# Patient Record
Sex: Male | Born: 1950
Health system: Southern US, Community
[De-identification: ages and names within clinical notes are randomized; demographics above are authoritative.]

## PROBLEM LIST (undated history)

## (undated) DIAGNOSIS — I10 Essential (primary) hypertension: Secondary | ICD-10-CM

## (undated) DIAGNOSIS — K5790 Diverticulosis of intestine, part unspecified, without perforation or abscess without bleeding: Secondary | ICD-10-CM

## (undated) DIAGNOSIS — K219 Gastro-esophageal reflux disease without esophagitis: Secondary | ICD-10-CM

## (undated) DIAGNOSIS — IMO0002 Reserved for concepts with insufficient information to code with codable children: Secondary | ICD-10-CM

## (undated) DIAGNOSIS — K559 Vascular disorder of intestine, unspecified: Secondary | ICD-10-CM

## (undated) DIAGNOSIS — B351 Tinea unguium: Secondary | ICD-10-CM

## (undated) DIAGNOSIS — R002 Palpitations: Secondary | ICD-10-CM

## (undated) DIAGNOSIS — I2510 Atherosclerotic heart disease of native coronary artery without angina pectoris: Secondary | ICD-10-CM

## (undated) DIAGNOSIS — E785 Hyperlipidemia, unspecified: Secondary | ICD-10-CM

## (undated) DIAGNOSIS — E663 Overweight: Secondary | ICD-10-CM

## (undated) DIAGNOSIS — R011 Cardiac murmur, unspecified: Secondary | ICD-10-CM

## (undated) DIAGNOSIS — K635 Polyp of colon: Secondary | ICD-10-CM

## (undated) DIAGNOSIS — R9430 Abnormal result of cardiovascular function study, unspecified: Secondary | ICD-10-CM

## (undated) DIAGNOSIS — M1990 Unspecified osteoarthritis, unspecified site: Secondary | ICD-10-CM

## (undated) DIAGNOSIS — Z5189 Encounter for other specified aftercare: Secondary | ICD-10-CM

## (undated) DIAGNOSIS — I340 Nonrheumatic mitral (valve) insufficiency: Secondary | ICD-10-CM

## (undated) HISTORY — DX: Hyperlipidemia, unspecified: E78.5

## (undated) HISTORY — DX: Reserved for concepts with insufficient information to code with codable children: IMO0002

## (undated) HISTORY — DX: Tinea unguium: B35.1

## (undated) HISTORY — PX: CARDIAC CATHETERIZATION: SHX172

## (undated) HISTORY — DX: Gastro-esophageal reflux disease without esophagitis: K21.9

## (undated) HISTORY — DX: Diverticulosis of intestine, part unspecified, without perforation or abscess without bleeding: K57.90

## (undated) HISTORY — DX: Encounter for other specified aftercare: Z51.89

## (undated) HISTORY — DX: Unspecified osteoarthritis, unspecified site: M19.90

## (undated) HISTORY — DX: Polyp of colon: K63.5

## (undated) HISTORY — DX: Cardiac murmur, unspecified: R01.1

## (undated) HISTORY — DX: Vascular disorder of intestine, unspecified: K55.9

## (undated) HISTORY — DX: Essential (primary) hypertension: I10

## (undated) HISTORY — DX: Abnormal result of cardiovascular function study, unspecified: R94.30

## (undated) HISTORY — DX: Atherosclerotic heart disease of native coronary artery without angina pectoris: I25.10

## (undated) HISTORY — DX: Overweight: E66.3

## (undated) HISTORY — DX: Nonrheumatic mitral (valve) insufficiency: I34.0

## (undated) HISTORY — DX: Palpitations: R00.2

---

## 2002-09-09 ENCOUNTER — Ambulatory Visit (HOSPITAL_COMMUNITY): Admission: AD | Admit: 2002-09-09 | Discharge: 2002-09-10 | Payer: Self-pay | Admitting: Cardiovascular Disease

## 2002-09-09 HISTORY — PX: ANGIOPLASTY / STENTING FEMORAL: SUR30

## 2002-11-02 ENCOUNTER — Encounter (INDEPENDENT_AMBULATORY_CARE_PROVIDER_SITE_OTHER): Payer: Self-pay | Admitting: Specialist

## 2002-11-02 ENCOUNTER — Ambulatory Visit (HOSPITAL_COMMUNITY): Admission: RE | Admit: 2002-11-02 | Discharge: 2002-11-02 | Payer: Self-pay | Admitting: Gastroenterology

## 2004-04-25 ENCOUNTER — Ambulatory Visit: Payer: Self-pay | Admitting: Pulmonary Disease

## 2004-05-02 ENCOUNTER — Encounter: Admission: RE | Admit: 2004-05-02 | Discharge: 2004-05-02 | Payer: Self-pay | Admitting: Orthopaedic Surgery

## 2004-11-05 ENCOUNTER — Inpatient Hospital Stay (HOSPITAL_COMMUNITY): Admission: EM | Admit: 2004-11-05 | Discharge: 2004-11-07 | Payer: Self-pay | Admitting: *Deleted

## 2004-11-06 ENCOUNTER — Ambulatory Visit: Payer: Self-pay | Admitting: Gastroenterology

## 2004-11-06 ENCOUNTER — Encounter (INDEPENDENT_AMBULATORY_CARE_PROVIDER_SITE_OTHER): Payer: Self-pay | Admitting: *Deleted

## 2004-11-07 ENCOUNTER — Ambulatory Visit: Payer: Self-pay | Admitting: Pulmonary Disease

## 2004-11-14 ENCOUNTER — Ambulatory Visit: Payer: Self-pay | Admitting: Pulmonary Disease

## 2004-12-05 ENCOUNTER — Ambulatory Visit: Payer: Self-pay | Admitting: Gastroenterology

## 2004-12-08 ENCOUNTER — Ambulatory Visit (HOSPITAL_COMMUNITY): Admission: RE | Admit: 2004-12-08 | Discharge: 2004-12-08 | Payer: Self-pay | Admitting: Gastroenterology

## 2004-12-26 ENCOUNTER — Ambulatory Visit: Payer: Self-pay | Admitting: Pulmonary Disease

## 2004-12-29 ENCOUNTER — Ambulatory Visit: Payer: Self-pay | Admitting: Cardiology

## 2005-06-26 ENCOUNTER — Ambulatory Visit: Payer: Self-pay | Admitting: Pulmonary Disease

## 2006-10-14 ENCOUNTER — Ambulatory Visit: Payer: Self-pay | Admitting: Cardiology

## 2006-10-14 LAB — CONVERTED CEMR LAB
BUN: 10 mg/dL (ref 6–23)
Basophils Absolute: 0 10*3/uL (ref 0.0–0.1)
Basophils Relative: 0.1 % (ref 0.0–1.0)
CO2: 30 meq/L (ref 19–32)
Calcium: 9.4 mg/dL (ref 8.4–10.5)
Chloride: 103 meq/L (ref 96–112)
Creatinine, Ser: 0.7 mg/dL (ref 0.4–1.5)
Eosinophils Absolute: 0.2 10*3/uL (ref 0.0–0.6)
Eosinophils Relative: 2.1 % (ref 0.0–5.0)
GFR calc Af Amer: 149 mL/min
GFR calc non Af Amer: 124 mL/min
Glucose, Bld: 95 mg/dL (ref 70–99)
HCT: 42.1 % (ref 39.0–52.0)
Hemoglobin: 14.5 g/dL (ref 13.0–17.0)
INR: 1 (ref 0.9–2.0)
Lymphocytes Relative: 24.9 % (ref 12.0–46.0)
MCHC: 34.5 g/dL (ref 30.0–36.0)
MCV: 86.6 fL (ref 78.0–100.0)
Monocytes Absolute: 0.6 10*3/uL (ref 0.2–0.7)
Monocytes Relative: 7.1 % (ref 3.0–11.0)
Neutro Abs: 5.4 10*3/uL (ref 1.4–7.7)
Neutrophils Relative %: 65.8 % (ref 43.0–77.0)
Platelets: 319 10*3/uL (ref 150–400)
Potassium: 4.6 meq/L (ref 3.5–5.1)
Prothrombin Time: 12 s (ref 10.0–14.0)
RBC: 4.87 M/uL (ref 4.22–5.81)
RDW: 12.8 % (ref 11.5–14.6)
Sodium: 141 meq/L (ref 135–145)
WBC: 8.3 10*3/uL (ref 4.5–10.5)
aPTT: 30.9 s (ref 26.5–36.5)

## 2006-10-16 ENCOUNTER — Inpatient Hospital Stay (HOSPITAL_BASED_OUTPATIENT_CLINIC_OR_DEPARTMENT_OTHER): Admission: RE | Admit: 2006-10-16 | Discharge: 2006-10-16 | Payer: Self-pay | Admitting: Cardiology

## 2006-10-16 ENCOUNTER — Ambulatory Visit: Payer: Self-pay | Admitting: Cardiology

## 2006-11-07 ENCOUNTER — Ambulatory Visit: Payer: Self-pay | Admitting: Cardiology

## 2007-04-14 ENCOUNTER — Ambulatory Visit: Payer: Self-pay | Admitting: Pulmonary Disease

## 2007-04-14 DIAGNOSIS — E785 Hyperlipidemia, unspecified: Secondary | ICD-10-CM

## 2007-04-14 DIAGNOSIS — J209 Acute bronchitis, unspecified: Secondary | ICD-10-CM

## 2007-04-14 DIAGNOSIS — M1990 Unspecified osteoarthritis, unspecified site: Secondary | ICD-10-CM

## 2007-04-17 ENCOUNTER — Telehealth: Payer: Self-pay | Admitting: Pulmonary Disease

## 2007-04-17 ENCOUNTER — Encounter: Payer: Self-pay | Admitting: Pulmonary Disease

## 2007-05-19 ENCOUNTER — Ambulatory Visit: Payer: Self-pay | Admitting: Pulmonary Disease

## 2007-05-23 ENCOUNTER — Ambulatory Visit: Payer: Self-pay | Admitting: Pulmonary Disease

## 2007-06-01 DIAGNOSIS — E663 Overweight: Secondary | ICD-10-CM

## 2007-06-01 LAB — CONVERTED CEMR LAB
ALT: 89 units/L — ABNORMAL HIGH (ref 0–53)
AST: 52 units/L — ABNORMAL HIGH (ref 0–37)
Albumin: 4 g/dL (ref 3.5–5.2)
Alkaline Phosphatase: 73 units/L (ref 39–117)
BUN: 10 mg/dL (ref 6–23)
Basophils Absolute: 0.1 10*3/uL (ref 0.0–0.1)
Basophils Relative: 1 % (ref 0.0–1.0)
Bilirubin, Direct: 0.2 mg/dL (ref 0.0–0.3)
CO2: 32 meq/L (ref 19–32)
Calcium: 9.4 mg/dL (ref 8.4–10.5)
Chloride: 101 meq/L (ref 96–112)
Cholesterol: 181 mg/dL (ref 0–200)
Creatinine, Ser: 0.7 mg/dL (ref 0.4–1.5)
Eosinophils Absolute: 0.3 10*3/uL (ref 0.0–0.6)
Eosinophils Relative: 4 % (ref 0.0–5.0)
GFR calc Af Amer: 149 mL/min
GFR calc non Af Amer: 123 mL/min
Glucose, Bld: 140 mg/dL — ABNORMAL HIGH (ref 70–99)
HCT: 41.3 % (ref 39.0–52.0)
HDL: 36.3 mg/dL — ABNORMAL LOW (ref >39.0)
Hemoglobin: 14.1 g/dL (ref 13.0–17.0)
Hgb A1c MFr Bld: 7 % — ABNORMAL HIGH (ref 4.6–6.0)
LDL Cholesterol: 112 mg/dL — ABNORMAL HIGH (ref 0–99)
Lymphocytes Relative: 26.5 % (ref 12.0–46.0)
MCHC: 34 g/dL (ref 30.0–36.0)
MCV: 86.7 fL (ref 78.0–100.0)
Monocytes Absolute: 0.9 10*3/uL — ABNORMAL HIGH (ref 0.2–0.7)
Monocytes Relative: 13.2 % — ABNORMAL HIGH (ref 3.0–11.0)
Neutro Abs: 3.8 10*3/uL (ref 1.4–7.7)
Neutrophils Relative %: 55.3 % (ref 43.0–77.0)
PSA: 1.41 ng/mL (ref 0.10–4.00)
Platelets: 296 10*3/uL (ref 150–400)
Potassium: 4.4 meq/L (ref 3.5–5.1)
RBC: 4.77 M/uL (ref 4.22–5.81)
RDW: 12.9 % (ref 11.5–14.6)
Sodium: 137 meq/L (ref 135–145)
TSH: 3.2 microintl units/mL (ref 0.35–5.50)
Total Bilirubin: 0.8 mg/dL (ref 0.3–1.2)
Total CHOL/HDL Ratio: 5
Total Protein: 6.4 g/dL (ref 6.0–8.3)
Triglycerides: 166 mg/dL — ABNORMAL HIGH (ref 0–149)
VLDL: 33 mg/dL (ref 0–40)
WBC: 7 10*3/uL (ref 4.5–10.5)

## 2007-06-17 ENCOUNTER — Ambulatory Visit: Payer: Self-pay | Admitting: Pulmonary Disease

## 2007-06-17 LAB — CONVERTED CEMR LAB
ALT: 66 units/L — ABNORMAL HIGH (ref 0–53)
AST: 38 units/L — ABNORMAL HIGH (ref 0–37)
Albumin: 4 g/dL (ref 3.5–5.2)
Alkaline Phosphatase: 67 units/L (ref 39–117)
BUN: 15 mg/dL (ref 6–23)
Bilirubin, Direct: 0.1 mg/dL (ref 0.0–0.3)
CO2: 31 meq/L (ref 19–32)
Calcium: 9.2 mg/dL (ref 8.4–10.5)
Chloride: 104 meq/L (ref 96–112)
Creatinine, Ser: 0.8 mg/dL (ref 0.4–1.5)
GFR calc Af Amer: 128 mL/min
GFR calc non Af Amer: 106 mL/min
Glucose, Bld: 110 mg/dL — ABNORMAL HIGH (ref 70–99)
Potassium: 4.4 meq/L (ref 3.5–5.1)
Sodium: 140 meq/L (ref 135–145)
Total Bilirubin: 0.7 mg/dL (ref 0.3–1.2)
Total Protein: 6.8 g/dL (ref 6.0–8.3)

## 2007-07-30 ENCOUNTER — Telehealth: Payer: Self-pay | Admitting: Pulmonary Disease

## 2007-10-08 ENCOUNTER — Ambulatory Visit: Payer: Self-pay | Admitting: Pulmonary Disease

## 2007-10-13 ENCOUNTER — Ambulatory Visit: Payer: Self-pay | Admitting: Pulmonary Disease

## 2007-10-22 LAB — CONVERTED CEMR LAB
ALT: 30 units/L (ref 0–53)
AST: 22 units/L (ref 0–37)
Albumin: 4 g/dL (ref 3.5–5.2)
Alkaline Phosphatase: 60 units/L (ref 39–117)
BUN: 15 mg/dL (ref 6–23)
Bilirubin, Direct: 0.1 mg/dL (ref 0.0–0.3)
CO2: 32 meq/L (ref 19–32)
Calcium: 9 mg/dL (ref 8.4–10.5)
Chloride: 105 meq/L (ref 96–112)
Cholesterol: 186 mg/dL (ref 0–200)
Creatinine, Ser: 0.8 mg/dL (ref 0.4–1.5)
Direct LDL: 126.8 mg/dL
GFR calc Af Amer: 128 mL/min
GFR calc non Af Amer: 106 mL/min
Glucose, Bld: 147 mg/dL — ABNORMAL HIGH (ref 70–99)
HDL: 37.4 mg/dL — ABNORMAL LOW (ref >39.0)
Hgb A1c MFr Bld: 6.5 % — ABNORMAL HIGH (ref 4.6–6.0)
Potassium: 4 meq/L (ref 3.5–5.1)
Sodium: 141 meq/L (ref 135–145)
Total Bilirubin: 0.8 mg/dL (ref 0.3–1.2)
Total CHOL/HDL Ratio: 5
Total Protein: 6.5 g/dL (ref 6.0–8.3)
Triglycerides: 254 mg/dL (ref 0–149)
VLDL: 51 mg/dL — ABNORMAL HIGH (ref 0–40)

## 2007-11-14 ENCOUNTER — Ambulatory Visit: Payer: Self-pay | Admitting: Gastroenterology

## 2007-11-26 ENCOUNTER — Ambulatory Visit: Payer: Self-pay | Admitting: Gastroenterology

## 2007-11-26 ENCOUNTER — Encounter: Payer: Self-pay | Admitting: Gastroenterology

## 2007-11-26 ENCOUNTER — Encounter (INDEPENDENT_AMBULATORY_CARE_PROVIDER_SITE_OTHER): Payer: Self-pay | Admitting: *Deleted

## 2007-11-26 HISTORY — PX: UPPER GI ENDOSCOPY: SHX6162

## 2007-11-26 HISTORY — PX: COLONOSCOPY: SHX174

## 2007-12-01 ENCOUNTER — Encounter: Payer: Self-pay | Admitting: Gastroenterology

## 2008-01-23 ENCOUNTER — Telehealth (INDEPENDENT_AMBULATORY_CARE_PROVIDER_SITE_OTHER): Payer: Self-pay | Admitting: *Deleted

## 2008-03-09 ENCOUNTER — Ambulatory Visit: Payer: Self-pay | Admitting: Pulmonary Disease

## 2008-03-16 ENCOUNTER — Ambulatory Visit: Payer: Self-pay | Admitting: Pulmonary Disease

## 2008-03-21 LAB — CONVERTED CEMR LAB
ALT: 38 units/L (ref 0–53)
AST: 29 units/L (ref 0–37)
Albumin: 4.1 g/dL (ref 3.5–5.2)
Alkaline Phosphatase: 58 units/L (ref 39–117)
BUN: 16 mg/dL (ref 6–23)
Basophils Absolute: 0 10*3/uL (ref 0.0–0.1)
Basophils Relative: 0.4 % (ref 0.0–3.0)
Bilirubin, Direct: 0.1 mg/dL (ref 0.0–0.3)
CO2: 28 meq/L (ref 19–32)
Calcium: 9.3 mg/dL (ref 8.4–10.5)
Chloride: 106 meq/L (ref 96–112)
Cholesterol: 190 mg/dL (ref 0–200)
Creatinine, Ser: 0.8 mg/dL (ref 0.4–1.5)
Eosinophils Absolute: 0.3 10*3/uL (ref 0.0–0.7)
Eosinophils Relative: 3.8 % (ref 0.0–5.0)
GFR calc Af Amer: 127 mL/min
GFR calc non Af Amer: 105 mL/min
Glucose, Bld: 144 mg/dL — ABNORMAL HIGH (ref 70–99)
HCT: 41.8 % (ref 39.0–52.0)
HDL: 39.4 mg/dL (ref >39.0)
Hemoglobin: 14.9 g/dL (ref 13.0–17.0)
Hgb A1c MFr Bld: 6.4 % — ABNORMAL HIGH (ref 4.6–6.0)
LDL Cholesterol: 127 mg/dL — ABNORMAL HIGH (ref 0–99)
Lymphocytes Relative: 21.6 % (ref 12.0–46.0)
MCHC: 35.6 g/dL (ref 30.0–36.0)
MCV: 86.1 fL (ref 78.0–100.0)
Monocytes Absolute: 0.9 10*3/uL (ref 0.1–1.0)
Monocytes Relative: 12.1 % — ABNORMAL HIGH (ref 3.0–12.0)
Neutro Abs: 4.8 10*3/uL (ref 1.4–7.7)
Neutrophils Relative %: 62.1 % (ref 43.0–77.0)
PSA: 1.29 ng/mL (ref 0.10–4.00)
Platelets: 284 10*3/uL (ref 150–400)
Potassium: 4.4 meq/L (ref 3.5–5.1)
RBC: 4.86 M/uL (ref 4.22–5.81)
RDW: 12.6 % (ref 11.5–14.6)
Sodium: 141 meq/L (ref 135–145)
TSH: 1.89 microintl units/mL (ref 0.35–5.50)
Total Bilirubin: 0.8 mg/dL (ref 0.3–1.2)
Total CHOL/HDL Ratio: 4.8
Total Protein: 7 g/dL (ref 6.0–8.3)
Triglycerides: 118 mg/dL (ref 0–149)
VLDL: 24 mg/dL (ref 0–40)
WBC: 7.7 10*3/uL (ref 4.5–10.5)

## 2008-06-22 ENCOUNTER — Telehealth (INDEPENDENT_AMBULATORY_CARE_PROVIDER_SITE_OTHER): Payer: Self-pay | Admitting: *Deleted

## 2008-09-07 ENCOUNTER — Ambulatory Visit: Payer: Self-pay | Admitting: Pulmonary Disease

## 2008-09-07 LAB — CONVERTED CEMR LAB
ALT: 32 units/L (ref 0–53)
AST: 27 units/L (ref 0–37)
Albumin: 4.2 g/dL (ref 3.5–5.2)
Alkaline Phosphatase: 65 units/L (ref 39–117)
BUN: 16 mg/dL (ref 6–23)
Basophils Absolute: 0.1 10*3/uL (ref 0.0–0.1)
Basophils Relative: 0.8 % (ref 0.0–3.0)
Bilirubin, Direct: 0.2 mg/dL (ref 0.0–0.3)
CO2: 32 meq/L (ref 19–32)
Calcium: 9.3 mg/dL (ref 8.4–10.5)
Chloride: 102 meq/L (ref 96–112)
Cholesterol: 179 mg/dL (ref 0–200)
Creatinine, Ser: 0.9 mg/dL (ref 0.4–1.5)
Eosinophils Absolute: 0.2 10*3/uL (ref 0.0–0.7)
Eosinophils Relative: 2.8 % (ref 0.0–5.0)
GFR calc non Af Amer: 91.55 mL/min (ref >60)
Glucose, Bld: 126 mg/dL — ABNORMAL HIGH (ref 70–99)
HCT: 41.8 % (ref 39.0–52.0)
HDL: 48.1 mg/dL (ref >39.00)
Hemoglobin: 14.7 g/dL (ref 13.0–17.0)
Hgb A1c MFr Bld: 6.2 % (ref 4.6–6.5)
LDL Cholesterol: 107 mg/dL — ABNORMAL HIGH (ref 0–99)
Lymphocytes Relative: 20 % (ref 12.0–46.0)
Lymphs Abs: 1.6 10*3/uL (ref 0.7–4.0)
MCHC: 35.1 g/dL (ref 30.0–36.0)
MCV: 86.6 fL (ref 78.0–100.0)
Monocytes Absolute: 0.8 10*3/uL (ref 0.1–1.0)
Monocytes Relative: 9.3 % (ref 3.0–12.0)
Neutro Abs: 5.4 10*3/uL (ref 1.4–7.7)
Neutrophils Relative %: 67.1 % (ref 43.0–77.0)
Platelets: 267 10*3/uL (ref 150.0–400.0)
Potassium: 4.6 meq/L (ref 3.5–5.1)
RBC: 4.83 M/uL (ref 4.22–5.81)
RDW: 12.9 % (ref 11.5–14.6)
Sed Rate: 15 mm/hr (ref 0–22)
Sodium: 142 meq/L (ref 135–145)
Total Bilirubin: 1 mg/dL (ref 0.3–1.2)
Total CHOL/HDL Ratio: 4
Total Protein: 7.1 g/dL (ref 6.0–8.3)
Triglycerides: 121 mg/dL (ref 0.0–149.0)
VLDL: 24.2 mg/dL (ref 0.0–40.0)
WBC: 8.1 10*3/uL (ref 4.5–10.5)

## 2008-09-08 ENCOUNTER — Telehealth: Payer: Self-pay | Admitting: Pulmonary Disease

## 2008-09-14 ENCOUNTER — Ambulatory Visit (HOSPITAL_COMMUNITY): Admission: RE | Admit: 2008-09-14 | Discharge: 2008-09-14 | Payer: Self-pay | Admitting: Pulmonary Disease

## 2008-09-21 ENCOUNTER — Telehealth (INDEPENDENT_AMBULATORY_CARE_PROVIDER_SITE_OTHER): Payer: Self-pay | Admitting: *Deleted

## 2008-11-02 ENCOUNTER — Telehealth: Payer: Self-pay | Admitting: Pulmonary Disease

## 2008-11-19 ENCOUNTER — Ambulatory Visit: Payer: Self-pay | Admitting: Pulmonary Disease

## 2008-11-19 LAB — CONVERTED CEMR LAB: Streptococcus, Group A Screen (Direct): NEGATIVE

## 2009-06-01 ENCOUNTER — Telehealth (INDEPENDENT_AMBULATORY_CARE_PROVIDER_SITE_OTHER): Payer: Self-pay | Admitting: *Deleted

## 2009-10-18 ENCOUNTER — Telehealth (INDEPENDENT_AMBULATORY_CARE_PROVIDER_SITE_OTHER): Payer: Self-pay | Admitting: *Deleted

## 2009-10-19 ENCOUNTER — Ambulatory Visit: Payer: Self-pay | Admitting: Pulmonary Disease

## 2009-11-10 ENCOUNTER — Telehealth (INDEPENDENT_AMBULATORY_CARE_PROVIDER_SITE_OTHER): Payer: Self-pay | Admitting: *Deleted

## 2010-01-24 ENCOUNTER — Ambulatory Visit: Payer: Self-pay | Admitting: Pulmonary Disease

## 2010-01-30 LAB — CONVERTED CEMR LAB
ALT: 49 units/L (ref 0–53)
AST: 33 units/L (ref 0–37)
Albumin: 4.6 g/dL (ref 3.5–5.2)
Alkaline Phosphatase: 58 units/L (ref 39–117)
BUN: 14 mg/dL (ref 6–23)
Bilirubin, Direct: 0.1 mg/dL (ref 0.0–0.3)
CO2: 31 meq/L (ref 19–32)
Calcium: 9.5 mg/dL (ref 8.4–10.5)
Chloride: 100 meq/L (ref 96–112)
Creatinine, Ser: 0.8 mg/dL (ref 0.4–1.5)
GFR calc non Af Amer: 101.46 mL/min (ref >60)
Glucose, Bld: 108 mg/dL — ABNORMAL HIGH (ref 70–99)
Hgb A1c MFr Bld: 6.3 % (ref 4.6–6.5)
Iron: 90 ug/dL (ref 42–165)
PSA: 1.82 ng/mL (ref 0.10–4.00)
Potassium: 4.6 meq/L (ref 3.5–5.1)
Saturation Ratios: 19.3 % — ABNORMAL LOW (ref 20.0–50.0)
Sodium: 140 meq/L (ref 135–145)
TSH: 1.89 microintl units/mL (ref 0.35–5.50)
Total Bilirubin: 0.7 mg/dL (ref 0.3–1.2)
Total Protein: 7.2 g/dL (ref 6.0–8.3)
Transferrin: 333.1 mg/dL (ref 212.0–360.0)

## 2010-03-16 ENCOUNTER — Telehealth (INDEPENDENT_AMBULATORY_CARE_PROVIDER_SITE_OTHER): Payer: Self-pay | Admitting: *Deleted

## 2010-04-04 NOTE — Assessment & Plan Note (Signed)
Summary: Acute NP office visit - n/v/d   CC:  n/v/d, chills, sweats, and foul taste in mouth x3days - states has been able to keep fluids down.  History of Present Illness: 60 year old white male patient of Dr. Kriste Basque with known history of HTN, GERD, hyperlipidemia   October 19, 2009--Presents for an acute office visit. Complains of nause and loose stools,  chills, sweats, foul taste in mouth x3days - states has been able to keep fluids down. He says few hours after eating roast beef sandwich his stomach felt upset. Has had multiple watery stools, nausea, decreased appetite. Has been drinking alot of fluids. Eating very small amounts of bland food, mashed potato, soups. Denies abdominal pain, bloody stools, urinary symptoms, recent travel or abx use. Denies chest pain, dyspnea, orthopnea, hemoptysis, fever,  edema, headache.   .    Medications Prior to Update: 1)  Bayer Aspirin 325 Mg  Tabs (Aspirin) .... Take 1 Tablet By Mouth Once A Day 2)  Nitroquick 0.4 Mg  Subl (Nitroglycerin) .... As Needed 3)  Metoprolol Tartrate 50 Mg Tabs (Metoprolol Tartrate) .... Take 1 Tab By Mouth Two Times A Day... 4)  Zocor 80 Mg  Tabs (Simvastatin) .... Take One Tablet By Mouth At Bedtime 5)  Metformin Hcl 500 Mg Tabs (Metformin Hcl) .... Take 1  Tab By Mouth Two Times A Day... 6)  Pantoprazole Sodium 40 Mg Tbec (Pantoprazole Sodium) .... Take 1 Tablet By Mouth Two Times A Day 7)  Multivitamins   Tabs (Multiple Vitamin) .... Take 1 Tablet By Mouth Once A Day 8)  Nova Max Glucose Test   Strp (Glucose Blood) .... Check Bs 2 Times  Daily Dx-250.00 9)  Darvocet-N 100 100-650 Mg Tabs (Propoxyphene N-Apap) .... Take 1 Tab By Mouth Every 6 H As Needed For Pain... 10)  Augmentin 875-125 Mg Tabs (Amoxicillin-Pot Clavulanate) .Marland Kitchen.. 1 By Mouth Two Times A Day 11)  Prednisone 10 Mg Tabs (Prednisone) .... 4 Tabs For 2 Days, Then 3 Tabs For 2 Days, 2 Tabs For 2 Days, Then 1 Tab For 2 Days, Then Stop 12)  Zithromax Z-Pak 250 Mg  Tabs (Azithromycin) .... Take As Directed 13)  Magic Mouthwash .... 1 Tsp Gargle and Swallow Four Times A Day  Current Medications (verified): 1)  Bayer Aspirin 325 Mg  Tabs (Aspirin) .... Take 1 Tablet By Mouth Once A Day 2)  Nitrostat 0.4 Mg Subl (Nitroglycerin) .Marland Kitchen.. 1 Under Tongue Every 5 Minutes X3 As Needed Chest Pain 3)  Metoprolol Tartrate 50 Mg Tabs (Metoprolol Tartrate) .... Take 1 Tab By Mouth Two Times A Day... 4)  Zocor 80 Mg  Tabs (Simvastatin) .... Take One Tablet By Mouth At Bedtime 5)  Metformin Hcl 500 Mg Tabs (Metformin Hcl) .... Take 1  Tab By Mouth Two Times A Day... 6)  Pantoprazole Sodium 40 Mg Tbec (Pantoprazole Sodium) .... Take 1 Tablet By Mouth Two Times A Day 7)  Multivitamins   Tabs (Multiple Vitamin) .... Take 1 Tablet By Mouth Once A Day 8)  Nova Max Glucose Test   Strp (Glucose Blood) .... Check Bs 2 Times  Daily Dx-250.00  Allergies (verified): No Known Drug Allergies  Past History:  Past Medical History: Last updated: 11/19/2008 BRONCHITIS, ACUTE (ICD-466.0) - he is an ex-smoker, quit  ~56yrs... no recent problems and the exercise program has really helped...  HYPERTENSION (ICD-401.9) - on METOPROLOL 50mg Bid,    CORONARY ARTERY DISEASE (ICD-414.00) - on ASA 325mg /d... followed by Delton See & his  last note 9/08 is reviewed... he has done better recently w/ secondary risk factor reduction...  ~  NuclearStressTest 7/04 showed ?anteroseptal scar vs thinning, no ischemia, EF=61%...  ~  initial cath 7/04 w/ 80% LAD lesion... stent placed by DrStuckey...  ~  last cath 8/08 showed non-obstructive CAD & prev stent site w/ <10% narrowing, EF=65%...     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 Yes Comments Medications reviewed with patient Daytime contact number verified with patient. Boone Master CNA/MA  October 19, 2009 11:40 AM    Physical Exam  Additional Exam:  WD, Overweight, 60  y/o WM in NAD...  GENERAL:  Alert & oriented; pleasant & cooperative... HEENT:  [place]/AT, EOM-wnl, PERRLA, EACs-clear, TMs-wnl, NOSE-clear, THROAT-clear & wnl. NECK:  Supple w/ fairROM; no JVD; normal carotid impulses w/o bruits; no thyromegaly or nodules palpated; no lymphadenopathy. CHEST:  Coarse BS w/ few wheezes  HEART:  Regular Rhythm;  gr 1/6 SEM without rubs or gallops detected... ABDOMEN:  Soft & min tender in RUQ; normal bowel sounds; no organomegaly or masses palpated., no guarding or rebound, neg murphys sign,  EXT: without deformities, mild arthritic changes; no varicose veins/ venous insuffic/ or edema.     Impression & Recommendations:  Problem # 1:  DIARRHEA (ICD-787.91)  Acute diarrhea ? food related vs viral  He does not appear toxic, he is keeping food/fluids down.  will tx w/ empiric abx for now Plan: Advance bland diet as tolerated.  Avoid lactose for 5 days  Immodium AD x 1 dose. Cipro 500mg  two times a day x 3 days Increase fluids. and rest.  Please contact office for sooner follow up if symptoms do not improve or worsen    Orders: Est. Patient  Level IV (16109)  Medications Added to Medication List This Visit: 1)  Nitrostat 0.4 Mg Subl (Nitroglycerin) .Marland Kitchen.. 1 under tongue every 5 minutes x3 as needed chest pain 2)  Ciprofloxacin Hcl 500 Mg Tabs (Ciprofloxacin hcl) .Marland Kitchen.. 1 by mouth two times a day x 3 days 3)  Zofran 4 Mg Tabs (Ondansetron hcl) .Marland Kitchen.. 1 tab by mouth every 4-6 hours as needed nause  Complete Medication List: 1)  Bayer Aspirin 325 Mg Tabs (Aspirin) .... Take 1 tablet by mouth once a day 2)  Nitrostat 0.4 Mg Subl (Nitroglycerin) .Marland Kitchen.. 1 under tongue every 5 minutes  x3 as needed chest pain 3)  Metoprolol Tartrate 50 Mg Tabs (Metoprolol tartrate) .... Take 1 tab by mouth two times a day... 4)  Zocor 80 Mg Tabs (Simvastatin) .... Take one tablet by mouth at bedtime 5)  Metformin Hcl 500 Mg Tabs (Metformin hcl) .... Take 1  tab by mouth two times a day... 6)  Pantoprazole Sodium 40 Mg Tbec (Pantoprazole sodium) .... Take 1 tablet by mouth two times a day 7)  Multivitamins Tabs (Multiple vitamin) .... Take 1 tablet by mouth once a day 8)  Nova Max Glucose Test Strp (Glucose blood) .... Check bs 2 times  daily dx-250.00 9)  Ciprofloxacin Hcl 500 Mg Tabs (Ciprofloxacin hcl) .Marland Kitchen.. 1 by mouth two times a day x 3 days 10)  Zofran 4 Mg Tabs (Ondansetron hcl) .Marland Kitchen.. 1 tab by mouth every 4-6 hours as needed nause  Patient Instructions: 1)  Advance bland diet as tolerated.  2)  Avoid lactose for 5 days  3)  Immodium AD x 1 dose. 4)  Cipro 500mg  two times a day x 3 days 5)  Increase fluids. and rest.  6)  Please contact office for sooner follow up if symptoms do not improve or worsen  Prescriptions: ZOFRAN 4 MG TABS (ONDANSETRON HCL) 1 tab by mouth every 4-6 hours as needed nause  #20 x 0   Entered by:   Boone Master CNA/MA   Authorized by:   Rubye Oaks NP   Signed by:   Boone Master CNA/MA on 10/20/2009   Method used:   Electronically to        [facility] (retail)       [address]       [place], [place]  [address]       Ph: [phone]       Fax: [phone]   RxID:   [id number] CIPROFLOXACIN HCL 500 MG TABS (CIPROFLOXACIN HCL) 1 by mouth two times a day x 3 days  #6 x 0   Entered and Authorized by:   Rubye Oaks NP   Signed by:   Rubye Oaks NP on 10/19/2009   Method used:   Electronically to        [facility] (retail)       [address]       [place], [place]  [address]       Ph: [phone]       Fax: [phone]   RxID:   [id number]    Immunization History:  Influenza Immunization History:    Influenza:  historical (12/03/2008)

## 2010-04-04 NOTE — Assessment & Plan Note (Signed)
Summary: rov/apc   CC:  16 moth ROV & review of mult medical problems....  History of Present Illness: 60 y/o WM here for a follow up visit... he has multiple medical problems as noted below...     ~  he has a hx of HBP and CAD w/ stent and is followed for cardiology by [facility]... he has hypercholesterolemia on Simva80... he was diagnosed w/ DM 3/09 and started on Metformin- he has since lost some weight on diet & exercise program!!!   ~  September 07, 2008:  he's had a good 6 mo- lost 10# back to 198# today... but he's notes some right lower CWP vs RUQ discomfort- not activ related, not food or intake related, & primarily occurs at bedtime.Marland Kitchen denies N/ V, change in bowels, blood etc... we discussed f/u labs (NAD- BS126, A1c6.2), Abd Sonar (fatty liver, norm GB), Rx w/ heat/ DCN100...   ~  January 24, 2010:  it's been 16months since his last check & he feels that he is stable but notes sl weight gain & sugars are up in the 200 range he says... BP controlled on meds;  no CP, palpit, SOB, etc;  he is still on Simva80 & not fasting today- we discussed ch to [id number] & ret for FLP later;  GI stable on Protonix Bid & he will be due for f/u colonoscopy 9/12;  we discussed his CAD, Chol, DM & need for more regular f/u visits.    Current Problems:   BRONCHITIS, ACUTE (ICD-466.0) - he is an ex-smoker, quit  ~53yrs... no recent problems and the exercise program has really helped...  HYPERTENSION (ICD-401.9) - on METOPROLOL 50mg Bid, and BP=132/78 today & similar at home... 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 denies HA, fatigue, visual changes, CP, palipit, dizziness, syncope, dyspnea, edema, etc...  CORONARY ARTERY DISEASE (ICD-414.00) - on ASA 325mg /d... followed by Delton See & his last note 9/08 is reviewed... he has done better recently w/ secondary risk factor reduction...  ~  NuclearStressTest 7/04 showed ?anteroseptal scar vs thinning, no ischemia, EF=61%...  ~  initial cath 7/04  w/ 80% LAD lesion... stent placed by DrStuckey...  ~  last cath 8/08 showed non-obstructive CAD & prev stent site w/ <10% narrowing, EF=65%...  ~  he continues asymptomatic w/o CP, palpit, SOB, edema, etc...  HYPERLIPIDEMIA (ICD-272.4) - on SIMVASTATIN 80mg /d... he stopped his prev Fish Oil supplements ("they gave me gas")...   ~  FLP 4/07 on diet showed TChol 271, TG 443, HDL 31, LDL 155... Rec ~ restart ZOCOR 80...  ~  FLP 3/09 on Simva80 showed TChol 181, TG 166, HDL 36, LDL 112... same med & get wt down (then lost 14#)  ~  FLP 8/09 on Simva80 showed TChol 186, TG 254, HDL 37, LDL 127... rec- same med, better diet, may need Fibrate etc.  ~  FLP 1/10 on Simva80 showed TChol 190, TG 118, HDL 39, LDL 127... rec> get wt down!  ~  FLP 7/10 on Simva80 showed TChol 179, TG 121, HDL 48, LDL 107  ~  11/11: not fasting for labs today & we decided to switch to LIPITOR 40mg /d.  DIABETES MELLITUS, BORDERLINE (ICD-790.29) - started meds 3/09 w/ METFORMIN 500- now on 1Bid.  ~  labs 4/07 showed FBS=134, HgA1c=6.0.Marland KitchenMarland Kitchen  ~  labs 3/09 showed BS= 140, HgA1c= 7.0.Marland KitchenMarland Kitchen Metformin started  ~  labs 8/09 on Metform500Bid showed BS= 147, HgA1c= 6.5  ~  labs 1/10 on Metform500Bid showed BS=  144, A1c= 6.4.Marland Kitchen. rec> keep same.  ~  labs 7/10 on Metform500Bid showed BS= 126, A1c= 6.2  ~  labs 11/11 on Metform500Bid showed BS= 108, A1c= 6.3  OVERWEIGHT (ICD-278.02) - started at 212# Mar09...  ~  8/09:  great job w/ diet and exercise program and weight down 14# to 198#  ~  1/10:  unfortunately weight back up to 208# after the holidays.  ~  7/10: weight down to 198#  ~  11/11: weight = 202#  Hx of GERD (ICD-530.81) - hx gastritis on EGD by [facility] 8/04... he takes NEXIUM 40mg /d, was changed to PROTONIX 40mg  Bid to save $$$ w/ his insurance...  DIVERTICULOSIS OF COLON (ICD-562.10) - colonoscopy 9/06 by Dorris Singh showed ischemic colitis- he needs a full diagnostic colonoscopy 60 y/o Bro had colon cancer...  ~  f/u  colonoscopy 9/09 by DrPatterson showed divertics, polyp= polypoid mucosa w/o adenomatous change... f/u planned 61yrs.  Hx of ISCHEMIC COLITIS (ICD-557.9) - hospitalized 9/06 w/ lower GIB secondary to ischemic colitis (required 2u transfusion)...   DEGENERATIVE JOINT DISEASE (ICD-715.90) & ?CTS w/ wrist pain> wrist splint Rx Qhs helps...  ONYCHOMYCOSIS (ICD-110.1) - Mar09 notes nail fungus on left hand and both feet... LAMISIL 250mg /d started... hands resolved now and toes are improved.   Preventive Screening-Counseling & Management  Alcohol-Tobacco     Smoking Status: quit     Year Quit: 18 yrs ago     Pack years: 36yrs, 1ppd  Allergies (verified): No Known Drug Allergies  Comments:  Nurse/Medical Assistant: The patient's medications and allergies were reviewed with the patient and were updated in the Medication and Allergy Lists.  Past History:  Past Medical History: BRONCHITIS, ACUTE (ICD-466.0) HYPERTENSION (ICD-401.9) CORONARY ARTERY DISEASE (ICD-414.00) HYPERLIPIDEMIA (ICD-272.4) DIABETES MELLITUS, BORDERLINE (ICD-790.29) OVERWEIGHT (ICD-278.02) Hx of GERD (ICD-530.81) DIVERTICULOSIS OF COLON (ICD-562.10) Hx of ISCHEMIC COLITIS (ICD-557.9) DEGENERATIVE JOINT DISEASE (ICD-715.90) ONYCHOMYCOSIS (ICD-110.1)  Family History: Reviewed history from 10/08/2007 and no changes required. mother deceased age 30 from CHF father deceased age 13 w/ DM, ASHD w/ CABG 1 Bro alive age 58 w/ colon cancer 1 Sis alive age 70 in good health 1 Bro alive age 70 w/ prev AAA repair  Social History: Reviewed history from 03/09/2008 and no changes required. 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  Review of Systems  The patient denies fever, chills, sweats, anorexia, fatigue, weakness, malaise, weight loss, sleep disorder, blurring, diplopia, eye irritation, eye discharge, vision loss, eye pain, photophobia, earache, ear  discharge, tinnitus, decreased hearing, nasal congestion, nosebleeds, sore throat, hoarseness, chest pain, palpitations, syncope, dyspnea on exertion, orthopnea, PND, peripheral edema, cough, dyspnea at rest, excessive sputum, hemoptysis, wheezing, pleurisy, nausea, vomiting, diarrhea, constipation, change in bowel habits, abdominal pain, melena, hematochezia, jaundice, gas/bloating, indigestion/heartburn, dysphagia, odynophagia, dysuria, hematuria, urinary frequency, urinary hesitancy, nocturia, incontinence, back pain, joint pain, joint swelling, muscle cramps, muscle weakness, stiffness, arthritis, sciatica, restless legs, leg pain at night, leg pain with exertion, rash, itching, dryness, suspicious lesions, paralysis, paresthesias, seizures, tremors, vertigo, transient blindness, frequent falls, frequent headaches, difficulty walking, depression, anxiety, memory loss, confusion, cold intolerance, heat intolerance, polydipsia, polyphagia, polyuria, unusual weight change, abnormal bruising, bleeding, enlarged lymph nodes, urticaria, allergic rash, hay fever, and recurrent infections.    Vital Signs:  Patient profile:   60 year old male Height:      68 inches Weight:      202.13 pounds BMI:     30.84 O2 Sat:  95 % on Room air Temp:     97.6 degrees F oral Pulse rate:   73 / minute BP sitting:   132 / 78  (left arm) Cuff size:   regular  Vitals Entered By: Randell Loop CMA (January 24, 2010 11:46 AM)  O2 Sat at Rest %:  95 O2 Flow:  Room air CC: 16 moth ROV & review of mult medical problems... Is Patient Diabetic? Yes Pain Assessment Patient in pain? no      Comments meds updated today with pt   Physical Exam  Additional Exam:  WD, Overweight, 60 y/o WM in NAD...  GENERAL:  Alert & oriented; pleasant & cooperative... HEENT:  [place]/AT, EOM-wnl, PERRLA, EACs-clear, TMs-wnl, NOSE-clear, THROAT-clear & wnl. NECK:  Supple w/ fairROM; no JVD; normal carotid impulses w/o bruits; no  thyromegaly or nodules palpated; no lymphadenopathy. CHEST:  Clear to P & A; without wheezes/ rales/ or rhonchi. HEART:  Regular Rhythm;  gr 1/6 SEM without rubs or gallops detected... ABDOMEN:  Soft & min tender in RUQ; normal bowel sounds; no organomegaly or masses palpated... EXT: without deformities, mild arthritic changes; no varicose veins/ venous insuffic/ or edema. NEURO:  CN's intact;  no focal neuro deficits... DERM:  onychomycosis improved as noted...    MISC. Report  Procedure date:  01/24/2010  Findings:      BMP (METABOL)   Sodium                    140 mEq/L                   135-145   Potassium                 4.6 mEq/L                   3.5-5.1   Chloride                  100 mEq/L                   96-112   Carbon Dioxide            31 mEq/L                    19-32   Glucose              [H]  108 mg/dL                   09-81   BUN                       14 mg/dL                    1-91   Creatinine                0.8 mg/dL                   4.7-8.2   Calcium                   9.5 mg/dL                   9.5-62.1   GFR                       101.46 mL/min               >  60  Hepatic/Liver Function Panel (HEPATIC)   Total Bilirubin           0.7 mg/dL                   1.6-1.0   Direct Bilirubin          0.1 mg/dL                   9.6-0.4   Alkaline Phosphatase      58 U/L                      39-117   AST                       33 U/L                      0-37   ALT                       49 U/L                      0-53   Total Protein             7.2 g/dL                    5.4-0.9   Albumin                   4.6 g/dL                    8.1-1.9   IBC Panel (IBC)   Iron                      90 ug/dL                    14-782   Transferrin               333.1 mg/dL                 956.2-130.8   Iron Saturation      [L]  19.3 %                      20.0-50.0   TSH (TSH)   FastTSH                   1.89 uIU/mL                 0.35-5.50  Hemoglobin A1C (A1C)    Hemoglobin A1C            6.3 %                       4.6-6.5  Prostate Specific Antigen (PSA)   PSA-Hyb                   1.82 ng/mL                  0.10-4.00   Impression & Recommendations:  Problem # 1:  HYPERTENSION (ICD-401.9) Controlled on med + diet & exercise... His updated medication list for this problem includes:    Metoprolol Tartrate 50 Mg Tabs (Metoprolol tartrate) .Marland Kitchen... Take 1 tab by mouth two times a day...  Orders: TLB-BMP (Basic Metabolic Panel-BMET) (80048-METABOL) TLB-Hepatic/Liver Function  Pnl (80076-HEPATIC) TLB-IBC Pnl (Iron/FE;Transferrin) (83550-IBC) TLB-TSH (Thyroid Stimulating Hormone) (84443-TSH) TLB-A1C / Hgb A1C (Glycohemoglobin) (83036-A1C) TLB-PSA (Prostate Specific Antigen) (84153-PSA)  Problem # 2:  CORONARY ARTERY DISEASE (ICD-414.00) Stable w/o angina & he is active in his business... His updated medication list for this problem includes:    Bayer Aspirin 325 Mg Tabs (Aspirin) .Marland Kitchen... Take 1 tablet by mouth once a day    Nitrostat 0.4 Mg Subl (Nitroglycerin) .Marland Kitchen... 1 under tongue every 5 minutes x3 as needed chest pain    Metoprolol Tartrate 50 Mg Tabs (Metoprolol tartrate) .Marland Kitchen... Take 1 tab by mouth two times a day...  Problem # 3:  HYPERLIPIDEMIA (ICD-272.4) We decided to switch to Lip40 from the Simva80... he will ret for FLP on this new med. His updated medication list for this problem includes:    Lipitor 40 Mg Tabs (Atorvastatin calcium) .Marland Kitchen... Take 1 tab by mouth once daily...  Problem # 4:  DIABETES MELLITUS, BORDERLINE (ICD-790.29) Historically his control has been good on diet + Metformin, but he hasn't been seen in 60mo & he reports BS>200 at home... labs today showed BS= 108, A1c= 6.3.Marland KitchenMarland Kitchen OK, continue same Rx. His updated medication list for this problem includes:    Metformin Hcl 500 Mg Tabs (Metformin hcl) .Marland Kitchen... Take 1  tab by mouth two times a day...  Problem # 5:  OVERWEIGHT (ICD-278.02) He needs to do a better job w/ diet +  exercise... we discussed wieght goal <170# for this 68" man...  Problem # 6:  Hx of GERD (ICD-530.81) Stable on PPI Bid although he likes the Nexium better... His updated medication list for this problem includes:    Pantoprazole Sodium 40 Mg Tbec (Pantoprazole sodium) .Marland Kitchen... Take 1 tablet by mouth two times a day  Problem # 7:  DIVERTICULOSIS OF COLON (ICD-562.10) He will need f/u colon 9/12 by drPatterson w/ +fam hx colon cancer in bro...  Problem # 8:  OTHER MEDICAL PROBLEMS AS NOTED...  Complete Medication List: 1)  Bayer Aspirin 325 Mg Tabs (Aspirin) .... Take 1 tablet by mouth once a day 2)  Nitrostat 0.4 Mg Subl (Nitroglycerin) .Marland Kitchen.. 1 under tongue every 5 minutes x3 as needed chest pain 3)  Metoprolol Tartrate 50 Mg Tabs (Metoprolol tartrate) .... Take 1 tab by mouth two times a day... 4)  Lipitor 40 Mg Tabs (Atorvastatin calcium) .... Take 1 tab by mouth once daily.Marland KitchenMarland Kitchen 5)  Metformin Hcl 500 Mg Tabs (Metformin hcl) .... Take 1  tab by mouth two times a day... 6)  Pantoprazole Sodium 40 Mg Tbec (Pantoprazole sodium) .... Take 1 tablet by mouth two times a day 7)  Multivitamins Tabs (Multiple vitamin) .... Take 1 tablet by mouth once a day  Patient Instructions: 1)  Today we updated your med list- see below.... 2)  We refilled your meds today... 3)  We decided to change the Simvastatin80 to Lipitor40 due to the new FDA guidelines... 4)  Let's get on track w/ our diet + exercise program... the goal is to lose 20-25 lbs! 5)  If the DM labs return out of control we will adjust the diabetic meds... 6)  Let's plan a follow up visit in  6months w/ FASTING blood work at that time... Prescriptions: PANTOPRAZOLE SODIUM 40 MG TBEC (PANTOPRAZOLE SODIUM) Take 1 tablet by mouth two times a day  #60 x 12   Entered and Authorized by:   Michele Mcalpine MD   Signed by:   Michele Mcalpine MD on  01/24/2010   Method used:   Print then Give to Patient   RxID:   [id number] METFORMIN HCL 500 MG TABS  (METFORMIN HCL) take 1  tab by mouth two times a day...  #60 x 12   Entered and Authorized by:   Michele Mcalpine MD   Signed by:   Michele Mcalpine MD on 01/24/2010   Method used:   Print then Give to Patient   RxID:   [id number] LIPITOR 40 MG TABS (ATORVASTATIN CALCIUM) take 1 tab by mouth once daily...  #30 x 12   Entered and Authorized by:   Michele Mcalpine MD   Signed by:   Michele Mcalpine MD on 01/24/2010   Method used:   Print then Give to Patient   RxID:   [id number] METOPROLOL TARTRATE 50 MG TABS (METOPROLOL TARTRATE) take 1 tab by mouth two times a day...  #60 x 12   Entered and Authorized by:   Michele Mcalpine MD   Signed by:   Michele Mcalpine MD on 01/24/2010   Method used:   Print then Give to Patient   RxID:   [id number] NITROSTAT 0.4 MG SUBL (NITROGLYCERIN) 1 under tongue every 5 minutes x3 as needed chest pain  #25 x prn   Entered and Authorized by:   Michele Mcalpine MD   Signed by:   Michele Mcalpine MD on 01/24/2010   Method used:   Print then Give to Patient   RxID:   [id number]     Appended Document: rov/apc "[place]" message recorded... SN Continue same meds, we ch his simva to Lip today... SN

## 2010-04-04 NOTE — Progress Notes (Signed)
Summary: chest cold  Phone Note Call from Patient   Caller: Patient Call For: nadel Summary of Call: need something for chest cold [facility] Initial call taken by: Rickard Patience,  June 01, 2009 1:15 PM  Follow-up for Phone Call        called and spoke with pt.  pt states symptoms started today.  pt c/o sore throat, coughing up yellow sputum, chest congestion, tightness in chest, "burning in chest when coughing."  Pt denied fever or head congestion.  Please advise.  Thanks  NKDA  Arman Filter LPN  June 01, 2009 1:29 PM   Additional Follow-up for Phone Call Additional follow up Details #1::        per SN----ok to have zpak  #1  tad---mmw  #4oz  1 tsp gargle and swallow four times daily  and use the mucinex max  1 by mouth two times a day with plenty of fluids.  and tylenol as needed .  thanks Randell Loop CMA  June 01, 2009 3:13 PM     Additional Follow-up for Phone Call Additional follow up Details #2::    called and spoke with pt.  pt aware of SN's recs and rx sent to pharmacy.  Aundra Millet Reynolds LPN  June 01, 2009 3:31 PM   New/Updated Medications: ZITHROMAX Z-PAK 250 MG TABS (AZITHROMYCIN) take as directed * MAGIC MOUTHWASH 1 tsp gargle and swallow four times a day Prescriptions: MAGIC MOUTHWASH 1 tsp gargle and swallow four times a day  #4 oz x 0   Entered by:   Arman Filter LPN   Authorized by:   Michele Mcalpine MD   Signed by:   Arman Filter LPN on 16/12/9602   Method used:   Telephoned to ...       [facility]       [address]       [place], [place]  [address]       Ph: [phone]       Fax: [phone]   RxID:   [id number] ZITHROMAX Z-PAK 250 MG TABS (AZITHROMYCIN) take as directed  #1 x 0   Entered by:   Arman Filter LPN   Authorized by:   Michele Mcalpine MD   Signed by:   Arman Filter LPN on 12/30/2534   Method used:   Telephoned to ...       [facility]       [address]       [place], [place]  [address]       Ph: [phone]       Fax: [phone]   RxID:   [id number]

## 2010-04-04 NOTE — Progress Notes (Signed)
Summary: metoprolol not at pharmacy - telephoned x1  Phone Note Call from Patient Call back at Home Phone [phone]   Caller: patient spouse Call For: Dr. Marijean Heath Summary of Call: Chantel transferred patient's wife to me.  per pt's wife, she just left [facility] but was told that pt's metoprolol was not at the pharmacy.  informed pt's wife that per EMR a refill was sent yesterday morning but assured her i will call the pharmacy and give this to them verbally.  pt's spouse okay with this and will keep upcoming appt w/ TP on Thursday.  called [facility], spoke with Victorino Dike.  gave verbal okay to refill this time, and we will send further refills when patient keeps his appt.  will not docuement this verbal refill since Katie sent one yesterday. Initial call taken by: Boone Master CNA/MA,  October 18, 2009 5:24 PM

## 2010-04-04 NOTE — Progress Notes (Signed)
Summary: rx refills  Phone Note Call from Patient Call back at [phone]   Caller: Patient Call For: nadel Reason for Call: Talk to Nurse Summary of Call: Simvistatin, nexium and metroprolol need to be called in.  Pt has an appt on 01/24/2010. [facility] Initial call taken by: Eugene Gavia,  November 10, 2009 4:27 PM  Follow-up for Phone Call        Spoke with pt.  Verified the above rxs.  Refills were sent to pharm.  Spoke with pt and notified refills were sent and needs to keep followup for refills. Follow-up by: Vernie Murders,  November 10, 2009 4:48 PM    Prescriptions: PANTOPRAZOLE SODIUM 40 MG TBEC (PANTOPRAZOLE SODIUM) Take 1 tablet by mouth two times a day  #60 x 2   Entered by:   Vernie Murders   Authorized by:   Michele Mcalpine MD   Signed by:   Vernie Murders on 11/10/2009   Method used:   Electronically to        [facility] (ret[facility])       [address]       [place], [place]  [address]       Ph: [phone]       Fax: [phone]   RxID:   [id number] ZOCOR 80 MG  TABS (SIMVASTATIN) take one tablet by mouth at bedtime  #30 x 2   Entered by:   Vernie Murders   Authorized by:   Michele Mcalpine MD   Signed by:   Vernie Murders on 11/10/2009   Method used:   Electronically to        [facility] (retail)       [address]       [place], [place]  [address]       Ph: [phone]       Fax: [phone]   RxID:   [id number] METOPROLOL TARTRATE 50 MG TABS (METOPROLOL TARTRATE) take 1 tab by mouth two times a day...  #60 x 2   Entered by:   Vernie Murders   Authorized by:   Michele Mcalpine MD   Signed by:   Vernie Murders on 11/10/2009   Method used:   Electronically to        [facility] (retail)       [address]       [place], [place]  [address]       Ph: [phone]       Fax: [phone]   RxID:   [id number]

## 2010-04-06 NOTE — Progress Notes (Signed)
Summary: congestion  Phone Note Call from Patient Call back at [phone]   Caller: Patient Call For: nadel Summary of Call: Pt c/o congestion x1 wk, wants something called in pls advise.// [facility] Initial call taken by: Darletta Moll,  March 16, 2010 10:19 AM  Follow-up for Phone Call        [facility] x 1. Abigail Miyamoto RN  March 16, 2010 11:46 AM   Pt c/o chest congestion, prod cough (green ).  Denies fever.  Started with itchy eyes and sorethroat but this is some better now.  Pt taking Nyquil.  Please advise. Abigail Miyamoto RN  March 16, 2010 12:16 PM   Additional Follow-up for Phone Call Additional follow up Details #1::        per SN---ok for zpak #1  take as directed and use mucinex 2 by mouth two times a day with plenty of fluids Randell Loop CMA  March 16, 2010 2:12 PM     Additional Follow-up for Phone Call Additional follow up Details #2::    Spoke with pt and notified of recs per SN.  Pt verbalized understanding and zpack was sent to pharm. Follow-up by: Vernie Murders,  March 16, 2010 2:19 PM  New/Updated Medications: ZITHROMAX Z-PAK 250 MG TABS (AZITHROMYCIN) take as directed Prescriptions: ZITHROMAX Z-PAK 250 MG TABS (AZITHROMYCIN) take as directed  #1 x 0   Entered by:   Vernie Murders   Authorized by:   Michele Mcalpine MD   Signed by:   Vernie Murders on 03/16/2010   Method used:   Electronically to        [facility] (retail)       [address]       [place], [place]  [address]       Ph: [phone]       Fax: [phone]   RxID:   [id number]

## 2010-04-24 ENCOUNTER — Telehealth (INDEPENDENT_AMBULATORY_CARE_PROVIDER_SITE_OTHER): Payer: Self-pay | Admitting: *Deleted

## 2010-05-02 NOTE — Progress Notes (Signed)
Summary: cough- z pak and hydromet   Phone Note Call from Patient Call back at [phone]   Caller: Patient Call For: nadel Summary of Call: Pt c/o cough x3 days wants something called in pls advise.//[facility] Initial call taken by: Darletta Moll,  April 24, 2010 10:29 AM  Follow-up for Phone Call        Called, spoke with pt's wife.  States pt having hacking cough, some wheezing, chest tightness, chest congestion x 4 days.  Denies f/c/s.  Has tried mucinex, nyquil, and delsym with no relief.  Requesting cough syrup and abx (states zpak usually works well).   nkda - verified [facility]  Last OV 01/2010 Pending OV 07/2010  Dr. Kriste Basque, pls advise.  Thanks! Follow-up by: Gweneth Dimitri RN,  April 24, 2010 12:26 PM  Additional Follow-up for Phone Call Additional follow up Details #1::        per SN----zpak #1  take as directed and hydromet #6oz   1 tsp by mouth every 4 hours as needed for cough.  thanks Randell Loop CMA  April 24, 2010 2:46 PM     Additional Follow-up for Phone Call Additional follow up Details #2::    pt's spouse has called back again re: same. call her at the home # above. Tivis Ringer, CNA  April 24, 2010 3:13 PM  Called, spoke with pt.  he was informed to take z pak as directed and hydromet 1 tsp every 4 hours as needed cough per SN.  He verbalized understanding and aware rxs sent to [facility] and will call back if sxs worsen or do not improve. Follow-up by: Gweneth Dimitri RN,  April 24, 2010 3:29 PM  New/Updated Medications: ZITHROMAX Z-PAK 250 MG TABS (AZITHROMYCIN) take as directed HYDROMET 5-1.5 MG/5ML SYRP (HYDROCODONE-HOMATROPINE) 1 tsp by mouth every 4 hours as needed for cough Prescriptions: HYDROMET 5-1.5 MG/5ML SYRP (HYDROCODONE-HOMATROPINE) 1 tsp by mouth every 4 hours as needed for cough  #6oz x 0   Entered by:   Gweneth Dimitri RN   Authorized by:   Michele Mcalpine MD   Signed by:   Gweneth Dimitri RN on 04/24/2010   Method  used:   Telephoned to ...       [facility]       [address]       [place], [place]  [address]       Ph: [phone]       Fax: [phone]   RxID:   [id number] ZITHROMAX Z-PAK 250 MG TABS (AZITHROMYCIN) take as directed  #1 x 0   Entered by:   Gweneth Dimitri RN   Authorized by:   Michele Mcalpine MD   Signed by:   Gweneth Dimitri RN on 04/24/2010   Method used:   Electronically to        [facility] (retail)       [address]       [place], [place]  [address]       Ph: [phone]       Fax: [phone]   RxID:   [id number]

## 2010-05-04 ENCOUNTER — Telehealth (INDEPENDENT_AMBULATORY_CARE_PROVIDER_SITE_OTHER): Payer: Self-pay | Admitting: *Deleted

## 2010-05-09 ENCOUNTER — Telehealth: Payer: Self-pay | Admitting: Cardiology

## 2010-05-11 NOTE — Progress Notes (Signed)
Summary: would like prescription for one touch ultra test strips  Phone Note Call from Patient Call back at Home Phone [phone]   Caller: spouse/debra Call For: nadel Summary of Call: Patients spouse Kurt Kidd phoned and wanted to know if would write him a prescription for his one touch ultra two test strips they are out expensive over the counter and they would like a prescription called to [facility]. They are going out of town in the morning and he is out. Patient can be reached at [phone] you can leave a message Initial call taken by: Vedia Coffer,  May 04, 2010 3:53 PM  Follow-up for Phone Call        Spoke with pt's spouse and advised rx was sent to pharm for test strips. Follow-up by: Kurt Kidd,  May 04, 2010 4:16 PM    New/Updated Medications: ONETOUCH ULTRA BLUE  STRP (GLUCOSE BLOOD) test blood sugar 1 time daily Prescriptions: ONETOUCH ULTRA BLUE  STRP (GLUCOSE BLOOD) test blood sugar 1 time daily  #30 x 11   Entered by:   Kurt Kidd   Authorized by:   Michele Mcalpine MD   Signed by:   Kurt Kidd on 05/04/2010   Method used:   Electronically to        [facility] (retail)       [address]       [place], [place]  [address]       Ph: [phone]       Fax: [phone]   RxID:   [id number]

## 2010-05-17 ENCOUNTER — Encounter: Payer: Self-pay | Admitting: Cardiology

## 2010-05-18 ENCOUNTER — Encounter: Payer: Self-pay | Admitting: Cardiology

## 2010-05-18 ENCOUNTER — Ambulatory Visit (INDEPENDENT_AMBULATORY_CARE_PROVIDER_SITE_OTHER): Payer: BC Managed Care – PPO | Admitting: Cardiology

## 2010-05-18 DIAGNOSIS — E782 Mixed hyperlipidemia: Secondary | ICD-10-CM

## 2010-05-18 DIAGNOSIS — I2510 Atherosclerotic heart disease of native coronary artery without angina pectoris: Secondary | ICD-10-CM

## 2010-05-18 DIAGNOSIS — R002 Palpitations: Secondary | ICD-10-CM

## 2010-05-23 NOTE — Progress Notes (Signed)
Summary: pt calling to set up cardiac cath   Phone Note Call from Patient   Caller: Patient's wife [phone] debbie   Reason for Call: Talk to Nurse Summary of Call: pt request to set up a cardiac cath Initial call taken by: Glynda Jaeger,  May 09, 2010 10:38 AM  Follow-up for Phone Call        Left message to call back Meredith Staggers, RN  May 09, 2010 11:03 AM   Additional Follow-up for Phone Call Additional follow up Details #1::        spouse called again to schedule cath Omer Jack  May 09, 2010 4:20 PM   Left message to call back Meredith Staggers, RN  May 09, 2010 5:20 PM   Left message to call back Meredith Staggers, RN  May 10, 2010 9:49 AM   Left message to call back on # above and home # Meredith Staggers, RN  May 10, 2010 5:37 PM     Additional Follow-up for Phone Call Additional follow up Details #2::    spoke w/pt he states he wants to get his stent checked out to make sure it is still open, he denies CP or discomfort but states he has been getting SOB off/on, 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

## 2010-05-23 NOTE — Miscellaneous (Signed)
  Clinical Lists Changes  Observations: Added new observation of PAST MED HX: HYPERTENSION CAD     DES...2004  /  catheterization 2008... 10% narrowing stent site... pain not cardiac HYPERLIPIDEMIA ... SYSTOLIC MURMUR  DIARRHEA ABDOMINAL PAIN RIGHT UPPER QUADRANT BRONCHITIS, ACUTE  DIABETES MELLITUS, BORDERLINE OVERWEIGHT GERD DIVERTICULOSIS OF COLON IISCHEMIC COLITIS DEGENERATIVE JOINT DISEASE  ONYCHOMYCOSIS  Rapid heartbeat and shortness of breath after eating  2008   (05/17/2010 10:22)       Past History:  Past Medical History: HYPERTENSION CAD     DES...2004  /  catheterization 2008... 10% narrowing stent site... pain not cardiac HYPERLIPIDEMIA ... SYSTOLIC MURMUR  DIARRHEA ABDOMINAL PAIN RIGHT UPPER QUADRANT BRONCHITIS, ACUTE  DIABETES MELLITUS, BORDERLINE OVERWEIGHT GERD DIVERTICULOSIS OF COLON IISCHEMIC COLITIS DEGENERATIVE JOINT DISEASE  ONYCHOMYCOSIS  Rapid heartbeat and shortness of breath after eating  2008

## 2010-05-23 NOTE — Assessment & Plan Note (Signed)
Summary: sob/hms  Medications Added METOPROLOL SUCCINATE 50 MG XR24H-TAB (METOPROLOL SUCCINATE) Take one tablet by mouth two times a day SIMVASTATIN 80 MG TABS (SIMVASTATIN) Take one tablet by mouth daily at bedtime NEXIUM 40 MG CPDR (ESOMEPRAZOLE MAGNESIUM) Take 1 tablet by mouth once a day      Allergies Added: NKDA  Visit Type:  Re-establish cardiac care. Primary Provider:  Alroy Dust, MD  CC:  palpitations.  History of Present Illness: Patient is seen to reestablish cardiac care.  I saw him last in 2008.  He has coronary disease with a stent placed in 2004.  In 2008 he had a followup catheter with 10% restenosis in his stent.  Historically the patient has had episodes of rapid heartbeat.  We have not found an arrhythmia in the past.  He had this type of symptom when he had his problem that led to catheterization in 2008.  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  His work has not been affected.  His spells come on usually 45 minutes after eating.  He developed rapid heart rate.  There is no syncope or presyncope.  As part of today's evaluation I have reviewed the old records including my note from 2008.  I have updated the past medical history.  Current Medications (verified): 1)  Bayer Aspirin 325 Mg  Tabs (Aspirin) .... Take 1 Tablet By Mouth Once A Day 2)  Nitrostat 0.4 Mg Subl (Nitroglycerin) .Marland Kitchen.. 1 Under Tongue Every 5 Minutes X3 As Needed Chest Pain 3)  Metoprolol Succinate 50 Mg Xr24h-Tab (Metoprolol Succinate) .... Take One Tablet By Mouth Two Times A Day 4)  Simvastatin 80 Mg Tabs (Simvastatin) .... Take One Tablet By Mouth Daily At Bedtime 5)  Metformin Hcl 500 Mg Tabs (Metformin Hcl) .... Take 1  Tab By Mouth Two Times A Day... 6)  Nexium 40 Mg Cpdr (Esomeprazole Magnesium) .... Take 1 Tablet By Mouth Once A Day 7)  Multivitamins   Tabs (Multiple Vitamin) .... Take 1 Tablet By Mouth Once A Day 8)  Hydromet 5-1.5 Mg/58ml Syrp (Hydrocodone-Homatropine) .Marland Kitchen.. 1 Tsp  By Mouth Every 4 Hours As Needed For Cough 9)  Onetouch Ultra Blue  Strp (Glucose Blood) .... Test Blood Sugar 1 Time Daily  Allergies (verified): No Known Drug Allergies  Past History:  Past Medical History: HYPERTENSION CAD     DES...2004  /  catheterization 2008... 10% narrowing stent site... pain not cardiac HYPERLIPIDEMIA ... SYSTOLIC MURMUR  DIARRHEA ABDOMINAL PAIN RIGHT UPPER QUADRANT BRONCHITIS, ACUTE  DIABETES MELLITUS, BORDERLINE OVERWEIGHT GERD DIVERTICULOSIS OF COLON IISCHEMIC COLITIS DEGENERATIVE JOINT DISEASE  ONYCHOMYCOSIS  Rapid heartbeat and shortness of breath after eating  2008  /  recurrent episodes 2012  Review of Systems       Patient denies fever, chills, headache, rash, change in vision, change in hearing, chest pain, cough, nausea vomiting, urinary symptoms.All other systems are reviewed and are negative.  Vital Signs:  Patient profile:   60 year old male Height:      68 inches Weight:      200.8 pounds BMI:     30.64 Pulse rate:   65 / minute BP sitting:   120 / 66  (left arm) Cuff size:   regular  Vitals Entered By: Caralee Ates CMA (May 18, 2010 8:18 AM)  Physical Exam  General:  patient is stable. Head:  head is atraumatic. Eyes:  no xanthelasma. Neck:  no jugular venous distention. Chest Wall:  no chest wall  tenderness. Lungs:  lungs are clear respiratory effort is nonlabored. Heart:  cardiac exam reveals an S1-S2.  No clicks.  There is a 2/6 systolic murmur. Abdomen:  abdomen soft. Msk:  no musculoskeletal deformities. Extremities:  no peripheral edema. Skin:  no skin rashes. Psych:  patient is oriented to person time and place.  Affect is normal.   Impression & Recommendations:  Problem # 1:  HYPERTENSION (ICD-401.9) Blood pressure is controlled today.  No change in therapy.  Problem # 2:  CORONARY ARTERY DISEASE (ICD-414.00)  Patient has known coronary disease.  It has been difficult over the years to assess his  symptoms.  He has definite disease.  When he had symptoms in 2008 precatheterization did not show any significant problems.  EKG is done today and reviewed by me.  He has lateral T-wave inversions compatible with left ventricular hypertrophy.  This tracing will be compared to a prior tracing before final decisions will be made.  Old EKGs have been obtained.  He has old lateral T-wave changes.  They're slightly more marked now but I doubt that this is a significant finding.  Orders: EKG w/ Interpretation (93000) Event (Event) Echocardiogram (Echo)  Problem # 3:  HYPERLIPIDEMIA (ICD-272.4) Patient lipids are treated.  This is followed by his primary physician.  He has been on this dose of medication for a long period of time.  He is not on other medicines that would cause [place] to adjust the dose.  Problem # 4:  SYSTOLIC MURMUR (ONG-295.2)  The patient does have a systolic murmur.  2-D echo is needed to assess this further.  Orders: Echocardiogram (Echo)  Problem # 5:  DIABETES MELLITUS, BORDERLINE (ICD-790.29) The patient does have diabetes. This  always raises the concerns about his coronary disease.  Problem # 6:  * RAPID HEARTBEAT EPISODES  The patient has had some of these episodes in the past.  They seem more concerning now.  I not convinced that cardiac catheterization will be the right test to start with.  Patient wear an event recorder for 21 days.  2-D echo will be done.  I will then see him for followup.  Orders: Event (Event)  Patient Instructions: 1)  Your physician has requested that you have an echocardiogram.  Echocardiography is a painless test that uses sound waves to create images of your heart. It provides your doctor with information about the size and shape of your heart and how well your heart's chambers and valves are working.  This procedure takes approximately one hour. There are no restrictions for this procedure. 2)  Your physician has recommended that you wear  an event monitor.  Event monitors are medical devices that record the heart's electrical activity. Doctors most often use these monitors to diagnose arrhythmias. Arrhythmias are problems with the speed or rhythm of the heartbeat. The monitor is a small, portable device. You can wear one while you do your normal daily activities. This is usually used to diagnose what is causing palpitations/syncope (passing out). 3)  Follow up in 4 weeks

## 2010-05-26 ENCOUNTER — Other Ambulatory Visit (HOSPITAL_COMMUNITY): Payer: Self-pay | Admitting: Cardiology

## 2010-05-26 DIAGNOSIS — I2510 Atherosclerotic heart disease of native coronary artery without angina pectoris: Secondary | ICD-10-CM

## 2010-05-29 ENCOUNTER — Encounter (INDEPENDENT_AMBULATORY_CARE_PROVIDER_SITE_OTHER): Payer: BC Managed Care – PPO

## 2010-05-29 ENCOUNTER — Ambulatory Visit (HOSPITAL_COMMUNITY): Payer: BC Managed Care – PPO | Attending: Cardiology

## 2010-05-29 DIAGNOSIS — E785 Hyperlipidemia, unspecified: Secondary | ICD-10-CM | POA: Insufficient documentation

## 2010-05-29 DIAGNOSIS — E669 Obesity, unspecified: Secondary | ICD-10-CM | POA: Insufficient documentation

## 2010-05-29 DIAGNOSIS — M1990 Unspecified osteoarthritis, unspecified site: Secondary | ICD-10-CM | POA: Insufficient documentation

## 2010-05-29 DIAGNOSIS — K219 Gastro-esophageal reflux disease without esophagitis: Secondary | ICD-10-CM | POA: Insufficient documentation

## 2010-05-29 DIAGNOSIS — I472 Ventricular tachycardia: Secondary | ICD-10-CM

## 2010-05-29 DIAGNOSIS — E119 Type 2 diabetes mellitus without complications: Secondary | ICD-10-CM | POA: Insufficient documentation

## 2010-05-29 DIAGNOSIS — R011 Cardiac murmur, unspecified: Secondary | ICD-10-CM | POA: Insufficient documentation

## 2010-05-29 DIAGNOSIS — I10 Essential (primary) hypertension: Secondary | ICD-10-CM | POA: Insufficient documentation

## 2010-05-29 DIAGNOSIS — I2510 Atherosclerotic heart disease of native coronary artery without angina pectoris: Secondary | ICD-10-CM | POA: Insufficient documentation

## 2010-06-05 ENCOUNTER — Telehealth: Payer: Self-pay | Admitting: Cardiology

## 2010-06-05 NOTE — Telephone Encounter (Signed)
pts wife given results

## 2010-06-21 ENCOUNTER — Encounter: Payer: Self-pay | Admitting: Cardiology

## 2010-06-21 DIAGNOSIS — B351 Tinea unguium: Secondary | ICD-10-CM | POA: Insufficient documentation

## 2010-06-21 DIAGNOSIS — K219 Gastro-esophageal reflux disease without esophagitis: Secondary | ICD-10-CM | POA: Insufficient documentation

## 2010-06-21 DIAGNOSIS — K5790 Diverticulosis of intestine, part unspecified, without perforation or abscess without bleeding: Secondary | ICD-10-CM | POA: Insufficient documentation

## 2010-06-21 DIAGNOSIS — I340 Nonrheumatic mitral (valve) insufficiency: Secondary | ICD-10-CM | POA: Insufficient documentation

## 2010-06-21 DIAGNOSIS — I10 Essential (primary) hypertension: Secondary | ICD-10-CM | POA: Insufficient documentation

## 2010-06-21 DIAGNOSIS — I2510 Atherosclerotic heart disease of native coronary artery without angina pectoris: Secondary | ICD-10-CM | POA: Insufficient documentation

## 2010-06-21 DIAGNOSIS — E785 Hyperlipidemia, unspecified: Secondary | ICD-10-CM | POA: Insufficient documentation

## 2010-06-21 DIAGNOSIS — R002 Palpitations: Secondary | ICD-10-CM | POA: Insufficient documentation

## 2010-06-21 DIAGNOSIS — K559 Vascular disorder of intestine, unspecified: Secondary | ICD-10-CM | POA: Insufficient documentation

## 2010-06-22 ENCOUNTER — Encounter: Payer: Self-pay | Admitting: Cardiology

## 2010-06-22 ENCOUNTER — Ambulatory Visit (INDEPENDENT_AMBULATORY_CARE_PROVIDER_SITE_OTHER): Payer: BC Managed Care – PPO | Admitting: Cardiology

## 2010-06-22 DIAGNOSIS — I059 Rheumatic mitral valve disease, unspecified: Secondary | ICD-10-CM

## 2010-06-22 DIAGNOSIS — R0989 Other specified symptoms and signs involving the circulatory and respiratory systems: Secondary | ICD-10-CM

## 2010-06-22 DIAGNOSIS — I340 Nonrheumatic mitral (valve) insufficiency: Secondary | ICD-10-CM

## 2010-06-22 DIAGNOSIS — R9430 Abnormal result of cardiovascular function study, unspecified: Secondary | ICD-10-CM

## 2010-06-22 DIAGNOSIS — R002 Palpitations: Secondary | ICD-10-CM

## 2010-06-22 NOTE — Progress Notes (Signed)
HPI Patient is seen for cardiology follow up.  When I saw him last week to arrange for 2-D echo to assess his murmur.  This study showed excellent LV function.  There was no significant valvular heart disease.  There may have been some aortic valve sclerosis.  He does not need any further workup of the murmur.  An event recorder was also arranged.  He notes that his symptoms occur after eating.  He has adjusted his eating pattern and is feeling better he No Known Allergies  Current Outpatient Prescriptions  Medication Sig Dispense Refill  . aspirin 325 MG EC tablet Take 325 mg by mouth daily.        Marland Kitchen atorvastatin (LIPITOR) 40 MG tablet daily.      . metFORMIN (GLUCOPHAGE) 500 MG tablet Twice daily.      . metoprolol (TOPROL-XL) 50 MG 24 hr tablet Take 50 mg by mouth 2 (two) times daily.        . Multiple Vitamin (MULTIVITAMIN) tablet Take 1 tablet by mouth daily.        . nitroGLYCERIN (NITROSTAT) 0.4 MG SL tablet Place 0.4 mg under the tongue every 5 (five) minutes as needed.        . ONE TOUCH ULTRA TEST test strip       . pantoprazole (PROTONIX) 40 MG tablet 40 mg 2 (two) times daily.       Marland Kitchen esomeprazole (NEXIUM) 40 MG capsule Take 40 mg by mouth daily before breakfast.        . HYDROcodone-homatropine (HYCODAN) 5-1.5 MG/5ML syrup as directed.      Marland Kitchen HYDROcodone-homatropine (HYCODAN) 5-1.5 MG/5ML syrup Take by mouth every 6 (six) hours as needed.        . metFORMIN (GLUMETZA) 500 MG (MOD) 24 hr tablet Take 500 mg by mouth 2 (two) times daily with a meal.       . metoprolol (LOPRESSOR) 50 MG tablet         History   Social History  . Marital Status: Married    Spouse Name: N/A    Number of Children: N/A  . Years of Education: N/A   Occupational History  . Not on file.   Social History Main Topics  . Smoking status: Former Games developer  . Smokeless tobacco: Not on file  . Alcohol Use: Yes  . Drug Use:   . Sexually Active:    Other Topics Concern  . Not on file   Social History  Narrative  . No narrative on file    Family History  Problem Relation Age of Onset  . Heart failure Mother   . Diabetes Father     father also had ASHD/ CABG  . Colon cancer Brother     Brother also had AAA repair    Past Medical History  Diagnosis Date  . Hypertension   . CAD (coronary artery disease)     DES.. 2004 /  catheterization 2008, 10% narrowing stent site, pain not cardiac  . Dyslipidemia   . Murmur   . Diabetes mellitus   . Overweight   . GERD (gastroesophageal reflux disease)   . Diverticulosis   . Colitis, ischemic   . Degenerative joint disease   . Onychomycosis   . Palpitations     Rapid heartbeat and shortness of breath after eating 2008 recurrent episode 2012  . Ejection fraction     65-70%, echo, March, 2012  . Mitral regurgitation     Echo, mild, 2012  Past Surgical History  Procedure Date  . Angioplasty / stenting femoral 09/09/2002    ROS  Patient denies fever, chills, headache, sweats, rash, change in vision, change in hearing, chest pain, cough, nausea vomiting, urinary symptoms.  All other systems are reviewed and are negative.  PHYSICAL EXAM Patient is quite stable.  He is oriented to person time and place.  Affect is normal.  There is no xanthelasma.  Lungs are clear.  Respiratory effort is unlabored.  Cardiac exam reveals a 2/6 systolic murmur.  The abdomen is soft there is no peripheral edema. Filed Vitals:   06/22/10 0852  BP: 137/82  Pulse: 78    EKG No EKG is done today.  ASSESSMENT & PLAN

## 2010-06-22 NOTE — Patient Instructions (Signed)
Your physician recommends that you schedule a follow-up appointment in: 6 months with Dr. Katz  

## 2010-06-22 NOTE — Assessment & Plan Note (Addendum)
Patient is feeling much better by adjusting his eating habits.  He always feels his rapid heartbeat after he is eating. The patient wore an event recorder.  I have reviewed all of the strips personally.He has sinus rhythm.  At times he has sinus tachycardia.  There is no evidence of any type of superventricular tachycardia or ventricular tachycardia.  I've explained this to him very carefully.  I reassured him at length.  I believe they're certain foods that he needs that may lead to increased heart rate.  Caffeine may be one of them.  This may also occur when he drinks alcohol.  He will watch and observe but no further workup is needed.

## 2010-06-22 NOTE — Assessment & Plan Note (Signed)
Echo reveals excellent LV function.  No change in therapy.

## 2010-06-22 NOTE — Assessment & Plan Note (Signed)
Patient does have mild mitral regurgitation.  This is stable.  His murmur maybe aortic valve sclerosis.  No further workup is needed.

## 2010-07-12 ENCOUNTER — Ambulatory Visit: Payer: BC Managed Care – PPO | Admitting: Cardiology

## 2010-07-18 NOTE — Assessment & Plan Note (Signed)
[facility] HEALTHCARE                            CARDIOLOGY OFFICE NOTE   NAME:DAVISArvid, [name]                      MRN:          [id number]  DATE:10/14/2006                            DOB:          09-16-1950    [name] has known coronary disease.  In 2004, he received a CYPHER  stent.  At that time he had symptoms in which he felt some intermittent  exertional shortness of breath.  He would have episodes where he would  feel short of breath with some chest heaviness.  Ultimately he received  a CYPHER stent to his LAD.  Since that time he has done well but he has  now had return of his symptoms.  He now has episodes after eating where  he begins to feel short of breath with some chest tightness.  It is  quite concerning.  He has not had any syncope or presyncope.  He has had  some mild palpitations.   PAST MEDICAL HISTORY:  Allergies:  No known drug allergies.   Medications:  1. Zocor 80.  2. Metoprolol 50 b.i.d.  3. Aspirin 325.  4. Fish Oil.  5. Ranitidine.  6. Multivitamin.  7. Unisol.   Other Medical Problems:  See the list below.   REVIEW OF SYSTEMS:  See the HPI.  Otherwise, his review of systems is  negative.   PHYSICAL EXAM:  Weight is 208 pounds.  Blood pressure is 128/84 with a  pulse of 72.  The patient is oriented to person, time and place.  Affect  is normal.  He is significantly overweight.  There is no xanthelasma.  He has normal extraocular motion.  There are no carotid bruits.  There  is no jugular venous distention.  CARDIAC EXAM:  Reveals an S1 with an S2.  There is a 2/6  crescendo/decrescendo systolic murmur.  ABDOMEN:  Protuberant.  He has no significant peripheral edema.  He has  1+ distal pulses.   EKG reveals that he has old lateral T-wave inversions which is not  significantly changed.   PROBLEMS:  1. Dyslipidemia, on medications.  This has been followed most recently      by Dr. Kriste Kidd.  Unfortunately, he has  significant elevation of his      triglyceride.  He will need followup of his lipids.  2. Soft systolic murmur that has been heard in the past.  He will need      a followup 2D echocardiogram at some point.  3. Coronary disease post CYPHER stent in July of 2004.  He has return      of his same symptoms.  He is quite concerned about it.  I have      carefully considered whether exercise testing will help [place].  I do      not believe that it will in this situation.  He needs followup      catheterization and this will be arranged.  4. History of gastroesophageal reflux disease.  5. History of obesity.  He clearly needs to lose weight.   I believe that he needs  cardiac catheterization for his symptoms.  This  will be arranged.     Kurt Abed, MD, [facility]  Electronically Signed    JDK/MedQ  DD: 10/14/2006  DT: 10/15/2006  Job #: [id number]

## 2010-07-18 NOTE — Assessment & Plan Note (Signed)
[facility] HEALTHCARE                            CARDIOLOGY OFFICE NOTE   NAME:DAVISMaurion, Kidd                      MRN:          [id number]  DATE:11/07/2006                            DOB:          [birth year]    [name] is seen for cardiology follow up.  I had seen him on October 14, 2006; and I was worried about the spells that he was having.  We  proceeded with cardiac catheterization done by Dr. Juanda Chance on October 16, 2006.  Fortunately, the study shows that the patient has only 10%  narrowing at his stent sight; and other minor areas of narrowing.  Left  ventricular filling pressures were normal, and pulmonary artery  pressures were normal.  We do not think that ischemia is the basis of  his symptoms.  There is question as to whether or not it would be  related to a variation of blood pressure, or whether he has some type of  GI reflux mediated bronchospasms.   Today, in the office, he again gives a history of having 3 episodes  since he was seen last at the catheterization.  Usually these occur  after eating.  He does have a history of GERD, but he says that he is  having no significant symptoms.  He also can have symptoms, at times,  when he is walking, for example, across the parking lot.  He describes a  sensation of feeling a rapid heartbeat and feeling significantly short  of breath.  It is possible that this is a supraventricular arrhythmia.  We will keep in mind the possibility of reflux related bronchospasms,  but a tachycardia appears to be a part of his symptom complex.   PAST MEDICAL HISTORY/ALLERGIES:  No known drug allergies.   MEDICATIONS:  1. Zocor 80.  2. Metoprolol 50 b.i.d.  3. Aspirin 325.  4. Fish oil.  5. Ranitidine.  6. Unisom.   OTHER MEDICAL PROBLEMS:  See list below.   REVIEW OF SYSTEMS:  He, otherwise, is feeling well.  He is not having  any major GI or GU symptoms.  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  He does admit that he is overweight.  He raises  the question as to whether or not this could be anxiety; this could  possibly play a role, but I am not convinced.  Otherwise, his review of  systems is negative.   PHYSICAL EXAM:  Weight is 212 pounds.  He needs to begin to lose weight.  Blood pressure 156/74, pulse is 66.  The patient is oriented to person, time, and place.  Affect was normal.  HEENT:  Reveals no xanthelasma.  He has normal extraocular motion.  There are no carotid bruits.  There are no jugular venous distention.  LUNGS:  Clear.  Respiratory effort is not labored.  CARDIAC EXAM:  Reveals an S1 with an S2.  There are no clicks or  significant murmurs.  ABDOMEN:  Protuberant.  He has normal bowel sounds.  He has significant peripheral edema.   PROBLEMS  INCLUDE:  1. Dyslipidemia on medication.  2. History of a systolic murmur  He has a soft murmur at this time.  I      will consider a follow up 2-D echo.  3. History of coronary disease with a Cypher stent in July 2004 -- see      the cath data as mentioned above.  He does not appear to have      obstructive disease at this time.  4. History of gastroesophageal reflux disease.  5. History of obesity; and the clearly needs to lose weight.  6. Ongoing episodes of feeling rapid heartbeat and shortness of breath      usually after eating.  This does not appear to be associated with a      cough or reflux symptoms.  He thinks that he does better if he      takes his Metoprolol before he eats.  It is possible that the beta      blocker is playing a positive effect.  We will change his      Metoprolol to Metoprolol extended release; and we will have him      wear an event recorder; and I will then see him for followup.     Luis Abed, MD, [facility]  Electronically Signed    JDK/MedQ  DD: 11/07/2006  DT: 11/07/2006  Job #: [id number]

## 2010-07-18 NOTE — Cardiovascular Report (Signed)
NAME:  Kurt Kidd, Kurt Kidd               ACCOUNT NO.:  [id number]   MEDICAL RECORD NO.:  [id number]          PATIENT TYPE:  OIB   LOCATION:  1961                         FACILITY:  [facility]   PHYSICIAN:  Bruce R. Juanda Chance, MD, FACCDATE OF BIRTH:  1950/08/22   DATE OF PROCEDURE:  10/16/2006  DATE OF DISCHARGE:                            CARDIAC CATHETERIZATION   CLINICAL HISTORY:  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  In 2004 he had a Cypher stent placed to the left  anterior descending artery.  Recently he developed symptoms of shortness  of breath after eating but not with exertion.  His symptoms were  somewhat similar to what he had prior to stent implantation and he was  seen by Dr. Myrtis Ser and scheduled for evaluation with angiography.  He also  has a history of hypertension.   PROCEDURE:  The procedure was performed via the right femoral artery  using an arterial sheath and 4-French preformed coronary catheters.  A  front wall arterial puncture was performed and Omnipaque contrast was  used.  After we did not identify any significant obstruction in the  coronary arteries we performed a right heart catheterization via the  right femoral vein using a venous sheath and Swan-Ganz thermodilution  catheter.  The patient tolerated the procedure well and left the  laboratory in satisfactory condition.   RESULTS:  The left main coronary is free of significant disease.   The left anterior descending artery gave rise to three diagonal branch  and two septal perforators.  There was less than 10% narrowing at the  stent site in the proximal LAD.   The circumflex artery gave rise to a large marginal branch and a large  posterolateral branch.  There was 40% narrowing in the circumflex artery  just after the marginal branch.   The right coronary artery was a moderate-sized vessel that gave rise to  a conus branch, an atrial branch, a right ventricle branch, a  posterior  descending branch, and a posterolateral branch.  There was 40% narrowing  in the proximal right coronary artery just before the right ventricular  branch.   The left ventriculogram performed in the RAO projection showed vigorous  wall motion with no areas of hypokinesis.  There appeared to be left  ventricular hypertrophy.  The ejection fraction was estimated at 65%.   HEMODYNAMIC DATA:  The right atrial pressure was 6 mean.  The pulmonary  artery pressure was 24/12 with a mean of 20.  The pulmonary wedge  pressure was 12 mean.  Left ventricular pressure was 152/19.  The aortic  pressure was 152/77 with a mean of 113.   CONCLUSION:  1. Nonobstructive coronary artery disease with less than 10% narrowing      at the stent site in the proximal left anterior descending artery,      40% narrowing in the proximal circumflex artery, 40% narrowing in      the proximal right coronary artery, and normal left ventricular      function.  2. Normal pulmonary artery and  left ventricular filling pressures.   RECOMMENDATIONS:  [name] has nonobstructive coronary artery disease  and no source of ischemia.  I do not think his symptoms are cardiac  related.  He questions whether they might be related to elevation in his  blood pressure.  The relation to eating would raise the possibly this  could be gastrointestinal reflux mediated bronchospasm.  Will plan  followup with Dr. Myrtis Ser to decide about further evaluation.      Bruce Elvera Lennox Juanda Chance, MD, [facility]  Electronically Signed     BRB/MEDQ  D:  10/16/2006  T:  10/17/2006  Job:  [id number]   cc:   Luis Abed, MD, [facility]  Lonzo Cloud. Kriste Basque, MD  Cardiopulmonary Lab

## 2010-07-21 NOTE — Cardiovascular Report (Signed)
   NAME:  Kurt Kidd, Kurt Kidd                         ACCOUNT NO.:  [id number]   MEDICAL RECORD NO.:  [id number]                   PATIENT TYPE:  OIB   LOCATION:  2907                                 FACILITY:  [facility]   PHYSICIAN:  Charlton Haws, M.D.                  DATE OF BIRTH:  1951-02-03   DATE OF PROCEDURE:  DATE OF DISCHARGE:                              CARDIAC CATHETERIZATION   PROCEDURE PERFORMED:  Coronary arteriography.   CARDIOLOGIST:  Charlton Haws, M.D.   INDICATIONS:  Exertional dyspnea, question anginal equivalent.   DESCRIPTION OF PROCEDURE:  A standard catheterization was done from right  femoral artery and vein.  Since the patient's primary symptoms were dyspnea  right heart catheterization was performed.   RESULTS:  The left main coronary artery had a 30% discrete stenosis.   The left anterior descending artery had an 80% tubular lesion in the  midvessel.  There was a small first diagonal branch just at the takeoff of  the lesion.  There were 30% multiple discrete lesions distally.   The circumflex coronary artery had 30% multiple discrete lesions in the  midportion.   The right coronary artery had 30% multiple discrete lesions in the proximal  portion.  There was mild catheter tip spasm.   RAO ventriculography revealed an EF of 70%.  There were no wall motion  abnormalities.  There was no gradient across the aortic valve and no MR.  Aortic pressure was 148/95.  LV pressure was 145/13.   Right heart catheterization showed a mean right atrial pressure of 15, RV  pressure was 35/10.  PA pressure 30/16.  Mean capillary wedge pressure was  17.   IMPRESSION:  The patient has a normal chest x-ray with no significant  pulmonary hypertension or elevation in the right-sided cardiac pressures.  I  think his exertional dyspnea is an anginal equivalent due to the mid left  anterior descending lesion.   The films will be reviewed with Dr. Riley Kill, but I suspect we will  proceed  with angioplasty and stenting of the mid LAD.                                                Charlton Haws, M.D.    PN/MEDQ  D:  09/09/2002  T:  09/10/2002  Job:  [id number]  Willa Rough, M.D.   Lonzo Cloud. Kriste Basque, M.D. [facility]   cc:   Willa Rough, M.D.   Lonzo Cloud. Kriste Basque, M.D. [facility]

## 2010-07-21 NOTE — Discharge Summary (Signed)
NAMEANSAR, SKODA               ACCOUNT NO.:  [id number]   MEDICAL RECORD NO.:  [id number]          PATIENT TYPE:  INP   LOCATION:  3305                         FACILITY:  [facility]   PHYSICIAN:  Lonzo Cloud. Kriste Basque, M.D. [facility] OF BIRTH:  1950/04/22   DATE OF ADMISSION:  11/05/2004  DATE OF DISCHARGE:  11/07/2004                                 DISCHARGE SUMMARY   DISCHARGE DIAGNOSES:  1.  Acute gastrointestinal bleed.  2.  Ischemic colitis.   CONSULTS:  [facility], Dr. Arlyce Dice   LABORATORY DATA:  November 07, 2004 hemoglobin 9.9, hematocrit 27.8.  November 06, 2004 H&H hemoglobin 9.9, hematocrit 27.9.  November 05, 2004  hemoglobin 8.8, hematocrit 24.6.  November 05, 2004 PT 13.3, INR 1.  November 05, 2004 chest x-ray showing no acute disease.  November 06, 2004  colonoscopy with biopsy for heme-positive stools.  Findings:  Significant  for a mucosal abnormality from the descending colon to the sigmoid colon.  It was erythemic.  Erosions were present.  Biopsy of mucosal abnormality was  taken.  Findings consistent with segmental areas of colitis, more  specifically ischemic colitis.   BRIEF HISTORY:  This is a 60 year old white male patient of Dr. Alroy Dust  who reported to the emergency room on November 05, 2004 with chief complaint  of having passed out.  He had noted intermittent bright red blood per rectum  for four days' duration.  On day of admission he had gotten up from sleep to  go to the bathroom, had a very bloody stool and upon getting up from the  commode had a syncopal episode.  The wife reports that he had had blood in  the commode and on the floor, dark red in color.  Apparently he had been  feeling gradually weaker since the onset of these symptoms approximately  four days' in duration.  Upon presentation in the emergency room Dr.  Jayme Cloud found him to be very pale.  He denied any epigastric pain,  hematemesis, or abdominal pain.  Although he did complain of some  abdominal  left lower quadrant discomfort.  He denied any fever, chills, chest pain.  He had no other significant history.   PAST MEDICAL HISTORY:  1.  Significant for coronary artery disease after a stent of the LAD in July      2004.  2.  Dyslipidemia.  3.  GERD.  4.  Hypertension.   HOSPITAL COURSE:  #1 - ACUTE GASTROINTESTINAL BLEED WITH RESULTANT  HEMORRHAGIC SHOCK:  Patient was emergently transfused 2 units of packed red  blood cells in the emergency room.  Gastroenterology was consulted.  Additionally he was begun on twice a day Protonix intravenously.  After  bowel prep a colonoscopy was performed on November 06, 2004 demonstrating  findings consistent with ischemic colitis (noted above).  Recommendations  were made to hold aspirin for 10 more days upon time of discharge and  recheck serial hemoglobin and hematocrit.  Patient's hemoglobin and  hematocrit appropriately increased following blood transfusion.  He is now  with a stable hemoglobin x24 hours.  He denies  abdominal pain or any other  symptoms of bleeding.  Denies any lightheadedness or any other pertinent  positives.  Currently ready for discharge.   #2 - ISCHEMIC COLITIS:  Unclear etiology.  Diagnosed by colonoscopy on  November 06, 2004.  Patient has been informed to stay away from all NSAIDs  as well as aspirin.  He will resume aspirin 81 mg a day approximately 10  days after discharge on September 15 unless otherwise stated.  He does have  routine post hospital follow-up with Dr. Kriste Basque on September 12 at which time  he will have routine blood work drawn to check on his hemoglobin.   DISCHARGE INSTRUCTIONS:   MEDICATIONS:  1.  Nexium 40 mg p.o. daily.  2.  Metoprolol 50 mg p.o. b.i.d.  3.  Zocor 80 mg p.o. daily.  4.  No aspirin for 10 days, then restart aspirin 81 mg daily on September      15.  5.  He can take Tylenol for pain.  6.  He was instructed to avoid ibuprofen or Aleve.   PAIN MANAGEMENT:  Not  applicable.   ACTIVITY:  As tolerated.   DIET:  No restrictions.   FOLLOW-UP:  Appointment with Dr. Alroy Dust on Tuesday, September 12 at  11:30 a.m. at which time he will report to the office at 11:00 prior to  hospital visit for blood draw containing CBC.   PHYSICAL EXAMINATION:  VITAL SIGNS:  Upon time of discharge temperature 97,  pulse 88, respirations 18-20, blood pressure 153/65.  HEENT:  He is without JVD or lymphadenopathy.  LUNGS:  Breath sounds are clear.  HEART:  Tones normal.  ABDOMEN:  Soft, nontender.  Tolerating his diet.   DISPOSITION:  Ready for discharge.   DISCHARGE INSTRUCTIONS:  Reviewed in full with patient and questions were  answered upon time of discharge.  Patient will be discharged under the care  of his wife.      Anders Simmonds, N.P. [facility]      Scott M. Kriste Basque, M.D. [facility]  Electronically Signed    PB/MEDQ  D:  11/07/2004  T:  11/07/2004  Job:  [id number]   cc:   Barbette Hair. Arlyce Dice, M.D. [facility]  [address]  [place]  [place] [address]

## 2010-07-21 NOTE — Discharge Summary (Signed)
NAME:  Kurt Kidd, Kurt Kidd                         ACCOUNT NO.:  [id number]   MEDICAL RECORD NO.:  [id number]                   PATIENT TYPE:  OIB   LOCATION:  2907                                 FACILITY:  [facility]   PHYSICIAN:  Charlton Haws, M.D.                  DATE OF BIRTH:  1950-06-18   DATE OF ADMISSION:  09/09/2002  DATE OF DISCHARGE:                                 DISCHARGE SUMMARY   DISCHARGE DIAGNOSES:  1. Dyspnea with exertion - suggestive of an anginal equivalent.  2. Coronary artery disease.     a. Status post Cypher stenting to the mid left anterior descending        artery.     b. Residual coronary artery disease:  Left main 20%, 30% distal left        anterior descending artery, circumflex 30% mid, right coronary artery        30% proximal with some catheter tip spasm.  Left ventricle normal with        an ejection fraction of 70%.  3. Dyslipidemia, treated.  4. Remote history of tobacco abuse.  5. Family history of coronary artery disease.   PROCEDURES PERFORMED THIS ADMISSION:  1. Cardiac catheterization by Dr. Charlton Haws on September 09, 2002.  2. Percutaneous coronary intervention by Dr. Shawnie Pons on September 09, 2002.   HOSPITAL COURSE:  Please see the office note from Dr. Myrtis Ser on September 09, 2002  for complete details.  Briefly, this 60 year old male was referred by Dr.  Kriste Basque for symptoms of dyspnea with exertion over the last week that was  getting worse.  There were no changes on EKG.  The patient's symptoms were  very suggestive of an anginal equivalent and he was referred for elective  cardiac catheterization.  He was able to be catheterized on September 09, 2002.  The procedure was as noted above.  He subsequently underwent percutaneous  coronary intervention of the LAD by Dr. Shawnie Pons.  He tolerated the  procedures well and had no immediate complications.  Dr. Eden Emms saw the  patient on the morning of September 10, 2002.  He thought he was stable enough for  discharge to home.  He recommended Plavix for a total of six months.  The  patient's finger stick blood sugar on the morning September 10, 2002 was 174.  Dr.  Eden Emms recommended follow up hemoglobin A1C with his primary care doctor to  rule out diabetes mellitus.  The patient was discharged home in stable  condition.   LABORATORY DATA:  White count 7300, hemoglobin 13.3, hematocrit 37.8,  platelet count 382,000.  INR 1.0.  Sodium 137, potassium 3.6, chloride 102,  CO2 28, glucose 143, BUN 6, creatinine 0.8, calcium 8.5.  Post procedure CK-  MB 2.1.  TSH 1.417.   DISCHARGE MEDICATIONS:  1. Plavix 75 mg daily for six months.  2. Coated aspirin 325  mg q.d.  3. Lipitor.  4. Zetia.  5. Nitroglycerin p.r.n. chest pain.   PAIN MANAGEMENT:  Tylenol as-needed.  Nitroglycerin as-needed for chest  pain.  The patient is to call our office or 911 for recurrent chest pain or  exertional dyspnea.   ACTIVITY:  No driving, heavy lifting, exertion, work or sex for three days.   DIET:  Low salt, low fat.   WOUND CARE:  The patient is to call our office for any groin swelling,  bleeding or bruising.   FOLLOW UP:  The patient will see Dr. Myrtis Ser on September 22, 2002 at 11:45 a.m.  He  is to see Dr. Kriste Basque within the next two weeks.  He should call for an  appointment.  As noted above, he needs to have a hemoglobin A1C drawn by Dr.  Kriste Basque to rule out diabetes mellitus.     Tereso Newcomer, P.A.                        Charlton Haws, M.D.    SW/MEDQ  D:  09/10/2002  T:  09/10/2002  Job:  [id number]   cc:   Lonzo Cloud. Kriste Basque, M.D. [facility]   Willa Rough, M.D.    cc:   Lonzo Cloud. Kriste Basque, M.D. [facility]   Willa Rough, M.D.

## 2010-07-21 NOTE — Cardiovascular Report (Signed)
NAME:  Kurt Kidd, Kurt Kidd                         ACCOUNT NO.:  [id number]   MEDICAL RECORD NO.:  [id number]                   PATIENT TYPE:  OIB   LOCATION:  2861                                 FACILITY:  [facility]   PHYSICIAN:  Arturo Morton. Riley Kill, M.D.             DATE OF BIRTH:  1951/01/23   DATE OF PROCEDURE:  DATE OF DISCHARGE:                              CARDIAC CATHETERIZATION   PROCEDURE PERFORMED:  Percutaneous stenting of the left anterior descending  artery.   CARDIOLOGIST:  Arturo Morton. Riley Kill, M.D.   INDICATIONS:  The patient is a 60 year old who has presented with  progressive shortness of breath.  He underwent diagnostic cardiac  catheterization by Dr. Eden Emms, which demonstrated 80% stenosis in the LAD  beyond a long area of segmental plaquing.  Dr. Eden Emms recommended  percutaneous coronary intervention.  I was called to the lab to assess this  and perform the procedure.   I explained the risks of the procedure to the patient and his family.  The  patient was agreeable to proceed.   DESCRIPTION OF THE PROCEDURE:  The patient was brought to the cath lab and  the previously placed sheaths were prepped and draped.  He had a lot of pain  at the groin site; however, there did not appear to be a hematoma, but there  did appear to be some arterial spasm.  The sheaths were replaced by Virl Diamond in the catheterization laboratory.  Some intra-arterial  nitroglycerin was given and seemed to relieve a substantial amount of the  pain.  Heparin and Integrelin were given according to protocol and ACT  checked.  A JL3.5 Wise guide was used to intubate the left main.  After  adequate anticoagulation the lesion was crossed with a 0.014 Hi-Torque  floppy wire.  We predilated using a 2.5 x 20 CrossSail balloon.  The artery  improved somewhat.  The entire area was then crossed with a 2.75 x 28 Cordis  CYPHER stent.  This was taken up to about 14-15 atmospheres.  This whole  stent was  then post dilated using a 3.25 x 15 and then a 3.25 x 8 PowerSail  balloon.  We carefully placed the balloon over multiple locations and it was  somewhat difficult to post dilate proximally, but we were able to  subsequently do this.  The final angiographic result was excellent.   All catheters were subsequently removed and the femoral sheath sewn into  place.  Because of hypertension the patient did received intravenous  Lopressor, intravenous labetalol and ultimately was started on intravenous  nitroglycerin drip.  The femoral line was used for venous access previously  placed by Dr. Eden Emms.   ANGIOGRAPHIC DATA:  The left anterior descending artery courses to the apex.  It is a large caliber vessel.  There is a fair amount of calcification  proximally.  There is a segmental area of plaquing with folding  of the  vessel of about 40-50% proximally, then a 70-80% lesion just after the  diagonal.  Distal to this the vessel opens up and there is a 3-3.5 mm  artery.  Following balloon dilatation and stenting this was reduced to 0%  residual luminal narrowing on the final angiographic result, which was  excellent.    CONCLUSION:  Percutaneous stenting of the left anterior descending artery  with TIMI III postprocedure.   DISPOSITION:  The patient will be treated with aspirin and Plavix.                                                 Arturo Morton. Riley Kill, M.D.    TDS/MEDQ  D:  09/09/2002  T:  09/10/2002  Job:  [id number]  Kurt Kidd, M.D. [facility]   Kurt Kidd, M.D.   Kurt Kidd, M.D.   cc:   Kurt Kidd, M.D. [facility]   Kurt Kidd, M.D.   Kurt Kidd, M.D.

## 2010-07-25 ENCOUNTER — Ambulatory Visit: Payer: Self-pay | Admitting: Pulmonary Disease

## 2010-12-04 LAB — GLUCOSE, CAPILLARY
Glucose-Capillary: 113 — ABNORMAL HIGH
Glucose-Capillary: 90

## 2010-12-18 LAB — POCT I-STAT 3, VENOUS BLOOD GAS (G3P V)
Bicarbonate: 26.8 — ABNORMAL HIGH
O2 Saturation: 71
Operator id: 194801
TCO2: 28
pCO2, Ven: 49.4
pH, Ven: 7.342 — ABNORMAL HIGH
pO2, Ven: 40

## 2010-12-18 LAB — POCT I-STAT 3, ART BLOOD GAS (G3+)
Acid-Base Excess: 1
Operator id: 194801
pCO2 arterial: 44
pH, Arterial: 7.389
pO2, Arterial: 78 — ABNORMAL LOW

## 2011-02-01 ENCOUNTER — Other Ambulatory Visit: Payer: Self-pay | Admitting: Pulmonary Disease

## 2011-05-15 ENCOUNTER — Telehealth: Payer: Self-pay | Admitting: Pulmonary Disease

## 2011-05-15 MED ORDER — AZITHROMYCIN 250 MG PO TABS: ORAL_TABLET | ORAL | Status: AC

## 2011-05-15 NOTE — Telephone Encounter (Signed)
Called and spoke with pt. Pt aware rx sent to pharmacy.   

## 2011-05-15 NOTE — Telephone Encounter (Signed)
Ok per SN to give zpak, take as directed, #1 pack 0 refills. Carron Curie, CMA

## 2011-05-15 NOTE — Telephone Encounter (Signed)
Spoke with pt. He c/o cough x 4 days- prod with minimal sputum ? Color. He states also having sinus congestion, facial pressure and chills. Pt would like zpack for his symptoms. He was last seen by SN in 2011- and I have scheduled him for followup here in April. Please advise, thanks! No Known Allergies

## 2011-06-26 ENCOUNTER — Ambulatory Visit (INDEPENDENT_AMBULATORY_CARE_PROVIDER_SITE_OTHER)
Admission: RE | Admit: 2011-06-26 | Discharge: 2011-06-26 | Disposition: A | Discharge status: Home / Self Care | Payer: BC Managed Care – PPO | Source: Ambulatory Visit | Attending: Pulmonary Disease | Admitting: Pulmonary Disease

## 2011-06-26 ENCOUNTER — Ambulatory Visit (INDEPENDENT_AMBULATORY_CARE_PROVIDER_SITE_OTHER): Payer: BC Managed Care – PPO | Admitting: Pulmonary Disease

## 2011-06-26 ENCOUNTER — Other Ambulatory Visit (INDEPENDENT_AMBULATORY_CARE_PROVIDER_SITE_OTHER): Payer: BC Managed Care – PPO

## 2011-06-26 ENCOUNTER — Encounter: Payer: Self-pay | Admitting: *Deleted

## 2011-06-26 VITALS — BP 148/80 | HR 68 | Temp 97.1°F | Ht 66.0 in | Wt 198.6 lb

## 2011-06-26 DIAGNOSIS — E785 Hyperlipidemia, unspecified: Secondary | ICD-10-CM

## 2011-06-26 DIAGNOSIS — F419 Anxiety disorder, unspecified: Secondary | ICD-10-CM

## 2011-06-26 DIAGNOSIS — E119 Type 2 diabetes mellitus without complications: Secondary | ICD-10-CM

## 2011-06-26 DIAGNOSIS — M1990 Unspecified osteoarthritis, unspecified site: Secondary | ICD-10-CM

## 2011-06-26 DIAGNOSIS — I10 Essential (primary) hypertension: Secondary | ICD-10-CM

## 2011-06-26 DIAGNOSIS — K5730 Diverticulosis of large intestine without perforation or abscess without bleeding: Secondary | ICD-10-CM

## 2011-06-26 DIAGNOSIS — E663 Overweight: Secondary | ICD-10-CM

## 2011-06-26 DIAGNOSIS — F411 Generalized anxiety disorder: Secondary | ICD-10-CM

## 2011-06-26 DIAGNOSIS — I2510 Atherosclerotic heart disease of native coronary artery without angina pectoris: Secondary | ICD-10-CM

## 2011-06-26 DIAGNOSIS — N139 Obstructive and reflux uropathy, unspecified: Secondary | ICD-10-CM

## 2011-06-26 DIAGNOSIS — K219 Gastro-esophageal reflux disease without esophagitis: Secondary | ICD-10-CM

## 2011-06-26 LAB — HEPATIC FUNCTION PANEL
Albumin: 4.5 g/dL (ref 3.5–5.2)
Alkaline Phosphatase: 59 U/L (ref 39–117)

## 2011-06-26 LAB — CBC WITH DIFFERENTIAL/PLATELET
Eosinophils Relative: 3.1 % (ref 0.0–5.0)
HCT: 44.2 % (ref 39.0–52.0)
Lymphs Abs: 2 10*3/uL (ref 0.7–4.0)
Monocytes Relative: 11 % (ref 3.0–12.0)
Neutrophils Relative %: 57.9 % (ref 43.0–77.0)
Platelets: 277 10*3/uL (ref 150.0–400.0)
WBC: 7.4 10*3/uL (ref 4.5–10.5)

## 2011-06-26 LAB — BASIC METABOLIC PANEL
Calcium: 9.7 mg/dL (ref 8.4–10.5)
GFR: 121.22 mL/min (ref >60.00)
Glucose, Bld: 107 mg/dL — ABNORMAL HIGH (ref 70–99)
Sodium: 139 mEq/L (ref 135–145)

## 2011-06-26 LAB — LIPID PANEL
HDL: 50.4 mg/dL (ref >39.00)
Total CHOL/HDL Ratio: 5
Triglycerides: 116 mg/dL (ref 0.0–149.0)
VLDL: 23.2 mg/dL (ref 0.0–40.0)

## 2011-06-26 LAB — LDL CHOLESTEROL, DIRECT: Direct LDL: 165.1 mg/dL

## 2011-06-26 LAB — PSA: PSA: 2.43 ng/mL (ref 0.10–4.00)

## 2011-06-26 NOTE — Patient Instructions (Addendum)
Today we updated your med list in our EPIC system...    Continue your current medications the same...  Today we did your follow up CXR & FASTING blood work...    We will call you w/ the results when avail...  Keep up the good work w/ your exercise program...    Let's work on a better low carb diet & wt reduction...  Call for any questions...  Let's plan a follow up recheck of your DM in 6 months.Marland KitchenMarland Kitchen

## 2011-06-28 ENCOUNTER — Other Ambulatory Visit: Payer: Self-pay | Admitting: *Deleted

## 2011-06-28 MED ORDER — ATORVASTATIN CALCIUM 80 MG PO TABS: 80.0000 mg | ORAL_TABLET | Freq: Every day | ORAL | Status: DC

## 2011-06-28 MED ORDER — METFORMIN HCL 500 MG PO TABS: 500.0000 mg | ORAL_TABLET | Freq: Two times a day (BID) | ORAL | Status: DC

## 2011-07-02 NOTE — Progress Notes (Addendum)
Subjective:     Patient ID: Kurt Kidd, male   DOB: 28-Jan-1951, 61 y.o.   MRN: [id number]  HPI 61 y/o WM here for a follow up visit... he has multiple medical problems as noted below...    ~  he has a hx of HBP and CAD w/ stent and is followed for cardiology by [facility]... he has hypercholesterolemia on Simva80... he was diagnosed w/ DM 3/09 and started on Metformin- he has since lost some weight on diet & exercise program!!!  ~  September 07, 2008:  he's had a good 6 mo- lost 10# back to 198# today... but he's notes some right lower CWP vs RUQ discomfort- not activ related, not food or intake related, & primarily occurs at bedtime.Marland Kitchen denies N/ V, change in bowels, blood etc... we discussed f/u labs (NAD- BS126, A1c6.2), Abd Sonar (fatty liver, norm GB), Rx w/ heat/ DCN100...  ~  January 24, 2010:  it's been 16months since his last check & he feels that he is stable but notes sl weight gain & sugars are up in the 200 range he says... BP controlled on meds;  no CP, palpit, SOB, etc;  he is still on Simva80 & not fasting today- we discussed ch to [id number] & ret for FLP later;  GI stable on Protonix Bid & he will be due for f/u colonoscopy 9/12;  we discussed his CAD, Chol, DM & need for more regular f/u visits.  ~  June 26, 2011:  64mo ROV & Kaiea has noted incr BS at home> see prob list below >>    HBP> on Metop50Bid; BP= 148/80 & he denies CP, palpit, dizzy, SOB, edema, etc.    CAD> on [id number]; he is too sedentary but denies angina, etc; had f/u w/ Delton See 4/12> 2DEcho showed Ao sclerosis, no stenosis, mild MR, norm LVF; he noted rapid    HR after eating which improved by adjusting his eating habits; decr alcohol & caffeine...    Hyperlipidemia> on Lip40; FLP shows TChol 230, TG 116, HDL 50, LDL 165 & he is rec to incr to Lip80 & take it every day!    DM> on Metform500Bid; BS=107, A1c=6.3; we reviewed diet, exercise & wt reduction strategies...    Overweight> wt= 199# & he knows the importance of wt  reduction...    GI> GERD, Divertics, Hx ischemic colitis> on Aciphex40Bid from DrByers for reflux espoh; he denies abd pain, n/v, d/c, blood seen..    DJD> he uses OTC analgesics as needed... We reviewed prob list, meds, xrays and labs> see below for updates >> CXR 4/13 showed heart size at upper lim norm, clear lungs, NAD... LABS 4/13:  FLP- not at goals on Lip40 w/ LDL=165;  Chems- ok x BS=107 A1c=6.3;  CBC- wnl;  TSH=1.59;  PSA=2.43   Problem List:    BRONCHITIS, ACUTE (ICD-466.0) - he is an ex-smoker, quit ~71yrs... no recent problems and the exercise program has really helped...  HYPERTENSION (ICD-401.9) - on METOPROLOL 50mg Bid... 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 ~  4/13:  BP= 148/80 & he remains largely asymptomatic...  CORONARY ARTERY DISEASE (ICD-414.00) - on ASA 325mg /d... followed by Delton See & his notes are reviewed... he has done better recently w/ secondary risk factor reduction... ~  NuclearStressTest 7/04 showed ?anteroseptal scar vs thinning, no ischemia, EF=61%... ~  initial  cath 7/04 w/ 80% LAD lesion... stent placed by DrStuckey... ~  last cath 8/08 showed non-obstructive CAD & prev stent site w/ <10% narrowing, EF=65%... ~  he continues asymptomatic w/o CP, palpit, SOB, edema, etc...  HYPERLIPIDEMIA (ICD-272.4) - on SIMVASTATIN 80mg /d... he stopped his prev Fish Oil supplements ("they gave me gas")...  ~  FLP 4/07 on diet showed TChol 271, TG 443, HDL 31, LDL 155... Rec~ restart ZOCOR 80... ~  FLP 3/09 on Simva80 showed TChol 181, TG 166, HDL 36, LDL 112... same med & get wt down (then lost 14#) ~  FLP 8/09 on Simva80 showed TChol 186, TG 254, HDL 37, LDL 127... rec- same med, better diet, may need Fibrate etc. ~  FLP 1/10 on Simva80 showed TChol 190, TG 118, HDL 39, LDL 127... rec> get wt down! ~  FLP 7/10 on  Simva80 showed TChol 179, TG 121, HDL 48, LDL 107 ~  11/11: not fasting for labs today & we decided to switch to LIPITOR 40mg /d. ~  FLP 4/13 ?on Lip40 showed TChol 230, TG 116, HDL 50, LDL 165... rec to take daily & incr to Lip80.  DIABETES MELLITUS, BORDERLINE (ICD-790.29) - started meds 3/09 w/ METFORMIN 500- now on 1Bid. ~  labs 4/07 showed FBS=134, HgA1c=6.0.Marland KitchenMarland Kitchen ~  labs 3/09 showed BS= 140, HgA1c= 7.0.Marland KitchenMarland Kitchen Metformin started ~  labs 8/09 on Metform500Bid showed BS= 147, HgA1c= 6.5 ~  labs 1/10 on Metform500Bid showed BS= 144, A1c= 6.4.Marland Kitchen. rec> keep same. ~  labs 7/10 on Metform500Bid showed BS= 126, A1c= 6.2 ~  labs 11/11 on Metform500Bid showed BS= 108, A1c= 6.3 ~  Labs 4/13 on Metform500Bid showed BS= 107, A1c= 6.3  OVERWEIGHT (ICD-278.02) - started at 212# Mar09... ~  8/09:  great job w/ diet and exercise program and weight down 14# to 198# ~  1/10:  unfortunately weight back up to 208# after the holidays. ~  7/10: weight down to 198# ~  11/11: weight = 202# ~  4/13:  Weight = 199#  Hx of GERD (ICD-530.81) - hx gastritis on EGD by [facility] 8/04... he takes NEXIUM 40mg /d, was changed to PROTONIX 40mg  Bid to save $$$ w/ his insurance...  DIVERTICULOSIS OF COLON (ICD-562.10) - colonoscopy 9/06 by Dorris Singh showed ischemic colitis- he needs a full diagnostic colonoscopy 61 y/o Bro had colon cancer... ~  f/u colonoscopy 9/09 by DrPatterson showed divertics, polyp= polypoid mucosa w/o adenomatous change... f/u planned 61yrs.  Hx of ISCHEMIC COLITIS (ICD-557.9) - hospitalized 9/06 w/ lower GIB secondary to ischemic colitis (required 2u transfusion)...   DEGENERATIVE JOINT DISEASE (ICD-715.90) & ?CTS w/ wrist pain> wrist splint Rx Qhs helps...  ONYCHOMYCOSIS (ICD-110.1) - Mar09 notes nail fungus on left hand and both feet... LAMISIL 250mg /d started... hands resolved now and toes are improved.   Past Surgical History  Procedure Date  . Angioplasty / stenting femoral 09/09/2002     Outpatient Encounter Prescriptions as of 06/26/2011  Medication Sig Dispense Refill  . aspirin 325 MG EC tablet Take 325 mg by mouth daily.        . metoprolol (LOPRESSOR) 50 MG tablet TAKE ONE TABLET BY MOUTH TWICE DAILY  60 tablet  6  . Multiple Vitamin (MULTIVITAMIN) tablet Take 1 tablet by mouth daily.        . nitroGLYCERIN (NITROSTAT) 0.4 MG SL tablet Place 0.4 mg under the tongue every 5 (five) minutes as needed.        . ONE TOUCH ULTRA TEST test strip       .  RABEprazole (ACIPHEX) 20 MG tablet Take 20 mg by mouth 2 (two) times daily.      Marland Kitchen DISCONTD: atorvastatin (LIPITOR) 80 MG tablet Take 80 mg by mouth daily.      Marland Kitchen DISCONTD: metFORMIN (GLUCOPHAGE) 500 MG tablet TAKE ONE TABLET BY MOUTH TWICE DAILY  60 tablet  6  . DISCONTD: esomeprazole (NEXIUM) 40 MG capsule Take 40 mg by mouth daily before breakfast.        . DISCONTD: HYDROcodone-homatropine (HYCODAN) 5-1.5 MG/5ML syrup as directed.      Marland Kitchen DISCONTD: HYDROcodone-homatropine (HYCODAN) 5-1.5 MG/5ML syrup Take by mouth every 6 (six) hours as needed.        Marland Kitchen DISCONTD: LIPITOR 40 MG tablet TAKE ONE TABLET BY MOUTH EVERY DAY  30 each  6  . DISCONTD: metFORMIN (GLUMETZA) 500 MG (MOD) 24 hr tablet Take 500 mg by mouth 2 (two) times daily with a meal.       . DISCONTD: metoprolol (TOPROL-XL) 50 MG 24 hr tablet Take 50 mg by mouth 2 (two) times daily.        Marland Kitchen DISCONTD: pantoprazole (PROTONIX) 40 MG tablet TAKE ONE TABLET BY MOUTH TWICE DAILY  60 tablet  6    No Known Allergies   Current Medications, Allergies, Past Medical History, Past Surgical History, Family History, and Social History were reviewed in [employer] record.   Review of Systems    The patient denies fever, chills, sweats, anorexia, fatigue, weakness, malaise, weight loss, sleep disorder, blurring, diplopia, eye irritation, eye discharge, vision loss, eye pain, photophobia, earache, ear discharge, tinnitus, decreased hearing, nasal  congestion, nosebleeds, sore throat, hoarseness, chest pain, palpitations, syncope, dyspnea on exertion, orthopnea, PND, peripheral edema, cough, dyspnea at rest, excessive sputum, hemoptysis, wheezing, pleurisy, nausea, vomiting, diarrhea, constipation, change in bowel habits, abdominal pain, melena, hematochezia, jaundice, gas/bloating, indigestion/heartburn, dysphagia, odynophagia, dysuria, hematuria, urinary frequency, urinary hesitancy, nocturia, incontinence, back pain, joint pain, joint swelling, muscle cramps, muscle weakness, stiffness, arthritis, sciatica, restless legs, leg pain at night, leg pain with exertion, rash, itching, dryness, suspicious lesions, paralysis, paresthesias, seizures, tremors, vertigo, transient blindness, frequent falls, frequent headaches, difficulty walking, depression, anxiety, memory loss, confusion, cold intolerance, heat intolerance, polydipsia, polyphagia, polyuria, unusual weight change, abnormal bruising, bleeding, enlarged lymph nodes, urticaria, allergic rash, hay fever, and recurrent infections.     Objective:   Physical Exam     WD, Overweight, 61 y/o WM in NAD...  GENERAL:  Alert & oriented; pleasant & cooperative... HEENT:  [place]/AT, EOM-wnl, PERRLA, EACs-clear, TMs-wnl, NOSE-clear, THROAT-clear & wnl. NECK:  Supple w/ fairROM; no JVD; normal carotid impulses w/o bruits; no thyromegaly or nodules palpated; no lymphadenopathy. CHEST:  Clear to P & A; without wheezes/ rales/ or rhonchi. HEART:  Regular Rhythm;  gr 1/6 SEM without rubs or gallops detected... ABDOMEN:  Soft & min tender in RUQ; normal bowel sounds; no organomegaly or masses palpated... EXT: without deformities, mild arthritic changes; no varicose veins/ venous insuffic/ or edema. NEURO:  CN's intact;  no focal neuro deficits... DERM:  onychomycosis improved as noted...  RADIOLOGY DATA:  Reviewed in the EPIC EMR & discussed w/ the patient...  LABORATORY DATA:  Reviewed in the EPIC EMR &  discussed w/ the patient...   Assessment:     HBP> on Metop50Bid; well controlled- continue same med + low sodium etc...  CAD> on ASA325; he is too sedentary but denies angina, etc; needs to incr exercise program...  Hyperlipidemia> on Lip40; FLP shows TChol 230, TG  116, HDL 50, LDL 165 & he is rec to incr to Lip80 & take it every day!  DM> on Metform500Bid; BS=107, A1c=6.3; we reviewed diet, exercise & wt reduction strategies...  Overweight> wt= 199# & he knows the importance of wt reduction...  GI> GERD, Divertics, Hx ischemic colitis> on Aciphex40Bid from DrByers for reflux esoph; continue same.  DJD> he uses OTC analgesics as needed...     Plan:     Patient's Medications  New Prescriptions   No medications on file  Previous Medications   ASPIRIN 325 MG EC TABLET    Take 325 mg by mouth daily.     METOPROLOL (LOPRESSOR) 50 MG TABLET    TAKE ONE TABLET BY MOUTH TWICE DAILY   MULTIPLE VITAMIN (MULTIVITAMIN) TABLET    Take 1 tablet by mouth daily.     NITROGLYCERIN (NITROSTAT) 0.4 MG SL TABLET    Place 0.4 mg under the tongue every 5 (five) minutes as needed.     ONE TOUCH ULTRA TEST TEST STRIP       RABEPRAZOLE (ACIPHEX) 20 MG TABLET    Take 20 mg by mouth 2 (two) times daily.  Modified Medications   Modified Medication Previous Medication   ATORVASTATIN (LIPITOR) 80 MG TABLET atorvastatin (LIPITOR) 80 MG tablet      Take 1 tablet (80 mg total) by mouth daily.    Take 80 mg by mouth daily.   METFORMIN (GLUCOPHAGE) 500 MG TABLET metFORMIN (GLUCOPHAGE) 500 MG tablet      Take 1 tablet (500 mg total) by mouth 2 (two) times daily with a meal.    TAKE ONE TABLET BY MOUTH TWICE DAILY  Discontinued Medications   ESOMEPRAZOLE (NEXIUM) 40 MG CAPSULE    Take 40 mg by mouth daily before breakfast.     HYDROCODONE-HOMATROPINE (HYCODAN) 5-1.5 MG/5ML SYRUP    as directed.   HYDROCODONE-HOMATROPINE (HYCODAN) 5-1.5 MG/5ML SYRUP    Take by mouth every 6 (six) hours as needed.     LIPITOR 40  MG TABLET    TAKE ONE TABLET BY MOUTH EVERY DAY   METFORMIN (GLUMETZA) 500 MG (MOD) 24 HR TABLET    Take 500 mg by mouth 2 (two) times daily with a meal.    METOPROLOL (TOPROL-XL) 50 MG 24 HR TABLET    Take 50 mg by mouth 2 (two) times daily.     PANTOPRAZOLE (PROTONIX) 40 MG TABLET    TAKE ONE TABLET BY MOUTH TWICE DAILY

## 2011-09-03 ENCOUNTER — Telehealth: Payer: Self-pay | Admitting: Pulmonary Disease

## 2011-09-03 NOTE — Telephone Encounter (Signed)
lmomtcb x1--this was d/c'd 06/26/11

## 2011-09-04 MED ORDER — ESOMEPRAZOLE MAGNESIUM 40 MG PO CPDR: 40.0000 mg | DELAYED_RELEASE_CAPSULE | Freq: Two times a day (BID) | ORAL | Status: DC

## 2011-09-04 NOTE — Telephone Encounter (Signed)
Per SN---ok to send in nexium 40mg   1 po bid ---will his insurance cover the nexium at bid?  Otherwise it might be expensive.  thanks

## 2011-09-04 NOTE — Telephone Encounter (Signed)
I spoke with pt and he would like rx called into [facility]--he will see how much this will cost when he picks it up from the pharmacy bc he states this works much better than the aciphex. rx has been sent to the pharmacy

## 2011-09-04 NOTE — Telephone Encounter (Signed)
I spoke with the pt wife and she states that the pt wants to change back to nexium because he felt it worked better then aciphex. He is requesting it be sent for twice a day. Please advise if ok to change pt back to nexium. Carron Curie, CMA

## 2011-09-11 ENCOUNTER — Other Ambulatory Visit: Payer: Self-pay | Admitting: Pulmonary Disease

## 2011-11-26 ENCOUNTER — Encounter: Payer: Self-pay | Admitting: Gastroenterology

## 2011-12-25 ENCOUNTER — Encounter: Payer: Self-pay | Admitting: *Deleted

## 2011-12-26 ENCOUNTER — Encounter: Payer: Self-pay | Admitting: Pulmonary Disease

## 2011-12-26 ENCOUNTER — Ambulatory Visit (INDEPENDENT_AMBULATORY_CARE_PROVIDER_SITE_OTHER): Payer: BC Managed Care – PPO | Admitting: Pulmonary Disease

## 2011-12-26 ENCOUNTER — Other Ambulatory Visit (INDEPENDENT_AMBULATORY_CARE_PROVIDER_SITE_OTHER): Payer: BC Managed Care – PPO

## 2011-12-26 VITALS — BP 130/72 | HR 65 | Temp 97.5°F | Ht 66.0 in | Wt 197.2 lb

## 2011-12-26 DIAGNOSIS — E785 Hyperlipidemia, unspecified: Secondary | ICD-10-CM

## 2011-12-26 DIAGNOSIS — E663 Overweight: Secondary | ICD-10-CM

## 2011-12-26 DIAGNOSIS — I2510 Atherosclerotic heart disease of native coronary artery without angina pectoris: Secondary | ICD-10-CM

## 2011-12-26 DIAGNOSIS — I10 Essential (primary) hypertension: Secondary | ICD-10-CM

## 2011-12-26 DIAGNOSIS — K559 Vascular disorder of intestine, unspecified: Secondary | ICD-10-CM

## 2011-12-26 DIAGNOSIS — I340 Nonrheumatic mitral (valve) insufficiency: Secondary | ICD-10-CM

## 2011-12-26 DIAGNOSIS — M1990 Unspecified osteoarthritis, unspecified site: Secondary | ICD-10-CM

## 2011-12-26 DIAGNOSIS — K219 Gastro-esophageal reflux disease without esophagitis: Secondary | ICD-10-CM

## 2011-12-26 DIAGNOSIS — Z23 Encounter for immunization: Secondary | ICD-10-CM

## 2011-12-26 DIAGNOSIS — I059 Rheumatic mitral valve disease, unspecified: Secondary | ICD-10-CM

## 2011-12-26 DIAGNOSIS — E119 Type 2 diabetes mellitus without complications: Secondary | ICD-10-CM

## 2011-12-26 DIAGNOSIS — K5790 Diverticulosis of intestine, part unspecified, without perforation or abscess without bleeding: Secondary | ICD-10-CM

## 2011-12-26 LAB — BASIC METABOLIC PANEL
BUN: 12 mg/dL (ref 6–23)
Calcium: 9.7 mg/dL (ref 8.4–10.5)
Creatinine, Ser: 0.7 mg/dL (ref 0.4–1.5)
GFR: 113.5 mL/min (ref >60.00)

## 2011-12-26 LAB — LIPID PANEL
Cholesterol: 220 mg/dL — ABNORMAL HIGH (ref 0–200)
HDL: 42.3 mg/dL (ref >39.00)
Triglycerides: 138 mg/dL (ref 0.0–149.0)
VLDL: 27.6 mg/dL (ref 0.0–40.0)

## 2011-12-26 LAB — HEPATIC FUNCTION PANEL: Total Bilirubin: 0.8 mg/dL (ref 0.3–1.2)

## 2011-12-26 LAB — LDL CHOLESTEROL, DIRECT: Direct LDL: 163.5 mg/dL

## 2011-12-26 MED ORDER — ESOMEPRAZOLE MAGNESIUM 40 MG PO CPDR: 40.0000 mg | DELAYED_RELEASE_CAPSULE | Freq: Two times a day (BID) | ORAL | Status: DC

## 2011-12-26 NOTE — Patient Instructions (Addendum)
Today we updated your med list in our EPIC system...    Continue your current medications the same...    We refilled the meds you requested...  Today we rechecked your Cholesterol labs...    We will call you w/ the results when avail...  We gave you the 2013 flu vaccine today...  We discussed moderation on the beer & any caffeine...  Call for any questions...  Let's continue our 6 month check ups.Kurt KitchenMarland Kidd

## 2011-12-26 NOTE — Progress Notes (Signed)
Subjective:     Patient ID: Kurt Kidd, male   DOB: 01-13-1951, 61 y.o.   MRN: [id number]  HPI 61 y/o WM here for a follow up visit... he has multiple medical problems as noted below...    ~  he has a hx of HBP and CAD w/ stent and is followed for cardiology by [facility]... he has hypercholesterolemia prev on Simva80... he was diagnosed w/ DM 3/09 and started on Metformin- he has since lost some weight on diet & exercise program!!!  ~  January 24, 2010:  it's been 164months since his last check & he feels that he is stable but notes sl weight gain & sugars are up in the 200 range he says... BP controlled on meds;  no CP, palpit, SOB, etc;  he is still on Simva80 & not fasting today- we discussed ch to [id number] & ret for FLP later;  GI stable on Protonix Bid & he will be due for f/u colonoscopy 9/12;  we discussed his CAD, Chol, DM & need for more regular f/u visits.  ~  June 26, 2011:  64mo ROV & Mir has noted incr BS at home> see prob list below >>     HBP> on Metop50Bid; BP= 148/80 & he denies CP, palpit, dizzy, SOB, edema, etc.     CAD> on [id number]; he is too sedentary but denies angina, etc; had f/u w/ Delton See 4/12> 2DEcho showed Ao sclerosis, no stenosis, mild MR, norm LVF; he noted rapid HR after eating which improved by adjusting his eating habits; decr alcohol & caffeine...     Hyperlipidemia> on Lip40; FLP shows TChol 230, TG 116, HDL 50, LDL 165 & he is rec to incr to Lip80 & take it every day!     DM> on Metform500Bid; BS=107, A1c=6.3; we reviewed diet, exercise & wt reduction strategies...     Overweight> wt= 199# & he knows the importance of wt reduction...     GI> GERD, Divertics, Hx ischemic colitis> on Aciphex40Bid from DrByers for reflux espoh; he denies abd pain, n/v, d/c, blood seen..     DJD> he uses OTC analgesics as needed...  We reviewed prob list, meds, xrays and labs> see below for updates >>  CXR 4/13 showed heart size at upper lim norm, clear lungs, NAD...  LABS  4/13: FLP- not at goals on Lip40 w/ LDL=165; Chems- ok x BS=107 A1c=6.3; CBC- wnl; TSH=1.59; PSA=2.43   ~  December 26, 2011:  64mo ROV & [name] wants to know which of his meds reacts w/ beer (usually drinks 1-2 on weekends, but had 6-7 one day at the beach & became jittery, nervous, shaking- last 2H & resolved spont, BS was 142); I told him to just stop drinking that much beer...    HBP> on Metop50Bid; BP= 130/72 & he denies CP, palpit, dizzy, SOB, edema, etc.    CAD> on [id number]; he is too sedentary but denies angina, etc; had f/u w/ Delton See 4/12> 2DEcho showed Ao sclerosis, no stenosis, mild MR, norm LVF; he noted rapid    HR after eating which improved by adjusting his eating habits; decr alcohol & caffeine...    Hyperlipidemia> on Lip80 now ?compliance; FLP shows TChol 220, TG 138, HDL 42, LDL 163; needs to take Lip80 DAILY & needs ZETIA10 added.    DM> on Metform500Bid; BS=110, A1c= wasn't run; we reviewed diet, exercise & wt reduction strategies...    Overweight> wt= 197# & he knows the importance of wt reduction.Marland KitchenMarland Kitchen  GI> GERD, Divertics, Hx ischemic colitis> on Nexium40Bid from DrByers for reflux espoh; he denies abd pain, n/v, d/c, blood seen..    DJD> he uses OTC analgesics as needed... We reviewed prob list, meds, xrays and labs> see below for updates >> OK Flu shot today... LABS 10/13:  FLP- not at goals on Lip80 w/ LDL=163;  Chems- ok...   Problem List:    BRONCHITIS, ACUTE (ICD-466.0) - he is an ex-smoker, quit ~55yrs... no recent problems and the exercise program has really helped...  HYPERTENSION (ICD-401.9) - on METOPROLOL 50mg Bid... 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 ~  4/13:  BP= 148/80 & he remains largely asymptomatic; CXR 4/13 showed heart size at upper lim norm, clear lungs, NAD...  ~  10/13:  BP= 130/72 & he continues to  deny CP, palpt, SOB, edema, etc...  CORONARY ARTERY DISEASE (ICD-414.00) - on ASA 325mg /d... followed by Delton See & his notes are reviewed... he has done better recently w/ secondary risk factor reduction... ~  NuclearStressTest 7/04 showed ?anteroseptal scar vs thinning, no ischemia, EF=61%... ~  initial cath 7/04 w/ 80% LAD lesion... stent placed by DrStuckey... ~  last cath 8/08 showed non-obstructive CAD & prev stent site w/ <10% narrowing, EF=65%... ~  he continues asymptomatic w/o CP, palpit, SOB, edema, etc...  HYPERLIPIDEMIA (ICD-272.4) - on LIPITOR 80mg /d now... he stopped his prev Fish Oil supplements ("they gave me gas")...  ~  FLP 4/07 on diet showed TChol 271, TG 443, HDL 31, LDL 155... Rec~ restart ZOCOR 80... ~  FLP 3/09 on Simva80 showed TChol 181, TG 166, HDL 36, LDL 112... same med & get wt down (then lost 14#) ~  FLP 8/09 on Simva80 showed TChol 186, TG 254, HDL 37, LDL 127... rec- same med, better diet, may need Fibrate etc. ~  FLP 1/10 on Simva80 showed TChol 190, TG 118, HDL 39, LDL 127... rec> get wt down! ~  FLP 7/10 on Simva80 showed TChol 179, TG 121, HDL 48, LDL 107 ~  11/11: not fasting for labs today & we decided to switch to LIPITOR 40mg /d. ~  FLP 4/13 ?on Lip40 showed TChol 230, TG 116, HDL 50, LDL 165... rec to take daily & incr to Lip80. ~  FLP 10/13 on Lip80 showed TChol 220, TG 138, HDL , LDL 163...  Known CAD w/ stent, Add ZETIA10.  DIABETES MELLITUS, BORDERLINE (ICD-790.29) - started meds 3/09 w/ METFORMIN 500- now on 1Bid. ~  labs 4/07 showed FBS=134, HgA1c=6.0.Marland KitchenMarland Kitchen ~  labs 3/09 showed BS= 140, HgA1c= 7.0.Marland KitchenMarland Kitchen Metformin started ~  labs 8/09 on Metform500Bid showed BS= 147, HgA1c= 6.5 ~  labs 1/10 on Metform500Bid showed BS= 144, A1c= 6.4.Marland Kitchen. rec> keep same. ~  labs 7/10 on Metform500Bid showed BS= 126, A1c= 6.2 ~  labs 11/11 on Metform500Bid showed BS= 108, A1c= 6.3 ~  Labs 4/13 on Metform500Bid showed BS= 107, A1c= 6.3 ~  Labs 10/13 on Metform500Bid showed  BS= 110, A1c= not done.  OVERWEIGHT (ICD-278.02) - started at 212# Mar09... ~  8/09:  great job w/ diet and exercise program and weight down 14# to 198# ~  1/10:  unfortunately weight back up to 208# after the holidays. ~  7/10: weight down to 198# ~  11/11: weight = 202# ~  4/13: Weight = 199# ~  10/13: Weight = 197#  Hx of GERD (  ICD-530.81) - hx gastritis on EGD by [facility] 8/04... he takes NEXIUM 40mg Bid & denies dysphagia, abd pain, GERD, n/ v/ etc...  DIVERTICULOSIS OF COLON (ICD-562.10) - colonoscopy 9/06 by Dorris Singh showed ischemic colitis- he needs a full diagnostic colonoscopy 61 y/o Bro had colon cancer... ~  f/u colonoscopy 9/09 by DrPatterson showed divertics, polyp= polypoid mucosa w/o adenomatous change... f/u planned 48yrs.  Hx of ISCHEMIC COLITIS (ICD-557.9) - hospitalized 9/06 w/ lower GIB secondary to ischemic colitis (required 2u transfusion)...   DEGENERATIVE JOINT DISEASE (ICD-715.90) & ?CTS w/ wrist pain> wrist splint Rx Qhs helps...  ONYCHOMYCOSIS (ICD-110.1) - Mar09 notes nail fungus on left hand and both feet... LAMISIL 250mg /d started... hands resolved now and toes are improved.   Past Surgical History  Procedure Date  . Angioplasty / stenting femoral 09/09/2002    Outpatient Encounter Prescriptions as of 12/26/2011  Medication Sig Dispense Refill  . aspirin 325 MG EC tablet Take 325 mg by mouth daily.        Marland Kitchen atorvastatin (LIPITOR) 80 MG tablet Take 1 tablet (80 mg total) by mouth daily.  90 tablet  3  . esomeprazole (NEXIUM) 40 MG capsule Take 1 capsule (40 mg total) by mouth 2 (two) times daily.  60 capsule  3  . metFORMIN (GLUCOPHAGE) 500 MG tablet Take 1 tablet (500 mg total) by mouth 2 (two) times daily with a meal.  180 tablet  3  . metoprolol (LOPRESSOR) 50 MG tablet TAKE ONE TABLET BY MOUTH TWICE DAILY  60 tablet  5  . Multiple Vitamin (MULTIVITAMIN) tablet Take 1 tablet by mouth daily.        . nitroGLYCERIN (NITROSTAT) 0.4 MG SL tablet Place  0.4 mg under the tongue every 5 (five) minutes as needed.        . ONE TOUCH ULTRA TEST test strip       . DISCONTD: RABEprazole (ACIPHEX) 20 MG tablet Take 20 mg by mouth 2 (two) times daily.        No Known Allergies   Current Medications, Allergies, Past Medical History, Past Surgical History, Family History, and Social History were reviewed in [employer] record.   Review of Systems    The patient denies fever, chills, sweats, anorexia, fatigue, weakness, malaise, weight loss, sleep disorder, blurring, diplopia, eye irritation, eye discharge, vision loss, eye pain, photophobia, earache, ear discharge, tinnitus, decreased hearing, nasal congestion, nosebleeds, sore throat, hoarseness, chest pain, palpitations, syncope, dyspnea on exertion, orthopnea, PND, peripheral edema, cough, dyspnea at rest, excessive sputum, hemoptysis, wheezing, pleurisy, nausea, vomiting, diarrhea, constipation, change in bowel habits, abdominal pain, melena, hematochezia, jaundice, gas/bloating, indigestion/heartburn, dysphagia, odynophagia, dysuria, hematuria, urinary frequency, urinary hesitancy, nocturia, incontinence, back pain, joint pain, joint swelling, muscle cramps, muscle weakness, stiffness, arthritis, sciatica, restless legs, leg pain at night, leg pain with exertion, rash, itching, dryness, suspicious lesions, paralysis, paresthesias, seizures, tremors, vertigo, transient blindness, frequent falls, frequent headaches, difficulty walking, depression, anxiety, memory loss, confusion, cold intolerance, heat intolerance, polydipsia, polyphagia, polyuria, unusual weight change, abnormal bruising, bleeding, enlarged lymph nodes, urticaria, allergic rash, hay fever, and recurrent infections.     Objective:   Physical Exam     WD, Overweight, 61 y/o WM in NAD...  GENERAL:  Alert & oriented; pleasant & cooperative... HEENT:  [place]/AT, EOM-wnl, PERRLA, EACs-clear, TMs-wnl, NOSE-clear,  THROAT-clear & wnl. NECK:  Supple w/ fairROM; no JVD; normal carotid impulses w/o bruits; no thyromegaly or nodules palpated; no lymphadenopathy. CHEST:  Clear to P & A; without  wheezes/ rales/ or rhonchi. HEART:  Regular Rhythm;  gr 1/6 SEM without rubs or gallops detected... ABDOMEN:  Soft & min tender in RUQ; normal bowel sounds; no organomegaly or masses palpated... EXT: without deformities, mild arthritic changes; no varicose veins/ venous insuffic/ or edema. NEURO:  CN's intact;  no focal neuro deficits... DERM:  onychomycosis improved as noted...  RADIOLOGY DATA:  Reviewed in the EPIC EMR & discussed w/ the patient...  LABORATORY DATA:  Reviewed in the EPIC EMR & discussed w/ the patient...   Assessment:      HBP> on Metop50Bid; well controlled- continue same med + low sodium etc...  CAD> on ASA325; he is too sedentary but denies angina, etc; needs to incr exercise program...  Hyperlipidemia> on Lip80; FLP shows LDL=126 & goal w/ his CAD/ stent <70 therefore add ZETIA10.  DM> on Metform500Bid; BS=89, A1c= wasn't done; we reviewed diet, exercise & wt reduction strategies...  Overweight> wt= 197# & he knows the importance of wt reduction...  GI> GERD, Divertics, Hx ischemic colitis> on Nexium40Bid for reflux esoph; continue same.  DJD> he uses OTC analgesics as needed...  B12 Deficiency>       Plan:      Patient's Medications  New Prescriptions   No medications on file  Previous Medications   ASPIRIN 325 MG EC TABLET    Take 325 mg by mouth daily.     ATORVASTATIN (LIPITOR) 80 MG TABLET    Take 1 tablet (80 mg total) by mouth daily.   METFORMIN (GLUCOPHAGE) 500 MG TABLET    Take 1 tablet (500 mg total) by mouth 2 (two) times daily with a meal.   METOPROLOL (LOPRESSOR) 50 MG TABLET    TAKE ONE TABLET BY MOUTH TWICE DAILY   MULTIPLE VITAMIN (MULTIVITAMIN) TABLET    Take 1 tablet by mouth daily.     NITROGLYCERIN (NITROSTAT) 0.4 MG SL TABLET    Place 0.4 mg under the  tongue every 5 (five) minutes as needed.     ONE TOUCH ULTRA TEST TEST STRIP      Modified Medications   Modified Medication Previous Medication   ESOMEPRAZOLE (NEXIUM) 40 MG CAPSULE esomeprazole (NEXIUM) 40 MG capsule      Take 1 capsule (40 mg total) by mouth 2 (two) times daily.    Take 1 capsule (40 mg total) by mouth 2 (two) times daily.  Discontinued Medications   RABEPRAZOLE (ACIPHEX) 20 MG TABLET    Take 20 mg by mouth 2 (two) times daily.

## 2012-01-01 ENCOUNTER — Other Ambulatory Visit: Payer: Self-pay | Admitting: Pulmonary Disease

## 2012-01-01 MED ORDER — EZETIMIBE 10 MG PO TABS: 10.0000 mg | ORAL_TABLET | Freq: Every day | ORAL | Status: DC

## 2012-03-04 ENCOUNTER — Encounter: Payer: Self-pay | Admitting: Gastroenterology

## 2012-03-05 HISTORY — PX: COLONOSCOPY: SHX174

## 2012-03-10 ENCOUNTER — Telehealth: Payer: Self-pay | Admitting: Pulmonary Disease

## 2012-03-10 DIAGNOSIS — E785 Hyperlipidemia, unspecified: Secondary | ICD-10-CM

## 2012-03-10 DIAGNOSIS — K5730 Diverticulosis of large intestine without perforation or abscess without bleeding: Secondary | ICD-10-CM

## 2012-03-10 NOTE — Telephone Encounter (Signed)
Called and spoke with pt and he is requesting to see Dr. Gaye Alken in [place] for routine colon that is due.  Order has been placed and pt is aware that we will call him with this appt.

## 2012-03-11 ENCOUNTER — Encounter: Payer: Self-pay | Admitting: Gastroenterology

## 2012-03-15 ENCOUNTER — Other Ambulatory Visit: Payer: Self-pay | Admitting: Pulmonary Disease

## 2012-04-09 ENCOUNTER — Encounter: Payer: BC Managed Care – PPO | Admitting: Gastroenterology

## 2012-04-12 ENCOUNTER — Other Ambulatory Visit: Payer: Self-pay | Admitting: Pulmonary Disease

## 2012-04-15 ENCOUNTER — Ambulatory Visit: Payer: Self-pay | Admitting: General Surgery

## 2012-04-15 MED ORDER — RABEPRAZOLE SODIUM 20 MG PO TBEC: 20.0000 mg | DELAYED_RELEASE_TABLET | Freq: Two times a day (BID) | ORAL | Status: DC

## 2012-06-09 ENCOUNTER — Encounter: Payer: Self-pay | Admitting: Pulmonary Disease

## 2012-06-26 ENCOUNTER — Ambulatory Visit (INDEPENDENT_AMBULATORY_CARE_PROVIDER_SITE_OTHER): Payer: BC Managed Care – PPO | Admitting: Pulmonary Disease

## 2012-06-26 ENCOUNTER — Other Ambulatory Visit (INDEPENDENT_AMBULATORY_CARE_PROVIDER_SITE_OTHER): Payer: BC Managed Care – PPO

## 2012-06-26 ENCOUNTER — Encounter: Payer: Self-pay | Admitting: Pulmonary Disease

## 2012-06-26 VITALS — BP 124/82 | HR 61 | Temp 98.2°F | Ht 66.0 in | Wt 193.0 lb

## 2012-06-26 DIAGNOSIS — K5730 Diverticulosis of large intestine without perforation or abscess without bleeding: Secondary | ICD-10-CM

## 2012-06-26 DIAGNOSIS — F411 Generalized anxiety disorder: Secondary | ICD-10-CM

## 2012-06-26 DIAGNOSIS — E119 Type 2 diabetes mellitus without complications: Secondary | ICD-10-CM

## 2012-06-26 DIAGNOSIS — E785 Hyperlipidemia, unspecified: Secondary | ICD-10-CM

## 2012-06-26 DIAGNOSIS — K5790 Diverticulosis of intestine, part unspecified, without perforation or abscess without bleeding: Secondary | ICD-10-CM

## 2012-06-26 DIAGNOSIS — N320 Bladder-neck obstruction: Secondary | ICD-10-CM

## 2012-06-26 DIAGNOSIS — K219 Gastro-esophageal reflux disease without esophagitis: Secondary | ICD-10-CM

## 2012-06-26 DIAGNOSIS — E663 Overweight: Secondary | ICD-10-CM

## 2012-06-26 DIAGNOSIS — I10 Essential (primary) hypertension: Secondary | ICD-10-CM

## 2012-06-26 DIAGNOSIS — M1990 Unspecified osteoarthritis, unspecified site: Secondary | ICD-10-CM

## 2012-06-26 DIAGNOSIS — I2510 Atherosclerotic heart disease of native coronary artery without angina pectoris: Secondary | ICD-10-CM

## 2012-06-26 LAB — TSH: TSH: 1.82 u[IU]/mL (ref 0.35–5.50)

## 2012-06-26 LAB — LDL CHOLESTEROL, DIRECT: Direct LDL: 173.7 mg/dL

## 2012-06-26 LAB — LIPID PANEL
Cholesterol: 227 mg/dL — ABNORMAL HIGH (ref 0–200)
HDL: 42.7 mg/dL
Total CHOL/HDL Ratio: 5
Triglycerides: 136 mg/dL (ref 0.0–149.0)
VLDL: 27.2 mg/dL (ref 0.0–40.0)

## 2012-06-26 LAB — CBC WITH DIFFERENTIAL/PLATELET
Eosinophils Relative: 3 % (ref 0.0–5.0)
HCT: 41.2 % (ref 39.0–52.0)
Hemoglobin: 14.3 g/dL (ref 13.0–17.0)
Lymphs Abs: 2 10*3/uL (ref 0.7–4.0)
MCV: 84.8 fl (ref 78.0–100.0)
Monocytes Absolute: 0.8 10*3/uL (ref 0.1–1.0)
Neutro Abs: 4.3 10*3/uL (ref 1.4–7.7)
Platelets: 313 10*3/uL (ref 150.0–400.0)
WBC: 7.5 10*3/uL (ref 4.5–10.5)

## 2012-06-26 LAB — BASIC METABOLIC PANEL
BUN: 10 mg/dL (ref 6–23)
Chloride: 99 mEq/L (ref 96–112)
Creatinine, Ser: 0.7 mg/dL (ref 0.4–1.5)
Glucose, Bld: 111 mg/dL — ABNORMAL HIGH (ref 70–99)
Potassium: 5 mEq/L (ref 3.5–5.1)

## 2012-06-26 LAB — URINALYSIS
Bilirubin Urine: NEGATIVE
Ketones, ur: NEGATIVE
Leukocytes, UA: NEGATIVE
Specific Gravity, Urine: 1.025 (ref 1.000–1.030)
Urine Glucose: NEGATIVE
pH: 6 (ref 5.0–8.0)

## 2012-06-26 LAB — HEPATIC FUNCTION PANEL
ALT: 32 U/L (ref 0–53)
AST: 22 U/L (ref 0–37)
Albumin: 4.2 g/dL (ref 3.5–5.2)

## 2012-06-26 LAB — PSA: PSA: 2.79 ng/mL (ref 0.10–4.00)

## 2012-06-26 MED ORDER — TAMSULOSIN HCL 0.4 MG PO CAPS: 0.4000 mg | ORAL_CAPSULE | Freq: Every day | ORAL | Status: DC

## 2012-06-26 NOTE — Progress Notes (Signed)
Subjective:     Patient ID: Kurt Kidd, male   DOB: 1950-04-02, 62 y.o.   MRN: [id number]  HPI 62 y/o WM here for a follow up visit... he has multiple medical problems as noted below...    ~  June 26, 2011:  38mo ROV & Camar has noted incr BS at home> see prob list below >>     HBP> on Metop50Bid; BP= 148/80 & he denies CP, palpit, dizzy, SOB, edema, etc.     CAD> on ASA325; he is too sedentary but denies angina, etc; had f/u w/ Delton See 4/12> 2DEcho showed Ao sclerosis, no stenosis, mild MR, norm LVF; he noted rapid HR after eating which improved by adjusting his eating habits; decr alcohol & caffeine...     Hyperlipidemia> on Lip40; FLP shows TChol 230, TG 116, HDL 50, LDL 165 & he is rec to incr to Lip80 & take it every day!     DM> on Metform500Bid; BS=107, A1c=6.3; we reviewed diet, exercise & wt reduction strategies...     Overweight> wt= 199# & he knows the importance of wt reduction...     GI> GERD, Divertics, Hx ischemic colitis> on Aciphex40Bid from DrByers for reflux espoh; he denies abd pain, n/v, d/c, blood seen..     DJD> he uses OTC analgesics as needed...  We reviewed prob list, meds, xrays and labs> see below for updates >>  CXR 4/13 showed heart size at upper lim norm, clear lungs, NAD...  LABS 4/13: FLP- not at goals on Lip40 w/ LDL=165; Chems- ok x BS=107 A1c=6.3; CBC- wnl; TSH=1.59; PSA=2.43   ~  December 26, 2011:  37mo ROV & [name] wants to know which of his meds reacts w/ beer (usually drinks 1-2 on weekends, but had 6-7 one day at the beach & became jittery, nervous, shaking- last 2H & resolved spont, BS was 142); I told him to just stop drinking that much beer...    HBP> on Metop50Bid; BP= 130/72 & he denies CP, palpit, dizzy, SOB, edema, etc.    CAD> on [id number]; he is too sedentary but denies angina, etc; had f/u w/ Delton See 4/12> 2DEcho showed Ao sclerosis, no stenosis, mild MR, norm LVF; he noted rapid    HR after eating which improved by adjusting his eating habits;  decr alcohol & caffeine...    Hyperlipidemia> on Lip80 now ?compliance; FLP shows TChol 220, TG 138, HDL 42, LDL 163; needs to take Lip80 DAILY & needs ZETIA10 added.    DM> on Metform500Bid; BS=110, A1c= wasn't run; we reviewed diet, exercise & wt reduction strategies...    Overweight> wt= 197# & he knows the importance of wt reduction...    GI> GERD, Divertics, Hx ischemic colitis> on Nexium40Bid from DrByers for reflux espoh; he denies abd pain, n/v, d/c, blood seen..    DJD> he uses OTC analgesics as needed... We reviewed prob list, meds, xrays and labs> see below for updates >> OK Flu shot today... LABS 10/13:  FLP- not at goals on Lip80 w/ LDL=163;  Chems- ok...  ~  June 26, 2012:  37mo ROV & [name] says he feels bad AFTER he eats & we discussed low carb/ low fat diet, smaller portions, etc;  Also c/o weak stream, dribbles, denies incont, bro-in-law dx w/ prostate ca- we discussed trial Flomax & check PSA=2.79;  He also had f/u colonoscopy by DrByrnett in [place] 2/14= wnl, neg, no lesions seen...      HBP> on Metop50Bid; BP= 124/82 & he  denies CP, palpit, dizzy, SOB, edema, etc.    CAD> on ASA325; he is too sedentary but denies angina, etc; had f/u w/ [facility] 4/12> 2DEcho showed Ao sclerosis, no stenosis, mild MR, norm LVF; he noted rapid HR after eating which improved by adjusting his eating habits, decr alcohol & caffeine...    Hyperlipidemia> on Lip80 ?compliance (NOT taking Zetia); FLP shows TChol 227, TG 136, HDL 43, LDL 173; needs to take Lip80 DAILY & needs ZETIA10 added but he declines due to $$...    DM> on Metform500Bid; BS=111, A1c= 6.1; we reviewed diet, exercise & wt reduction strategies...    Overweight> wt= 193# which is down 4#- keep up the good work...    GI- GERD, Divertics, Hx ischemic colitis> prev on NexiumBid from DrByers for reflux esoph but he stopped on his own & taking Omep20 instead; he denies abd pain, n/v, d/c, blood seen; his Bro had colon cancer- f/u colonoscopy  in [facility] 2/14 was neg- wnl & f/u planned 58yrs...    GU- BPH> not currently on meds; c/o weak stream, dribbles, denies incont, bro-in-law dx w/ prostate ca- we discussed trial Flomax0.4/d & check PSA=2.79...    DJD> he uses OTC analgesics as needed...  We reviewed prob list, meds, xrays and labs> see below for updates >>  LABS 4/14:  FLP- not at goals on Lip80;  Chems- ok w/ BS=111, A1c=6.1;  CBC- wnl;  TSH=1.82;  PSA=2.79;  UA- clear...          Problem List:    BRONCHITIS, ACUTE (ICD-466.0) - he is an ex-smoker, quit ~71yrs... no recent problems and the exercise program has really helped... ~  CXR 4/13 showed heart at upper lim normal, clear lungs, NAD...  HYPERTENSION (ICD-401.9) - on METOPROLOL 50mg Bid... 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 ~  4/13:  BP= 148/80 & he remains largely asymptomatic; CXR 4/13 showed heart size at upper lim norm, clear lungs, NAD...  ~  10/13:  BP= 130/72 & he continues to deny CP, palpt, SOB, edema, etc... ~  4/14:  on Metop50Bid; BP= 124/82 & he denies CP, palpit, dizzy, SOB, edema, etc...  CORONARY ARTERY DISEASE (ICD-414.00) - on ASA 325mg /d... followed by Delton See & his notes are reviewed... he has done better recently w/ secondary risk factor reduction... ~  NuclearStressTest 7/04 showed ?anteroseptal scar vs thinning, no ischemia, EF=61%... ~  initial cath 7/04 w/ 80% LAD lesion... stent placed by DrStuckey... ~  last cath 8/08 showed non-obstructive CAD & prev stent site w/ <10% narrowing, EF=65%... ~  he continues asymptomatic w/o CP, palpit, SOB, edema, etc => still w/ mult risk factors & hi risk pt therefore rec f/u w/ Cards for f/u screening...  HYPERLIPIDEMIA (ICD-272.4) - on LIPITOR 80mg /d now... he stopped his prev Fish Oil supplements ("they gave me gas")...  ~  FLP 4/07 on diet showed  TChol 271, TG 443, HDL 31, LDL 155... Rec~ restart ZOCOR 80... ~  FLP 3/09 on Simva80 showed TChol 181, TG 166, HDL 36, LDL 112... same med & get wt down (then lost 14#) ~  FLP 8/09 on Simva80 showed TChol 186, TG 254, HDL 37, LDL 127... rec- same med, better diet, may need Fibrate etc. ~  FLP 1/10 on Simva80 showed TChol 190, TG 118, HDL 39, LDL 127... rec> get wt down! ~  FLP 7/10  on Simva80 showed TChol 179, TG 121, HDL 48, LDL 107 ~  11/11: not fasting for labs today & we decided to switch to LIPITOR 40mg /d. ~  FLP 4/13 ?on Lip40 showed TChol 230, TG 116, HDL 50, LDL 165... rec to take daily & incr to Lip80. ~  FLP 10/13 on Lip80 showed TChol 220, TG 138, HDL , LDL 163...  Known CAD w/ stent, Add ZETIA10 => he never refilled this Rx due to $$... ~  FLP 4/14 on Lip80 showed TChol 227, TG 136, HDL 43, LDL 173...  DIABETES MELLITUS, BORDERLINE (ICD-790.29) - started meds 3/09 w/ METFORMIN 500- now on 1Bid. ~  labs 4/07 showed FBS=134, HgA1c=6.0.Marland KitchenMarland Kitchen ~  labs 3/09 showed BS= 140, HgA1c= 7.0.Marland KitchenMarland Kitchen Metformin started ~  labs 8/09 on Metform500Bid showed BS= 147, HgA1c= 6.5 ~  labs 1/10 on Metform500Bid showed BS= 144, A1c= 6.4.Marland Kitchen. rec> keep same. ~  labs 7/10 on Metform500Bid showed BS= 126, A1c= 6.2 ~  labs 11/11 on Metform500Bid showed BS= 108, A1c= 6.3 ~  Labs 4/13 on Metform500Bid showed BS= 107, A1c= 6.3 ~  Labs 10/13 on Metform500Bid showed BS= 110, A1c= not done. ~  Labs 4/14 on Metform500Bid showed BS= 111, A1c= 6.1  OVERWEIGHT (ICD-278.02) - started at 212# Mar09... ~  8/09:  great job w/ diet and exercise program and weight down 14# to 198# ~  1/10:  unfortunately weight back up to 208# after the holidays. ~  7/10: weight down to 198# ~  11/11: weight = 202# ~  4/13: Weight = 199# ~  10/13: Weight = 197# ~  4/14: Weight = 193#  Hx of GERD (ICD-530.81) - hx gastritis on EGD by [facility] 8/04... he takes NEXIUM 40mg Bid & denies dysphagia, abd pain, GERD, n/ v/ etc... ~  Abd Sonar 7/10  showed norm GB, norm ducts, incr liver echotexture c/w fatty liver dis, otherw norm- NAD...  DIVERTICULOSIS OF COLON (ICD-562.10) - colonoscopy 9/06 by Dorris Singh showed ischemic colitis- he needs a full diagnostic colonoscopy 62 y/o Bro had colon cancer... ~  f/u colonoscopy 9/09 by DrPatterson showed divertics, polyp= polypoid mucosa w/o adenomatous change... f/u planned 15yrs.  Hx of ISCHEMIC COLITIS (ICD-557.9) - hospitalized 9/06 w/ lower GIB secondary to ischemic colitis (required 2u transfusion)...   DEGENERATIVE JOINT DISEASE (ICD-715.90) & ?CTS w/ wrist pain> wrist splint Rx Qhs helps...  ONYCHOMYCOSIS (ICD-110.1) - Mar09 notes nail fungus on left hand and both feet... LAMISIL 250mg /d started... hands resolved now and toes are improved.   Past Surgical History  Procedure Laterality Date  . Angioplasty / stenting femoral  09/09/2002    Outpatient Encounter Prescriptions as of 06/26/2012  Medication Sig Dispense Refill  . aspirin 325 MG EC tablet Take 325 mg by mouth daily.        Marland Kitchen atorvastatin (LIPITOR) 80 MG tablet Take 1 tablet (80 mg total) by mouth daily.  90 tablet  3  . ezetimibe (ZETIA) 10 MG tablet Take 1 tablet (10 mg total) by mouth daily.  30 tablet  11  . metFORMIN (GLUCOPHAGE) 500 MG tablet TAKE ONE TABLET BY MOUTH TWICE DAILY WITH MEALS  180 tablet  2  . metoprolol (LOPRESSOR) 50 MG tablet TAKE ONE TABLET BY MOUTH TWICE DAILY  60 tablet  4  . Multiple Vitamin (MULTIVITAMIN) tablet Take 1 tablet by mouth daily.        . nitroGLYCERIN (NITROSTAT) 0.4 MG SL tablet Place 0.4 mg under the tongue every 5 (five) minutes as needed.        Marland Kitchen  omeprazole (PRILOSEC OTC) 20 MG tablet Take 20 mg by mouth daily.      . ONE TOUCH ULTRA TEST test strip       . [DISCONTINUED] esomeprazole (NEXIUM) 40 MG capsule Take 1 capsule (40 mg total) by mouth 2 (two) times daily.  180 capsule  3  . [DISCONTINUED] RABEprazole (ACIPHEX) 20 MG tablet Take 1 tablet (20 mg total) by mouth 2 (two) times  daily.  180 tablet  2   No facility-administered encounter medications on file as of 06/26/2012.    No Known Allergies   Current Medications, Allergies, Past Medical History, Past Surgical History, Family History, and Social History were reviewed in [employer] record.   Review of Systems    The patient denies fever, chills, sweats, anorexia, fatigue, weakness, malaise, weight loss, sleep disorder, blurring, diplopia, eye irritation, eye discharge, vision loss, eye pain, photophobia, earache, ear discharge, tinnitus, decreased hearing, nasal congestion, nosebleeds, sore throat, hoarseness, chest pain, palpitations, syncope, dyspnea on exertion, orthopnea, PND, peripheral edema, cough, dyspnea at rest, excessive sputum, hemoptysis, wheezing, pleurisy, nausea, vomiting, diarrhea, constipation, change in bowel habits, abdominal pain, melena, hematochezia, jaundice, gas/bloating, indigestion/heartburn, dysphagia, odynophagia, dysuria, hematuria, urinary frequency, urinary hesitancy, nocturia, incontinence, back pain, joint pain, joint swelling, muscle cramps, muscle weakness, stiffness, arthritis, sciatica, restless legs, leg pain at night, leg pain with exertion, rash, itching, dryness, suspicious lesions, paralysis, paresthesias, seizures, tremors, vertigo, transient blindness, frequent falls, frequent headaches, difficulty walking, depression, anxiety, memory loss, confusion, cold intolerance, heat intolerance, polydipsia, polyphagia, polyuria, unusual weight change, abnormal bruising, bleeding, enlarged lymph nodes, urticaria, allergic rash, hay fever, and recurrent infections.     Objective:   Physical Exam     WD, Overweight, 62 y/o WM in NAD...  GENERAL:  Alert & oriented; pleasant & cooperative... HEENT:  /AT, EOM-wnl, PERRLA, EACs-clear, TMs-wnl, NOSE-clear, THROAT-clear & wnl. NECK:  Supple w/ fairROM; no JVD; normal carotid impulses w/o bruits; no thyromegaly or  nodules palpated; no lymphadenopathy. CHEST:  Clear to P & A; without wheezes/ rales/ or rhonchi. HEART:  Regular Rhythm;  gr 1/6 SEM without rubs or gallops detected... ABDOMEN:  Soft & min tender in RUQ; normal bowel sounds; no organomegaly or masses palpated... EXT: without deformities, mild arthritic changes; no varicose veins/ venous insuffic/ or edema. NEURO:  CN's intact;  no focal neuro deficits... DERM:  onychomycosis improved as noted...  RADIOLOGY DATA:  Reviewed in the EPIC EMR & discussed w/ the patient...  LABORATORY DATA:  Reviewed in the EPIC EMR & discussed w/ the patient...   Assessment:      HBP> on Metop50Bid; well controlled- continue same med + low sodium etc...  CAD> on ASA325; he is too sedentary but denies angina, etc; needs to incr exercise program & f/u w/ Cards for stress testing......  Hyperlipidemia> on Lip80; FLP shows LDL=173 & goal w/ his CAD/ stent <70 therefore we tried to add ZETIA10, but he never refilled it; offered lipid clinic...  DM> on Metform500Bid; BS=111, A1c= 6.1; we reviewed diet, exercise & wt reduction strategies...  Overweight> wt= 193# & he knows the importance of wt reduction...  GI> GERD, Divertics, Hx ischemic colitis> on Omep20 for reflux esoph; continue same.  DJD> he uses OTC analgesics as needed...     Plan:      Patient's Medications  New Prescriptions   TAMSULOSIN (FLOMAX) 0.4 MG CAPS    Take 1 capsule (0.4 mg total) by mouth daily after supper.  Previous Medications  ASPIRIN 325 MG EC TABLET    Take 325 mg by mouth daily.     ATORVASTATIN (LIPITOR) 80 MG TABLET    Take 1 tablet (80 mg total) by mouth daily.   EZETIMIBE (ZETIA) 10 MG TABLET    Take 1 tablet (10 mg total) by mouth daily.   METFORMIN (GLUCOPHAGE) 500 MG TABLET    TAKE ONE TABLET BY MOUTH TWICE DAILY WITH MEALS   METOPROLOL (LOPRESSOR) 50 MG TABLET    TAKE ONE TABLET BY MOUTH TWICE DAILY   MULTIPLE VITAMIN (MULTIVITAMIN) TABLET    Take 1 tablet by  mouth daily.     NITROGLYCERIN (NITROSTAT) 0.4 MG SL TABLET    Place 0.4 mg under the tongue every 5 (five) minutes as needed.     OMEPRAZOLE (PRILOSEC OTC) 20 MG TABLET    Take 20 mg by mouth daily.   ONE TOUCH ULTRA TEST TEST STRIP      Modified Medications   No medications on file  Discontinued Medications   ESOMEPRAZOLE (NEXIUM) 40 MG CAPSULE    Take 1 capsule (40 mg total) by mouth 2 (two) times daily.   RABEPRAZOLE (ACIPHEX) 20 MG TABLET    Take 1 tablet (20 mg total) by mouth 2 (two) times daily.

## 2012-06-26 NOTE — Patient Instructions (Addendum)
Today we updated your med list in our EPIC system...    Continue your current medications the same...  We wrote a new prescription for Flomax (Tamsulosin) 0.4mg  - take one tab daily in the eve...  Today we did your follow uo FASTING blood work...    We will contact you w/ the results when available...   Keep up the good wiork w/ diet 7 exercise...  Call for any questions...  Let's plan a follow up visit in 43mo, sooner if needed for problems.Marland KitchenMarland Kitchen

## 2012-08-17 ENCOUNTER — Other Ambulatory Visit: Payer: Self-pay | Admitting: Pulmonary Disease

## 2012-08-26 ENCOUNTER — Ambulatory Visit (INDEPENDENT_AMBULATORY_CARE_PROVIDER_SITE_OTHER): Payer: BC Managed Care – PPO | Admitting: Cardiology

## 2012-08-26 ENCOUNTER — Encounter: Payer: Self-pay | Admitting: Cardiology

## 2012-08-26 VITALS — BP 138/84 | HR 74 | Ht 66.0 in | Wt 193.8 lb

## 2012-08-26 DIAGNOSIS — R011 Cardiac murmur, unspecified: Secondary | ICD-10-CM

## 2012-08-26 DIAGNOSIS — E785 Hyperlipidemia, unspecified: Secondary | ICD-10-CM

## 2012-08-26 DIAGNOSIS — I2510 Atherosclerotic heart disease of native coronary artery without angina pectoris: Secondary | ICD-10-CM

## 2012-08-26 DIAGNOSIS — I10 Essential (primary) hypertension: Secondary | ICD-10-CM

## 2012-08-26 NOTE — Assessment & Plan Note (Signed)
The patient is stable. He's not having any significant symptoms. I feel that he does not need an exercise test at this time. 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

## 2012-08-26 NOTE — Progress Notes (Signed)
HPI  Patient is seen today to followup coronary disease. I saw him last April, 2012. He does have coronary disease. He had an intervention in 2004. Catheterization in 2008 revealed only slight narrowing at the stent site. He is not having any symptoms. 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  No Known Allergies  Current Outpatient Prescriptions  Medication Sig Dispense Refill  . aspirin 325 MG EC tablet Take 325 mg by mouth daily.        Marland Kitchen atorvastatin (LIPITOR) 80 MG tablet Take 1 tablet (80 mg total) by mouth daily.  90 tablet  3  . metFORMIN (GLUCOPHAGE) 500 MG tablet TAKE ONE TABLET BY MOUTH TWICE DAILY WITH MEALS  180 tablet  2  . metoprolol (LOPRESSOR) 50 MG tablet TAKE ONE TABLET BY MOUTH TWICE DAILY  60 tablet  6  . nitroGLYCERIN (NITROSTAT) 0.4 MG SL tablet Place 0.4 mg under the tongue every 5 (five) minutes as needed.        . ONE TOUCH ULTRA TEST test strip        No current facility-administered medications for this visit.    History   Social History  . Marital Status: Married    Spouse Name: N/A    Number of Children: N/A  . 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 Smoking status: Former Smoker -- 1.00 packs/day for 26 years    Types: Cigarettes    Quit date: 03/05/1990  . Smokeless tobacco: Not on file  . Alcohol Use: Yes     Comment: 2 drinks per day  . Drug Use: Not on file  . Sexually Active: Not on file   Other Topics Concern  . Not on file   Social History Narrative  . No narrative on file    Family History  Problem Relation Age of Onset  . Heart failure Mother   . Diabetes Father     father also had ASHD/ CABG  . Colon cancer Brother     Brother also had AAA repair    Past Medical History  Diagnosis Date  . Hypertension   . CAD (coronary artery disease)     DES.. 2004 /  catheterization 2008, 10% narrowing stent site, pain not cardiac    . Dyslipidemia   . Murmur   . Diabetes mellitus   . Overweight(278.02)   . GERD (gastroesophageal reflux disease)   . Diverticulosis   . Colitis, ischemic   . Degenerative joint disease   . Onychomycosis   . Palpitations     Rapid heartbeat and shortness of breath after eating 2008 recurrent episode 2012  . Ejection fraction     65-70%, echo, March, 2012  . Mitral regurgitation     Echo, mild, 2012    Past Surgical History  Procedure Laterality Date  . Angioplasty / stenting femoral  09/09/2002    Patient Active Problem List   Diagnosis Date Noted  . Ejection fraction   . Hypertension   . CAD (coronary artery disease)   . Dyslipidemia   . Diabetes mellitus   . GERD (gastroesophageal reflux disease)   . Diverticulosis   . Colitis, ischemic   . Onychomycosis   . Palpitations   . Mitral regurgitation   . SYSTOLIC MURMUR 05/17/2010  . OVERWEIGHT 06/01/2007  . HYPERLIPIDEMIA 04/14/2007  . BRONCHITIS, ACUTE 04/14/2007  . DEGENERATIVE  JOINT DISEASE 04/14/2007    ROS   Patient denies fever, chills, headache, sweats, rash, change in vision, change in hearing, chest pain, cough, nausea vomiting, urinary symptoms. All other systems are reviewed and are negative.  PHYSICAL EXAM  Patient is overweight but this has not changed. He is oriented to person time and place. Affect is normal. There is no jugulovenous distention. Lungs are clear. Respiratory effort is nonlabored. Cardiac exam reveals S1 and S2. There is a 2/6 systolic murmur. The abdomen is soft. There is no peripheral edema.  Filed Vitals:   08/26/12 1622  BP: 138/84  Pulse: 74  Height: 5\' 6"  (1.676 m)  Weight: 193 lb 12.8 oz (87.907 kg)   EKG is done today and reviewed by me. He has old increased voltage and ST changes consistent with LVH. There is no change.  ASSESSMENT & PLAN

## 2012-08-26 NOTE — Assessment & Plan Note (Signed)
He has not need a followup echo at this time. No change in therapy.

## 2012-08-26 NOTE — Assessment & Plan Note (Signed)
He is on appropriate medications. No change in therapy. 

## 2012-08-26 NOTE — Patient Instructions (Addendum)
Your physician recommends that you continue on your current medications as directed. Please refer to the Current Medication list given to you today.  Your physician wants you to follow-up in: 1 year. You will receive a reminder letter in the mail two months in advance. If you don't receive a letter, please call our office to schedule the follow-up appointment.  

## 2012-10-21 ENCOUNTER — Other Ambulatory Visit: Payer: Self-pay | Admitting: Pulmonary Disease

## 2012-10-21 MED ORDER — METFORMIN HCL 500 MG PO TABS: ORAL_TABLET | ORAL | Status: DC

## 2012-12-29 ENCOUNTER — Other Ambulatory Visit (INDEPENDENT_AMBULATORY_CARE_PROVIDER_SITE_OTHER): Payer: BC Managed Care – PPO

## 2012-12-29 ENCOUNTER — Encounter: Payer: Self-pay | Admitting: Pulmonary Disease

## 2012-12-29 ENCOUNTER — Ambulatory Visit (INDEPENDENT_AMBULATORY_CARE_PROVIDER_SITE_OTHER): Payer: BC Managed Care – PPO | Admitting: Pulmonary Disease

## 2012-12-29 VITALS — BP 132/70 | HR 65 | Temp 97.6°F | Ht 66.0 in | Wt 192.0 lb

## 2012-12-29 DIAGNOSIS — K219 Gastro-esophageal reflux disease without esophagitis: Secondary | ICD-10-CM

## 2012-12-29 DIAGNOSIS — I059 Rheumatic mitral valve disease, unspecified: Secondary | ICD-10-CM

## 2012-12-29 DIAGNOSIS — Z23 Encounter for immunization: Secondary | ICD-10-CM

## 2012-12-29 DIAGNOSIS — K5730 Diverticulosis of large intestine without perforation or abscess without bleeding: Secondary | ICD-10-CM

## 2012-12-29 DIAGNOSIS — E119 Type 2 diabetes mellitus without complications: Secondary | ICD-10-CM

## 2012-12-29 DIAGNOSIS — E663 Overweight: Secondary | ICD-10-CM

## 2012-12-29 DIAGNOSIS — E785 Hyperlipidemia, unspecified: Secondary | ICD-10-CM

## 2012-12-29 DIAGNOSIS — I10 Essential (primary) hypertension: Secondary | ICD-10-CM

## 2012-12-29 DIAGNOSIS — N400 Enlarged prostate without lower urinary tract symptoms: Secondary | ICD-10-CM

## 2012-12-29 DIAGNOSIS — I2510 Atherosclerotic heart disease of native coronary artery without angina pectoris: Secondary | ICD-10-CM

## 2012-12-29 DIAGNOSIS — M1990 Unspecified osteoarthritis, unspecified site: Secondary | ICD-10-CM

## 2012-12-29 DIAGNOSIS — I340 Nonrheumatic mitral (valve) insufficiency: Secondary | ICD-10-CM

## 2012-12-29 DIAGNOSIS — K5790 Diverticulosis of intestine, part unspecified, without perforation or abscess without bleeding: Secondary | ICD-10-CM

## 2012-12-29 DIAGNOSIS — N401 Enlarged prostate with lower urinary tract symptoms: Secondary | ICD-10-CM | POA: Insufficient documentation

## 2012-12-29 LAB — HEPATIC FUNCTION PANEL
AST: 23 U/L (ref 0–37)
Albumin: 4.2 g/dL (ref 3.5–5.2)
Total Bilirubin: 0.4 mg/dL (ref 0.3–1.2)

## 2012-12-29 LAB — HEMOGLOBIN A1C: Hgb A1c MFr Bld: 6.4 % (ref 4.6–6.5)

## 2012-12-29 LAB — BASIC METABOLIC PANEL
BUN: 15 mg/dL (ref 6–23)
Calcium: 9.5 mg/dL (ref 8.4–10.5)
Chloride: 101 mEq/L (ref 96–112)
Creatinine, Ser: 0.7 mg/dL (ref 0.4–1.5)
GFR: 124.74 mL/min (ref >60.00)
Glucose, Bld: 115 mg/dL — ABNORMAL HIGH (ref 70–99)
Potassium: 4.9 mEq/L (ref 3.5–5.1)

## 2012-12-29 LAB — LIPID PANEL
HDL: 48.4 mg/dL (ref >39.00)
Triglycerides: 115 mg/dL (ref 0.0–149.0)
VLDL: 23 mg/dL (ref 0.0–40.0)

## 2012-12-29 LAB — LDL CHOLESTEROL, DIRECT: Direct LDL: 195.5 mg/dL

## 2012-12-29 NOTE — Patient Instructions (Signed)
Today we updated your med list in our EPIC system...    Continue your current medications the same...  Today we did your follow up lipid panel 7 DM labs...    We will contact you w/ the results when available...   Let's get on track w/ our diet & exercise program...    The goal is to lose 10-15 lbs...  Call for any questions...  Let's plan a follow up visit in 61mo, sooner if needed for problems.Marland KitchenMarland Kitchen

## 2012-12-29 NOTE — Progress Notes (Signed)
Subjective:     Patient ID: Kurt Kidd, male   DOB: 11-12-1950, 62 y.o.   MRN: [id number]  HPI 62 y/o WM here for a follow up visit... he has multiple medical problems as noted below...    ~  June 26, 2011:  31mo ROV & Kurt Kidd has noted incr BS at home> Kidd prob list below >>     HBP> on Metop50Bid; BP= 148/80 & he denies CP, palpit, dizzy, SOB, edema, etc.     CAD> on ASA325; he is too sedentary but denies angina, etc; had f/u w/ Kurt Kidd 4/12> 2DEcho showed Ao sclerosis, no stenosis, mild MR, norm LVF; he noted rapid HR after eating which improved by adjusting his eating habits; decr alcohol & caffeine...     Hyperlipidemia> on Lip40; FLP shows TChol 230, TG 116, HDL 50, LDL 165 & he is rec to incr to Lip80 & take it every day!     DM> on Metform500Bid; BS=107, A1c=6.3; we reviewed diet, exercise & wt reduction strategies...     Overweight> wt= 199# & he knows the importance of wt reduction...     GI> GERD, Divertics, Hx ischemic colitis> on Aciphex40Bid from Kurt Kidd for reflux espoh; he denies abd pain, n/v, d/c, blood seen..     DJD> he uses OTC analgesics as needed...  We reviewed prob list, meds, xrays and labs> Kidd below for updates >>   CXR 4/13 showed heart size at upper lim norm, clear lungs, NAD...   LABS 4/13: FLP- not at goals on Lip40 w/ LDL=165; Chems- ok x BS=107 A1c=6.3; CBC- wnl; TSH=1.59; PSA=2.43   ~  December 26, 2011:  55mo ROV & Kurt Kidd wants to know which of his meds reacts w/ beer (usually drinks 1-2 on weekends, but had 6-7 one day at the beach & became jittery, nervous, shaking- last 2H & resolved spont, BS was 142); I told him to just stop drinking that much beer...    HBP> on Metop50Bid; BP= 130/72 & he denies CP, palpit, dizzy, SOB, edema, etc.    CAD> on [id number]; he is too sedentary but denies angina, etc; had f/u w/ Kurt Kidd 4/12> 2DEcho showed Ao sclerosis, no stenosis, mild MR, norm LVF; he noted rapid    HR after eating which improved by adjusting his eating  habits; decr alcohol & caffeine...    Hyperlipidemia> on Lip80 now ?compliance; FLP shows TChol 220, TG 138, HDL 42, LDL 163; needs to take Lip80 DAILY & needs ZETIA10 added.    DM> on Metform500Bid; BS=110, A1c= wasn't run; we reviewed diet, exercise & wt reduction strategies...    Overweight> wt= 197# & he knows the importance of wt reduction...    GI> GERD, Divertics, Hx ischemic colitis> on Nexium40Bid from Kurt Kidd for reflux espoh; he denies abd pain, n/v, d/c, blood seen..    DJD> he uses OTC analgesics as needed... We reviewed prob list, meds, xrays and labs> Kidd below for updates >> OK Flu shot today...  LABS 10/13:  FLP- not at goals on Lip80 w/ LDL=163;  Chems- ok...  ~  June 26, 2012:  55mo ROV & Kurt Kidd says he feels bad AFTER he eats & we discussed low carb/ low fat diet, smaller portions, etc;  Also c/o weak stream, dribbles, denies incont, bro-in-law dx w/ prostate ca- we discussed trial Flomax & check PSA=2.79;  He also had f/u colonoscopy by DrByrnett in [place] 2/14= wnl, neg, no lesions seen...     HBP> on Metop50Bid; BP= 124/82 &  he denies CP, palpit, dizzy, SOB, edema, etc.    CAD> on ASA325; he is too sedentary but denies angina, etc; had f/u w/ Kurt Kidd 4/12> 2DEcho showed Ao sclerosis, no stenosis, mild MR, norm LVF; he noted rapid HR after eating which improved by adjusting his eating habits, decr alcohol & caffeine...    Hyperlipidemia> on Lip80 ?compliance (NOT taking Zetia); FLP shows TChol 227, TG 136, HDL 43, LDL 173; needs to take Lip80 DAILY & needs ZETIA10 added but he declines due to $$...    DM> on Metform500Bid; BS=111, A1c= 6.1; we reviewed diet, exercise & wt reduction strategies...    Overweight> wt= 193# which is down 4#- keep up the good work...    GI- GERD, Divertics, Hx ischemic colitis> prev on NexiumBid from Kurt Kidd for reflux esoph but he stopped on his own & taking Omep20 instead; he denies abd pain, n/v, d/c, blood seen; his Bro had colon cancer- f/u  colonoscopy in [facility] 2/14 was neg- wnl & f/u planned 5yrs...    GU- BPH> not currently on meds; c/o weak stream, dribbles, denies incont, bro-in-law dx w/ prostate ca- we discussed trial Flomax0.4/d & check PSA=2.79...    DJD> he uses OTC analgesics as needed... We reviewed prob list, meds, xrays and labs> Kidd below for updates >>   LABS 4/14:  FLP- not at goals on Lip80;  Chems- ok w/ BS=111, A1c=6.1;  CBC- wnl;  TSH=1.82;  PSA=2.79;  UA- clear...   ~  December 29, 2012:  54mo ROV & Kurt Kidd describes an episode where he was at the beach drinking w/ his brother then got SOB w/ palpit when supine at night- vague, poor hx, self limited, ?what to make of this; he notes that his sugars have been "up & down" &he's wondering about Kurt Kidd (has a friend on this)... We reviewed the following medical problems during today's office visit >>     HBP> on Metop50Bid; BP= 132/70 & he denies recurrent CP, palpit, dizzy, SOB, edema, etc.    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    Hyperlipidemia> on Lip80 ?compliance (NOT taking Zetia); FLP 10/14 showed TChol 252, TG 115, HDL 48, LDL 196; needs to take Lip80 DAILY & needs ZETIA10 added but he declines due to $$...    DM> on Metform500Bid; labs 10/14 showed BS=115, A1c= 6.4; we reviewed diet, exercise & wt reduction strategies...    Overweight> wt= 192# which is essentially unchanged- we reviewed diet, exercise, wt reduction strategies...    GI- GERD, Divertics, Hx ischemic colitis> prev on NexiumBid from Kurt Kidd for reflux esoph but he stopped on his own &  taking Omep20 OTC instead; he denies abd pain, n/v, d/c, blood seen; his Bro had colon cancer- f/u colonoscopy in [facility] 2/14 was neg- wnl & f/u planned 47yrs (DrByrnett)...    GU- BPH> not currently on meds; he was prev on Flomax for LTOS but off now & denies urinary symptoms; bro-in-law dx w/ prostate ca- pt's PSA 4/14 = 2.79...    DJD> he uses OTC analgesics as needed.Marland KitchenMarland Kitchen  We reviewed prob list, meds, xrays and labs> Kidd below for updates >> given 2014 Flu vaccine today...  LABS 10/14:  FLP- not at goals & Pharm confirms non-compliance w/ Lip80;  Chems- wnl w/ BS=115, A1c=6.4.Marland KitchenMarland Kitchen          Problem List:    BRONCHITIS, ACUTE (ICD-466.0) - he is an ex-smoker, quit ~62yrs... no recent problems and the exercise program has really helped... ~  CXR 4/13 showed heart at upper lim normal, clear lungs, NAD...  HYPERTENSION (ICD-401.9) - on METOPROLOL 50mg Bid... 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 ~  4/13:  BP= 148/80 & he remains largely asymptomatic; CXR 4/13 showed heart size at upper lim norm, clear lungs, NAD...  ~  10/13:  BP= 130/72 & he continues to deny CP, palpt, SOB, edema, etc... ~  4/14:  on Metop50Bid; BP= 124/82 & he denies CP, palpit, dizzy, SOB, edema, etc... ~  10/14: on Metop50Bid; BP= 132/70 & he denies recurrent CP, palpit, dizzy, SOB, edema, etc.  CORONARY ARTERY DISEASE (ICD-414.00) - on ASA 325mg /d... followed by Kurt Kidd & his notes are reviewed... he has done better recently w/ secondary risk factor reduction... ~  NuclearStressTest 7/04 showed ?anteroseptal scar vs thinning, no ischemia, EF=61%... ~  initial cath 7/04 w/ 80% LAD lesion... stent placed by DrStuckey... ~  last cath 8/08 showed non-obstructive CAD & prev stent site w/ <10% narrowing, EF=65%... ~  he continues asymptomatic w/o CP, palpit, SOB, edema, etc =>  still w/ mult risk factors & hi risk pt therefore rec f/u w/ Cards for f/u screening... ~  He saw Kurt Kidd 4/12> 2DEcho showed Ao sclerosis, no stenosis, mild MR, norm LVF;  He noted rapid HR after eating which improved by adjusting his eating habits, decr alcohol & caffeine...  ~  He had f/u w/ DrKatz 6/14> CAD w/ intervention in 2004; cath in 2008 showed mild stenosis at the stent site;  He noted unusual palpit & SOB after eating in 2012- further eval w/ 2DEcho 4/12 showed Ao sclerosis, no stenosis, mild MR, norm LVF; Event monitor was done (?) but no report avail;  Symptoms improved by adjusting his eating habits, decr alcohol & caffeine... ~  EKG 6/14 showed NSR, rate73, LVH w/ repol abn...  HYPERLIPIDEMIA (ICD-272.4) - on LIPITOR 80mg /d now... he stopped his prev Fish Oil supplements ("they gave me gas")...  ~  FLP 4/07 on diet showed TChol 271, TG 443, HDL 31, LDL 155... Rec~ restart ZOCOR 80... ~  FLP 3/09 on Simva80 showed TChol 181, TG 166, HDL 36, LDL 112... same med & get wt down (then lost 14#) ~  FLP 8/09 on Simva80 showed TChol 186, TG 254, HDL 37, LDL 127... rec- same med, better diet, may need Fibrate etc. ~  FLP 1/10 on Simva80 showed TChol 190, TG 118, HDL 39, LDL 127... rec> get wt down! ~  FLP 7/10 on Simva80 showed TChol 179, TG 121, HDL 48, LDL 107 ~  11/11: not fasting for labs today & we decided to switch to LIPITOR 40mg /d. ~  FLP 4/13 ?on Lip40 showed TChol 230, TG 116, HDL 50, LDL 165... rec to take daily & incr to Lip80. ~  FLP 10/13 on Lip80 showed TChol 220, TG 138, HDL , LDL 163...  Known CAD w/ stent, Add ZETIA10 => he never refilled this Rx due to $$... ~  FLP 4/14 on Lip80 showed TChol 227, TG 136, HDL 43, LDL 173... Reminded to take med regularly... ~  FLP 10/14 on Lip80 showed TChol 252, TG 115, HDL 48, LDL 196... Pharm confirms med noncompliance even though pt insists he's taking it regularly w/ these bad numbers...  DIABETES MELLITUS, BORDERLINE (ICD-790.29) -  started meds 3/09 w/ METFORMIN 500- now on 1Bid. ~  labs 4/07 showed FBS=134, HgA1c=6.0.Marland KitchenMarland Kitchen ~  labs 3/09 showed BS= 140, HgA1c= 7.0.Marland KitchenMarland Kitchen Metformin started ~  labs 8/09 on Metform500Bid showed BS= 147, HgA1c= 6.5 ~  labs 1/10 on Metform500Bid showed BS= 144, A1c= 6.4.Marland Kitchen. rec> keep same. ~  labs 7/10 on Metform500Bid showed BS= 126, A1c= 6.2 ~  labs 11/11 on Metform500Bid showed BS= 108, A1c= 6.3 ~  Labs 4/13 on Metform500Bid showed BS= 107, A1c= 6.3 ~  Labs 10/13 on Metform500Bid showed BS= 110, A1c= not done. ~  Labs 4/14 on Metform500Bid showed BS= 111, A1c= 6.1 ~  Labs 10/14 on Metform500Bid showed BS=115, A1c=6.4  OVERWEIGHT (ICD-278.02) - started at 212# Mar09... ~  8/09:  great job w/ diet and exercise program and weight down 14# to 198# ~  1/10:  unfortunately weight back up to 208# after the holidays. ~  7/10: weight down to 198# ~  11/11: weight = 202# ~  4/13: Weight = 199# ~  10/13: Weight = 197# ~  4/14: Weight = 193# ~  10/14:  Weight = 192#  Hx of GERD (ICD-530.81) - hx gastritis on EGD by [facility] 8/04... he takes NEXIUM 40mg Bid & denies dysphagia, abd pain, GERD, n/ v/ etc... ~  Abd Sonar 7/10 showed norm GB, norm ducts, incr liver echotexture c/w fatty liver dis, otherw norm- NAD...  DIVERTICULOSIS OF COLON (ICD-562.10) - colonoscopy 9/06 by Dorris Singh showed ischemic colitis- he needs a full diagnostic colonoscopy 62 y/o Bro had colon cancer... ~  f/u colonoscopy 9/09 by DrPatterson showed divertics, polyp= polypoid mucosa w/o adenomatous change... f/u planned 65yrs. ~  f/u colonoscopy in [facility] 2/14 was neg- wnl & f/u planned 58yrs (DrByrnett)...  Hx of ISCHEMIC COLITIS (ICD-557.9) - hospitalized 9/06 w/ lower GIB secondary to ischemic colitis (required 2u transfusion)...   DEGENERATIVE JOINT DISEASE (ICD-715.90) & ?CTS w/ wrist pain> wrist splint Rx Qhs helps...  ONYCHOMYCOSIS (ICD-110.1) - Mar09 notes nail fungus on left hand and both feet... LAMISIL 250mg /d  started... hands resolved now and toes are improved.   Past Surgical History  Procedure Laterality Date  . Angioplasty / stenting femoral  09/09/2002    Outpatient Encounter Prescriptions as of 12/29/2012  Medication Sig Dispense Refill  . aspirin 325 MG EC tablet Take 325 mg by mouth daily.        Marland Kitchen atorvastatin (LIPITOR) 80 MG tablet Take 1 tablet (80 mg total) by mouth daily.  90 tablet  3  . metFORMIN (GLUCOPHAGE) 500 MG tablet TAKE ONE TABLET BY MOUTH TWICE DAILY WITH MEALS  180 tablet  2  . metoprolol (LOPRESSOR) 50 MG tablet TAKE ONE TABLET BY MOUTH TWICE DAILY  60 tablet  6  . nitroGLYCERIN (NITROSTAT) 0.4 MG SL tablet Place 0.4 mg under the tongue every 5 (five) minutes as needed.        . ONE TOUCH ULTRA TEST test strip        No facility-administered encounter medications on file as of 12/29/2012.    No Known Allergies   Current Medications, Allergies, Past Medical History, Past Surgical History, Family History, and Social History were reviewed in [facility] electronic medical record.   Review of Systems    The patient denies fever, chills, sweats, anorexia, fatigue, weakness, malaise, weight loss, sleep disorder, blurring, diplopia, eye irritation, eye discharge, vision loss, eye pain, photophobia, earache, ear discharge, tinnitus, decreased hearing, nasal congestion, nosebleeds, sore throat, hoarseness, chest pain, palpitations, syncope, dyspnea on exertion, orthopnea, PND, peripheral edema, cough, dyspnea at rest, excessive sputum, hemoptysis, wheezing, pleurisy, nausea, vomiting, diarrhea, constipation, change in bowel habits, abdominal pain, melena, hematochezia, jaundice, gas/bloating, indigestion/heartburn, dysphagia, odynophagia, dysuria, hematuria, urinary frequency, urinary hesitancy, nocturia, incontinence, back pain, joint pain, joint swelling, muscle cramps, muscle weakness, stiffness, arthritis, sciatica, restless legs, leg pain at night, leg pain with  exertion, rash, itching, dryness, suspicious lesions, paralysis, paresthesias, seizures, tremors, vertigo, transient blindness, frequent falls, frequent headaches, difficulty walking, depression, anxiety, memory loss, confusion, cold intolerance, heat intolerance, polydipsia, polyphagia, polyuria, unusual weight change, abnormal bruising, bleeding, enlarged lymph nodes, urticaria, allergic rash, hay fever, and recurrent infections.     Objective:   Physical Exam     WD, Overweight, 62 y/o WM in NAD...  GENERAL:  Alert & oriented; pleasant & cooperative... HEENT:  [place]/AT, EOM-wnl, PERRLA, EACs-clear, TMs-wnl, NOSE-clear, THROAT-clear & wnl. NECK:  Supple w/ fairROM; no JVD; normal carotid impulses w/o bruits; no thyromegaly or nodules palpated; no lymphadenopathy. CHEST:  Clear to P & A; without wheezes/ rales/ or rhonchi. HEART:  Regular Rhythm;  gr 1/6 SEM without rubs or gallops detected... ABDOMEN:  Soft & min tender in RUQ; normal bowel sounds; no organomegaly or masses palpated... EXT: without deformities, mild arthritic changes; no varicose veins/ venous insuffic/ or edema. NEURO:  CN's intact;  no focal neuro deficits... DERM:  onychomycosis improved as noted...  RADIOLOGY DATA:  Reviewed in the EPIC EMR & discussed w/ the patient...  LABORATORY DATA:  Reviewed in the EPIC EMR & discussed w/ the patient...   Assessment:      HBP> on Metop50Bid; well controlled- continue same med + low sodium etc...  CAD> on ASA325; he is too sedentary but denies angina, etc; needs to incr exercise program & f/u w/ Cards for poss stress testing......  Hyperlipidemia> on Lip80; FLP shows LDL=196 & goal w/ his CAD/ stent <70 therefore we tried to add ZETIA10, but he never refilled it; offered lipid clinic but he declined; ? If taking med regularly & we discussed this...  DM> on Metform500Bid; BS=115, A1c= 6.4; we reviewed diet, exercise & wt reduction strategies...  Overweight> wt= 192# & he knows  the importance of wt reduction...  GI> GERD, Divertics, Hx ischemic colitis> on Omep20 for reflux esoph; continue same.  DJD> he uses OTC analgesics as needed...     Plan:

## 2013-04-07 ENCOUNTER — Other Ambulatory Visit: Payer: Self-pay | Admitting: Pulmonary Disease

## 2013-05-06 ENCOUNTER — Other Ambulatory Visit: Payer: Self-pay | Admitting: Pulmonary Disease

## 2013-06-22 ENCOUNTER — Other Ambulatory Visit: Payer: Self-pay | Admitting: Pulmonary Disease

## 2013-07-07 ENCOUNTER — Encounter: Payer: Self-pay | Admitting: Pulmonary Disease

## 2013-07-07 ENCOUNTER — Ambulatory Visit (INDEPENDENT_AMBULATORY_CARE_PROVIDER_SITE_OTHER): Payer: BC Managed Care – PPO | Admitting: Pulmonary Disease

## 2013-07-07 ENCOUNTER — Ambulatory Visit (INDEPENDENT_AMBULATORY_CARE_PROVIDER_SITE_OTHER)
Admission: RE | Admit: 2013-07-07 | Discharge: 2013-07-07 | Disposition: A | Discharge status: Home / Self Care | Payer: BC Managed Care – PPO | Source: Ambulatory Visit | Attending: Pulmonary Disease | Admitting: Pulmonary Disease

## 2013-07-07 ENCOUNTER — Other Ambulatory Visit (INDEPENDENT_AMBULATORY_CARE_PROVIDER_SITE_OTHER): Payer: BC Managed Care – PPO

## 2013-07-07 VITALS — BP 140/72 | HR 68 | Temp 97.9°F | Ht 66.0 in | Wt 189.0 lb

## 2013-07-07 DIAGNOSIS — E785 Hyperlipidemia, unspecified: Secondary | ICD-10-CM

## 2013-07-07 DIAGNOSIS — R259 Unspecified abnormal involuntary movements: Secondary | ICD-10-CM

## 2013-07-07 DIAGNOSIS — I10 Essential (primary) hypertension: Secondary | ICD-10-CM

## 2013-07-07 DIAGNOSIS — E119 Type 2 diabetes mellitus without complications: Secondary | ICD-10-CM

## 2013-07-07 DIAGNOSIS — F419 Anxiety disorder, unspecified: Secondary | ICD-10-CM

## 2013-07-07 DIAGNOSIS — I2510 Atherosclerotic heart disease of native coronary artery without angina pectoris: Secondary | ICD-10-CM

## 2013-07-07 DIAGNOSIS — I340 Nonrheumatic mitral (valve) insufficiency: Secondary | ICD-10-CM

## 2013-07-07 DIAGNOSIS — K5790 Diverticulosis of intestine, part unspecified, without perforation or abscess without bleeding: Secondary | ICD-10-CM

## 2013-07-07 DIAGNOSIS — I059 Rheumatic mitral valve disease, unspecified: Secondary | ICD-10-CM

## 2013-07-07 DIAGNOSIS — K5730 Diverticulosis of large intestine without perforation or abscess without bleeding: Secondary | ICD-10-CM

## 2013-07-07 DIAGNOSIS — K219 Gastro-esophageal reflux disease without esophagitis: Secondary | ICD-10-CM

## 2013-07-07 DIAGNOSIS — K559 Vascular disorder of intestine, unspecified: Secondary | ICD-10-CM

## 2013-07-07 DIAGNOSIS — E663 Overweight: Secondary | ICD-10-CM

## 2013-07-07 DIAGNOSIS — F4323 Adjustment disorder with mixed anxiety and depressed mood: Secondary | ICD-10-CM

## 2013-07-07 DIAGNOSIS — N320 Bladder-neck obstruction: Secondary | ICD-10-CM

## 2013-07-07 DIAGNOSIS — F411 Generalized anxiety disorder: Secondary | ICD-10-CM

## 2013-07-07 DIAGNOSIS — M1990 Unspecified osteoarthritis, unspecified site: Secondary | ICD-10-CM

## 2013-07-07 DIAGNOSIS — R251 Tremor, unspecified: Secondary | ICD-10-CM

## 2013-07-07 LAB — BASIC METABOLIC PANEL
BUN: 13 mg/dL (ref 6–23)
CHLORIDE: 102 meq/L (ref 96–112)
CO2: 29 mEq/L (ref 19–32)
Calcium: 9.7 mg/dL (ref 8.4–10.5)
Creatinine, Ser: 0.7 mg/dL (ref 0.4–1.5)
GFR: 118.48 mL/min (ref >60.00)
Glucose, Bld: 122 mg/dL — ABNORMAL HIGH (ref 70–99)
POTASSIUM: 5.2 meq/L — AB (ref 3.5–5.1)
Sodium: 139 mEq/L (ref 135–145)

## 2013-07-07 LAB — CBC WITH DIFFERENTIAL/PLATELET
BASOS PCT: 1 % (ref 0.0–3.0)
Basophils Absolute: 0.1 10*3/uL (ref 0.0–0.1)
EOS ABS: 0.1 10*3/uL (ref 0.0–0.7)
Eosinophils Relative: 2 % (ref 0.0–5.0)
HCT: 41.3 % (ref 39.0–52.0)
Hemoglobin: 14 g/dL (ref 13.0–17.0)
Lymphocytes Relative: 26.5 % (ref 12.0–46.0)
Lymphs Abs: 1.8 10*3/uL (ref 0.7–4.0)
MCHC: 33.9 g/dL (ref 30.0–36.0)
MCV: 85.7 fl (ref 78.0–100.0)
MONO ABS: 0.6 10*3/uL (ref 0.1–1.0)
Monocytes Relative: 9.4 % (ref 3.0–12.0)
NEUTROS PCT: 61.1 % (ref 43.0–77.0)
Neutro Abs: 4.2 10*3/uL (ref 1.4–7.7)
PLATELETS: 278 10*3/uL (ref 150.0–400.0)
RBC: 4.82 Mil/uL (ref 4.22–5.81)
RDW: 14.4 % (ref 11.5–15.5)
WBC: 6.8 10*3/uL (ref 4.0–10.5)

## 2013-07-07 LAB — LIPID PANEL
CHOL/HDL RATIO: 4
CHOLESTEROL: 207 mg/dL — AB (ref 0–200)
HDL: 48.4 mg/dL (ref >39.00)
LDL Cholesterol: 137 mg/dL — ABNORMAL HIGH (ref 0–99)
Triglycerides: 109 mg/dL (ref 0.0–149.0)
VLDL: 21.8 mg/dL (ref 0.0–40.0)

## 2013-07-07 LAB — PSA: PSA: 2.63 ng/mL (ref 0.10–4.00)

## 2013-07-07 LAB — HEPATIC FUNCTION PANEL
ALT: 30 U/L (ref 0–53)
AST: 26 U/L (ref 0–37)
Albumin: 4.3 g/dL (ref 3.5–5.2)
Alkaline Phosphatase: 70 U/L (ref 39–117)
BILIRUBIN DIRECT: 0.1 mg/dL (ref 0.0–0.3)
BILIRUBIN TOTAL: 0.8 mg/dL (ref 0.2–1.2)
Total Protein: 7.1 g/dL (ref 6.0–8.3)

## 2013-07-07 LAB — TSH: TSH: 1.84 u[IU]/mL (ref 0.35–4.50)

## 2013-07-07 LAB — HEMOGLOBIN A1C: Hgb A1c MFr Bld: 6.4 % (ref 4.6–6.5)

## 2013-07-07 MED ORDER — ALPRAZOLAM 0.5 MG PO TABS: ORAL_TABLET | ORAL | Status: DC

## 2013-07-07 MED ORDER — NITROGLYCERIN 0.4 MG SL SUBL: 0.4000 mg | SUBLINGUAL_TABLET | SUBLINGUAL | Status: DC | PRN

## 2013-07-07 NOTE — Patient Instructions (Signed)
Today we updated your med list in our EPIC system...    Continue your current medications the same...    We refilled your NTG to try during one of your "spells" to see if it has an effect...  We wrote a new prescription for ALPRAZOLAM 0.5mg - take 1/2 tab before meals and 1 tab at bedtime to help you rest as we discussed...  Today we did your follow up CXR & FASTING blood work...    We will contact you w/ the results when available...   We will try to get you into see drKatz, Cardiology ASAP...    Please also call your GI in [place] about an appt related to the after meal symptoms you are having to be sure there is not some GI cause...  Let's plan a follow up visit in 6weeks, sooner if needed for problems.Marland KitchenMarland Kitchen

## 2013-07-08 NOTE — Progress Notes (Signed)
Subjective:     Patient ID: Kurt Kidd, male   DOB: 1950-03-30, 63 y.o.   MRN: [id number]  HPI 63 y/o WM here for a follow up visit... he has multiple medical problems as noted below...    ~  December 26, 2011:  109mo ROV & Kurt Kidd wants to know which of his meds reacts w/ beer (usually drinks 1-2 on weekends, but had 6-7 one day at the beach & became jittery, nervous, shaking- last 2H & resolved spont, BS was 142); I told him to just stop drinking that much beer...    HBP> on Metop50Bid; BP= 130/72 & he denies CP, palpit, dizzy, SOB, edema, etc.    CAD> on VPX106; he is too sedentary but denies angina, etc; had f/u w/ Kurt Kidd 4/12> 2DEcho showed Ao sclerosis, no stenosis, mild MR, norm LVF; he noted rapid    HR after eating which improved by adjusting his eating habits; decr alcohol & caffeine...    Hyperlipidemia> on Lip80 now ?compliance; FLP shows TChol 220, TG 138, HDL 42, LDL 163; needs to take Lip80 DAILY & needs ZETIA10 added.    DM> on Metform500Bid; BS=110, A1c= wasn't run; we reviewed diet, exercise & wt reduction strategies...    Overweight> wt= 197# & he knows the importance of wt reduction...    GI> GERD, Divertics, Hx ischemic colitis> on Nexium40Bid from Kurt Kidd for reflux espoh; he denies abd pain, n/v, d/c, blood seen..    DJD> he uses OTC analgesics as needed... We reviewed prob list, meds, xrays and labs> see below for updates >> OK Flu shot today...  LABS 10/13:  FLP- not at goals on Lip80 w/ LDL=163;  Chems- ok...  ~  June 26, 2012:  32mo ROV & Kurt Kidd says he feels bad AFTER he eats & we discussed low carb/ low fat diet, smaller portions, etc;  Also c/o weak stream, dribbles, denies incont, Kurt Kidd dx w/ prostate ca- we discussed trial Flomax & check PSA=2.79;  He also had f/u colonoscopy by Kurt Kidd in [place] 2/14= wnl, neg, no lesions seen...     HBP> on Metop50Bid; BP= 124/82 & he denies CP, palpit, dizzy, SOB, edema, etc.    CAD> on Kurt Kidd; he is too sedentary but  denies angina, etc; had f/u w/ Kurt Kidd 4/12> 2DEcho showed incr wall thickness c/w severeLVH, norm LVF w/ EF=65-70%, mild MR, mild LAdil; he noted rapid HR after eating which improved by adjusting his eating habits, decr alcohol & caffeine...    Hyperlipidemia> on Lip80 ?compliance (NOT taking Zetia); FLP shows TChol 227, TG 136, HDL 43, LDL 173; needs to take Lip80 DAILY & needs ZETIA10 added but he declines due to $$...    DM> on Metform500Bid; BS=111, A1c= 6.1; we reviewed diet, exercise & wt reduction strategies...    Overweight> wt= 193# which is down 4#- keep up the good work...    GI- GERD, Divertics, Hx ischemic colitis> prev on NexiumBid from Kurt Kidd for reflux esoph but he stopped on his own & taking Omep20 instead; he denies abd pain, n/v, d/c, blood seen; his Kurt Kidd had colon cancer- f/u colonoscopy in [facility] 2/14 was neg- wnl & f/u planned 49yrs...    GU- BPH> not currently on meds; c/o weak stream, dribbles, denies incont, Kurt Kidd dx w/ prostate ca- we discussed trial Flomax0.4/d & check PSA=2.79...    DJD> he uses OTC analgesics as needed... We reviewed prob list, meds, xrays and labs> see below for updates >>   LABS 4/14:  FLP-  not at goals on Lip80;  Chems- ok w/ BS=111, A1c=6.1;  CBC- wnl;  TSH=1.82;  PSA=2.79;  UA- clear...   ~  December 29, 2012:  72moROV & Kurt Kidd describes an episode where he was at the beach drinking w/ his brother then got SOB w/ palpit when supine at night- vague, poor hx, self limited, ?what to make of this; he notes that his sugars have been "up & down" &he's wondering about IAnastasio Kidd(has a friend on this)... We reviewed the following medical problems during today's office visit >>     HBP> on Metop50Bid; BP= 132/70 & he denies recurrent CP, palpit, dizzy, SOB, edema, etc.    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    Hyperlipidemia> on Lip80 ?compliance (NOT taking Zetia); FLP 10/14 showed TChol 252, TG 115, HDL 48, LDL 196; needs to take Lip80 DAILY & needs ZETIA10 added but he declines due to $$...    DM> on Metform500Bid; labs 10/14 showed BS=115, A1c= 6.4; we reviewed diet, exercise & wt reduction strategies...    Overweight> wt= 192# which is essentially unchanged- we reviewed diet, exercise, wt reduction strategies...    GI- GERD, Divertics, Hx ischemic colitis> prev on NexiumBid from Kurt Kidd for reflux esoph but he stopped on his own & taking Omep20 OTC instead; he denies abd pain, n/v, d/c, blood seen; his [facility]- wnl & f/u planned 535yr(Kurt Kidd)...    GU- BPH> not currently on meds; he was prev on Flomax for LTOS but off now & denies urinary symptoms; Kurt Kidd dx w/ prostate ca- pt's PSA 4/14 = 2.79...    DJD> he uses OTC analgesics as needed... We reviewed prob list, meds, xrays and labs> see below for updates >> given 2014 Flu vaccine today...  LABS 10/14:  FLP- not at goals & Pharm confirms non-compliance w/ Lip80;  Chems- wnl w/ BS=115, A1c=6.4...  ~  Jul 07, 2013:  75m3moV & Kurt Kidd describes unusual symptom complex> about 1.5hrs after eating (every time he eats) he gets a discomfort in his left shoulder/ upper chest area, followed by increased heart rate/ palpit/ shakey and SOB; he's checked BS and they have been normal (no hypoglycemia) & it occurs after eating, not made better by eating; he states "if I don't eat I feel great"; he denies swallowing difficulty, reflux, heartburn, n/v, d/c, change in BMs etc; he admits to some stress w/ daugh  in jail but he doesn't think the symptoms are stress related; this has been going on 2-3 times daily (every time he eats) for ?[id number] symptoms of palpit, shaking, SOB lasts for about an hour & resolves spont- nothing he has tried has made it better; he thinks the symptoms are similoar to 2004 which lead to a stress test, then cath & stent; he has several cardiac risk factors as noted w/ poor med compliance... We discussed referral to [facility] for their assessment (?repeat cath?) & in the interim we wrote for NTG  to try the next time he gets a similar episode...     HBP, severe LVH on 2DEcho> on Metop50Bid; BP= 140/72 & he describes the above unusual "spells" after eating (but notes that if he doesn't eat he feels great)...    CAD> on ASA325; he is active at work washing trucks and denies angina, etc; last saw Kurt Kidd 6/14> CAD w/ intervention in 2004; cath in 2008 showed mild stenosis at the stent site;  He noted unusual palpit & SOB after eating in 2012- further eval w/ 2DEcho 4/12 showed incr wall thickness c/w severeLVH, norm LVF w/ EF=65-70%, mild MR, mild LAdil; Event monitor was done (?) but no report avail;  Symptoms improved at that time by adjusting his eating habits, decr alcohol & caffeine...    Hyperlipidemia> on Lip80 ?compliance; FLP 5/15 showed TChol 207, TG 109, HDL 48, LDL 137; needs to take Lip80 DAILY, & rec Lipid Clinic (he's refused Zetia due to cost)...    DM> on Metform500Bid; labs 5/15 showed BS=122, last A1c was 6.4 (Oct2014); we reviewed diet, exercise & wt reduction strategies...    Overweight> wt= 189# which is essentially unchanged- we reviewed diet, exercise, wt reduction strategies...    GI- GERD, Divertics, Hx ischemic colitis> prev on NexiumBid from Kurt Kidd for reflux esoph but he switched to [place] OTC instead; denies abd pain, n/v, d/c, blood seen; his Kurt Kidd had colon cancer- last colonoscopy in [facility] 2/14 was neg- wnl & f/u planned 51yrs (Kurt Kidd)...    GU- BPH> not  currently on meds; he was prev on Flomax for LTOS but off now & denies urinary symptoms; Kurt Kidd dx w/ prostate ca- pt's PSA 5/15 = 2.63...    DJD> he uses OTC analgesics as needed... We reviewed prob list, meds, xrays and labs> see below for updates >>   CXR 5/15 showed mild cardiomeg, atherosclerotic calcif in Ao, clear lungs, DJD spine...  LABS 5/15:  FLP- sl improved but still not at goals on Lip80 (?compliance);  Chems- ok w/ BS=122;  CBC- wnl;  TSH=1.84;  PSA=2.63...           Problem List:    BRONCHITIS, ACUTE (ICD-466.0) - he is an ex-smoker, quit ~69yrs... no recent problems and the exercise program has really helped... ~  CXR 4/13 showed heart at upper lim normal, clear lungs, NAD.Marland Kitchen. ~  CXR 5/15 showed mild cardiomeg, atherosclerotic calcif in Ao, clear lungs, DJD spine.  HYPERTENSION (ICD-401.9) - on METOPROLOL 50mg Bid... 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 ~  2DEcho 3/12 showed incr wall thickness c/w severeLVH, norm LVF w/ EF=65-70%, mild MR, mild LAdil... ~  4/13:  BP= 148/80 & he remains largely asymptomatic; CXR 4/13 showed heart size at upper lim norm, clear lungs, NAD...  ~  10/13:  BP= 130/72 & he continues to deny CP, palpt, SOB, edema, etc... ~  4/14:  on Metop50Bid; BP= 124/82 & he denies CP, palpit, dizzy, SOB, edema, etc... ~  10/14: on Metop50Bid; BP= 132/70 & he denies recurrent CP, palpit, dizzy, SOB, edema, etc. ~  5/15: Hx HBP & LVH on 2DEcho> on Metop50Bid; BP= 140/72...  CORONARY ARTERY DISEASE (ICD-414.00) - on ASA 325mg /d... followed by Kurt Kidd & his notes are reviewed... he has done better recently w/ secondary risk factor reduction... ~  NuclearStressTest 7/04 showed ?anteroseptal scar vs thinning, no ischemia, EF=61%... ~  initial cath 7/04 w/ 80% LAD lesion... stent placed by DrStuckey... ~  last  cath 8/08 showed non-obstructive CAD & prev stent site w/ <10% narrowing, EF=65%... ~  he continues asymptomatic w/o CP, palpit, SOB, edema, etc => still w/ mult risk factors & hi risk pt therefore rec f/u w/ Cards for f/u screening... ~  He saw Kurt Kidd 4/12> 2DEcho showed incr wall thickness c/w severeLVH, norm LVF w/ EF=65-70%, mild MR, mild LAdil.;  He noted rapid HR after eating which improved by adjusting his eating habits, decr alcohol & caffeine...  ~  He had f/u w/ DrKatz 6/14> CAD w/ intervention in 2004; cath in 2008 showed mild stenosis at the stent site;  He noted unusual palpit & SOB after eating in 2012- further eval w/ 2DEcho 3/12 showed incr wall thickness c/w severeLVH, norm LVF w/ EF=65-70%, mild MR, mild LAdil; Event monitor was done (?) but no report avail;  Symptoms improved by adjusting his eating habits, decr alcohol & caffeine... ~  EKG 6/14 showed NSR, rate73, LVH w/ repol abn... ~  5/15:  SEE ABOVE DESCRIPTION => referred to Cards for further eval...  HYPERLIPIDEMIA (ICD-272.4) - on LIPITOR 80mg /d now... he stopped his prev Fish Oil supplements ("they gave me gas")...  ~  [place] 4/07 on diet showed TChol 271, TG 443, HDL 31, LDL 155... Rec~ restart ZOCOR 80... ~  [place] 3/09 on Simva80 showed TChol 181, TG 166, HDL 36, LDL 112... same med & get wt down (then lost 14#) ~  [place] 8/09 on Simva80 showed TChol 186, TG 254, HDL 37, LDL 127... rec- same med, better diet, may need Fibrate etc. ~  FLP 1/10 on Simva80 showed TChol 190, TG 118, HDL 39, LDL 127... rec> get wt down! ~  FLP 7/10 on Simva80 showed TChol 179, TG 121, HDL 48, LDL 107 ~  11/11: not fasting for labs today & we decided to switch to LIPITOR 40mg /d. ~  FLP 4/13 ?on Lip40 showed TChol 230, TG 116, HDL 50, LDL 165... rec to take daily & incr to Lip80. ~  FLP 10/13 on Lip80 showed TChol 220, TG 138, HDL , LDL 163...  Known CAD w/ stent, Add ZETIA10 => he never refilled this Rx due to $$... ~  FLP 4/14 on Lip80 showed TChol  227, TG 136, HDL 43, LDL 173... Reminded to take med regularly... ~  FLP 10/14 on Lip80 showed TChol 252, TG 115, HDL 48, LDL 196... Pharm confirms med noncompliance even though pt insists he's taking it regularly w/ these bad numbers... ~  FLP 5/15 on Lip80 showed TChol 207, TG 109, HDL 48, LDL 137... We discussed lipid clinic referral...  DIABETES MELLITUS, BORDERLINE (ICD-790.29) - started meds 3/09 w/ METFORMIN 500- now on 1Bid. ~  labs 4/07 showed FBS=134, HgA1c=6.0.Marland KitchenMarland Kitchen ~  labs 3/09 showed BS= 140, HgA1c= 7.0.Marland KitchenMarland Kitchen Metformin started ~  labs 8/09 on Metform500Bid showed BS= 147, HgA1c= 6.5 ~  labs 1/10 on Metform500Bid showed BS= 144, A1c= 6.4.Marland Kitchen. rec> keep same. ~  labs 7/10 on Metform500Bid showed BS= 126, A1c= 6.2 ~  labs 11/11 on Metform500Bid showed BS= 108, A1c= 6.3 ~  Labs 4/13 on Metform500Bid showed BS= 107, A1c= 6.3 ~  Labs 10/13 on Metform500Bid showed BS= 110, A1c= not done. ~  Labs 4/14 on Metform500Bid showed BS= 111, A1c= 6.1 ~  Labs 10/14 on Metform500Bid showed BS=115, A1c=6.4 ~  Labs 5/15 on Metform500Bid showed BS= 122  OVERWEIGHT (ICD-278.02) - started at 212# Mar09... ~  8/09:  great job w/ diet and exercise program and weight down 14# to 198# ~  1/10:  unfortunately weight back up to 208# after the holidays. ~  7/10: weight down to 198# ~  11/11: weight = 202# ~  4/13: Weight = 199# ~  10/13: Weight = 197# ~  4/14: Weight = 193# ~  10/14:  Weight = 192# ~  5/15:  Weight = 189#  Hx of GERD (ICD-530.81) - hx gastritis on EGD by [facility] 8/04... he takes [place] 40mg Bid & denies dysphagia, abd pain, GERD, n/ v/ etc... ~  Abd Sonar 7/10 showed norm GB, norm ducts, incr liver echotexture c/w fatty liver dis, otherw norm- NAD.Marland Kitchen. ~  5/15: he switched to Nexium20 OTC prn...  DIVERTICULOSIS OF COLON (ICD-562.10) - colonoscopy 9/06 by Demetra Shiner showed ischemic colitis- he needs a full diagnostic colonoscopy 63 y/o Kurt Kidd had colon cancer... ~  f/u colonoscopy 9/09 by  DrPatterson showed divertics, polyp= polypoid mucosa w/o adenomatous change... f/u planned 65yrs. ~  f/u colonoscopy in [facility] 2/14 was neg- wnl & f/u planned 38yrs (Kurt Kidd)...  Hx of ISCHEMIC COLITIS (ICD-557.9) - hospitalized 9/06 w/ lower GIB secondary to ischemic colitis (required 2u transfusion)...   DEGENERATIVE JOINT DISEASE (ICD-715.90) & ?CTS w/ wrist pain> wrist splint Rx Qhs helps...  ONYCHOMYCOSIS (ICD-110.1) - Mar09 notes nail fungus on left hand and both feet... LAMISIL 250mg /d started... hands resolved now and toes are improved.   Past Surgical History  Procedure Laterality Date  . Angioplasty / stenting femoral  09/09/2002    Outpatient Encounter Prescriptions as of 07/07/2013  Medication Sig  . aspirin 325 MG EC tablet Take 325 mg by mouth daily.    Marland Kitchen atorvastatin (LIPITOR) 80 MG tablet TAKE ONE TABLET BY MOUTH EVERY DAY  . esomeprazole (NEXIUM) 20 MG capsule Take 20 mg by mouth daily before breakfast.  . metFORMIN (GLUCOPHAGE) 500 MG tablet TAKE ONE TABLET BY MOUTH TWICE DAILY WITH MEALS  . metoprolol (LOPRESSOR) 50 MG tablet TAKE ONE TABLET BY MOUTH TWICE DAILY  . nitroGLYCERIN (NITROSTAT) 0.4 MG SL tablet Place 1 tablet (0.4 mg total) under the tongue every 5 (five) minutes as needed.  . ONE TOUCH ULTRA TEST test strip   . tamsulosin (FLOMAX) 0.4 MG CAPS capsule Take 0.4 mg by mouth daily after supper.  . [DISCONTINUED] nitroGLYCERIN (NITROSTAT) 0.4 MG SL tablet Place 0.4 mg under the tongue every 5 (five) minutes as needed.    . ALPRAZolam (XANAX) 0.5 MG tablet TAKE 1/2 TO 1 TABLET BY MOUTH THREE TIMES DAILY AS DIRECTED    No Known Allergies   Current Medications, Allergies, Past Medical History, Past Surgical History, Family History, and Social History were reviewed in [employer] record.   Review of Systems    The patient denies fever, chills, sweats, anorexia, fatigue, weakness, malaise, weight loss, sleep disorder, blurring,  diplopia, eye irritation, eye discharge, vision loss, eye pain, photophobia, earache, ear discharge, tinnitus, decreased hearing, nasal congestion, nosebleeds, sore throat, hoarseness, chest pain, palpitations, syncope, dyspnea on exertion, orthopnea, PND, peripheral edema, cough, dyspnea at rest, excessive sputum, hemoptysis, wheezing, pleurisy, nausea, vomiting, diarrhea, constipation, change in bowel habits, abdominal pain, melena, hematochezia, jaundice, gas/bloating, indigestion/heartburn, dysphagia, odynophagia, dysuria, hematuria, urinary frequency, urinary hesitancy, nocturia, incontinence, back pain, joint pain, joint swelling, muscle cramps, muscle weakness, stiffness, arthritis, sciatica, restless legs, leg pain at night, leg pain with exertion, rash, itching, dryness, suspicious lesions, paralysis, paresthesias, seizures, tremors, vertigo, transient blindness, frequent falls, frequent headaches, difficulty  walking, depression, anxiety, memory loss, confusion, cold intolerance, heat intolerance, polydipsia, polyphagia, polyuria, unusual weight change, abnormal bruising, bleeding, enlarged lymph nodes, urticaria, allergic rash, hay fever, and recurrent infections.     Objective:   Physical Exam     WD, Overweight, 63 y/o WM in NAD...  GENERAL:  Alert & oriented; pleasant & cooperative... HEENT:  [place]/AT, EOM-wnl, PERRLA, EACs-clear, TMs-wnl, NOSE-clear, THROAT-clear & wnl. NECK:  Supple w/ fairROM; no JVD; normal carotid impulses w/o bruits; no thyromegaly or nodules palpated; no lymphadenopathy. CHEST:  Clear to P & A; without wheezes/ rales/ or rhonchi. HEART:  Regular Rhythm;  gr 1/6 SEM without rubs or gallops detected... ABDOMEN:  Soft & min tender in RUQ; normal bowel sounds; no organomegaly or masses palpated... EXT: without deformities, mild arthritic changes; no varicose veins/ venous insuffic/ or edema. NEURO:  CN's intact;  no focal neuro deficits... DERM:  onychomycosis improved as  noted...  RADIOLOGY DATA:  Reviewed in the EPIC EMR & discussed w/ the patient...  LABORATORY DATA:  Reviewed in the EPIC EMR & discussed w/ the patient...   Assessment:      HBP & severe LVH>  BP seems well controlled on BBlocker monotherapy; we reviewed no salt, & wt reduction...   CAD> on ASA325; he is too sedentary but denies angina, etc; needs to incr exercise program & f/u w/ [facility] for poss stress testing in light of his unusual symptoms and history....  Hyperlipidemia> on Lip80; FLP shows improved LDL=137 but still not near goal of <70; offered lipid clinic but he declined; ? If taking med regularly & we discussed this...  DM> on Metform500Bid; BS=122, A1c= 6.4; we reviewed diet, exercise & wt reduction strategies...  Overweight> wt= 189# & he knows the importance of wt reduction...  GI> GERD, Divertics, Hx ischemic colitis> on Omep20 for reflux esoph; continue same.  DJD> he uses OTC analgesics as needed...     Plan:      Patient's Medications  New Prescriptions   ALPRAZOLAM (XANAX) 0.5 MG TABLET    TAKE 1/2 TO 1 TABLET BY MOUTH THREE TIMES DAILY AS DIRECTED  Previous Medications   ASPIRIN 325 MG EC TABLET    Take 325 mg by mouth daily.     ATORVASTATIN (LIPITOR) 80 MG TABLET    TAKE ONE TABLET BY MOUTH EVERY DAY   ESOMEPRAZOLE (NEXIUM) 20 MG CAPSULE    Take 20 mg by mouth daily before breakfast.   METFORMIN (GLUCOPHAGE) 500 MG TABLET    TAKE ONE TABLET BY MOUTH TWICE DAILY WITH MEALS   METOPROLOL (LOPRESSOR) 50 MG TABLET    TAKE ONE TABLET BY MOUTH TWICE DAILY   ONE TOUCH ULTRA TEST TEST STRIP       TAMSULOSIN (FLOMAX) 0.4 MG CAPS CAPSULE    Take 0.4 mg by mouth daily after supper.  Modified Medications   Modified Medication Previous Medication   NITROGLYCERIN (NITROSTAT) 0.4 MG SL TABLET nitroGLYCERIN (NITROSTAT) 0.4 MG SL tablet      Place 1 tablet (0.4 mg total) under the tongue every 5 (five) minutes as needed.    Place 0.4 mg under the tongue every 5 (five)  minutes as needed.    Discontinued Medications   No medications on file

## 2013-07-13 ENCOUNTER — Ambulatory Visit (INDEPENDENT_AMBULATORY_CARE_PROVIDER_SITE_OTHER): Payer: BC Managed Care – PPO | Admitting: Cardiology

## 2013-07-13 ENCOUNTER — Encounter: Payer: Self-pay | Admitting: Cardiology

## 2013-07-13 VITALS — BP 142/78 | HR 62 | Ht 66.0 in | Wt 189.8 lb

## 2013-07-13 DIAGNOSIS — E785 Hyperlipidemia, unspecified: Secondary | ICD-10-CM

## 2013-07-13 DIAGNOSIS — C7410 Malignant neoplasm of medulla of unspecified adrenal gland: Secondary | ICD-10-CM

## 2013-07-13 DIAGNOSIS — I5189 Other ill-defined heart diseases: Secondary | ICD-10-CM

## 2013-07-13 DIAGNOSIS — E119 Type 2 diabetes mellitus without complications: Secondary | ICD-10-CM

## 2013-07-13 DIAGNOSIS — I340 Nonrheumatic mitral (valve) insufficiency: Secondary | ICD-10-CM

## 2013-07-13 DIAGNOSIS — I10 Essential (primary) hypertension: Secondary | ICD-10-CM

## 2013-07-13 DIAGNOSIS — R002 Palpitations: Secondary | ICD-10-CM

## 2013-07-13 DIAGNOSIS — E340 Carcinoid syndrome: Secondary | ICD-10-CM

## 2013-07-13 DIAGNOSIS — I2510 Atherosclerotic heart disease of native coronary artery without angina pectoris: Secondary | ICD-10-CM

## 2013-07-13 DIAGNOSIS — C7490 Malignant neoplasm of unspecified part of unspecified adrenal gland: Secondary | ICD-10-CM

## 2013-07-13 DIAGNOSIS — I059 Rheumatic mitral valve disease, unspecified: Secondary | ICD-10-CM

## 2013-07-13 DIAGNOSIS — R0602 Shortness of breath: Secondary | ICD-10-CM

## 2013-07-13 DIAGNOSIS — R011 Cardiac murmur, unspecified: Secondary | ICD-10-CM

## 2013-07-13 NOTE — Assessment & Plan Note (Signed)
The patient had an intervention in 2004. Catheterization in 2008 showed that his stent was working quite well. It is not clear what his current symptoms represent. I feel that we should proceed with nuclear scanning before considering catheterization.

## 2013-07-13 NOTE — Assessment & Plan Note (Signed)
Diabetes is being treated. It does not appear that his episodes represent hypoglycemia or hyperglycemia.

## 2013-07-13 NOTE — Assessment & Plan Note (Signed)
He needs a followup 2-D echo to assess further his mitral regurgitation aortic valve sclerosis. We will also reassess LV function and LV thickness.

## 2013-07-13 NOTE — Assessment & Plan Note (Signed)
Blood pressure is controlled. No change in therapy. 

## 2013-07-13 NOTE — Assessment & Plan Note (Addendum)
The etiology of his current symptoms is not clear. He has had some difficulties feeling symptoms after eating in the past. Monitor in the past did not show arrhythmias. The septum appears to be getting significantly worse. He eats and then in the range of 1 hour or 1-1/2 hours later, he feels poorly. He develops increased heart rate and gets shortness of breath. He has some discomfort in his left shoulder. We will proceed with a nuclear stress study. I wonder if he could have an unusual endocrine abnormality that is responding to his food intake. I'll see him back for followup. I will order screening plasma metanephrines. I will also look into screening him for carcinoid if this has not been done.  As part of today's evaluation I spent greater than 25 minutes with is total care. More than half of this time was spent with direct contact with him discussing all of the issues.

## 2013-07-13 NOTE — Patient Instructions (Signed)
Your physician has requested that you have an echocardiogram. Echocardiography is a painless test that uses sound waves to create images of your heart. It provides your doctor with information about the size and shape of your heart and how well your heart's chambers and valves are working. This procedure takes approximately one hour. There are no restrictions for this procedure.   Your physician has requested that you have a lexiscan myoview. For further information please visit [email or web address]. Please follow instruction sheet, as given.  Your physician recommends that you continue on your current medications as directed. Please refer to the Current Medication list given to you today.  Your physician recommends that you return for lab work in: today (urine collection)  Your physician recommends that you schedule a follow-up appointment in: after tests

## 2013-07-13 NOTE — Assessment & Plan Note (Signed)
The patient is getting guideline directed therapy. No change in therapy.

## 2013-07-13 NOTE — Assessment & Plan Note (Signed)
Mitral regurgitation will be reassessed by echo.

## 2013-07-13 NOTE — Progress Notes (Signed)
Patient ID: Kurt Kidd, male   DOB: [birth year], 63 y.o.   MRN: [id number]    HPI  Patient is seen today to followup his history of coronary artery disease. The patient had an intervention in 2004. Over the years he said a very unusual symptom with increased heart rate and some shortness of breath after eating. He adjusted his diet in the past. This remained a mild stable symptom over time. He did have a followup catheterization in 2008. His stent site looked good. He has a murmur and has had both mild aortic sclerosis and mild mitral regurgitation. Over the past many months he's had marked increase in his symptoms. They are very difficult to explain. It is of note that we did a very careful monitor in the past when he had a sensation of tachycardia after eating. He has sinus rhythm then.  Currently the patient is bothered by significant symptoms that occur an hour to an hour and a half after eating. He notices a combination of increased heart rate, discomfort in his left shoulder, hands shaking, significant shortness of breath. He is able to rest and he improves. He works actively washing trucks. He does not have any significant symptoms of this type with exercise.  As part of today's evaluation I have reviewed carefully the note from Dr. Lenna Gilford. He describes the symptoms exactly as I have described above.  No Known Allergies  Current Outpatient Prescriptions  Medication Sig Dispense Refill  . ALPRAZolam (XANAX) 0.5 MG tablet TAKE 1/2 TO 1 TABLET BY MOUTH THREE TIMES DAILY AS DIRECTED  90 tablet  5  . aspirin 325 MG EC tablet Take 325 mg by mouth daily.        Marland Kitchen atorvastatin (LIPITOR) 80 MG tablet TAKE ONE TABLET BY MOUTH EVERY DAY  90 tablet  0  . esomeprazole (NEXIUM) 20 MG capsule Take 20 mg by mouth daily before breakfast.      . metFORMIN (GLUCOPHAGE) 500 MG tablet TAKE ONE TABLET BY MOUTH TWICE DAILY WITH MEALS  180 tablet  0  . metoprolol (LOPRESSOR) 50 MG tablet TAKE ONE TABLET BY MOUTH  TWICE DAILY  60 tablet  5  . nitroGLYCERIN (NITROSTAT) 0.4 MG SL tablet Place 1 tablet (0.4 mg total) under the tongue every 5 (five) minutes as needed.  25 tablet  1  . ONE TOUCH ULTRA TEST test strip       . tamsulosin (FLOMAX) 0.4 MG CAPS capsule Take 0.4 mg by mouth daily after supper.       No current facility-administered medications for this visit.    History   Social History  . Marital Status: Married    Spouse Name: N/A    Number of Children: N/A  . 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 Smoking status: Former Smoker -- 1.00 packs/day for 26 years    Types: Cigarettes    Quit date: 03/05/1990  . Smokeless tobacco: Not on file  . Alcohol Use: Yes     Comment: 2 drinks per day  . Drug Use: Not on file  . Sexual Activity: Not on file   Other Topics Concern  . Not on file   Social History Narrative  . No narrative on file    Family History  Problem Relation Age of Onset  . Heart failure Mother   . Diabetes Father     father also had ASHD/  CABG  . Colon cancer Brother     Brother also had AAA repair    Past Medical History  Diagnosis Date  . Hypertension   . CAD (coronary artery disease)     DES.. 2004 /  catheterization 2008, 10% narrowing stent site, pain not cardiac  . Dyslipidemia   . Murmur   . Diabetes mellitus   . Overweight   . GERD (gastroesophageal reflux disease)   . Diverticulosis   . Colitis, ischemic   . Degenerative joint disease   . Onychomycosis   . Palpitations     Rapid heartbeat and shortness of breath after eating 2008 recurrent episode 2012  . Ejection fraction     65-70%, echo, March, 2012  . Mitral regurgitation     Echo, mild, 2012    Past Surgical History  Procedure Laterality Date  . Angioplasty / stenting femoral  09/09/2002    Patient Active Problem List   Diagnosis Date Noted  . Adjustment disorder with mixed anxiety and depressed mood  07/07/2013  . BPH (benign prostatic hyperplasia) 12/29/2012  . Ejection fraction   . Hypertension   . CAD (coronary artery disease)   . Dyslipidemia   . Diabetes mellitus   . GERD (gastroesophageal reflux disease)   . Diverticulosis   . Colitis, ischemic   . Onychomycosis   . Palpitations   . Mitral regurgitation   . SYSTOLIC MURMUR 29/79/8921  . OVERWEIGHT 06/01/2007  . HYPERLIPIDEMIA 04/14/2007  . BRONCHITIS, ACUTE 04/14/2007  . DEGENERATIVE JOINT DISEASE 04/14/2007    ROS   Patient denies fever, chills, headache, sweats, rash, change in vision, change in hearing, chest pain, cough, nausea vomiting, urinary symptoms. All other systems are reviewed and are negative.    PHYSICAL EXAM   Patient is overweight. He is oriented to person time and place. Affect is normal. Head is atraumatic. Sclera and conjunctiva are normal. There is no jugular venous distention. Lungs are clear. Respiratory effort is nonlabored. Cardiac exam reveals S1 and S2. There is a systolic murmur. I'm not sure if I'm hearing his MR or aortic sclerosis. Abdomen is soft. There is no peripheral edema. There are no musculoskeletal deformities. There are no skin rashes. Neurologic is grossly intact.  Filed Vitals:   07/13/13 0932  BP: 142/78  Pulse: 62  Height: 5\' 6"  (1.676 m)  Weight: 189 lb 12.8 oz (86.093 kg)   EKG is done today and reviewed by me. He has sinus rhythm. He has old increased voltage and marked T wave changes that are unchanged. Historically this is consistent with his significant left ventricular hypertrophy.  ASSESSMENT & PLAN

## 2013-07-14 ENCOUNTER — Other Ambulatory Visit: Payer: BC Managed Care – PPO

## 2013-07-14 DIAGNOSIS — I5189 Other ill-defined heart diseases: Secondary | ICD-10-CM

## 2013-07-14 DIAGNOSIS — E340 Carcinoid syndrome: Secondary | ICD-10-CM

## 2013-07-14 DIAGNOSIS — C7410 Malignant neoplasm of medulla of unspecified adrenal gland: Secondary | ICD-10-CM

## 2013-07-18 LAB — CATECHOLAMINES, FRACTIONATED, URINE, 24 HOUR
CALCULATED TOTAL (E+ NE): 64 ug/(24.h) (ref 26–121)
Creatinine, Urine mg/day-CATEUR: 0.96 g/(24.h) (ref 0.63–2.50)
Dopamine, 24 hr Urine: 293 mcg/24 h (ref 52–480)
Epinephrine, 24 hr Urine: 11 mcg/24 h (ref 2–24)
NOREPINEPHRINE, 24 HR UR: 53 ug/(24.h) (ref 15–100)
Total Volume - CF 24Hr U: 1100 mL

## 2013-07-18 LAB — 5 HIAA, QUANTITATIVE, URINE, 24 HOUR: 5-HIAA, 24 Hr Urine: 3.6 mg/24 h (ref <=6.0)

## 2013-07-18 LAB — METANEPHRINES, URINE, 24 HOUR
METANEPH TOTAL UR: 401 ug/(24.h) (ref 224–832)
Metanephrines, Ur: 153 mcg/24 h (ref 90–315)
Normetanephrine, 24H Ur: 248 mcg/24 h (ref 122–676)

## 2013-07-21 ENCOUNTER — Encounter: Payer: Self-pay | Admitting: Cardiology

## 2013-07-22 ENCOUNTER — Telehealth: Payer: Self-pay | Admitting: Cardiology

## 2013-07-22 NOTE — Telephone Encounter (Signed)
Follow up     Pt's wife returned our call.   Please give them a call back.

## 2013-07-23 NOTE — Telephone Encounter (Signed)
Patient would like results of urine test. Upset that no one has called him. Please call and advise.

## 2013-07-23 NOTE — Telephone Encounter (Signed)
Spoke with patient and gave him results of recent urine collection tests.

## 2013-07-29 ENCOUNTER — Ambulatory Visit (HOSPITAL_BASED_OUTPATIENT_CLINIC_OR_DEPARTMENT_OTHER): Payer: BC Managed Care – PPO | Admitting: Radiology

## 2013-07-29 ENCOUNTER — Encounter: Payer: Self-pay | Admitting: Cardiology

## 2013-07-29 ENCOUNTER — Ambulatory Visit (HOSPITAL_COMMUNITY): Payer: BC Managed Care – PPO | Attending: Cardiology | Admitting: Cardiology

## 2013-07-29 VITALS — BP 157/79 | Ht 66.0 in | Wt 186.0 lb

## 2013-07-29 DIAGNOSIS — I340 Nonrheumatic mitral (valve) insufficiency: Secondary | ICD-10-CM

## 2013-07-29 DIAGNOSIS — I2510 Atherosclerotic heart disease of native coronary artery without angina pectoris: Secondary | ICD-10-CM

## 2013-07-29 DIAGNOSIS — R079 Chest pain, unspecified: Secondary | ICD-10-CM

## 2013-07-29 DIAGNOSIS — R5383 Other fatigue: Secondary | ICD-10-CM

## 2013-07-29 DIAGNOSIS — R0602 Shortness of breath: Secondary | ICD-10-CM

## 2013-07-29 DIAGNOSIS — R0989 Other specified symptoms and signs involving the circulatory and respiratory systems: Secondary | ICD-10-CM | POA: Insufficient documentation

## 2013-07-29 DIAGNOSIS — R5381 Other malaise: Secondary | ICD-10-CM | POA: Insufficient documentation

## 2013-07-29 DIAGNOSIS — R0609 Other forms of dyspnea: Secondary | ICD-10-CM | POA: Insufficient documentation

## 2013-07-29 DIAGNOSIS — I059 Rheumatic mitral valve disease, unspecified: Secondary | ICD-10-CM

## 2013-07-29 MED ORDER — TECHNETIUM TC 99M SESTAMIBI GENERIC - CARDIOLITE
33.0000 | Freq: Once | INTRAVENOUS | Status: AC | PRN
  Administered 2013-07-29: 33 via INTRAVENOUS

## 2013-07-29 MED ORDER — REGADENOSON 0.4 MG/5ML IV SOLN
0.4000 mg | Freq: Once | INTRAVENOUS | Status: AC
Start: 2013-07-29 — End: 2013-07-29
  Administered 2013-07-29: 0.4 mg via INTRAVENOUS

## 2013-07-29 MED ORDER — TECHNETIUM TC 99M SESTAMIBI GENERIC - CARDIOLITE
11.0000 | Freq: Once | INTRAVENOUS | Status: AC | PRN
  Administered 2013-07-29: 11 via INTRAVENOUS

## 2013-07-29 NOTE — Progress Notes (Signed)
Echo performed. 

## 2013-07-29 NOTE — Progress Notes (Signed)
[facility] [address] [place], [place] [address] [phone]    Cardiology Nuclear Med Study  Kurt Kidd is a 63 y.o. male     MRN : [id number]     DOB: Feb 07, 1951  Procedure Date: 07/29/2013  Nuclear Med Background Indication for Stress Test:  Evaluation for Ischemia and Stent Patency History:  CAD-CATH-STENT-LAD 3/12 ECHO: EF: 65-70% severe LVH  Cardiac Risk Factors: Family History - CAD, History of Smoking, Hypertension, IDDM Type 2 and Lipids  Symptoms:  Chest Pain, DOE, Fatigue and SOB   Nuclear Pre-Procedure Caffeine/Decaff Intake:  None NPO After: 6pm   Lungs:  clear O2 Sat: 96% on room air. IV 0.9% NS with Angio Cath:  22g  IV Site: R Hand  IV Started by:  Crissie Figures, RN  Chest Size (in):  46 Cup Size: n/a  Height: 5\' 6"  (1.676 m)  Weight:  186 lb (84.369 kg)  BMI:  Body mass index is 30.04 kg/(m^2). Tech Comments:  N/A    Nuclear Med Study 1 or 2 day study: 1 day  Stress Test Type:  Lexiscan  Reading MD: N/A  Order Authorizing Provider:  Dola Argyle, MD  Resting Radionuclide: Technetium 31m Sestamibi  Resting Radionuclide Dose: 11.0 mCi   Stress Radionuclide:  Technetium 33m Sestamibi  Stress Radionuclide Dose: 33.0 mCi           Stress Protocol Rest HR: 65 Stress HR: 102  Rest BP: 157/79 Stress BP: 153/73  Exercise Time (min): n/a METS: n/a   Predicted Max HR: 156 bpm % Max HR: 65.38 bpm Rate Pressure Product: 16014   Dose of Adenosine (mg):  n/a Dose of Lexiscan: 0.4 mg  Dose of Atropine (mg): n/a Dose of Dobutamine: n/a mcg/kg/min (at max HR)  Stress Test Technologist: Perrin Maltese, EMT-P  Nuclear Technologist:  Annye Rusk, CNMT     Rest Procedure:  Myocardial perfusion imaging was performed at rest 45 minutes following the intravenous administration of Technetium 58m Sestamibi. Rest ECG: SR, diffusely negative T waves  Stress Procedure:  The patient received IV Lexiscan 0.4 mg over 15-seconds.   Technetium 67m Sestamibi injected at 30-seconds. The patient had sob, fatigue, and a woozy feeling with the Lexiscan injection. Quantitative spect images were obtained after a 45 minute delay. Stress ECG: No significant change from baseline ECG  QPS Raw Data Images:  Mild diaphragmatic attenuation.  Normal left ventricular size. Stress Images:  There is decrease uptake in the apical inferior and mid inferolateral walls.  Rest Images:  Normal homogeneous uptake in all areas of the myocardium. Subtraction (SDS):  A small area, moderate severity, reversible defect in the apical inferior and mid inferolateral walls. (SDS 3) Transient Ischemic Dilatation (Normal <1.22):  1.13 Lung/Heart Ratio (Normal <0.45):  0.28  Quantitative Gated Spect Images QGS EDV:  128 ml QGS ESV:  60 ml  Impression Exercise Capacity:  Lexiscan with no exercise. BP Response:  Normal blood pressure response. Clinical Symptoms:  There is dyspnea. ECG Impression:  No significant ST segment change suggestive of ischemia. Comparison with Prior Nuclear Study: No previous nuclear study performed  Overall Impression:  Low risk stress nuclear study with a small area, moderate severity reversible defect in the RCA territory. .  LV Ejection Fraction: 53%.  LV Wall Motion:  NL LV Function; NL Wall Motion   Dorothy Spark 07/29/2013

## 2013-08-02 ENCOUNTER — Encounter: Payer: Self-pay | Admitting: Cardiology

## 2013-08-03 ENCOUNTER — Ambulatory Visit (INDEPENDENT_AMBULATORY_CARE_PROVIDER_SITE_OTHER): Payer: BC Managed Care – PPO | Admitting: Cardiology

## 2013-08-03 ENCOUNTER — Encounter: Payer: Self-pay | Admitting: Cardiology

## 2013-08-03 VITALS — BP 138/83 | HR 74 | Ht 66.0 in | Wt 186.0 lb

## 2013-08-03 DIAGNOSIS — R002 Palpitations: Secondary | ICD-10-CM

## 2013-08-03 DIAGNOSIS — I059 Rheumatic mitral valve disease, unspecified: Secondary | ICD-10-CM

## 2013-08-03 DIAGNOSIS — R9430 Abnormal result of cardiovascular function study, unspecified: Secondary | ICD-10-CM

## 2013-08-03 DIAGNOSIS — I10 Essential (primary) hypertension: Secondary | ICD-10-CM

## 2013-08-03 DIAGNOSIS — E785 Hyperlipidemia, unspecified: Secondary | ICD-10-CM

## 2013-08-03 DIAGNOSIS — IMO0002 Reserved for concepts with insufficient information to code with codable children: Secondary | ICD-10-CM

## 2013-08-03 DIAGNOSIS — I340 Nonrheumatic mitral (valve) insufficiency: Secondary | ICD-10-CM

## 2013-08-03 DIAGNOSIS — R0989 Other specified symptoms and signs involving the circulatory and respiratory systems: Secondary | ICD-10-CM

## 2013-08-03 DIAGNOSIS — I2510 Atherosclerotic heart disease of native coronary artery without angina pectoris: Secondary | ICD-10-CM

## 2013-08-03 DIAGNOSIS — R0602 Shortness of breath: Secondary | ICD-10-CM

## 2013-08-03 MED ORDER — ASPIRIN 81 MG PO TBEC: 81.0000 mg | DELAYED_RELEASE_TABLET | Freq: Every day | ORAL | Status: DC

## 2013-08-03 NOTE — Patient Instructions (Signed)
Your physician has recommended you make the following change in your medication: start taking 81 mg aspirin daily  Dr Ron Parker is referring you to a pcp.   Your physician recommends that you schedule a follow-up appointment in: 9 weeks  Please call Jeani Hawking, Dr Burman Riis nurse, @ [phone] in about 2 weeks to let us know how you are feeling.

## 2013-08-03 NOTE — Assessment & Plan Note (Signed)
He ejection fraction remains normal by echo, May, 2015.

## 2013-08-03 NOTE — Assessment & Plan Note (Signed)
I reviewed the results of the studies with the patient and his wife at great length. There is no evidence of carcinoid or pheochromocytoma causing his problems. He says he feels much better off metformin. However he then tells me that he is off all of his other medicines also including his aspirin and atorvastatin. I feel that the mild abnormality on his nuclear stress test is not causing his current symptoms. I have convinced him to restart aspirin 81 mg daily. We will communicate with him over the next 2 weeks. If he remains stable we will restart atorvastatin 20 mg.  As part of today's evaluation I spent greater than 25 minutes with is total care. I had discussions at length with the patient and his wife about all of his medicines and when to restart his medicines. We also try to help make sure that he had primary care followup. More than half of the 25 minutes was spent with direct contact with the patient and his wife.

## 2013-08-03 NOTE — Progress Notes (Signed)
Patient ID: Kurt Kidd, male   DOB: May 04, 1950, 63 y.o.   MRN: [id number]    HPI  Patient is seen for followup of his overall cardiac care. He had an appointment scheduled for June 10. I called and asked him to come in today so that I could discuss his nuclear study with him and see how he's doing. When I saw him recently, he described the episodes that he has feeling short of breath with tachycardia after eating. Etiology was not clear. 24 hour urine was collected for catecholamines and 5 HIAA. These were normal. 2-D echo was done. There was excellent left ventricular function. Nuclear study shows a small area of possible mild ischemia. I called him to come in today so that we can discuss this at length. He is here today with his wife. He tells me that he is now discovered the reason for his problems. He thinks it is related to metformin. He has stopped the metformin and he is not having any more symptoms. However he also tells me that he has stopped his other medicines. This includes the fact that he is not on aspirin or a statin at this point. Overall he feels well. He is not having recurrence of these spells that he was having before.  No Known Allergies  Current Outpatient Prescriptions  Medication Sig Dispense Refill  . ALPRAZolam (XANAX) 0.5 MG tablet TAKE 1/2 TO 1 TABLET BY MOUTH THREE TIMES DAILY AS DIRECTED  90 tablet  5  . aspirin 81 MG EC tablet Take 1 tablet (81 mg total) by mouth daily.  30 tablet  0  . atorvastatin (LIPITOR) 80 MG tablet TAKE ONE TABLET BY MOUTH EVERY DAY  90 tablet  0  . esomeprazole (NEXIUM) 20 MG capsule Take 20 mg by mouth daily before breakfast.      . metFORMIN (GLUCOPHAGE) 500 MG tablet TAKE ONE TABLET BY MOUTH TWICE DAILY WITH MEALS  180 tablet  0  . metoprolol (LOPRESSOR) 50 MG tablet TAKE ONE TABLET BY MOUTH TWICE DAILY  60 tablet  5  . nitroGLYCERIN (NITROSTAT) 0.4 MG SL tablet Place 1 tablet (0.4 mg total) under the tongue every 5 (five) minutes as  needed.  25 tablet  1  . ONE TOUCH ULTRA TEST test strip       . tamsulosin (FLOMAX) 0.4 MG CAPS capsule Take 0.4 mg by mouth daily after supper.       No current facility-administered medications for this visit.    History   Social History  . Marital Status: Married    Spouse Name: N/A    Number of Children: N/A  . 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 Smoking status: Former Smoker -- 1.00 packs/day for 26 years    Types: Cigarettes    Quit date: 03/05/1990  . Smokeless tobacco: Not on file  . Alcohol Use: Yes     Comment: 2 drinks per day  . Drug Use: Not on file  . Sexual Activity: Not on file   Other Topics Concern  . Not on file   Social History Narrative  . No narrative on file    Family History  Problem Relation Age of Onset  . Heart failure Mother   . Diabetes Father     father also had ASHD/ CABG  . Colon cancer Brother     Brother also had AAA repair  Past Medical History  Diagnosis Date  . Hypertension   . CAD (coronary artery disease)     DES.. 2004 /  catheterization 2008, 10% narrowing stent site, pain not cardiac  . Dyslipidemia   . Murmur   . Diabetes mellitus   . Overweight   . GERD (gastroesophageal reflux disease)   . Diverticulosis   . Colitis, ischemic   . Degenerative joint disease   . Onychomycosis   . Palpitations     Rapid heartbeat and shortness of breath after eating 2008 recurrent episode 2012  . Ejection fraction     65-70%, echo, March, 2012  . Mitral regurgitation     Echo, mild, 2012    Past Surgical History  Procedure Laterality Date  . Angioplasty / stenting femoral  09/09/2002    Patient Active Problem List   Diagnosis Date Noted  . Shortness of breath 07/13/2013  . Adjustment disorder with mixed anxiety and depressed mood 07/07/2013  . BPH (benign prostatic hyperplasia) 12/29/2012  . Ejection fraction   . Hypertension   . CAD (coronary  artery disease)   . Dyslipidemia   . Diabetes mellitus   . GERD (gastroesophageal reflux disease)   . Diverticulosis   . Colitis, ischemic   . Onychomycosis   . Palpitations   . Mitral regurgitation   . SYSTOLIC MURMUR 12/45/8099  . OVERWEIGHT 06/01/2007  . HYPERLIPIDEMIA 04/14/2007  . BRONCHITIS, ACUTE 04/14/2007  . DEGENERATIVE JOINT DISEASE 04/14/2007    ROS   Patient denies fever, chills, headache, sweats, rash, change in vision, change in hearing, chest pain, cough, nausea vomiting, urinary symptoms. All other systems are reviewed and are negative.  PHYSICAL EXAM  Patient is oriented to person time and place. Affect is normal. Head is atraumatic. Sclera and conjunctiva are normal. There is no jugulovenous distention. Lungs are clear. Respiratory effort is nonlabored. Cardiac exam reveals S1 and S2. Abdomen is soft. There is no peripheral edema. There are no musculoskeletal deformities. There are no skin rashes.  Filed Vitals:   08/03/13 0950  BP: 138/83  Pulse: 74  Height: 5\' 6"  (1.676 m)  Weight: 186 lb (84.369 kg)     ASSESSMENT & PLAN

## 2013-08-03 NOTE — Assessment & Plan Note (Signed)
Blood pressure is controlled. No change in therapy. 

## 2013-08-03 NOTE — Assessment & Plan Note (Signed)
Patient has moderate mitral regurgitation by very recent echo. This would be followed over time. I feel it is not causing his current symptoms.

## 2013-08-03 NOTE — Assessment & Plan Note (Signed)
He has not had any significant recent palpitations. No further workup.

## 2013-08-03 NOTE — Assessment & Plan Note (Signed)
Coronary disease is stable. The patient's most recent nuclear study from May, 2015, reveals a very small area of probable ischemia. I am not convinced that this finding is causing his current symptoms. We will push hard to try to get him back on all of his appropriate cardiac medications. If he has further symptoms and I cannot explain, repeat catheterization will be indicated.

## 2013-08-03 NOTE — Assessment & Plan Note (Signed)
Patient recently took himself off his Lipitor. I would like to restart it. He is hesitant. His wife was him to restart it. We will try to restarted soon after we have him back on aspirin.

## 2013-08-12 ENCOUNTER — Ambulatory Visit: Payer: BC Managed Care – PPO | Admitting: Cardiology

## 2013-08-24 ENCOUNTER — Ambulatory Visit (INDEPENDENT_AMBULATORY_CARE_PROVIDER_SITE_OTHER): Payer: BC Managed Care – PPO | Admitting: Pulmonary Disease

## 2013-08-24 ENCOUNTER — Encounter: Payer: Self-pay | Admitting: Pulmonary Disease

## 2013-08-24 VITALS — BP 140/66 | HR 67 | Temp 97.8°F | Ht 66.0 in | Wt 191.0 lb

## 2013-08-24 DIAGNOSIS — M5441 Lumbago with sciatica, right side: Secondary | ICD-10-CM

## 2013-08-24 DIAGNOSIS — E119 Type 2 diabetes mellitus without complications: Secondary | ICD-10-CM

## 2013-08-24 DIAGNOSIS — M545 Low back pain, unspecified: Secondary | ICD-10-CM | POA: Insufficient documentation

## 2013-08-24 DIAGNOSIS — E1165 Type 2 diabetes mellitus with hyperglycemia: Secondary | ICD-10-CM

## 2013-08-24 DIAGNOSIS — I2510 Atherosclerotic heart disease of native coronary artery without angina pectoris: Secondary | ICD-10-CM

## 2013-08-24 DIAGNOSIS — I10 Essential (primary) hypertension: Secondary | ICD-10-CM

## 2013-08-24 DIAGNOSIS — E785 Hyperlipidemia, unspecified: Secondary | ICD-10-CM

## 2013-08-24 DIAGNOSIS — I059 Rheumatic mitral valve disease, unspecified: Secondary | ICD-10-CM

## 2013-08-24 DIAGNOSIS — M5430 Sciatica, unspecified side: Secondary | ICD-10-CM

## 2013-08-24 DIAGNOSIS — I340 Nonrheumatic mitral (valve) insufficiency: Secondary | ICD-10-CM

## 2013-08-24 DIAGNOSIS — I209 Angina pectoris, unspecified: Secondary | ICD-10-CM

## 2013-08-24 DIAGNOSIS — E663 Overweight: Secondary | ICD-10-CM

## 2013-08-24 DIAGNOSIS — I25119 Atherosclerotic heart disease of native coronary artery with unspecified angina pectoris: Secondary | ICD-10-CM

## 2013-08-24 MED ORDER — METHOCARBAMOL 500 MG PO TABS: 500.0000 mg | ORAL_TABLET | Freq: Three times a day (TID) | ORAL | Status: DC | PRN

## 2013-08-24 NOTE — Progress Notes (Signed)
Subjective:     Patient ID: Kurt Kidd, male   DOB: 1950-03-30, 63 y.o.   MRN: [id number]  HPI 63 y/o WM here for a follow up visit... he has multiple medical problems as noted below...    ~  December 26, 2011:  109mo ROV & Kurt Kidd wants to know which of his meds reacts w/ beer (usually drinks 1-2 on weekends, but had 6-7 one day at the beach & became jittery, nervous, shaking- last 2H & resolved spont, BS was 142); I told him to just stop drinking that much beer...    HBP> on Metop50Bid; BP= 130/72 & he denies CP, palpit, dizzy, SOB, edema, etc.    CAD> on VPX106; he is too sedentary but denies angina, etc; had f/u w/ Benay Spice 4/12> 2DEcho showed Ao sclerosis, no stenosis, mild MR, norm LVF; he noted rapid    HR after eating which improved by adjusting his eating habits; decr alcohol & caffeine...    Hyperlipidemia> on Lip80 now ?compliance; FLP shows TChol 220, TG 138, HDL 42, LDL 163; needs to take Lip80 DAILY & needs ZETIA10 added.    DM> on Metform500Bid; BS=110, A1c= wasn't run; we reviewed diet, exercise & wt reduction strategies...    Overweight> wt= 197# & he knows the importance of wt reduction...    GI> GERD, Divertics, Hx ischemic colitis> on Nexium40Bid from DrByers for reflux espoh; he denies abd pain, n/v, d/c, blood seen..    DJD> he uses OTC analgesics as needed... We reviewed prob list, meds, xrays and labs> see below for updates >> OK Flu shot today...  LABS 10/13:  FLP- not at goals on Lip80 w/ LDL=163;  Chems- ok...  ~  June 26, 2012:  32mo ROV & Kurt Kidd says he feels bad AFTER he eats & we discussed low carb/ low fat diet, smaller portions, etc;  Also c/o weak stream, dribbles, denies incont, bro-in-law dx w/ prostate ca- we discussed trial Flomax & check PSA=2.79;  He also had f/u colonoscopy by DrByrnett in [place] 2/14= wnl, neg, no lesions seen...     HBP> on Metop50Bid; BP= 124/82 & he denies CP, palpit, dizzy, SOB, edema, etc.    CAD> on YIR485; he is too sedentary but  denies angina, etc; had f/u w/ [facility] 4/12> 2DEcho showed incr wall thickness c/w severeLVH, norm LVF w/ EF=65-70%, mild MR, mild LAdil; he noted rapid HR after eating which improved by adjusting his eating habits, decr alcohol & caffeine...    Hyperlipidemia> on Lip80 ?compliance (NOT taking Zetia); FLP shows TChol 227, TG 136, HDL 43, LDL 173; needs to take Lip80 DAILY & needs ZETIA10 added but he declines due to $$...    DM> on Metform500Bid; BS=111, A1c= 6.1; we reviewed diet, exercise & wt reduction strategies...    Overweight> wt= 193# which is down 4#- keep up the good work...    GI- GERD, Divertics, Hx ischemic colitis> prev on NexiumBid from DrByers for reflux esoph but he stopped on his own & taking Omep20 instead; he denies abd pain, n/v, d/c, blood seen; his Bro had colon cancer- f/u colonoscopy in [facility] 2/14 was neg- wnl & f/u planned 47yrs...    GU- BPH> not currently on meds; c/o weak stream, dribbles, denies incont, bro-in-law dx w/ prostate ca- we discussed trial Flomax0.4/d & check PSA=2.79...    DJD> he uses OTC analgesics as needed... We reviewed prob list, meds, xrays and labs> see below for updates >>   LABS 4/14:  FLP-  not at goals on Lip80;  Chems- ok w/ BS=111, A1c=6.1;  CBC- wnl;  TSH=1.82;  PSA=2.79;  UA- clear...   ~  December 29, 2012:  72moROV & Kurt Kidd describes an episode where he was at the beach drinking w/ his brother then got SOB w/ palpit when supine at night- vague, poor hx, self limited, ?what to make of this; he notes that his sugars have been "up & down" &he's wondering about IAnastasio Auerbach(has a friend on this)... We reviewed the following medical problems during today's office visit >>     HBP> on Metop50Bid; BP= 132/70 & he denies recurrent CP, palpit, dizzy, SOB, edema, etc.    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    Hyperlipidemia> on Lip80 ?compliance (NOT taking Zetia); FLP 10/14 showed TChol 252, TG 115, HDL 48, LDL 196; needs to take Lip80 DAILY & needs ZETIA10 added but he declines due to $$...    DM> on Metform500Bid; labs 10/14 showed BS=115, A1c= 6.4; we reviewed diet, exercise & wt reduction strategies...    Overweight> wt= 192# which is essentially unchanged- we reviewed diet, exercise, wt reduction strategies...    GI- GERD, Divertics, Hx ischemic colitis> prev on NexiumBid from DrByers for reflux esoph but he stopped on his own & taking Omep20 OTC instead; he denies abd pain, n/v, d/c, blood seen; his [facility] colon [facility]- w[facility] & f/u planned 535yr(DrByrnett)...    GU- BPH> not currently on meds; he was prev on Flomax for LTOS but off now & denies urinary symptoms; bro-in-law dx w/ prostate ca- pt's PSA 4/14 = 2.79...    DJD> he uses OTC analgesics as needed... We reviewed prob list, meds, xrays and labs> see below for updates >> given 2014 Flu vaccine today...  LABS 10/14:  FLP- not at goals & Pharm confirms non-compliance w/ Lip80;  Chems- wnl w/ BS=115, A1c=6.4...  ~  Jul 07, 2013:  75m3moV & Kurt Kidd describes unusual symptom complex> about 1.5hrs after eating (every time he eats) he gets a discomfort in his left shoulder/ upper chest area, followed by increased heart rate/ palpit/ shakey and SOB; he's checked BS and they have been normal (no hypoglycemia) & it occurs after eating, not made better by eating; he states "if I don't eat I feel great"; he denies swallowing difficulty, reflux, heartburn, n/v, d/c, change in BMs etc; he admits to some stress w/ daugh  in jail but he doesn't think the symptoms are stress related; this has been going on 2-3 times daily (every time he eats) for ?[id number] symptoms of palpit, shaking, SOB lasts for about an hour & resolves spont- nothing he has tried has made it better; he thinks the symptoms are similoar to 2004 which lead to a stress test, then cath & stent; he has several cardiac risk factors as noted w/ poor med compliance... We discussed referral to Cards for their assessment (?repeat cath?) & in the interim we wrote for NTG  to try the next time he gets a similar episode...     HBP, severe LVH on 2DEcho> on Metop50Bid; BP= 140/72 & he describes the above unusual "spells" after eating (but notes that if he doesn't eat he feels great)...    CAD> on ASA325; he is active at work washing trucks and denies angina, etc; last saw Benay Spice 6/14> CAD w/ intervention in 2004; cath in 2008 showed mild stenosis at the stent site;  He noted unusual palpit & SOB after eating in 2012- further eval w/ 2DEcho 4/12 showed incr wall thickness c/w severeLVH, norm LVF w/ EF=65-70%, mild MR, mild LAdil; Event monitor was done (?) but no report avail;  Symptoms improved at that time by adjusting his eating habits, decr alcohol & caffeine...    Hyperlipidemia> on Lip80 ?compliance; FLP 5/15 showed TChol 207, TG 109, HDL 48, LDL 137; needs to take Lip80 DAILY, & rec Lipid Clinic (he's refused Zetia due to cost)...    DM> on Metform500Bid; labs 5/15 showed BS=122, last A1c was 6.4 (Oct2014); we reviewed diet, exercise & wt reduction strategies...    Overweight> wt= 189# which is essentially unchanged- we reviewed diet, exercise, wt reduction strategies...    GI- GERD, Divertics, Hx ischemic colitis> prev on NexiumBid from DrByers for reflux esoph but he switched to [place] OTC instead; denies abd pain, n/v, d/c, blood seen; his Bro had colon cancer- last colonoscopy in [facility] 2/14 was neg- wnl & f/u planned 62yrs (DrByrnett)...    GU- BPH> not  currently on meds; he was prev on Flomax for LTOS but off now & denies urinary symptoms; bro-in-law dx w/ prostate ca- pt's PSA 5/15 = 2.63...    DJD> he uses OTC analgesics as needed... We reviewed prob list, meds, xrays and labs> see below for updates >>   CXR 5/15 showed mild cardiomeg, atherosclerotic calcif in Ao, clear lungs, DJD spine...  LABS 5/15:  FLP- sl improved but still not at goals on Lip80 (?compliance);  Chems- ok w/ BS=122;  CBC- wnl;  TSH=1.84;  PSA=2.63...   ~  August 24, 2013:  6wk ROV & recheck> Kurt Kidd had a thorough cardiac eval by Benay Spice 5/15 w/ 24h urine for catecholamines & metanephrines (neg);  2DEcho (modLVH, norm LVF w/ EF=60-65%, norm wall motion, Gr2DD, modMR, severe LAdilatation);  Myoview (small area of mod severity reversible defect in apic infer & mid inferolat walls, no ST segment changes);  They discussed the findings and pt had decided to STOP all his meds as a trial- initially he thought that stopping the Metformin eliminated his symptom complex but subseq he had another bought of pain/ palpit/ SOB while he was off of all meds for 2 weeks; he has since restarted his medications (ASA81, Metop50Bid, Atorva80, Nexium20, Flomax0.4)) but kept the Metformin at 1/2 tab Bid... As the symptom is persisting- sounds like he may need further eval & we will discuss w/ Cards...    He is also c/o new onset LBP w/ some radiation to the right leg assoc w/ pos SLR on that side; denies trauma, no prev hx of back problems, etc; looks like he will need MRI so we will [facility] for [facility] & MRI if deemed indicated; in the meanwhile we will Rx w/ rest, heat, back brace, & ROBAXIN 500mg Tid...          Problem List:    BRONCHITIS, ACUTE (ICD-466.0) - he is an ex-smoker, quit ~68yrs... no recent problems and the exercise program has really  helped... ~  CXR 4/13 showed heart at upper lim normal, clear lungs, NAD.Marland Kitchen. ~  CXR 5/15 showed mild cardiomeg, atherosclerotic calcif in Ao,  clear lungs, DJD spine.  HYPERTENSION (ICD-401.9) - on METOPROLOL 50mg Bid... 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 ~  2DEcho 3/12 showed incr wall thickness c/w severeLVH, norm LVF w/ EF=65-70%, mild MR, mild LAdil... ~  4/13:  BP= 148/80 & he remains largely asymptomatic; CXR 4/13 showed heart size at upper lim norm, clear lungs, NAD...  ~  10/13:  BP= 130/72 & he continues to deny CP, palpt, SOB, edema, etc... ~  4/14:  on Metop50Bid; BP= 124/82 & he denies CP, palpit, dizzy, SOB, edema, etc... ~  10/14: on Metop50Bid; BP= 132/70 & he denies recurrent CP, palpit, dizzy, SOB, edema, etc. ~  5/15: Hx HBP & LVH on 2DEcho> on Metop50Bid; BP= 140/72... ~  6/15: on Metoprolol50Bid and BP= 140/66  CORONARY ARTERY DISEASE (ICD-414.00) - on ASA 325mg /d... followed by Benay Spice & his notes are reviewed... he has done better recently w/ secondary risk factor reduction... ~  NuclearStressTest 7/04 showed ?anteroseptal scar vs thinning, no ischemia, EF=61%... ~  initial cath 7/04 w/ 80% LAD lesion... stent placed by DrStuckey... ~  last cath 8/08 showed non-obstructive CAD & prev stent site w/ <10% narrowing, EF=65%... ~  he continues asymptomatic w/o CP, palpit, SOB, edema, etc => still w/ mult risk factors & hi risk pt therefore rec f/u w/ Cards for f/u screening... ~  He saw Benay Spice 4/12> 2DEcho showed incr wall thickness c/w severeLVH, norm LVF w/ EF=65-70%, mild MR, mild LAdil.;  He noted rapid HR after eating which improved by adjusting his eating habits, decr alcohol & caffeine...  ~  He had f/u w/ DrKatz 6/14> CAD w/ intervention in 2004; cath in 2008 showed mild stenosis at the stent site;  He noted unusual palpit & SOB after eating in 2012- further eval w/ 2DEcho 3/12 showed incr wall thickness c/w severeLVH, norm LVF w/ EF=65-70%, mild MR, mild LAdil;  Event monitor was done (?) but no report avail;  Symptoms improved by adjusting his eating habits, decr alcohol & caffeine... ~  EKG 6/14 showed NSR, rate73, LVH w/ repol abn... ~  5/15:  SEE ABOVE DESCRIPTION => referred to Cards for further eval=> he saw [facility] 5/15 w/ 24h urine for catecholamines & metanephrines (neg);  2DEcho (modLVH, norm LVF w/ EF=60-65%, norm wall motion, Gr2DD, modMR, severe LAdilatation);  Myoview (small area of mod severity reversible defect in apic infer & mid inferolat walls, no ST segment changes);  They discussed the findings and pt had decided to STOP all his meds as a trial- initially he thought that stopping the Metformin eliminated his symptom complex but subseq he had another bought of pain/ palpit/ SOB while he was off of all meds for 2 weeks; he has since restarted his medications (ASA81, Metop50Bid, Atorva80, Nexium20, Flomax0.4)) but kept the Metformin at 1/2 tab Bid... As the symptom is persisting- sounds like he may need further eval & we will discuss w/ Cards...  HYPERLIPIDEMIA (ICD-272.4) - on LIPITOR 80mg /d now... he stopped his prev Fish Oil supplements ("they gave me gas")...  ~  [place] 4/07 on diet showed TChol 271, TG 443, HDL 31, LDL 155... Rec~ restart ZOCOR 80... ~  [place] 3/09 on Simva80 showed TChol 181, TG 166, HDL  36, LDL 112... same med & get wt down (then lost 14#) ~  [place] 8/09 on Simva80 showed TChol 186, TG 254, HDL 37, LDL 127... rec- same med, better diet, may need Fibrate etc. ~  FLP 1/10 on Simva80 showed TChol 190, TG 118, HDL 39, LDL 127... rec> get wt down! ~  FLP 7/10 on Simva80 showed TChol 179, TG 121, HDL 48, LDL 107 ~  11/11: not fasting for labs today & we decided to switch to LIPITOR 40mg /d. ~  FLP 4/13 ?on Lip40 showed TChol 230, TG 116, HDL 50, LDL 165... rec to take daily & incr to Lip80. ~  FLP 10/13 on Lip80 showed TChol 220, TG 138, HDL , LDL 163...  Known CAD w/ stent, Add ZETIA10 => he never refilled this Rx due to $$... ~  FLP  4/14 on Lip80 showed TChol 227, TG 136, HDL 43, LDL 173... Reminded to take med regularly... ~  FLP 10/14 on Lip80 showed TChol 252, TG 115, HDL 48, LDL 196... Pharm confirms med noncompliance even though pt insists he's taking it regularly w/ these bad numbers... ~  FLP 5/15 on Lip80 showed TChol 207, TG 109, HDL 48, LDL 137... We discussed lipid clinic referral...  DIABETES MELLITUS, BORDERLINE (ICD-790.29) - started meds 3/09 w/ METFORMIN 500- now on 1Bid. ~  labs 4/07 showed FBS=134, HgA1c=6.0.Marland KitchenMarland Kitchen ~  labs 3/09 showed BS= 140, HgA1c= 7.0.Marland KitchenMarland Kitchen Metformin started ~  labs 8/09 on Metform500Bid showed BS= 147, HgA1c= 6.5 ~  labs 1/10 on Metform500Bid showed BS= 144, A1c= 6.4.Marland Kitchen. rec> keep same. ~  labs 7/10 on Metform500Bid showed BS= 126, A1c= 6.2 ~  labs 11/11 on Metform500Bid showed BS= 108, A1c= 6.3 ~  Labs 4/13 on Metform500Bid showed BS= 107, A1c= 6.3 ~  Labs 10/13 on Metform500Bid showed BS= 110, A1c= not done. ~  Labs 4/14 on Metform500Bid showed BS= 111, A1c= 6.1 ~  Labs 10/14 on Metform500Bid showed BS=115, A1c=6.4 ~  Labs 5/15 on Metform500Bid showed BS= 122  OVERWEIGHT (ICD-278.02) - started at 212# Mar09... ~  8/09:  great job w/ diet and exercise program and weight down 14# to 198# ~  1/10:  unfortunately weight back up to 208# after the holidays. ~  7/10: weight down to 198# ~  11/11: weight = 202# ~  4/13: Weight = 199# ~  10/13: Weight = 197# ~  4/14: Weight = 193# ~  10/14:  Weight = 192# ~  5/15:  Weight = 189#  Hx of GERD (ICD-530.81) - hx gastritis on EGD by [facility] 8/04... he takes [place] 40mg Bid & denies dysphagia, abd pain, GERD, n/ v/ etc... ~  Abd Sonar 7/10 showed norm GB, norm ducts, incr liver echotexture c/w fatty liver dis, otherw norm- NAD.Marland Kitchen. ~  5/15: he switched to Nexium20 OTC prn...  DIVERTICULOSIS OF COLON (ICD-562.10) - colonoscopy 9/06 by Demetra Shiner showed ischemic colitis- he needs a full diagnostic colonoscopy 63 y/o Bro had colon cancer... ~  f/u  colonoscopy 9/09 by DrPatterson showed divertics, polyp= polypoid mucosa w/o adenomatous change... f/u planned 64yrs. ~  f/u colonoscopy in [facility] 2/14 was neg- wnl & f/u planned 56yrs (DrByrnett)...  Hx of ISCHEMIC COLITIS (ICD-557.9) - hospitalized 9/06 w/ lower GIB secondary to ischemic colitis (required 2u transfusion)...   DEGENERATIVE JOINT DISEASE (ICD-715.90) & ?CTS w/ wrist pain> wrist splint Rx Qhs helps... LOW BACK PAIN >> new symptom 6/15, no obvious trauma etc; he had pos SLR on right & is referred to Ortho + Rx  w/ rest, hear, brace, Robaxin Tid prn...  ONYCHOMYCOSIS (ICD-110.1) - Mar09 notes nail fungus on left hand and both feet... LAMISIL 250mg /d started... hands resolved now and toes are improved.   Past Surgical History  Procedure Laterality Date  . Angioplasty / stenting femoral  09/09/2002    Outpatient Encounter Prescriptions as of 08/24/2013  Medication Sig  . ALPRAZolam (XANAX) 0.5 MG tablet TAKE 1/2 TO 1 TABLET BY MOUTH THREE TIMES DAILY AS DIRECTED  . aspirin 81 MG EC tablet Take 1 tablet (81 mg total) by mouth daily.  Marland Kitchen atorvastatin (LIPITOR) 80 MG tablet TAKE ONE TABLET BY MOUTH EVERY DAY  . esomeprazole (NEXIUM) 20 MG capsule Take 20 mg by mouth daily before breakfast.  . metFORMIN (GLUCOPHAGE) 500 MG tablet TAKE ONE TABLET BY MOUTH TWICE DAILY WITH MEALS  . metoprolol (LOPRESSOR) 50 MG tablet TAKE ONE TABLET BY MOUTH TWICE DAILY  . nitroGLYCERIN (NITROSTAT) 0.4 MG SL tablet Place 1 tablet (0.4 mg total) under the tongue every 5 (five) minutes as needed.  . ONE TOUCH ULTRA TEST test strip   . tamsulosin (FLOMAX) 0.4 MG CAPS capsule Take 0.4 mg by mouth daily after supper.    No Known Allergies   Current Medications, Allergies, Past Medical History, Past Surgical History, Family History, and Social History were reviewed in [employer] record.   Review of Systems    The patient denies fever, chills, sweats, anorexia, fatigue,  weakness, malaise, weight loss, sleep disorder, blurring, diplopia, eye irritation, eye discharge, vision loss, eye pain, photophobia, earache, ear discharge, tinnitus, decreased hearing, nasal congestion, nosebleeds, sore throat, hoarseness, chest pain, palpitations, syncope, dyspnea on exertion, orthopnea, PND, peripheral edema, cough, dyspnea at rest, excessive sputum, hemoptysis, wheezing, pleurisy, nausea, vomiting, diarrhea, constipation, change in bowel habits, abdominal pain, melena, hematochezia, jaundice, gas/bloating, indigestion/heartburn, dysphagia, odynophagia, dysuria, hematuria, urinary frequency, urinary hesitancy, nocturia, incontinence, back pain, joint pain, joint swelling, muscle cramps, muscle weakness, stiffness, arthritis, sciatica, restless legs, leg pain at night, leg pain with exertion, rash, itching, dryness, suspicious lesions, paralysis, paresthesias, seizures, tremors, vertigo, transient blindness, frequent falls, frequent headaches, difficulty walking, depression, anxiety, memory loss, confusion, cold intolerance, heat intolerance, polydipsia, polyphagia, polyuria, unusual weight change, abnormal bruising, bleeding, enlarged lymph nodes, urticaria, allergic rash, hay fever, and recurrent infections.     Objective:   Physical Exam     WD, Overweight, 63 y/o WM in NAD...  GENERAL:  Alert & oriented; pleasant & cooperative... HEENT:  [place]/AT, EOM-wnl, PERRLA, EACs-clear, TMs-wnl, NOSE-clear, THROAT-clear & wnl. NECK:  Supple w/ fairROM; no JVD; normal carotid impulses w/o bruits; no thyromegaly or nodules palpated; no lymphadenopathy. CHEST:  Clear to P & A; without wheezes/ rales/ or rhonchi. HEART:  Regular Rhythm;  gr 1/6 SEM without rubs or gallops detected... ABDOMEN:  Soft & min tender in RUQ; normal bowel sounds; no organomegaly or masses palpated... EXT: without deformities, mild arthritic changes; no varicose veins/ venous insuffic/ or edema. NEURO:  CN's intact;  no  focal neuro deficits... DERM:  onychomycosis improved as noted...  RADIOLOGY DATA:  Reviewed in the EPIC EMR & discussed w/ the patient...  LABORATORY DATA:  Reviewed in the EPIC EMR & discussed w/ the patient...   Assessment:      HBP & severe LVH>  BP seems well controlled on BBlocker monotherapy; we reviewed no salt, & wt reduction...   CAD> on ASA81 & Metoprolol50Bid; he has unusual symptom complex that is persisting even off meds (initially he thought stopping Metformin  eliminated his pain etc); will discuss w/ Cards regarding poss cath...  Hyperlipidemia> on Lip80; FLP shows improved LDL=137 but still not near goal of <70; offered lipid clinic but he declined; ? If taking med regularly & we discussed this...  DM> on Metform500-1/2Bid now; BS was122, A1c was 6.4 on 500Bid; we reviewed diet, exercise & wt reduction strategies...  Overweight> wt= 191# & he knows the importance of wt reduction...  GI> GERD, Divertics, Hx ischemic colitis> on Omep20 for reflux esoph; continue same.  DJD> he uses OTC analgesics as needed...     Plan:      Patient's Medications  New Prescriptions   METHOCARBAMOL (ROBAXIN) 500 MG TABLET    Take 1 tablet (500 mg total) by mouth 3 (three) times daily as needed for muscle spasms.  Previous Medications   ALPRAZOLAM (XANAX) 0.5 MG TABLET    TAKE 1/2 TO 1 TABLET BY MOUTH THREE TIMES DAILY AS DIRECTED   ASPIRIN 81 MG EC TABLET    Take 1 tablet (81 mg total) by mouth daily.   ATORVASTATIN (LIPITOR) 80 MG TABLET    TAKE ONE TABLET BY MOUTH EVERY DAY   ESOMEPRAZOLE (NEXIUM) 20 MG CAPSULE    Take 20 mg by mouth daily before breakfast.   METOPROLOL (LOPRESSOR) 50 MG TABLET    TAKE ONE TABLET BY MOUTH TWICE DAILY   NITROGLYCERIN (NITROSTAT) 0.4 MG SL TABLET    Place 1 tablet (0.4 mg total) under the tongue every 5 (five) minutes as needed.   ONE TOUCH ULTRA TEST TEST STRIP       TAMSULOSIN (FLOMAX) 0.4 MG CAPS CAPSULE    Take 0.4 mg by mouth daily after  supper.  Modified Medications   Modified Medication Previous Medication   METFORMIN (GLUCOPHAGE) 500 MG TABLET metFORMIN (GLUCOPHAGE) 500 MG tablet      take 1/2 tablet by mouth two times daily.    TAKE ONE TABLET BY MOUTH TWICE DAILY WITH MEALS  Discontinued Medications   No medications on file

## 2013-08-24 NOTE — Patient Instructions (Signed)
Today we updated your med list in our EPIC system...    Continue your current medications the same...  I will send a memo to [facility] about the return of your symptoms off of the meds (which you have since restarted)...  For your back pain>>    Rest the back- no heavy lifting etc...    Apply heart- hot tub/ shower/ towels/ heating pad etc...    Use the ROBAXIN 500mg  up to 3 times daily as needed    You may also take Aleve or Advil w/ some Tylenol for the pain...  We will set up a referral to an orthopedist for XRays and prob MRI of your lower spine...  Call for any questions...  Let's plan a follow up visit in 51mo, to recheck your sugar levels on the lower dose of Metformin.Marland KitchenMarland Kitchen

## 2013-09-06 ENCOUNTER — Other Ambulatory Visit: Payer: Self-pay | Admitting: Pulmonary Disease

## 2013-10-07 ENCOUNTER — Ambulatory Visit: Payer: BC Managed Care – PPO | Admitting: Cardiology

## 2013-11-23 ENCOUNTER — Ambulatory Visit (INDEPENDENT_AMBULATORY_CARE_PROVIDER_SITE_OTHER): Payer: Medicare Other | Admitting: Pulmonary Disease

## 2013-11-23 ENCOUNTER — Encounter: Payer: Self-pay | Admitting: Pulmonary Disease

## 2013-11-23 VITALS — BP 128/68 | HR 60 | Temp 97.8°F | Ht 66.0 in | Wt 191.6 lb

## 2013-11-23 DIAGNOSIS — I10 Essential (primary) hypertension: Secondary | ICD-10-CM

## 2013-11-23 DIAGNOSIS — I059 Rheumatic mitral valve disease, unspecified: Secondary | ICD-10-CM | POA: Diagnosis not present

## 2013-11-23 DIAGNOSIS — Z23 Encounter for immunization: Secondary | ICD-10-CM

## 2013-11-23 DIAGNOSIS — I2510 Atherosclerotic heart disease of native coronary artery without angina pectoris: Secondary | ICD-10-CM | POA: Diagnosis not present

## 2013-11-23 DIAGNOSIS — I340 Nonrheumatic mitral (valve) insufficiency: Secondary | ICD-10-CM

## 2013-11-23 DIAGNOSIS — E785 Hyperlipidemia, unspecified: Secondary | ICD-10-CM

## 2013-11-23 DIAGNOSIS — E119 Type 2 diabetes mellitus without complications: Secondary | ICD-10-CM

## 2013-11-23 DIAGNOSIS — M1990 Unspecified osteoarthritis, unspecified site: Secondary | ICD-10-CM

## 2013-11-23 DIAGNOSIS — N400 Enlarged prostate without lower urinary tract symptoms: Secondary | ICD-10-CM

## 2013-11-23 DIAGNOSIS — M545 Low back pain, unspecified: Secondary | ICD-10-CM

## 2013-11-23 MED ORDER — METOPROLOL TARTRATE 50 MG PO TABS: ORAL_TABLET | ORAL | Status: DC

## 2013-11-23 NOTE — Patient Instructions (Signed)
Today we updated your med list in our EPIC system...    Continue your current medications the same...  We gave you the 2015 Flu vaccine today...  Call for any questions...  Let's plan a follow up visit in 4-65mo, sooner if needed for problems.Marland KitchenMarland Kitchen

## 2013-11-23 NOTE — Progress Notes (Signed)
Subjective:     Patient ID: Kurt Kidd, male   DOB: 25-Sep-1950, 63 y.o.   MRN: [id number]  HPI 63 y/o WM here for a follow up visit... he has multiple medical problems as noted below...    ~  December 26, 2011:  36moROV & Kurt Kidd wants to know which of his meds reacts w/ beer (usually drinks 1-2 on weekends, but had 6-7 one day at the beach & became jittery, nervous, shaking- last 2H & resolved spont, BS was 142); I told him to just stop drinking that much beer...    HBP> on Metop50Bid; BP= 130/72 & he denies CP, palpit, dizzy, SOB, edema, etc.    CAD> on ATIW580 he is too sedentary but denies angina, etc; had f/u w/ DBenay Spice4/12> 2DEcho showed Ao sclerosis, no stenosis, mild MR, norm LVF; he noted rapid    HR after eating which improved by adjusting his eating habits; decr alcohol & caffeine...    Hyperlipidemia> on Lip80 now ?compliance; FLP shows TChol 220, TG 138, HDL 42, LDL 163; needs to take Lip80 DAILY & needs ZETIA10 added.    DM> on Metform500Bid; BS=110, A1c= wasn't run; we reviewed diet, exercise & wt reduction strategies...    Overweight> wt= 197# & he knows the importance of wt reduction...    GI> GERD, Divertics, Hx ischemic colitis> on Nexium40Bid from DrByers for reflux espoh; he denies abd pain, n/v, d/c, blood seen..    DJD> he uses OTC analgesics as needed... We reviewed prob list, meds, xrays and labs> see below for updates >> OK Flu shot today...  LABS 10/13:  FLP- not at goals on Lip80 w/ LDL=163;  Chems- ok...  ~  June 26, 2012:  610moOV & Kurt Kidd says he feels bad AFTER he eats & we discussed low carb/ low fat diet, smaller portions, etc;  Also c/o weak stream, dribbles, denies incont, bro-in-law dx w/ prostate ca- we discussed trial Flomax & check PSA=2.79;  He also [place]= w[place], neg, no lesions seen...     HBP> on Metop50Bid; BP= 124/82 & he denies CP, palpit, dizzy, SOB, edema, etc.    CAD> on ASDXI338he is too sedentary but  denies angina, etc; [facility]> 2DEcho showed incr wall thickness c/w severeLVH, norm LVF w/ EF=65-70%, mild MR, mild LAdil; he noted rapid HR after eating which improved by adjusting his eating habits, decr alcohol & caffeine...    Hyperlipidemia> on Lip80 ?compliance (NOT taking Zetia); FLP shows TChol 227, TG 136, HDL 43, LDL 173; needs to take Lip80 DAILY & needs ZETIA10 added but he declines due to $$...    DM> on Metform500Bid; BS=111, A1c= 6.1; we reviewed diet, exercise & wt reduction strategies...    Overweight> wt= 193# which is down 4#- keep up the good work...    GI- GERD, Divertics, Hx ischemic colitis> prev on NexiumBid from DrByers for reflux esoph but he stopped on his own & taking Omep20 instead; he denies abd pain, n/v, d/c, blood seen; his Bro had colon cancer- [facility]- wnl & f/u planned 5y81yr.    GU- BPH> not currently on meds; c/o weak stream, dribbles, denies incont, bro-in-law dx w/ prostate ca- we discussed trial Flomax0.4/d & check PSA=2.79...    DJD> he uses OTC analgesics as needed... We reviewed prob list, meds, xrays and labs> see below for updates >>   LABS 4/14:  FLP-  not at goals on Lip80;  Chems- ok w/ BS=111, A1c=6.1;  CBC- wnl;  TSH=1.82;  PSA=2.79;  UA- clear...   ~  December 29, 2012:  72moROV & Kurt Kidd describes an episode where he was at the beach drinking w/ his brother then got SOB w/ palpit when supine at night- vague, poor hx, self limited, ?what to make of this; he notes that his sugars have been "up & down" &he's wondering about IAnastasio Auerbach(has a friend on this)... We reviewed the following medical problems during today's office visit >>     HBP> on Metop50Bid; BP= 132/70 & he denies recurrent CP, palpit, dizzy, SOB, edema, etc.    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    Hyperlipidemia> on Lip80 ?compliance (NOT taking Zetia); FLP 10/14 showed TChol 252, TG 115, HDL 48, LDL 196; needs to take Lip80 DAILY & needs ZETIA10 added but he declines due to $$...    DM> on Metform500Bid; labs 10/14 showed BS=115, A1c= 6.4; we reviewed diet, exercise & wt reduction strategies...    Overweight> wt= 192# which is essentially unchanged- we reviewed diet, exercise, wt reduction strategies...    GI- GERD, Divertics, Hx ischemic colitis> prev on NexiumBid from DrByers for reflux esoph but he stopped on his own & taking Omep20 OTC instead; he denies abd pain, n/v, d/c, blood seen; his [facility] had colon cancer[facility]- wnl & f/u planned 535yr(DrByrnett)...    GU- BPH> not currently on meds; he was prev on Flomax for LTOS but off now & denies urinary symptoms; bro-in-law dx w/ prostate ca- pt's PSA 4/14 = 2.79...    DJD> he uses OTC analgesics as needed... We reviewed prob list, meds, xrays and labs> see below for updates >> given 2014 Flu vaccine today...  LABS 10/14:  FLP- not at goals & Pharm confirms non-compliance w/ Lip80;  Chems- wnl w/ BS=115, A1c=6.4...  ~  Jul 07, 2013:  75m3moV & Kurt Kidd describes unusual symptom complex> about 1.5hrs after eating (every time he eats) he gets a discomfort in his left shoulder/ upper chest area, followed by increased heart rate/ palpit/ shakey and SOB; he's checked BS and they have been normal (no hypoglycemia) & it occurs after eating, not made better by eating; he states "if I don't eat I feel great"; he denies swallowing difficulty, reflux, heartburn, n/v, d/c, change in BMs etc; he admits to some stress w/ daugh  in jail but he doesn't think the symptoms are stress related; this has been going on 2-3 times daily (every time he eats) for ?[id number] symptoms of palpit, shaking, SOB lasts for about an hour & resolves spont- nothing he has tried has made it better; he thinks the symptoms are similoar to 2004 which lead to a stress test, then cath & stent; he has several cardiac risk factors as noted w/ poor med compliance... We discussed referral to Cards for their assessment (?repeat cath?) & in the interim we wrote for NTG  to try the next time he gets a similar episode...     HBP, severe LVH on 2DEcho> on Metop50Bid; BP= 140/72 & he describes the above unusual "spells" after eating (but notes that if he doesn't eat he feels great)...    CAD> on ASA325; he is active at work washing trucks and denies angina, etc; last saw Benay Spice 6/14> CAD w/ intervention in 2004; cath in 2008 showed mild stenosis at the stent site;  He noted unusual palpit & SOB after eating in 2012- further eval w/ 2DEcho 4/12 showed incr wall thickness c/w severeLVH, norm LVF w/ EF=65-70%, mild MR, mild LAdil; Event monitor was done (?) but no report avail;  Symptoms improved at that time by adjusting his eating habits, decr alcohol & caffeine...    Hyperlipidemia> on Lip80 ?compliance; FLP 5/15 showed TChol 207, TG 109, HDL 48, LDL 137; needs to take Lip80 DAILY, & rec Lipid Clinic (he's refused Zetia due to cost)...    DM> on Metform500Bid; labs 5/15 showed BS=122, last A1c was 6.4 (Oct2014); we reviewed diet, exercise & wt reduction strategies...    Overweight> wt= 189# which is essentially unchanged- we reviewed diet, exercise, wt reduction strategies...    GI- GERD, Divertics, Hx ischemic colitis> prev on NexiumBid from DrByers for reflux esoph but he switched to [place] OTC instead; denies abd pain, n/v, d/c, blood seen; his Bro had colon cancer- last colonoscopy in [facility]/14 was neg- wnl & f/u planned 59yrs (DrByrnett)...    GU- BPH> not  currently on meds; he was prev on Flomax for LTOS but off now & denies urinary symptoms; bro-in-law dx w/ prostate ca- pt's PSA 5/15 = 2.63...    DJD> he uses OTC analgesics as needed... We reviewed prob list, meds, xrays and labs> see below for updates >>   CXR 5/15 showed mild cardiomeg, atherosclerotic calcif in Ao, clear lungs, DJD spine...  LABS 5/15:  FLP- sl improved but still not at goals on Lip80 (?compliance);  Chems- ok w/ BS=122;  CBC- wnl;  TSH=1.84;  PSA=2.63...   ~  August 24, 2013:  6wk ROV & recheck> [name] had a thorough cardiac eval by Benay Spice 5/15 w/ 24h urine for catecholamines & metanephrines (neg);  2DEcho (modLVH, norm LVF w/ EF=60-65%, norm wall motion, Gr2DD, modMR, severe LAdilatation);  Myoview (small area of mod severity reversible defect in apic infer & mid inferolat walls, no ST segment changes);  They discussed the findings and pt had decided to STOP all his meds as a trial- initially he thought that stopping the Metformin eliminated his symptom complex but subseq he had another bought of pain/ palpit/ SOB while he was off of all meds for 2 weeks; he has since restarted his medications (ASA81, Metop50Bid, Atorva80, Nexium20, Flomax0.4)) but kept the Metformin at 1/2 tab Bid... As the symptom is persisting- sounds like he may need further eval & will discuss w/ Cards...    He is also c/o new onset LBP w/ some radiation to the right leg assoc w/ pos SLR on that side; denies trauma, no prev hx of back problems, etc; looks like he will need MRI so we will refer to [facility] for XRays & MRI if deemed indicated; in the meanwhile we will Rx w/ rest, heat, back brace, & ROBAXIN 500mg Tid...  ~  November 23, 2013:  39mo ROV & Chimaobi states that his chest discomfort has resolved and not recurred, he remains very active at work, exercising, "I work my butt off" he  says;  He denies CP, palpit, SOB, etc;  He remains on Metoprolol50bid & BP= 128/68 today;  He continues on Lip80 and states  that he is now taking it regularly- prev FLPs reflected his intermittent dosing, he is not fasting this AM;  His weight is stable at 191# (BMI=30-31) and he is taking his Metformin500bid more regularly as well... We plan follow up labs after the 1st of the yr & reminded to take all meds every day...     We reviewed prob list, meds, xrays and labs> see below for updates >> OK 2015 Flu vaccine today...           Problem List:    BRONCHITIS, ACUTE (ICD-466.0) - he is an ex-smoker, quit ~60yrs... no recent problems and the exercise program has really helped... ~  CXR 4/13 showed heart at upper lim normal, clear lungs, NAD.Marland Kitchen. ~  CXR 5/15 showed mild cardiomeg, atherosclerotic calcif in Ao, clear lungs, DJD spine.  HYPERTENSION (ICD-401.9) - on METOPROLOL 50mg Bid... 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 ~  2DEcho 3/12 showed incr wall thickness c/w severeLVH, norm LVF w/ EF=65-70%, mild MR, mild LAdil... ~  4/13:  BP= 148/80 & he remains largely asymptomatic; CXR 4/13 showed heart size at upper lim norm, clear lungs, NAD...  ~  10/13:  BP= 130/72 & he continues to deny CP, palpt, SOB, edema, etc... ~  4/14:  on Metop50Bid; BP= 124/82 & he denies CP, palpit, dizzy, SOB, edema, etc... ~  10/14: on Metop50Bid; BP= 132/70 & he denies recurrent CP, palpit, dizzy, SOB, edema, etc. ~  5/15: Hx HBP & LVH on 2DEcho> on Metop50Bid; BP= 140/72... ~  6/15: on Metoprolol50Bid and BP= 140/66 ~  9/15: on Metop50Bid; BP= 128/68 and he denies CP, palpit, SOB, edema, etc...   CORONARY ARTERY DISEASE (ICD-414.00) - on ASA 325mg /d... followed by Benay Spice & his notes are reviewed... he has done better recently w/ secondary risk factor reduction... ~  NuclearStressTest 7/04 showed ?anteroseptal scar vs thinning, no ischemia, EF=61%... ~  initial cath 7/04 w/ 80% LAD  lesion... stent placed by DrStuckey... ~  last cath 8/08 showed non-obstructive CAD & prev stent site w/ <10% narrowing, EF=65%... ~  he continues asymptomatic w/o CP, palpit, SOB, edema, etc => still w/ mult risk factors & hi risk pt therefore rec f/u w/ Cards for f/u screening... ~  He saw Benay Spice 4/12> 2DEcho showed incr wall thickness c/w severeLVH, norm LVF w/ EF=65-70%, mild MR, mild LAdil.;  He noted rapid HR after eating which improved by adjusting his eating habits, decr alcohol & caffeine...  ~  He had f/u w/ DrKatz 6/14> CAD w/ intervention in 2004; cath in 2008 showed mild stenosis at the stent site;  He noted unusual palpit & SOB after eating in 2012- further eval w/ 2DEcho 3/12 showed incr wall thickness c/w severeLVH, norm LVF w/ EF=65-70%, mild MR, mild LAdil; Event monitor was done (?) but no report avail;  Symptoms improved by adjusting his eating habits, decr alcohol & caffeine... ~  EKG 6/14 showed NSR, rate73, LVH w/ repol abn... ~  5/15:  SEE ABOVE DESCRIPTION => referred to Cards for further eval=> he [facility] w/ 24h urine for catecholamines & metanephrines (neg);  2DEcho (modLVH, norm LVF w/ EF=60-65%, norm wall motion, Gr2DD, modMR, severe LAdilatation);  Myoview (small area  of mod severity reversible defect in apic infer & mid inferolat walls, no ST segment changes);  They discussed the findings and pt had decided to STOP all his meds as a trial- initially he thought that stopping the Metformin eliminated his symptom complex but subseq he had another bought of pain/ palpit/ SOB while he was off of all meds for 2 weeks; he has since restarted his medications (ASA81, Metop50Bid, Atorva80, Nexium20, Flomax0.4)) but kept the Metformin at 1/2 tab Bid... As the symptom is persisting- sounds like he may need further eval & will discuss w/ Cards...  HYPERLIPIDEMIA (ICD-272.4) - on LIPITOR 80mg /d now... he stopped his prev Fish Oil supplements ("they gave me gas")...  ~  [place] 4/07 on  diet showed TChol 271, TG 443, HDL 31, LDL 155... Rec~ restart ZOCOR 80... ~  [place] 3/09 on Simva80 showed TChol 181, TG 166, HDL 36, LDL 112... same med & get wt down (then lost 14#) ~  [place] 8/09 on Simva80 showed TChol 186, TG 254, HDL 37, LDL 127... rec- same med, better diet, may need Fibrate etc. ~  FLP 1/10 on Simva80 showed TChol 190, TG 118, HDL 39, LDL 127... rec> get wt down! ~  FLP 7/10 on Simva80 showed TChol 179, TG 121, HDL 48, LDL 107 ~  11/11: not fasting for labs today & we decided to switch to LIPITOR 40mg /d. ~  FLP 4/13 ?on Lip40 showed TChol 230, TG 116, HDL 50, LDL 165... rec to take daily & incr to Lip80. ~  FLP 10/13 on Lip80 showed TChol 220, TG 138, HDL , LDL 163...  Known CAD w/ stent, Add ZETIA10 => he never refilled this Rx due to $$... ~  FLP 4/14 on Lip80 showed TChol 227, TG 136, HDL 43, LDL 173... Reminded to take med regularly... ~  FLP 10/14 on Lip80 showed TChol 252, TG 115, HDL 48, LDL 196... Pharm confirms med noncompliance even though pt insists he's taking it regularly w/ these bad numbers... ~  FLP 5/15 on Lip80 showed TChol 207, TG 109, HDL 48, LDL 137... We discussed lipid clinic referral...  DIABETES MELLITUS, BORDERLINE (ICD-790.29) - started meds 3/09 w/ METFORMIN 500- now on 1Bid. ~  labs 4/07 showed FBS=134, HgA1c=6.0.Marland KitchenMarland Kitchen ~  labs 3/09 showed BS= 140, HgA1c= 7.0.Marland KitchenMarland Kitchen Metformin started ~  labs 8/09 on Metform500Bid showed BS= 147, HgA1c= 6.5 ~  labs 1/10 on Metform500Bid showed BS= 144, A1c= 6.4.Marland Kitchen. rec> keep same. ~  labs 7/10 on Metform500Bid showed BS= 126, A1c= 6.2 ~  labs 11/11 on Metform500Bid showed BS= 108, A1c= 6.3 ~  Labs 4/13 on Metform500Bid showed BS= 107, A1c= 6.3 ~  Labs 10/13 on Metform500Bid showed BS= 110, A1c= not done. ~  Labs 4/14 on Metform500Bid showed BS= 111, A1c= 6.1 ~  Labs 10/14 on Metform500Bid showed BS=115, A1c=6.4 ~  Labs 5/15 on Metform500Bid showed BS= 122  OVERWEIGHT (ICD-278.02) - started at 212# Mar09... ~  8/09:   great job w/ diet and exercise program and weight down 14# to 198# ~  1/10:  unfortunately weight back up to 208# after the holidays. ~  7/10: weight down to 198# ~  11/11: weight = 202# ~  4/13: Weight = 199# ~  10/13: Weight = 197# ~  4/14: Weight = 193# ~  10/14:  Weight = 192# ~  5/15:  Weight = 189#  Hx of GERD (ICD-530.81) - hx gastritis on EGD by [facility] 8/04... he takes [place] 40mg Bid & denies dysphagia, abd  pain, GERD, n/ v/ etc... ~  Abd Sonar 7/10 showed norm GB, norm ducts, incr liver echotexture c/w fatty liver dis, otherw norm- NAD.Marland Kitchen. ~  5/15: he switched to Nexium20 OTC prn...  DIVERTICULOSIS OF COLON (ICD-562.10) - colonoscopy 9/06 by Demetra Shiner showed ischemic colitis- he needs a full diagnostic colonoscopy 63 y/o Bro had colon cancer... ~  f/u colonoscopy 9/09 by DrPatterson showed divertics, polyp= polypoid mucosa w/o adenomatous change... f/u planned 21yrs. ~  [facility] 2/14 was neg- wnl & f/u planned 68yrs (DrByrnett)...  Hx of ISCHEMIC COLITIS (ICD-557.9) - hospitalized 9/06 w/ lower GIB secondary to ischemic colitis (required 2u transfusion)...   DEGENERATIVE JOINT DISEASE (ICD-715.90) & ?CTS w/ wrist pain> wrist splint Rx Qhs helps... LOW BACK PAIN >> new symptom 6/15, no obvious trauma etc; he had pos SLR on right & is referred to Ortho + Rx w/ rest, hear, brace, Robaxin Tid prn...  ONYCHOMYCOSIS (ICD-110.1) - Mar09 notes nail fungus on left hand and both feet... LAMISIL 250mg /d started... hands resolved now and toes are improved.   Past Surgical History  Procedure Laterality Date  . Angioplasty / stenting femoral  09/09/2002    Outpatient Encounter Prescriptions as of 11/23/2013  Medication Sig  . ALPRAZolam (XANAX) 0.5 MG tablet TAKE 1/2 TO 1 TABLET BY MOUTH THREE TIMES DAILY AS DIRECTED  . aspirin 81 MG EC tablet Take 1 tablet (81 mg total) by mouth daily.  Marland Kitchen atorvastatin (LIPITOR) 80 MG tablet TAKE ONE TABLET BY MOUTH EVERY DAY  .  esomeprazole (NEXIUM) 20 MG capsule Take 20 mg by mouth daily before breakfast.  . metFORMIN (GLUCOPHAGE) 500 MG tablet take 1/2 tablet by mouth two times daily.  . metFORMIN (GLUCOPHAGE) 500 MG tablet TAKE ONE TABLET BY MOUTH TWICE DAILY WITH MEALS  . methocarbamol (ROBAXIN) 500 MG tablet Take 1 tablet (500 mg total) by mouth 3 (three) times daily as needed for muscle spasms.  . metoprolol (LOPRESSOR) 50 MG tablet TAKE ONE TABLET BY MOUTH TWICE DAILY  . nitroGLYCERIN (NITROSTAT) 0.4 MG SL tablet Place 1 tablet (0.4 mg total) under the tongue every 5 (five) minutes as needed.  . ONE TOUCH ULTRA TEST test strip   . tamsulosin (FLOMAX) 0.4 MG CAPS capsule Take 0.4 mg by mouth daily after supper.    No Known Allergies   Current Medications, Allergies, Past Medical History, Past Surgical History, Family History, and Social History were reviewed in [employer] record.   Review of Systems    The patient denies fever, chills, sweats, anorexia, fatigue, weakness, malaise, weight loss, sleep disorder, blurring, diplopia, eye irritation, eye discharge, vision loss, eye pain, photophobia, earache, ear discharge, tinnitus, decreased hearing, nasal congestion, nosebleeds, sore throat, hoarseness, chest pain, palpitations, syncope, dyspnea on exertion, orthopnea, PND, peripheral edema, cough, dyspnea at rest, excessive sputum, hemoptysis, wheezing, pleurisy, nausea, vomiting, diarrhea, constipation, change in bowel habits, abdominal pain, melena, hematochezia, jaundice, gas/bloating, indigestion/heartburn, dysphagia, odynophagia, dysuria, hematuria, urinary frequency, urinary hesitancy, nocturia, incontinence, back pain, joint pain, joint swelling, muscle cramps, muscle weakness, stiffness, arthritis, sciatica, restless legs, leg pain at night, leg pain with exertion, rash, itching, dryness, suspicious lesions, paralysis, paresthesias, seizures, tremors, vertigo, transient blindness,  frequent falls, frequent headaches, difficulty walking, depression, anxiety, memory loss, confusion, cold intolerance, heat intolerance, polydipsia, polyphagia, polyuria, unusual weight change, abnormal bruising, bleeding, enlarged lymph nodes, urticaria, allergic rash, hay fever, and recurrent infections.     Objective:   Physical Exam     WD, Overweight, 63  y/o WM in NAD...  GENERAL:  Alert & oriented; pleasant & cooperative... HEENT:  [place]/AT, EOM-wnl, PERRLA, EACs-clear, TMs-wnl, NOSE-clear, THROAT-clear & wnl. NECK:  Supple w/ fairROM; no JVD; normal carotid impulses w/o bruits; no thyromegaly or nodules palpated; no lymphadenopathy. CHEST:  Clear to P & A; without wheezes/ rales/ or rhonchi. HEART:  Regular Rhythm;  gr 1/6 SEM without rubs or gallops detected... ABDOMEN:  Soft & min tender in RUQ; normal bowel sounds; no organomegaly or masses palpated... EXT: without deformities, mild arthritic changes; no varicose veins/ venous insuffic/ or edema. NEURO:  CN's intact;  no focal neuro deficits... DERM:  onychomycosis improved as noted...  RADIOLOGY DATA:  Reviewed in the EPIC EMR & discussed w/ the patient...  LABORATORY DATA:  Reviewed in the EPIC EMR & discussed w/ the patient...   Assessment:      HBP & severe LVH>  BP seems well controlled on BBlocker monotherapy; we reviewed no salt, & wt reduction...   CAD> on ASA81 & Metoprolol50Bid; he has unusual symptom complex that is persisting even off meds (initially he thought stopping Metformin eliminated his pain etc); will discuss w/ Cards regarding poss cath...  Hyperlipidemia> on Lip80; FLP shows improved LDL=137 but still not near goal of <70; offered lipid clinic but he declined; ? If taking med regularly & we discussed this...  DM> on Metform500-1/2Bid now; BS was122, A1c was 6.4 on 500Bid; we reviewed diet, exercise & wt reduction strategies...  Overweight> wt= 191# & he knows the importance of wt reduction...  GI>  GERD, Divertics, Hx ischemic colitis> on Omep20 for reflux esoph; continue same.  DJD> he uses OTC analgesics as needed...     Plan:      Patient's Medications  New Prescriptions   No medications on file  Previous Medications   ALPRAZOLAM (XANAX) 0.5 MG TABLET    TAKE 1/2 TO 1 TABLET BY MOUTH THREE TIMES DAILY AS DIRECTED   ASPIRIN 81 MG EC TABLET    Take 1 tablet (81 mg total) by mouth daily.   ATORVASTATIN (LIPITOR) 80 MG TABLET    TAKE ONE TABLET BY MOUTH EVERY DAY   ESOMEPRAZOLE (NEXIUM) 20 MG CAPSULE    Take 20 mg by mouth daily before breakfast.   METFORMIN (GLUCOPHAGE) 500 MG TABLET    TAKE ONE TABLET BY MOUTH TWICE DAILY WITH MEALS   METHOCARBAMOL (ROBAXIN) 500 MG TABLET    Take 1 tablet (500 mg total) by mouth 3 (three) times daily as needed for muscle spasms.   NITROGLYCERIN (NITROSTAT) 0.4 MG SL TABLET    Place 1 tablet (0.4 mg total) under the tongue every 5 (five) minutes as needed.   ONE TOUCH ULTRA TEST TEST STRIP       TAMSULOSIN (FLOMAX) 0.4 MG CAPS CAPSULE    Take 0.4 mg by mouth daily after supper.  Modified Medications   Modified Medication Previous Medication   METOPROLOL (LOPRESSOR) 50 MG TABLET metoprolol (LOPRESSOR) 50 MG tablet      TAKE ONE TABLET BY MOUTH TWICE DAILY    TAKE ONE TABLET BY MOUTH TWICE DAILY  Discontinued Medications   METFORMIN (GLUCOPHAGE) 500 MG TABLET    take 1/2 tablet by mouth two times daily.

## 2013-11-26 ENCOUNTER — Ambulatory Visit: Payer: BC Managed Care – PPO | Admitting: Cardiology

## 2013-12-01 ENCOUNTER — Other Ambulatory Visit: Payer: Self-pay | Admitting: Pulmonary Disease

## 2013-12-07 ENCOUNTER — Telehealth: Payer: Self-pay | Admitting: Pulmonary Disease

## 2013-12-07 NOTE — Telephone Encounter (Signed)
Called and spoke with pts wife and she stated that they are doing a new drug plan through [facility] and they will not cover the nexium 20 mg  But will cover the nexium 40 mg .   SN please advise if ok to change medication.  Thanks  No Known Allergies  Current Outpatient Prescriptions on File Prior to Visit  Medication Sig Dispense Refill  . ALPRAZolam (XANAX) 0.5 MG tablet TAKE 1/2 TO 1 TABLET BY MOUTH THREE TIMES DAILY AS DIRECTED  90 tablet  5  . aspirin 81 MG EC tablet Take 1 tablet (81 mg total) by mouth daily.  30 tablet  0  . atorvastatin (LIPITOR) 80 MG tablet TAKE ONE TABLET BY MOUTH ONCE DAILY  90 tablet  0  . esomeprazole (NEXIUM) 20 MG capsule Take 20 mg by mouth daily before breakfast.      . metFORMIN (GLUCOPHAGE) 500 MG tablet TAKE ONE TABLET BY MOUTH TWICE DAILY WITH MEALS  180 tablet  0  . methocarbamol (ROBAXIN) 500 MG tablet Take 1 tablet (500 mg total) by mouth 3 (three) times daily as needed for muscle spasms.  50 tablet  1  . metoprolol (LOPRESSOR) 50 MG tablet TAKE ONE TABLET BY MOUTH TWICE DAILY  180 tablet  3  . nitroGLYCERIN (NITROSTAT) 0.4 MG SL tablet Place 1 tablet (0.4 mg total) under the tongue every 5 (five) minutes as needed.  25 tablet  1  . ONE TOUCH ULTRA TEST test strip       . tamsulosin (FLOMAX) 0.4 MG CAPS capsule Take 0.4 mg by mouth daily after supper.       No current facility-administered medications on file prior to visit.

## 2013-12-07 NOTE — Telephone Encounter (Signed)
Pt wife returning call.Kurt Kidd' °

## 2013-12-07 NOTE — Telephone Encounter (Signed)
LMTC x 1  

## 2013-12-08 MED ORDER — ESOMEPRAZOLE MAGNESIUM 40 MG PO CPDR: 40.0000 mg | DELAYED_RELEASE_CAPSULE | Freq: Every day | ORAL | Status: DC

## 2013-12-08 NOTE — Telephone Encounter (Signed)
Per SN---  Ok to change to the nexium 40 mg  1 daily.  30 mins before a meal.  rx has been printed out and placed on SN cart to be signed.  Will call pts wife once ready to pick up.

## 2013-12-08 NOTE — Telephone Encounter (Signed)
rx has been signed and i have placed this rx up front and i have called the pts wife and she is aware and will come by tomorrow to pick this up.

## 2014-02-16 ENCOUNTER — Other Ambulatory Visit: Payer: Self-pay | Admitting: Pulmonary Disease

## 2014-04-15 ENCOUNTER — Telehealth: Payer: Self-pay | Admitting: Pulmonary Disease

## 2014-04-15 NOTE — Telephone Encounter (Signed)
Will sign off--

## 2014-04-23 DIAGNOSIS — M62830 Muscle spasm of back: Secondary | ICD-10-CM | POA: Diagnosis not present

## 2014-04-23 DIAGNOSIS — M545 Low back pain: Secondary | ICD-10-CM | POA: Diagnosis not present

## 2014-04-26 ENCOUNTER — Telehealth: Payer: Self-pay | Admitting: Pulmonary Disease

## 2014-04-26 ENCOUNTER — Ambulatory Visit: Payer: Medicare Other | Admitting: Pulmonary Disease

## 2014-04-26 DIAGNOSIS — M5441 Lumbago with sciatica, right side: Secondary | ICD-10-CM | POA: Diagnosis not present

## 2014-04-26 DIAGNOSIS — M545 Low back pain: Secondary | ICD-10-CM

## 2014-04-26 NOTE — Addendum Note (Signed)
Addended by: Desmond Dike C on: 04/26/2014 03:52 PM   Modules accepted: Orders

## 2014-04-26 NOTE — Telephone Encounter (Signed)
Spoke with pt's wife, states that pt hurt lower back on Wednesday.  Pt heard a "pop", has had a hard time walking and is in pain since.  Pt went to urgent care as they were out of town, urgent care doc recommended a mri to further evaluate this.  Pt is on muscle relaxers and pain medications and is still in pain.  Prefers to use [facility].    SN are you ok with ordering the MRI for this?

## 2014-04-26 NOTE — Telephone Encounter (Signed)
Pain is solely in pt's lower back. Ok to order mri?  Thanks!

## 2014-04-26 NOTE — Telephone Encounter (Signed)
Ok to order MRI for the pt.  thanks

## 2014-04-26 NOTE — Telephone Encounter (Signed)
Per SN---  Where in back is he hurting?  Does he want to do MRI Tspine or l/s spine? We can refer him for MRI L/S spine. thanks

## 2014-04-26 NOTE — Telephone Encounter (Signed)
Order has been placed. lmtcb x1

## 2014-04-27 ENCOUNTER — Telehealth: Payer: Self-pay | Admitting: Pulmonary Disease

## 2014-04-27 NOTE — Telephone Encounter (Signed)
Patient says he saw Dr. Delilah Shan. Dr. Delilah Shan put him on Prednisone,muscle relaxer and pain killer, he told him to give it a week on the medication, if not better, they will do MRI at Dr. Scarlette Ar office.  Patient says he is already feeling a lot better on the medication.  FYI to Dr. Lenna Gilford.

## 2014-04-27 NOTE — Telephone Encounter (Signed)
Per Dr. Lenna Gilford: This is ok.

## 2014-05-11 ENCOUNTER — Ambulatory Visit (INDEPENDENT_AMBULATORY_CARE_PROVIDER_SITE_OTHER): Payer: Medicare Other | Admitting: Pulmonary Disease

## 2014-05-11 ENCOUNTER — Encounter: Payer: Self-pay | Admitting: Pulmonary Disease

## 2014-05-11 ENCOUNTER — Other Ambulatory Visit (INDEPENDENT_AMBULATORY_CARE_PROVIDER_SITE_OTHER): Payer: Medicare Other

## 2014-05-11 VITALS — BP 124/82 | HR 70 | Temp 97.0°F | Ht 66.0 in | Wt 181.2 lb

## 2014-05-11 DIAGNOSIS — M545 Low back pain, unspecified: Secondary | ICD-10-CM

## 2014-05-11 DIAGNOSIS — I2510 Atherosclerotic heart disease of native coronary artery without angina pectoris: Secondary | ICD-10-CM | POA: Diagnosis not present

## 2014-05-11 DIAGNOSIS — Z23 Encounter for immunization: Secondary | ICD-10-CM | POA: Diagnosis not present

## 2014-05-11 DIAGNOSIS — N400 Enlarged prostate without lower urinary tract symptoms: Secondary | ICD-10-CM | POA: Diagnosis not present

## 2014-05-11 DIAGNOSIS — E119 Type 2 diabetes mellitus without complications: Secondary | ICD-10-CM

## 2014-05-11 DIAGNOSIS — I10 Essential (primary) hypertension: Secondary | ICD-10-CM

## 2014-05-11 DIAGNOSIS — E785 Hyperlipidemia, unspecified: Secondary | ICD-10-CM

## 2014-05-11 DIAGNOSIS — M150 Primary generalized (osteo)arthritis: Secondary | ICD-10-CM | POA: Diagnosis not present

## 2014-05-11 DIAGNOSIS — F419 Anxiety disorder, unspecified: Secondary | ICD-10-CM

## 2014-05-11 DIAGNOSIS — I340 Nonrheumatic mitral (valve) insufficiency: Secondary | ICD-10-CM | POA: Diagnosis not present

## 2014-05-11 DIAGNOSIS — M159 Polyosteoarthritis, unspecified: Secondary | ICD-10-CM

## 2014-05-11 LAB — HEPATIC FUNCTION PANEL
ALK PHOS: 67 U/L (ref 39–117)
ALT: 25 U/L (ref 0–53)
AST: 18 U/L (ref 0–37)
Albumin: 4.6 g/dL (ref 3.5–5.2)
BILIRUBIN DIRECT: 0.1 mg/dL (ref 0.0–0.3)
Total Bilirubin: 0.7 mg/dL (ref 0.2–1.2)
Total Protein: 7.5 g/dL (ref 6.0–8.3)

## 2014-05-11 LAB — LIPID PANEL
CHOLESTEROL: 256 mg/dL — AB (ref 0–200)
HDL: 71.5 mg/dL (ref >39.00)
LDL Cholesterol: 172 mg/dL — ABNORMAL HIGH (ref 0–99)
NONHDL: 184.5
TRIGLYCERIDES: 61 mg/dL (ref 0.0–149.0)
Total CHOL/HDL Ratio: 4
VLDL: 12.2 mg/dL (ref 0.0–40.0)

## 2014-05-11 LAB — BASIC METABOLIC PANEL
BUN: 14 mg/dL (ref 6–23)
CALCIUM: 10 mg/dL (ref 8.4–10.5)
CO2: 31 mEq/L (ref 19–32)
Chloride: 101 mEq/L (ref 96–112)
Creatinine, Ser: 0.68 mg/dL (ref 0.40–1.50)
GFR: 124.2 mL/min (ref >60.00)
GLUCOSE: 121 mg/dL — AB (ref 70–99)
POTASSIUM: 5 meq/L (ref 3.5–5.1)
Sodium: 137 mEq/L (ref 135–145)

## 2014-05-11 LAB — CBC WITH DIFFERENTIAL/PLATELET
BASOS ABS: 0.1 10*3/uL (ref 0.0–0.1)
Basophils Relative: 0.9 % (ref 0.0–3.0)
Eosinophils Absolute: 0.1 10*3/uL (ref 0.0–0.7)
Eosinophils Relative: 1.7 % (ref 0.0–5.0)
HCT: 45.6 % (ref 39.0–52.0)
HEMOGLOBIN: 15.6 g/dL (ref 13.0–17.0)
LYMPHS ABS: 1.7 10*3/uL (ref 0.7–4.0)
LYMPHS PCT: 24.4 % (ref 12.0–46.0)
MCHC: 34.2 g/dL (ref 30.0–36.0)
MCV: 84.4 fl (ref 78.0–100.0)
Monocytes Absolute: 0.8 10*3/uL (ref 0.1–1.0)
Monocytes Relative: 10.7 % (ref 3.0–12.0)
NEUTROS ABS: 4.4 10*3/uL (ref 1.4–7.7)
Neutrophils Relative %: 62.3 % (ref 43.0–77.0)
Platelets: 288 10*3/uL (ref 150.0–400.0)
RBC: 5.4 Mil/uL (ref 4.22–5.81)
RDW: 14.1 % (ref 11.5–15.5)
WBC: 7.1 10*3/uL (ref 4.0–10.5)

## 2014-05-11 LAB — TSH: TSH: 1.82 u[IU]/mL (ref 0.35–4.50)

## 2014-05-11 LAB — PSA: PSA: 3.1 ng/mL (ref 0.10–4.00)

## 2014-05-11 LAB — HEMOGLOBIN A1C: Hgb A1c MFr Bld: 6.5 % (ref 4.6–6.5)

## 2014-05-11 MED ORDER — METFORMIN HCL 500 MG PO TABS: 500.0000 mg | ORAL_TABLET | Freq: Two times a day (BID) | ORAL | Status: DC

## 2014-05-11 NOTE — Progress Notes (Signed)
Subjective:     Patient ID: Kurt Kidd, male   DOB: 25-Sep-1950, 64 y.o.   MRN: [id number]  HPI 64 y/o WM here for a follow up visit... he has multiple medical problems as noted below...    ~  December 26, 2011:  36moROV & Kurt Kidd wants to know which of his meds reacts w/ beer (usually drinks 1-2 on weekends, but had 6-7 one day at the beach & became jittery, nervous, shaking- last 2H & resolved spont, BS was 142); I told him to just stop drinking that much beer...    HBP> on Metop50Bid; BP= 130/72 & he denies CP, palpit, dizzy, SOB, edema, etc.    CAD> on ATIW580 he is too sedentary but denies angina, etc; had f/u w/ DBenay Spice4/12> 2DEcho showed Ao sclerosis, no stenosis, mild MR, norm LVF; he noted rapid    HR after eating which improved by adjusting his eating habits; decr alcohol & caffeine...    Hyperlipidemia> on Lip80 now ?compliance; FLP shows TChol 220, TG 138, HDL 42, LDL 163; needs to take Lip80 DAILY & needs ZETIA10 added.    DM> on Metform500Bid; BS=110, A1c= wasn't run; we reviewed diet, exercise & wt reduction strategies...    Overweight> wt= 197# & he knows the importance of wt reduction...    GI> GERD, Divertics, Hx ischemic colitis> on Nexium40Bid from DrByers for reflux espoh; he denies abd pain, n/v, d/c, blood seen..    DJD> he uses OTC analgesics as needed... We reviewed prob list, meds, xrays and labs> see below for updates >> OK Flu shot today...  LABS 10/13:  FLP- not at goals on Lip80 w/ LDL=163;  Chems- ok...  ~  June 26, 2012:  610moOV & Kurt Kidd says he feels bad AFTER he eats & we discussed low carb/ low fat diet, smaller portions, etc;  Also c/o weak stream, dribbles, denies incont, bro-in-law dx w/ prostate ca- we discussed trial Flomax & check PSA=2.79;  He also [place]= wnl, neg, no lesions seen...     HBP> on Metop50Bid; BP= 124/82 & he denies CP, palpit, dizzy, SOB, edema, etc.    CAD> on ASDXI338he is too sedentary but  denies angina, etc; [facility]> 2DEcho showed incr wall thickness c/w severeLVH, norm LVF w/ EF=65-70%, mild MR, mild LAdil; he noted rapid HR after eating which improved by adjusting his eating habits, decr alcohol & caffeine...    Hyperlipidemia> on Lip80 ?compliance (NOT taking Zetia); FLP shows TChol 227, TG 136, HDL 43, LDL 173; needs to take Lip80 DAILY & needs ZETIA10 added but he declines due to $$...    DM> on Metform500Bid; BS=111, A1c= 6.1; we reviewed diet, exercise & wt reduction strategies...    Overweight> wt= 193# which is down 4#- keep up the good work...    GI- GERD, Divertics, Hx ischemic colitis> prev on NexiumBid from DrByers for reflux esoph but he stopped on his own & taking Omep20 instead; he denies abd pain, n/v, d/c, blood seen; his Bro had colon cancer- [facility]- wnl & f/u planned 5y81yr.    GU- BPH> not currently on meds; c/o weak stream, dribbles, denies incont, bro-in-law dx w/ prostate ca- we discussed trial Flomax0.4/d & check PSA=2.79...    DJD> he uses OTC analgesics as needed... We reviewed prob list, meds, xrays and labs> see below for updates >>   LABS 4/14:  FLP-  not at goals on Lip80;  Chems- ok w/ BS=111, A1c=6.1;  CBC- wnl;  TSH=1.82;  PSA=2.79;  UA- clear...   ~  December 29, 2012:  72moROV & Kurt Kidd describes an episode where he was at the beach drinking w/ his brother then got SOB w/ palpit when supine at night- vague, poor hx, self limited, ?what to make of this; he notes that his sugars have been "up & down" &he's wondering about IAnastasio Auerbach(has a friend on this)... We reviewed the following medical problems during today's office visit >>     HBP> on Metop50Bid; BP= 132/70 & he denies recurrent CP, palpit, dizzy, SOB, edema, etc.    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    Hyperlipidemia> on Lip80 ?compliance (NOT taking Zetia); FLP 10/14 showed TChol 252, TG 115, HDL 48, LDL 196; needs to take Lip80 DAILY & needs ZETIA10 added but he declines due to $$...    DM> on Metform500Bid; labs 10/14 showed BS=115, A1c= 6.4; we reviewed diet, exercise & wt reduction strategies...    Overweight> wt= 192# which is essentially unchanged- we reviewed diet, exercise, wt reduction strategies...    GI- GERD, Divertics, Hx ischemic colitis> prev on NexiumBid from DrByers for reflux esoph but he stopped on his own & taking Omep20 OTC instead; he denies abd pain, n/v, d/c, blood seen; his Bro had colon cancer- [facility]- wnl & f/u planned 535yr(DrByrnett)...    GU- BPH> not currently on meds; he was prev on Flomax for LTOS but off now & denies urinary symptoms; bro-in-law dx w/ prostate ca- pt's PSA 4/14 = 2.79...    DJD> he uses OTC analgesics as needed... We reviewed prob list, meds, xrays and labs> see below for updates >> given 2014 Flu vaccine today...  LABS 10/14:  FLP- not at goals & Pharm confirms non-compliance w/ Lip80;  Chems- wnl w/ BS=115, A1c=6.4...  ~  Jul 07, 2013:  75m3moV & Kurt Kidd describes unusual symptom complex> about 1.5hrs after eating (every time he eats) he gets a discomfort in his left shoulder/ upper chest area, followed by increased heart rate/ palpit/ shakey and SOB; he's checked BS and they have been normal (no hypoglycemia) & it occurs after eating, not made better by eating; he states "if I don't eat I feel great"; he denies swallowing difficulty, reflux, heartburn, n/v, d/c, change in BMs etc; he admits to some stress w/ daugh  in jail but he doesn't think the symptoms are stress related; this has been going on 2-3 times daily (every time he eats) for ?67yr28yrhe symptoms of palpit, shaking, SOB lasts for about an hour & resolves spont- nothing he has tried has made it better; he thinks the symptoms are similoar to 2004 which lead to a stress test, then cath & stent; he has several cardiac risk factors as noted w/ poor med compliance... We discussed referral to Cards for their assessment (?repeat cath?) & in the interim we wrote for NTG  to try the next time he gets a similar episode...     HBP, severe LVH on 2DEcho> on Metop50Bid; BP= 140/72 & he describes the above unusual "spells" after eating (but notes that if he doesn't eat he feels great)...    CAD> on ASA325; he is active at work washing trucks and denies angina, etc; last saw Benay Spice 6/14> CAD w/ intervention in 2004; cath in 2008 showed mild stenosis at the stent site;  He noted unusual palpit & SOB after eating in 2012- further eval w/ 2DEcho 4/12 showed incr wall thickness c/w severeLVH, norm LVF w/ EF=65-70%, mild MR, mild LAdil; Event monitor was done (?) but no report avail;  Symptoms improved at that time by adjusting his eating habits, decr alcohol & caffeine...    Hyperlipidemia> on Lip80 ?compliance; FLP 5/15 showed TChol 207, TG 109, HDL 48, LDL 137; needs to take Lip80 DAILY, & rec Lipid Clinic (he's refused Zetia due to cost)...    DM> on Metform500Bid; labs 5/15 showed BS=122, last A1c was 6.4 (Oct2014); we reviewed diet, exercise & wt reduction strategies...    Overweight> wt= 189# which is essentially unchanged- we reviewed diet, exercise, wt reduction strategies...    GI- GERD, Divertics, Hx ischemic colitis> prev on NexiumBid from DrByers for reflux esoph but he switched to [place] OTC instead; denies abd pain, n/v, d/c, blood seen; his Bro had colon cancer- [facility] colonoscopy in [facility] was neg- wnl & f/u planned 30yr (DrByrnett)...    GU- BPH> not  currently on meds; he was prev on Flomax for LTOS but off now & denies urinary symptoms; bro-in-law dx w/ prostate ca- pt's PSA 5/15 = 2.63...    DJD> he uses OTC analgesics as needed... We reviewed prob list, meds, xrays and labs> see below for updates >>   CXR 5/15 showed mild cardiomeg, atherosclerotic calcif in Ao, clear lungs, DJD spine...  LABS 5/15:  FLP- sl improved but still not at goals on Lip80 (?compliance);  Chems- ok w/ BS=122;  CBC- wnl;  TSH=1.84;  PSA=2.63...   ~  August 24, 2013:  6wk ROV & recheck> MRonalee Beltshad a thorough cardiac eval by DBenay Spice5/15 w/ 24h urine for catecholamines & metanephrines (neg);  2DEcho (modLVH, norm LVF w/ EF=60-65%, norm wall motion, Gr2DD, modMR, severe LAdilatation);  Myoview (small area of mod severity reversible defect in apic infer & mid inferolat walls, no ST segment changes);  They discussed the findings and pt had decided to STOP all his meds as a trial- initially he thought that stopping the Metformin eliminated his symptom complex but subseq he had another bought of pain/ palpit/ SOB while he was off of all meds for 2 weeks; he has since restarted his medications (ASA81, Metop50Bid, Atorva80, Nexium20, Flomax0.4)) but kept the Metformin at 1/2 tab Bid... As the symptom is persisting- sounds like he may need further eval & will discuss w/ Cards => ADDENDUM> pt notes that all symptoms resolved spont w/o recurrence so far).    He is also c/o new onset LBP w/ some radiation to the right leg assoc w/ pos SLR on that side; denies trauma, no prev hx of back problems, etc; looks like he will need MRI [facility]; in the meanwhile we will Rx w/ rest, heat, back brace, & ROBAXIN 5060mid...  ~  November 23, 2013:  80m75moV & Kurt Kidd states that his chest discomfort has resolved and not recurred,  he remains very active at work, exercising, "I work my butt off" he says;  He denies CP, palpit, SOB, etc;  He remains  on Metoprolol50bid & BP= 128/68 today;  He continues on Lip80 and states that he is now taking it regularly- prev FLPs reflected his intermittent dosing, he is not fasting this AM;  His weight is stable at 191# (BMI=30-31) and he is taking his Metformin500bid more regularly as well... We plan follow up labs after the 1st of the yr & reminded to take all meds every day...     We reviewed prob list, meds, xrays and labs> see below for updates >> OK 2015 Flu vaccine today...   ~  May 11, 2014:  25moROV & Kurt Kidd reports that he has not had any further spells since June2015- resolved spontaneously; his CC is LBP recurrence about 2 weeks ago after washing his motorhome & going [facility] c/o severe LBP w/ radiation to right side, [place] [facility] & they decided to hold off on MRI to check response; he reports MUCH better & he proudly notes that he hasn't missed a day of work!    HBP, severe LVH on 2DEcho> on Metop50Bid; BP= 124/82 & he denies CP, palpit, dizzy, SOB, edema... No further "spells" reported...    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, etc; last saw DBenay Spice6/15> CAD w/ intervention in 2004; cath in 2008 showed mild stenosis at the stent site;  He had noted unusual palpit & SOB after eating in 2012- further eval w/ 2DEcho 4/12 showed incr wall thickness c/w severeLVH, norm LVF w/ EF=65-70%, mild MR, mild LAdil; Event monitor was done (?) but no report avail;  Symptoms improved at that time by adjusting his eating habits, decr alcohol & caffeine;  Recurrent spells of some kind in 2015 w/ 24h urine, repeat 2DEcho & Myoview done by Cards- see below, no change in meds and pt claims all symptoms resolved spont w/o recurrence...    Hyperlipidemia> on Lip80 but compliance is poor (confirmed by Pharm); FLP 3/16 showed TChol 256, TG 61, HDL 72, LDL 172; needs  to take Lip80 DAILY or change to Cres40- I read him the riot act (severe risk factor)...    DM> on Metform500Bid; labs 3/16 showed BS=121 & A1c was 6.5; we reviewed diet, exercise & wt reduction strategies...    Overweight> wt= 181# which is down 10#- we reviewed diet, exercise, wt reduction strategies...    GI- GERD, Divertics, Hx ischemic colitis> prev on NexiumBid from DrByers for reflux esoph but he switched to N[place]C instead; denies abd pain, n/v, d/c, blood seen; his Bro had colon cancer- [facility]- wnl & f/u planned 579yr(DrByrnett)...    GU- BPH> on Flomax0.4 for LTOS & denies urinary symptoms; bro-in-law dx w/ prostate ca- pt's PSA 3/16 = 3.10 (sl incr in PSA velocity & we will reck in 57m70mo    DJD, LBP> [facility], LBP improved after Pred/ ice packs; now on Vicodin & Robaqxin as needed & he will f/u w/ Ortho... We reviewed prob list, meds, xrays and labs> see below for updates >> he is due for a TDAP vaccination today- ok...  LABS 3/16:  FLP- not at goals w/ TChol=256 & LDL=172;  Chems-  ok w/ BS=121, A1c=6.5;  CBC- wnl;  TSH=1.82;  PSA=3.10...  PLAN>> I explained to Kurt Kidd that you miss 100% of the shots you don't take!  Not taking his meds/ Atorva80 every day is the same thing- rec to take it every day, or he will decide if he'd prefer Cres40; continue diet, exercise & other meds the same;  Note that PSA velocity is increased & we will plan recheck PSA in 63moin light of his pos fam hx (bro w/ prostate ca)...           Problem List:    BRONCHITIS, ACUTE (ICD-466.0) - he is an ex-smoker, quit ~15yr.. no recent problems and the exercise program has really helped... ~  CXR 4/13 showed heart at upper lim normal, clear lungs, NAD...Marland Kitchen~  CXR 5/15 showed mild cardiomeg, atherosclerotic calcif in Ao, clear lungs, DJD spine.  HYPERTENSION (ICD-401.9) - on METOPROLOL 5074md... 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 ~  2DEcho 3/12 showed incr wall thickness c/w severeLVH, norm LVF w/ EF=65-70%, mild MR, mild LAdil... ~  4/13:  BP= 148/80 & he remains largely asymptomatic; CXR 4/13 showed heart size at upper lim norm, clear lungs, NAD...  ~  10/13:  BP= 130/72 & he continues to deny CP, palpt, SOB, edema, etc... ~  4/14:  on Metop50Bid; BP= 124/82 & he denies CP, palpit, dizzy, SOB, edema, etc... ~  10/14: on Metop50Bid; BP= 132/70 & he denies recurrent CP, palpit, dizzy, SOB, edema, etc. ~  5/15: Hx HBP & LVH on 2DEcho> on Metop50Bid; BP= 140/72... ~  6/15: on Metoprolol50Bid and BP= 140/66 ~  9/15: on Metop50Bid; BP= 128/68 and he denies CP, palpit, SOB, edema, etc...  ~  3/16: on Metop50Bid; BP= 124/82 and he remains asymptomatic...  CORONARY ARTERY DISEASE (ICD-414.00) - on ASA 325m59m.. followed by DrKaBenay Spiceis notes are reviewed... he has done better recently w/ secondary risk factor reduction... ~  NuclearStressTest 7/04 showed ?anteroseptal scar vs thinning, no ischemia, EF=61%... ~  initial cath 7/04 w/ 80% LAD lesion... stent placed by DrStuckey... ~  last cath 8/08 showed non-obstructive CAD & prev stent site w/ <10% narrowing, EF=65%... ~  he continues asymptomatic w/o CP, palpit, SOB, edema, etc => still w/ mult risk factors & hi risk pt therefore rec f/u w/ Cards for f/u screening... ~  He saw DrKaBenay Spice2> 2DEcho showed incr wall thickness c/w severeLVH, norm LVF w/ EF=65-70%, mild MR, mild LAdil.;  He noted rapid HR after eating which improved by adjusting his eating habits, decr alcohol & caffeine...  ~  He had f/u w/ DrKatz 6/14> CAD w/ intervention in 2004; cath in 2008 showed mild stenosis at the stent site;  He noted unusual palpit & SOB after eating in 2012- further eval w/ 2DEcho 3/12 showed incr wall thickness c/w severeLVH, norm LVF w/ EF=65-70%, mild  MR, mild LAdil; Event monitor was done (?) but no report avail;  Symptoms improved by adjusting his eating habits, decr alcohol & caffeine... ~  EKG 6/14 showed NSR, rate73, LVH w/ repol abn... ~  5/15:  SEE ABOVE DESCRIPTION => referred to Cards for further eval=> [facility] urine for catecholamines & metanephrines (neg);  2DEcho (modLVH, norm LVF w/ EF=60-65%, norm wall motion, Gr2DD, modMR, severe LAdilatation);  Myoview (small area of mod severity reversible  defect in apic infer & mid inferolat walls, no ST segment changes);  They discussed the findings and pt had decided to STOP all his meds as a trial- initially he thought that stopping the Metformin eliminated his symptom complex but subseq he had another bought of pain/ palpit/ SOB while he was off of all meds for 2 weeks; he has since restarted his medications (ASA81, Metop50Bid, Atorva80, Nexium20, Flomax0.4)) but kept the Metformin at 1/2 tab Bid... As the symptom is persisting- sounds like he may need further eval & will discuss w/ Cards => Addendum> the symptoms stopped on their own w/o recurrence he says...  HYPERLIPIDEMIA (ICD-272.4) - on LIPITOR 67m/d now... he stopped his prev Fish Oil supplements ("they gave me gas")...  ~  [place] diet showed TChol 271, TG 443, HDL 31, LDL 155... Rec~ restart ZOCOR 80... ~  [place] Simva80 showed TChol 181, TG 166, HDL 36, LDL 112... same med & get wt down (then lost 14#) ~  [place] on Simva80 showed TChol 186, TG 254, HDL 37, LDL 127... rec- same med, better diet, may need Fibrate etc. ~  FLP 1/10 on Simva80 showed TChol 190, TG 118, HDL 39, LDL 127... rec> get wt down! ~  FLP 7/10 on Simva80 showed TChol 179, TG 121, HDL 48, LDL 107 ~  11/11: not fasting for labs today & we decided to switch to LIPITOR 458md. ~  FLP 4/13 ?on Lip40 showed TChol 230, TG 116, HDL 50, LDL 165... rec to take daily & incr to Lip80. ~  FLP 10/13 on Lip80 showed TChol 220, TG 138, HDL , LDL 163...   Known CAD w/ stent, Add ZETIA10 => he never refilled this Rx due to $$... ~  FLP 4/14 on Lip80 showed TChol 227, TG 136, HDL 43, LDL 173... Reminded to take med regularly... ~  FLP 10/14 on Lip80 showed TChol 252, TG 115, HDL 48, LDL 196... Pharm confirms med noncompliance even though pt insists he's taking it regularly w/ these bad numbers... ~  FLP 5/15 on Lip80 showed TChol 207, TG 109, HDL 48, LDL 137... We discussed lipid clinic referral... ~  FLP 3/16 on Lip80 showed TChol 256, TG 61, HDL 72, LDL 172... Pharm confirmed noncompliance w/ med refills; asked to take Atorva80 every day or switch to Cres40, he will decide...  DIABETES MELLITUS, BORDERLINE (ICD-790.29) - started meds 3/09 w/ METFORMIN 500- now on 1Bid. ~  labs 4/07 showed FBS=134, HgA1c=6.0...Marland KitchenMarland Kitchen  labs 3/09 showed BS= 140, HgA1c= 7.0...Marland KitchenMarland Kitchenetformin started ~  labs 8/09 on Metform500Bid showed BS= 147, HgA1c= 6.5 ~  labs 1/10 on Metform500Bid showed BS= 144, A1c= 6.4...Marland Kitchenrec> keep same. ~  labs 7/10 on Metform500Bid showed BS= 126, A1c= 6.2 ~  labs 11/11 on Metform500Bid showed BS= 108, A1c= 6.3 ~  Labs 4/13 on Metform500Bid showed BS= 107, A1c= 6.3 ~  Labs 10/13 on Metform500Bid showed BS= 110, A1c= not done. ~  Labs 4/14 on Metform500Bid showed BS= 111, A1c= 6.1 ~  Labs 10/14 on Metform500Bid showed BS=115, A1c=6.4 ~  Labs 5/15 on Metform500Bid showed BS= 122 ~  Labs 3/16 on Metform500Bid showed BS= 121, A1c= 6.5; continue same...  OVERWEIGHT (ICD-278.02) - started at 212# Mar09... ~  8/09:  great job w/ diet and exercise program and weight down 14# to 198# ~  1/10:  unfortunately weight back up to 208# after the holidays. ~  7/10: weight down to 198# ~  11/11: weight =  202# ~  4/13: Weight = 199# ~  10/13: Weight = 197# ~  4/14: Weight = 193# ~  10/14:  Weight = 192# ~  5/15:  Weight = 189# ~  3/16:  Weight = 181#  Hx of GERD (ICD-530.81) - hx gastritis on EGD by [facility] 8/04... he takes [place] 67mBid & denies  dysphagia, abd pain, GERD, n/ v/ etc... ~  Abd Sonar 7/10 showed norm GB, norm ducts, incr liver echotexture c/w fatty liver dis, otherw norm- NAD..Marland Kitchen ~  5/15: he switched to Nexium20 OTC prn...  DIVERTICULOSIS OF COLON (ICD-562.10) - colonoscopy 9/06 by DDemetra Shinershowed ischemic colitis- he needs a full diagnostic colonoscopy 64y/o Bro had colon cancer... ~  f/u colonoscopy 9/09 by DrPatterson showed divertics, polyp= polypoid mucosa w/o adenomatous change... f/u planned 369yr ~  [facility]- wnl & f/u planned 5y46yrDrByrnett)...  Hx of ISCHEMIC COLITIS (ICD-557.9) - hospitalized 9/06 w/ lower GIB secondary to ischemic colitis (required 2u transfusion)...   POSITIVE FAMILY Hx of PROSTATE CANCER in Brother >>  ~  Labs 3/16 showed incr PSA to 3.10 which represents an incr in PSA velocity; we will recheck in 48mo5mo  DEGENERATIVE JOINT DISEASE (ICD-715.90) & ?CTS w/ wrist pain> wrist splint Rx Qhs helps... LOW BACK PAIN >> new symptom 6/15, no obvious trauma etc; he had pos SLR on right & is referred to Ortho + Rx w/ rest, hear, brace, Robaxin Tid prn...  ONYCHOMYCOSIS (ICD-110.1) - Mar09 notes nail fungus on left hand and both feet... LAMISIL 250mg45mtarted... hands resolved now and toes are improved.   Past Surgical History  Procedure Laterality Date  . Angioplasty / stenting femoral  09/09/2002    Outpatient Encounter Prescriptions as of 05/11/2014  Medication Sig  . ALPRAZolam (XANAX) 0.5 MG tablet TAKE 1/2 TO 1 TABLET BY MOUTH THREE TIMES DAILY AS DIRECTED  . aspirin 81 MG EC tablet Take 1 tablet (81 mg total) by mouth daily.  . atoMarland Kitchenvastatin (LIPITOR) 80 MG tablet TAKE ONE TABLET BY MOUTH ONCE DAILY  . esomeprazole (NEXIUM) 40 MG capsule Take 1 capsule (40 mg total) by mouth daily. Take 30 minutes before a meal.  . metFORMIN (GLUCOPHAGE) 500 MG tablet TAKE ONE TABLET BY MOUTH TWICE DAILY WITH MEALS  . methocarbamol (ROBAXIN) 500 MG tablet Take 1 tablet  (500 mg total) by mouth 3 (three) times daily as needed for muscle spasms.  . metoprolol (LOPRESSOR) 50 MG tablet TAKE ONE TABLET BY MOUTH TWICE DAILY  . nitroGLYCERIN (NITROSTAT) 0.4 MG SL tablet Place 1 tablet (0.4 mg total) under the tongue every 5 (five) minutes as needed.  . ONE TOUCH ULTRA TEST test strip   . tamsulosin (FLOMAX) 0.4 MG CAPS capsule Take 0.4 mg by mouth daily after supper.    No Known Allergies   Current Medications, Allergies, Past Medical History, Past Surgical History, Family History, and Social History were reviewed in [employer].   Review of Systems    The patient denies fever, chills, sweats, anorexia, fatigue, weakness, malaise, weight loss, sleep disorder, blurring, diplopia, eye irritation, eye discharge, vision loss, eye pain, photophobia, earache, ear discharge, tinnitus, decreased hearing, nasal congestion, nosebleeds, sore throat, hoarseness, chest pain, palpitations, syncope, dyspnea on exertion, orthopnea, PND, peripheral edema, cough, dyspnea at rest, excessive sputum, hemoptysis, wheezing, pleurisy, nausea, vomiting, diarrhea, constipation, change in bowel habits, abdominal pain, melena, hematochezia, jaundice, gas/bloating, indigestion/heartburn, dysphagia, odynophagia, dysuria, hematuria, urinary frequency, urinary hesitancy, nocturia, incontinence,  back pain, joint pain, joint swelling, muscle cramps, muscle weakness, stiffness, arthritis, sciatica, restless legs, leg pain at night, leg pain with exertion, rash, itching, dryness, suspicious lesions, paralysis, paresthesias, seizures, tremors, vertigo, transient blindness, frequent falls, frequent headaches, difficulty walking, depression, anxiety, memory loss, confusion, cold intolerance, heat intolerance, polydipsia, polyphagia, polyuria, unusual weight change, abnormal bruising, bleeding, enlarged lymph nodes, urticaria, allergic rash, hay fever, and recurrent infections.      Objective:   Physical Exam     WD, Overweight, 64 y/o WM in NAD...  GENERAL:  Alert & oriented; pleasant & cooperative... HEENT:  [place]/AT, EOM-wnl, PERRLA, EACs-clear, TMs-wnl, NOSE-clear, THROAT-clear & wnl. NECK:  Supple w/ fairROM; no JVD; normal carotid impulses w/ faint left carotid bruit; no thyromegaly or nodules palpated; no lymphadenopathy. CHEST:  Clear to P & A; without wheezes/ rales/ or rhonchi. HEART:  Regular Rhythm;  gr 1/6 SEM without rubs or gallops detected... ABDOMEN:  Soft & min tender in RUQ; normal bowel sounds; no organomegaly or masses palpated... EXT: without deformities, mild arthritic changes; no varicose veins/ venous insuffic/ or edema. NEURO:  CN's intact;  no focal neuro deficits... DERM:  onychomycosis improved as noted...  RADIOLOGY DATA:  Reviewed in the EPIC EMR & discussed w/ the patient...  LABORATORY DATA:  Reviewed in the EPIC EMR & discussed w/ the patient...   Assessment:      HBP & LVH>  BP seems well controlled on BBlocker monotherapy; we reviewed no salt, & wt reduction...   CAD> on ASA81 & Metoprolol50Bid; he had unusual symptom complex that was evaluated 7 discussed w/ Cards; it seems to have resolved on it's own & he knows to avoid excess alcohol, caffeine, etc...  Hyperlipidemia> on Lip80; FLP shows poor numbers but he is non-compliant according to my phone call to [facility] on [address]; asked to take the Lip80 every day to help prevent an MI or stroke...  DM> on Metform500Bid now; BS 121, A1c 6.5 & we reviewed diet, exercise & wt reduction strategies...  Overweight> wt= 181# & he knows the importance of wt reduction...  GI> GERD, Divertics, Hx ischemic colitis> on Omep20 for reflux esoph; continue same.  DJD, LBP>  He is followed by DrBrooks at [facility] on Vicodin & Robaxin...     Plan:      Patient's Medications  New Prescriptions   No medications on file  Previous Medications   ALPRAZOLAM (XANAX) 0.5 MG TABLET    TAKE  1/2 TO 1 TABLET BY MOUTH THREE TIMES DAILY AS DIRECTED   ASPIRIN 81 MG EC TABLET    Take 1 tablet (81 mg total) by mouth daily.   ATORVASTATIN (LIPITOR) 80 MG TABLET    TAKE ONE TABLET BY MOUTH ONCE DAILY   ESOMEPRAZOLE (NEXIUM) 40 MG CAPSULE    Take 1 capsule (40 mg total) by mouth daily. Take 30 minutes before a meal.   HYDROCODONE-ACETAMINOPHEN (NORCO/VICODIN) 5-325 MG PER TABLET    Take as directed by Dr. Delilah Shan   METHOCARBAMOL (ROBAXIN) 500 MG TABLET    Take 1 tablet (500 mg total) by mouth 3 (three) times daily as needed for muscle spasms.   METOPROLOL (LOPRESSOR) 50 MG TABLET    TAKE ONE TABLET BY MOUTH TWICE DAILY   NITROGLYCERIN (NITROSTAT) 0.4 MG SL TABLET    Place 1 tablet (0.4 mg total) under the tongue every 5 (five) minutes as needed.   ONE TOUCH ULTRA TEST TEST STRIP       TAMSULOSIN (FLOMAX) 0.4 MG  CAPS CAPSULE    Take 0.4 mg by mouth daily after supper.  Modified Medications   Modified Medication Previous Medication   METFORMIN (GLUCOPHAGE) 500 MG TABLET metFORMIN (GLUCOPHAGE) 500 MG tablet      Take 1 tablet (500 mg total) by mouth 2 (two) times daily with a meal.    TAKE ONE TABLET BY MOUTH TWICE DAILY WITH MEALS  Discontinued Medications   No medications on file

## 2014-05-11 NOTE — Patient Instructions (Addendum)
Today we updated your med list in our EPIC system...    Continue your current medications the same...  Today we did your follow up FASTING blood worrk...    We will contact you w/ the results when available...   Keep up the good work w/ low carb low fat diet & weight reduction...  You will be due for a Cardiology follow up in May or June of this yr...  We gave you the combination Tetanus vaccine called the TDAP today (it is good for 84yrs)...  Call for any questions...  Let's plan a follow up visit in 36mo, sooner if needed for problems.Marland KitchenMarland Kitchen

## 2014-05-19 NOTE — Progress Notes (Signed)
Quick Note:  Called and spoke to pt's wife. Informed pt's wife of the results and recs per SN. Pt's wife verbalized understanding and denied any further questions or concerns at this time. Pt's wife stated she would relay the results to the pt.   ______

## 2014-05-24 ENCOUNTER — Other Ambulatory Visit: Payer: Self-pay | Admitting: Pulmonary Disease

## 2014-08-03 ENCOUNTER — Other Ambulatory Visit: Payer: Self-pay | Admitting: Pulmonary Disease

## 2014-09-08 ENCOUNTER — Other Ambulatory Visit: Payer: Self-pay | Admitting: Pulmonary Disease

## 2014-11-03 DIAGNOSIS — H11051 Peripheral pterygium, progressive, right eye: Secondary | ICD-10-CM | POA: Diagnosis not present

## 2014-11-03 DIAGNOSIS — E119 Type 2 diabetes mellitus without complications: Secondary | ICD-10-CM | POA: Diagnosis not present

## 2014-11-03 DIAGNOSIS — H2513 Age-related nuclear cataract, bilateral: Secondary | ICD-10-CM | POA: Diagnosis not present

## 2014-11-16 ENCOUNTER — Other Ambulatory Visit (INDEPENDENT_AMBULATORY_CARE_PROVIDER_SITE_OTHER): Payer: Medicare Other

## 2014-11-16 ENCOUNTER — Ambulatory Visit (INDEPENDENT_AMBULATORY_CARE_PROVIDER_SITE_OTHER): Payer: Medicare Other | Admitting: Pulmonary Disease

## 2014-11-16 ENCOUNTER — Encounter: Payer: Self-pay | Admitting: Pulmonary Disease

## 2014-11-16 VITALS — BP 130/80 | HR 68 | Temp 97.6°F | Wt 193.4 lb

## 2014-11-16 DIAGNOSIS — M159 Polyosteoarthritis, unspecified: Secondary | ICD-10-CM

## 2014-11-16 DIAGNOSIS — E119 Type 2 diabetes mellitus without complications: Secondary | ICD-10-CM

## 2014-11-16 DIAGNOSIS — Z23 Encounter for immunization: Secondary | ICD-10-CM | POA: Diagnosis not present

## 2014-11-16 DIAGNOSIS — I10 Essential (primary) hypertension: Secondary | ICD-10-CM

## 2014-11-16 DIAGNOSIS — N400 Enlarged prostate without lower urinary tract symptoms: Secondary | ICD-10-CM

## 2014-11-16 DIAGNOSIS — E785 Hyperlipidemia, unspecified: Secondary | ICD-10-CM

## 2014-11-16 DIAGNOSIS — M150 Primary generalized (osteo)arthritis: Secondary | ICD-10-CM

## 2014-11-16 DIAGNOSIS — I2510 Atherosclerotic heart disease of native coronary artery without angina pectoris: Secondary | ICD-10-CM | POA: Diagnosis not present

## 2014-11-16 LAB — BASIC METABOLIC PANEL
BUN: 13 mg/dL (ref 6–23)
CO2: 31 mEq/L (ref 19–32)
Calcium: 9.4 mg/dL (ref 8.4–10.5)
Chloride: 101 mEq/L (ref 96–112)
Creatinine, Ser: 0.74 mg/dL (ref 0.40–1.50)
GFR: 112.48 mL/min (ref >60.00)
Glucose, Bld: 120 mg/dL — ABNORMAL HIGH (ref 70–99)
Potassium: 4.4 mEq/L (ref 3.5–5.1)
Sodium: 139 mEq/L (ref 135–145)

## 2014-11-16 LAB — LIPID PANEL
Cholesterol: 191 mg/dL (ref 0–200)
HDL: 48.1 mg/dL (ref >39.00)
LDL CALC: 122 mg/dL — AB (ref 0–99)
NONHDL: 143.22
Total CHOL/HDL Ratio: 4
Triglycerides: 107 mg/dL (ref 0.0–149.0)
VLDL: 21.4 mg/dL (ref 0.0–40.0)

## 2014-11-16 LAB — HEMOGLOBIN A1C: Hgb A1c MFr Bld: 6.3 % (ref 4.6–6.5)

## 2014-11-16 LAB — PSA: PSA: 3.27 ng/mL (ref 0.10–4.00)

## 2014-11-16 NOTE — Patient Instructions (Signed)
Today we updated your med list in our EPIC system...    Continue your current medications the same...  Today we checked your follow up blood work as discussed...    We will contact you w/ the results when available...   We gave you the 2016 Flu vaccine...  Call for any questions...  Let's plan a follow up visit in 78mo, sooner if needed for problems.Marland KitchenMarland Kitchen

## 2014-11-16 NOTE — Progress Notes (Signed)
Subjective:     Patient ID: Kurt Kidd, male   DOB: 1950/03/10, 64 y.o.   MRN: [id number]  HPI 64 y/o WM here for a follow up visit... he has multiple medical problems as noted below...   ~  SEE PREV EPIC NOTES FOR OLDER DATA >>    LABS 10/13:  FLP- not at goals on Lip80 w/ LDL=163;  Chems- ok...  LABS 4/14:  FLP- not at goals on Lip80;  Chems- ok w/ BS=111, A1c=6.1;  CBC- wnl;  TSH=1.82;  PSA=2.79;  UA- clear...   LABS 10/14:  FLP- not at goals & Pharm confirms non-compliance w/ Lip80;  Chems- wnl w/ BS=115, A1c=6.4...  ~  Jul 07, 2013:  64moROV & Kurt Kidd describes unusual symptom complex> about 1.5hrs after eating (every time he eats) he gets a discomfort in his left shoulder/ upper chest area, followed by increased heart rate/ palpit/ shakey and SOB; he's checked BS and they have been normal (no hypoglycemia) & it occurs after eating, not made better by eating; he states "if I don't eat I feel great"; he denies swallowing difficulty, reflux, heartburn, n/v, d/c, change in BMs etc; he admits to some stress w/ daugh in jail but he doesn't think the symptoms are stress related; this has been going on 2-3 times daily (every time he eats) for ?152yr the symptoms of palpit, shaking, SOB lasts for about an hour & resolves spont- nothing he has tried has made it better; he thinks the symptoms are similoar to 2004 which lead to a stress test, then cath & stent; he has several cardiac risk factors as noted w/ poor med compliance... We discussed referral to Cards for their assessment (?repeat cath?) & in the interim we wrote for NTG to try the next time he gets a similar episode...     HBP, severe LVH on 2DEcho> on Metop50Bid; BP= 140/72 & he describes the above unusual "spells" after eating (but notes that if he doesn't eat he feels great)...    CAD> on ASA325; he is active at work washing trucks and denies angina, etc; last saw DrBenay Spice/14> CAD w/ intervention in 2004; cath in 2008 showed mild stenosis at  the stent site;  He noted unusual palpit & SOB after eating in 2012- further eval w/ 2DEcho 4/12 showed incr wall thickness c/w severeLVH, norm LVF w/ EF=65-70%, mild MR, mild LAdil; Event monitor was done (?) but no report avail;  Symptoms improved at that time by adjusting his eating habits, decr alcohol & caffeine...    Hyperlipidemia> on Lip80 ?compliance; FLP 5/15 showed TChol 207, TG 109, HDL 48, LDL 137; needs to take Lip80 DAILY, & rec Lipid Clinic (he's refused Zetia due to cost)...    DM> on Metform500Bid; labs 5/15 showed BS=122, last A1c was 6.4 (Oct2014); we reviewed diet, exercise & wt reduction strategies...    Overweight> wt= 189# which is essentially unchanged- we reviewed diet, exercise, wt reduction strategies...    GI- GERD, Divertics, Hx ischemic colitis> prev on NexiumBid from DrByers for reflux esoph but he switched to [place] instead; denies abd pain, n/v, d/c, blood seen; his Bro had colon cancer- [facility]- wnl & f/u planned 5y57yrDrByrnett)...    GU- BPH> not currently on meds; he was prev on Flomax for LTOS but off now & denies urinary symptoms; bro-in-law dx w/ prostate ca- pt's PSA 5/15 = 2.63...    DJD> he uses OTC analgesics as needed...Marland KitchenMarland Kitchen  We reviewed prob list, meds, xrays and labs> see below for updates >>   CXR 5/15 showed mild cardiomeg, atherosclerotic calcif in Ao, clear lungs, DJD spine...  LABS 5/15:  FLP- sl improved but still not at goals on Lip80 (?compliance);  Chems- ok w/ BS=122;  CBC- wnl;  TSH=1.84;  PSA=2.63...   ~  August 24, 2013:  64wk ROV & recheck> Kurt Kidd had a thorough cardiac eval by Benay Spice 5/15 w/ 24h urine for catecholamines & metanephrines (neg);  2DEcho (modLVH, norm LVF w/ EF=60-65%, norm wall motion, Gr2DD, modMR, severe LAdilatation);  Myoview (small area of mod severity reversible defect in apic infer & mid inferolat walls, no ST segment changes);  They discussed the findings and pt had decided to STOP all  his meds as a trial- initially he thought that stopping the Metformin eliminated his symptom complex but subseq he had another bought of pain/ palpit/ SOB while he was off of all meds for 2 weeks; he has since restarted his medications (ASA81, Metop50Bid, Atorva80, Nexium20, Flomax0.4)) but kept the Metformin at 1/2 tab Bid... As the symptom is persisting- sounds like he may need further eval & will discuss w/ Cards => ADDENDUM> pt notes that all symptoms resolved spont w/o recurrence so far).    He is also c/o new onset LBP w/ some radiation to the right leg assoc w/ pos SLR on that side; denies trauma, no prev hx of back problems, etc; looks like he will need MRI so we will refer to [facility] for XRays & MRI if deemed indicated; in the meanwhile we will Rx w/ rest, heat, back brace, & ROBAXIN 530mTid...  ~  November 23, 2013:  6666moOV & Kurt Kidd states that his chest discomfort has resolved and not recurred, he remains very active at work, exercising, "I work my butt off" he says;  He denies CP, palpit, SOB, etc;  He remains on Metoprolol50bid & BP= 128/68 today;  He continues on Lip80 and states that he is now taking it regularly- prev FLPs reflected his intermittent dosing, he is not fasting this AM;  His weight is stable at 191# (BMI=30-31) and he is taking his Metformin500bid more regularly as well... We plan follow up labs after the 1st of the yr & reminded to take all meds every day...     We reviewed prob list, meds, xrays and labs> see below for updates >> OK 2015 Flu vaccine today...   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    HBP, severe LVH on 2DEcho> on Metop50Bid; BP= 124/82 & he denies CP, palpit, dizzy, SOB, edema... No further "spells" reported...    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, etc; last saw DrKBenay Spice15> CAD w/ intervention in 2004; cath in 2008 showed mild stenosis at the stent site;  He had noted unusual palpit & SOB after eating in 2012- further eval w/ 2DEcho  4/12 showed incr wall thickness c/w severeLVH, norm LVF w/ EF=65-70%, mild MR, mild LAdil; 2DEcho 5/15= (modLVH, norm LVF w/ EF=60-65%, norm wall motion, Gr2DD, modMR, severe LAdilatation);  Myoview 5/15= (small area of mod severity reversible defect in apic infer & mid inferolat walls, no ST segment changes); Symptoms improved at that time by adjusting his eating habits, decr alcohol & caffeine;  Recurrent spells of some kind in 2015 w/ 24h urine, repeat 2DEcho & Myoview done by Cards- see below, no change in meds and pt claims all symptoms resolved spont w/o recurrence...    Hyperlipidemia> on Lip80 but compliance is poor (confirmed by Pharm); FLP 3/16 showed TChol 256, TG 61, HDL 72, LDL 172; needs to take Lip80 DAILY or change to Cres40- I read him the riot act (severe risk factor)...    DM> on Metform500Bid; labs 3/16 showed BS=121 & A1c was 6.5; we reviewed diet, exercise & wt reduction strategies...    Overweight> wt= 181# which is down 10#- we reviewed diet, exercise, wt reduction strategies...    GI- GERD, Divertics, Hx ischemic colitis> prev on NexiumBid from DrByers for reflux esoph but he switched to [place] OTC instead; denies abd pain, n/v, d/c, blood seen; his Bro had colon cancer- last colonoscopy in [facility] 2/14 was neg- wnl & f/u planned 68yr (DrByrnett)...    GU- BPH> on Flomax0.4 for LTOS & denies urinary symptoms; bro-in-law dx w/ prostate ca- pt's PSA 3/16 = 3.10 (sl incr in PSA velocity &  we will reck in 621mo.    DJD, LBP> [facility], LBP improved after Pred/ ice packs; now on Vicodin & Robaqxin as needed & he will f/u w/ Ortho... We reviewed prob list, meds, xrays and labs> see below for updates >> he is due for a TDAP vaccination today- ok...  LABS 3/16:  FLP- not at goals w/ TChol=256 & LDL=172;  Chems- ok w/ BS=121, A1c=6.5;  CBC- wnl;  TSH=1.82;  PSA=3.10...  PLAN>> I explained to Kurt Beltshat you miss 100% of the shots you don't take!  Not taking his meds/ Atorva80 every day is the same thing- rec to take it every day, or he will decide if he'd prefer Cres40; continue diet, exercise & other meds the same;  Note that PSA velocity is increased & we will plan recheck PSA in 57m74mo light of his pos fam hx (bro w/ prostate ca)...   ~  November 16, 2014:  57mo74mo & Kurt Kidd that he's doing satis (see 05/11/14 note above)- his back is improved, no flair in the back pain, walking daily, etc;  He's been taking the Lipitor80 every day & due for an FLP recheck;  Also due for a f/u PSA due to a prev incr in the PSA velocity...     EXAM shows Afeb, VSS, O2sat=97%;  HEENT- neg, mallampati1;  Chest- clear w/o w/r/r;  Heart- RR gr 2/6 SEM w/o r/g, bruit at base of neck/ upper chest;  Abd- soft, neg;  Ext- neg w/o c/c/e...  LABS 9/16:  FLP on Lip80 showed TChol 191, TG 107, HDL 48, LDL 122;  Chems- ok x BS=120, A1c=6.3;  PSA= 3.27 (sl higher but still <4.0)    IMP/PLAN>>  Chol & BS are OK;  PSA is sl higher but still <4 so we will recheck in 57mo;52motinue same meds... Given the 2016 FLU vaccine today...           Problem List:    BRONCHITIS,  ACUTE (ICD-466.0) - he is an ex-smoker, quit ~65yr... no recent problems and the exercise program has really helped... ~  CXR 4/13 showed heart at upper lim normal, clear lungs, NAD..Marland Kitchen ~  CXR 5/15 showed mild cardiomeg, atherosclerotic calcif in Ao, clear lungs, DJD spine.  HYPERTENSION (ICD-401.9) - on METOPROLOL  542mid... 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 ~  2DEcho 3/12 showed incr wall thickness c/w severeLVH, norm LVF w/ EF=65-70%, mild MR, mild LAdil... ~  4/13:  BP= 148/80 & he remains largely asymptomatic; CXR 4/13 showed heart size at upper lim norm, clear lungs, NAD...  ~  10/13:  BP= 130/72 & he continues to deny CP, palpt, SOB, edema, etc... ~  4/14:  on Metop50Bid; BP= 124/82 & he denies CP, palpit, dizzy, SOB, edema, etc... ~  10/14: on Metop50Bid; BP= 132/70 & he denies recurrent CP, palpit, dizzy, SOB, edema, etc. ~  5/15: Hx HBP & LVH on 2DEcho> on Metop50Bid; BP= 140/72... ~  6/15: on Metoprolol50Bid and BP= 140/66 ~  9/15: on Metop50Bid; BP= 128/68 and he denies CP, palpit, SOB, edema, etc...  ~  3/16: on Metop50Bid; BP= 124/82 and he remains asymptomatic... ~  9/16: on Metop50Bid; BP= 130/80 and he denies CP, palpit, SOB, edema, etc...  CORONARY ARTERY DISEASE (ICD-414.00) - on ASA 32579m... followed by DrKBenay Spicehis notes are reviewed... he has done better recently w/ secondary risk factor reduction... ~  NuclearStressTest 7/04 showed ?anteroseptal scar vs thinning, no ischemia, EF=61%... ~  initial cath 7/04 w/ 80% LAD lesion... stent placed by DrStuckey... ~  last cath 8/08 showed non-obstructive CAD & prev stent site w/ <10% narrowing, EF=65%... ~  he continues asymptomatic w/o CP, palpit, SOB, edema, etc => still w/ mult risk factors & hi risk pt therefore rec f/u w/ Cards for f/u screening... ~  He saw DrKBenay Spice12> 2DEcho showed incr wall thickness c/w severeLVH, norm LVF w/ EF=65-70%, mild MR, mild LAdil.;  He noted rapid HR after eating which improved by adjusting his eating habits, decr alcohol & caffeine...  ~  He had f/u w/ DrKatz 6/14> CAD w/ intervention in 2004; cath in 2008 showed mild stenosis at the stent site;  He  noted unusual palpit & SOB after eating in 2012- further eval w/ 2DEcho 3/12 showed incr wall thickness c/w severeLVH, norm LVF w/ EF=65-70%, mild MR, mild LAdil; Event monitor was done (?) but no report avail;  Symptoms improved by adjusting his eating habits, decr alcohol & caffeine... ~  EKG 6/14 showed NSR, rate73, LVH w/ repol abn... ~  5/15:  SEE ABOVE DESCRIPTION => referred to Cards for further eval=> [facility] for catecholamines & metanephrines (neg);  2DEcho (modLVH, norm LVF w/ EF=60-65%, norm wall motion, Gr2DD, modMR, severe LAdilatation);  Myoview (small area of mod severity reversible defect in apic infer & mid inferolat walls, no ST segment changes);  They discussed the findings and pt had decided to STOP all his meds as a trial- initially he thought that stopping the Metformin eliminated his symptom complex but subseq he had another bought of pain/ palpit/ SOB while he was off of all meds for 2 weeks; he has since restarted his medications (ASA81, Metop50Bid, Atorva80, Nexium20, Flomax0.4)) but kept the Metformin at 1/2 tab Bid... As the symptom is persisting- sounds like he may  need further eval & will discuss w/ Cards => Addendum> the symptoms stopped on their own w/o recurrence he says...  HYPERLIPIDEMIA (ICD-272.4) - on LIPITOR 24m/d now... he stopped his prev Fish Oil supplements ("they gave me gas")...  ~  FHuson4/07 on diet showed TChol 271, TG 443, HDL 31, LDL 155... Rec~ restart ZOCOR 80... ~  FWhigham3/09 on Simva80 showed TChol 181, TG 166, HDL 36, LDL 112... same med & get wt down (then lost 14#) ~  FMiddlet[place]8/09 on Simva80 showed TChol 186, TG 254, HDL 37, LDL 127... rec- same med, better diet, may need Fibrate etc. ~  FLP 1/10 on Simva80 showed TChol 190, TG 118, HDL 39, LDL 127... rec> get wt down! ~  FLP 7/10 on Simva80 showed TChol 179, TG 121, HDL 48, LDL 107 ~  11/11: not fasting for labs today & we decided to switch to LIPITOR 423md. ~  FLP 4/13 ?on Lip40  showed TChol 230, TG 116, HDL 50, LDL 165... rec to take daily & incr to Lip80. ~  FLP 10/13 on Lip80 showed TChol 220, TG 138, HDL , LDL 163...  Known CAD w/ stent, Add [id number] => he never refilled this Rx due to $$... ~  FLP 4/14 on Lip80 showed TChol 227, TG 136, HDL 43, LDL 173... Reminded to take med regularly... ~  FLP 10/14 on Lip80 showed TChol 252, TG 115, HDL 48, LDL 196... Pharm confirms med noncompliance even though pt insists he's taking it regularly w/ these bad numbers... ~  FLP 5/15 on Lip80 showed TChol 207, TG 109, HDL 48, LDL 137... We discussed lipid clinic referral... ~  FLP 3/16 on Lip80 showed TChol 256, TG 61, HDL 72, LDL 172... Pharm confirmed noncompliance w/ med refills; asked to take Atorva80 every day or switch to Cres40, he will decide... ~  FLGarden/16 on Lip80 w/ better compliance showed TChol 191, TG 107, HDL 48, LDL 122...  DIABETES MELLITUS, BORDERLINE (ICD-790.29) - started meds 3/09 w/ METFORMIN 500- now on 1Bid. ~  labs 4/07 showed FBS=134, HgA1c=6.0...Marland KitchenMarland Kitchen  labs 3/09 showed BS= 140, HgA1c= 7.0...Marland KitchenMarland Kitchenetformin started ~  labs 8/09 on Metform500Bid showed BS= 147, HgA1c= 6.5 ~  labs 1/10 on Metform500Bid showed BS= 144, A1c= 6.4...Marland Kitchenrec> keep same. ~  labs 7/10 on Metform500Bid showed BS= 126, A1c= 6.2 ~  labs 11/11 on Metform500Bid showed BS= 108, A1c= 6.3 ~  Labs 4/13 on Metform500Bid showed BS= 107, A1c= 6.3 ~  Labs 10/13 on Metform500Bid showed BS= 110, A1c= not done. ~  Labs 4/14 on Metform500Bid showed BS= 111, A1c= 6.1 ~  Labs 10/14 on Metform500Bid showed BS=115, A1c=6.4 ~  Labs 5/15 on Metform500Bid showed BS= 122 ~  Labs 3/16 on Metform500Bid showed BS= 121, A1c= 6.5; continue same... ~  Labs 9/16 on Metform500Bid showed BS= 120, A1c= 6.3  OVERWEIGHT (ICD-278.02) - started at 212# Mar09... ~  8/09:  great job w/ diet and exercise program and weight down 14# to 198# ~  1/10:  unfortunately weight back up to 208# after the holidays. ~  7/10: weight down  to 198# ~  11/11: weight = 202# ~  4/13: Weight = 199# ~  10/13: Weight = 197# ~  4/14: Weight = 193# ~  10/14:  Weight = 192# ~  5/15:  Weight = 189# ~  3/16:  Weight = 181# ~  9/16:  Weight = 193#  Hx of GERD (ICD-530.81) - hx gastritis on EGD by  DrPatterson 8/04... he takes [place] 38mBid & denies dysphagia, abd pain, GERD, n/ v/ etc... ~  Abd Sonar 7/10 showed norm GB, norm ducts, incr liver echotexture c/w fatty liver dis, otherw norm- NAD..Marland Kitchen ~  5/15: he switched to Nexium20 OTC prn...  DIVERTICULOSIS OF COLON (ICD-562.10) - colonoscopy 9/06 by DDemetra Shinershowed ischemic colitis- he needs a full diagnostic colonoscopy 64y/o Bro had colon cancer... ~  f/u colonoscopy 9/09 by DrPatterson showed divertics, polyp= polypoid mucosa w/o adenomatous change... f/u planned 348yr ~  [facility]- wnl & f/u planned 5y48yrDrByrnett)...  Hx of ISCHEMIC COLITIS (ICD-557.9) - hospitalized 9/06 w/ lower GIB secondary to ischemic colitis (required 2u transfusion)...   POSITIVE FAMILY Hx of PROSTATE CANCER in Brother >>  ~  Labs 3/16 showed incr PSA to 3.10 which represents an incr in PSA velocity; we will recheck in 76mo376mo  DEGENERATIVE JOINT DISEASE (ICD-715.90) & ?CTS w/ wrist pain> wrist splint Rx Qhs helps... LOW BACK PAIN >> new symptom 6/15, no obvious trauma etc; he had pos SLR on right & is referred to Ortho + Rx w/ rest, hear, brace, Robaxin Tid prn...  ONYCHOMYCOSIS (ICD-110.1) - Mar09 notes nail fungus on left hand and both feet... LAMISIL 250mg73mtarted... hands resolved now and toes are improved.   Past Surgical History  Procedure Laterality Date  . Angioplasty / stenting femoral  09/09/2002    Outpatient Encounter Prescriptions as of 11/16/2014  Medication Sig  . ALPRAZolam (XANAX) 0.5 MG tablet TAKE 1/2 TO 1 TABLET BY MOUTH THREE TIMES DAILY AS DIRECTED  . aspirin 81 MG EC tablet Take 1 tablet (81 mg total) by mouth daily.  . atoMarland Kitchenvastatin  (LIPITOR) 80 MG tablet TAKE ONE TABLET BY MOUTH ONCE DAILY  . esomeprazole (NEXIUM) 40 MG capsule Take 1 capsule (40 mg total) by mouth daily. Take 30 minutes before a meal.  . HYDROcodone-acetaminophen (NORCO/VICODIN) 5-325 MG per tablet Take as directed by Dr. KendaDelilah ShanetFORMIN (GLUCOPHAGE) 500 MG tablet Take 1 tablet (500 mg total) by mouth 2 (two) times daily with a meal.  . methocarbamol (ROBAXIN) 500 MG tablet Take 1 tablet (500 mg total) by mouth 3 (three) times daily as needed for muscle spasms.  . metoprolol (LOPRESSOR) 50 MG tablet TAKE ONE TABLET BY MOUTH TWICE DAILY  . nitroGLYCERIN (NITROSTAT) 0.4 MG SL tablet Place 1 tablet (0.4 mg total) under the tongue every 5 (five) minutes as needed.  . ONE TOUCH ULTRA TEST test strip   . tamsulosin (FLOMAX) 0.4 MG CAPS capsule Take 0.4 mg by mouth daily after supper.  . tamsulosin (FLOMAX) 0.4 MG CAPS capsule TAKE ONE CAPSULE BY MOUTH ONCE DAILY AFTER SUPPER   No facility-administered encounter medications on file as of 11/16/2014.    No Known Allergies   Current Medications, Allergies, Past Medical History, Past Surgical History, Family History, and Social History were reviewed in [employer].   Review of Systems    The patient denies fever, chills, sweats, anorexia, fatigue, weakness, malaise, weight loss, sleep disorder, blurring, diplopia, eye irritation, eye discharge, vision loss, eye pain, photophobia, earache, ear discharge, tinnitus, decreased hearing, nasal congestion, nosebleeds, sore throat, hoarseness, chest pain, palpitations, syncope, dyspnea on exertion, orthopnea, PND, peripheral edema, cough, dyspnea at rest, excessive sputum, hemoptysis, wheezing, pleurisy, nausea, vomiting, diarrhea, constipation, change in bowel habits, abdominal pain, melena, hematochezia, jaundice, gas/bloating, indigestion/heartburn, dysphagia, odynophagia, dysuria, hematuria, urinary frequency, urinary hesitancy,  nocturia, incontinence, back pain, joint  pain, joint swelling, muscle cramps, muscle weakness, stiffness, arthritis, sciatica, restless legs, leg pain at night, leg pain with exertion, rash, itching, dryness, suspicious lesions, paralysis, paresthesias, seizures, tremors, vertigo, transient blindness, frequent falls, frequent headaches, difficulty walking, depression, anxiety, memory loss, confusion, cold intolerance, heat intolerance, polydipsia, polyphagia, polyuria, unusual weight change, abnormal bruising, bleeding, enlarged lymph nodes, urticaria, allergic rash, hay fever, and recurrent infections.     Objective:   Physical Exam     WD, Overweight, 64 y/o WM in NAD...  GENERAL:  Alert & oriented; pleasant & cooperative... HEENT:  [place]/AT, EOM-wnl, PERRLA, EACs-clear, TMs-wnl, NOSE-clear, THROAT-clear & wnl. NECK:  Supple w/ fairROM; no JVD; normal carotid impulses w/ faint left carotid bruit; no thyromegaly or nodules palpated; no lymphadenopathy. CHEST:  Clear to P & A; without wheezes/ rales/ or rhonchi. HEART:  Regular Rhythm;  gr 1/6 SEM without rubs or gallops detected... ABDOMEN:  Soft & min tender in RUQ; normal bowel sounds; no organomegaly or masses palpated... EXT: without deformities, mild arthritic changes; no varicose veins/ venous insuffic/ or edema. NEURO:  CN's intact;  no focal neuro deficits... DERM:  onychomycosis improved as noted...  RADIOLOGY DATA:  Reviewed in the EPIC EMR & discussed w/ the patient...  LABORATORY DATA:  Reviewed in the EPIC EMR & discussed w/ the patient...   Assessment:      HBP & LVH>  BP seems well controlled on BBlocker monotherapy; we reviewed no salt, & wt reduction...   CAD> on ASA81 & Metoprolol50Bid; he had unusual symptom complex that was evaluated 7 discussed w/ Cards; it seems to have resolved on it's own & he knows to avoid excess alcohol, caffeine, etc...  Hyperlipidemia> on Lip80; FLP shows poor numbers but he is non-compliant  according to my phone call to [facility] on [place]; asked to take the Lip80 every day to help prevent an MI or stroke...  DM> on Metform500Bid now; BS 121, A1c 6.5 & we reviewed diet, exercise & wt reduction strategies...  Overweight> wt= 181# & he knows the importance of wt reduction...  GI> GERD, Divertics, Hx ischemic colitis> on Omep20 for reflux esoph; continue same.  Rising PSA>  The PSA velocity has diminished w/ PSA=3.10 in 05/2014 & 3.27 in 11/2014...  DJD, LBP>  He is followed by [facility]Brooks at [facility] on Vicodin & Robaxin...     Plan:      Patient's Medications  New Prescriptions   No medications on file  Previous Medications   ALPRAZOLAM (XANAX) 0.5 MG TABLET    TAKE 1/2 TO 1 TABLET BY MOUTH THREE TIMES DAILY AS DIRECTED   ASPIRIN 81 MG EC TABLET    Take 1 tablet (81 mg total) by mouth daily.   ATORVASTATIN (LIPITOR) 80 MG TABLET    TAKE ONE TABLET BY MOUTH ONCE DAILY   ESOMEPRAZOLE (NEXIUM) 40 MG CAPSULE    Take 1 capsule (40 mg total) by mouth daily. Take 30 minutes before a meal.   HYDROCODONE-ACETAMINOPHEN (NORCO/VICODIN) 5-325 MG PER TABLET    Take as directed by Dr. Delilah Shan   METFORMIN (GLUCOPHAGE) 500 MG TABLET    Take 1 tablet (500 mg total) by mouth 2 (two) times daily with a meal.   METHOCARBAMOL (ROBAXIN) 500 MG TABLET    Take 1 tablet (500 mg total) by mouth 3 (three) times daily as needed for muscle spasms.   METOPROLOL (LOPRESSOR) 50 MG TABLET    TAKE ONE TABLET BY MOUTH TWICE DAILY   NITROGLYCERIN (NITROSTAT) 0.4 MG  SL TABLET    Place 1 tablet (0.4 mg total) under the tongue every 5 (five) minutes as needed.   ONE TOUCH ULTRA TEST TEST STRIP       TAMSULOSIN (FLOMAX) 0.4 MG CAPS CAPSULE    Take 0.4 mg by mouth daily after supper.   TAMSULOSIN (FLOMAX) 0.4 MG CAPS CAPSULE    TAKE ONE CAPSULE BY MOUTH ONCE DAILY AFTER SUPPER  Modified Medications   No medications on file  Discontinued Medications   No medications on file

## 2015-01-20 ENCOUNTER — Telehealth: Payer: Self-pay | Admitting: Pulmonary Disease

## 2015-01-20 ENCOUNTER — Encounter: Payer: Self-pay | Admitting: Gastroenterology

## 2015-01-20 MED ORDER — METHOCARBAMOL 500 MG PO TABS: 500.0000 mg | ORAL_TABLET | Freq: Three times a day (TID) | ORAL | Status: DC | PRN

## 2015-01-20 NOTE — Telephone Encounter (Signed)
Per SN>> Ok to refill #90 with no additional refills  Refill sent electronically to [facility] in [place], [place]  Pt called and notified of refill  Nothing further is needed

## 2015-01-20 NOTE — Telephone Encounter (Signed)
Called spoke with spouse. Pt is wanting refill on methocarbamol. Pt is currently out of town and his previous RX has expired.   Last refilled 08/24/13 #50 x 1 refill Take 1 tablet (500 mg total) by mouth 3 (three) times daily as needed for muscle spasms  Pt will need RX called into [facility] in [place], [place]  Please advise SN thanks

## 2015-02-18 ENCOUNTER — Telehealth: Payer: Self-pay | Admitting: Pulmonary Disease

## 2015-02-18 MED ORDER — ONETOUCH ULTRA SYSTEM W/DEVICE KIT: 1.0000 | PACK | Freq: Once | Status: DC

## 2015-02-18 MED ORDER — GLUCOSE BLOOD VI STRP: ORAL_STRIP | Status: DC

## 2015-02-18 NOTE — Telephone Encounter (Signed)
Spoke with pt's wife, requesting new one touch ultra device and test strips to be sent to [facility] on [address].  This has been sent.  Nothing further needed.

## 2015-02-21 ENCOUNTER — Telehealth: Payer: Self-pay | Admitting: Pulmonary Disease

## 2015-02-21 MED ORDER — ONETOUCH ULTRA SYSTEM W/DEVICE KIT: 1.0000 | PACK | Freq: Once | Status: AC

## 2015-02-21 MED ORDER — GLUCOSE BLOOD VI STRP: ORAL_STRIP | Status: DC

## 2015-02-21 NOTE — Telephone Encounter (Signed)
Left message for patient to call back  

## 2015-02-21 NOTE — Telephone Encounter (Signed)
[phone], pt cb

## 2015-02-21 NOTE — Telephone Encounter (Signed)
Patient's wife called to request that we re-submit the patient's prescription for his testing strips and monitor because the last order was sent without diagnosis codes.  Insurance requires diagnosis codes to pay for the machine and strips.   Re-sent RX with diagnosis codes. Pt's wife notified that Rx has been re-submitted. Nothing further needed. Closing encounter.

## 2015-02-24 ENCOUNTER — Telehealth: Payer: Self-pay | Admitting: Pulmonary Disease

## 2015-02-24 NOTE — Telephone Encounter (Signed)
Called [facility] as DX code was placed on RX when sent in. Was advised the test strips/machine is ready for pick up  [facility] x1 for pt

## 2015-02-24 NOTE — Telephone Encounter (Signed)
Pt returned call [phone]

## 2015-02-24 NOTE — Telephone Encounter (Signed)
Called and spoke with patient's wife, advised her that [facility] spoke with [facility] and they told Mindy that the Test strips and machine are ready to be picked up. She said that she would call them back to confirm that she can pick them up. Nothing further needed.

## 2015-02-28 ENCOUNTER — Other Ambulatory Visit: Payer: Self-pay | Admitting: Pulmonary Disease

## 2015-04-18 ENCOUNTER — Other Ambulatory Visit: Payer: Self-pay | Admitting: Pulmonary Disease

## 2015-04-19 ENCOUNTER — Other Ambulatory Visit: Payer: Self-pay

## 2015-04-25 ENCOUNTER — Telehealth: Payer: Self-pay | Admitting: Pulmonary Disease

## 2015-04-25 NOTE — Telephone Encounter (Signed)
RX was sent into [facility] on 04/19/15 for the flomax LMTCB x1

## 2015-04-25 NOTE — Telephone Encounter (Signed)
Patient Returned call   [phone]

## 2015-04-25 NOTE — Telephone Encounter (Signed)
Called spoke with spouse. She reports [facility] advised her they have no RX on file for pt. I called [facility] and spoke with AJ. Was advised RX was filled 04/25/15 and is ready for pick up. I called made spouse aware and nothing further needed

## 2015-05-16 ENCOUNTER — Encounter: Payer: Self-pay | Admitting: Pulmonary Disease

## 2015-05-16 ENCOUNTER — Ambulatory Visit (INDEPENDENT_AMBULATORY_CARE_PROVIDER_SITE_OTHER)
Admission: RE | Admit: 2015-05-16 | Discharge: 2015-05-16 | Disposition: A | Discharge status: Home / Self Care | Payer: Medicare Other | Source: Ambulatory Visit | Attending: Pulmonary Disease | Admitting: Pulmonary Disease

## 2015-05-16 ENCOUNTER — Other Ambulatory Visit (INDEPENDENT_AMBULATORY_CARE_PROVIDER_SITE_OTHER): Payer: Medicare Other

## 2015-05-16 ENCOUNTER — Ambulatory Visit (INDEPENDENT_AMBULATORY_CARE_PROVIDER_SITE_OTHER): Payer: Medicare Other | Admitting: Pulmonary Disease

## 2015-05-16 VITALS — BP 142/74 | HR 60 | Temp 97.5°F | Ht 66.0 in | Wt 190.7 lb

## 2015-05-16 DIAGNOSIS — I2510 Atherosclerotic heart disease of native coronary artery without angina pectoris: Secondary | ICD-10-CM

## 2015-05-16 DIAGNOSIS — I10 Essential (primary) hypertension: Secondary | ICD-10-CM | POA: Diagnosis not present

## 2015-05-16 DIAGNOSIS — M159 Polyosteoarthritis, unspecified: Secondary | ICD-10-CM

## 2015-05-16 DIAGNOSIS — I340 Nonrheumatic mitral (valve) insufficiency: Secondary | ICD-10-CM

## 2015-05-16 DIAGNOSIS — M150 Primary generalized (osteo)arthritis: Secondary | ICD-10-CM

## 2015-05-16 DIAGNOSIS — E119 Type 2 diabetes mellitus without complications: Secondary | ICD-10-CM | POA: Diagnosis not present

## 2015-05-16 DIAGNOSIS — K219 Gastro-esophageal reflux disease without esophagitis: Secondary | ICD-10-CM

## 2015-05-16 DIAGNOSIS — N400 Enlarged prostate without lower urinary tract symptoms: Secondary | ICD-10-CM

## 2015-05-16 DIAGNOSIS — E785 Hyperlipidemia, unspecified: Secondary | ICD-10-CM

## 2015-05-16 DIAGNOSIS — M545 Low back pain, unspecified: Secondary | ICD-10-CM

## 2015-05-16 DIAGNOSIS — K5730 Diverticulosis of large intestine without perforation or abscess without bleeding: Secondary | ICD-10-CM

## 2015-05-16 DIAGNOSIS — F411 Generalized anxiety disorder: Secondary | ICD-10-CM

## 2015-05-16 DIAGNOSIS — R918 Other nonspecific abnormal finding of lung field: Secondary | ICD-10-CM | POA: Diagnosis not present

## 2015-05-16 LAB — PSA: PSA: 2.45 ng/mL (ref 0.10–4.00)

## 2015-05-16 LAB — COMPREHENSIVE METABOLIC PANEL
ALT: 23 U/L (ref 0–53)
AST: 18 U/L (ref 0–37)
Albumin: 4.6 g/dL (ref 3.5–5.2)
Alkaline Phosphatase: 85 U/L (ref 39–117)
BUN: 12 mg/dL (ref 6–23)
CHLORIDE: 100 meq/L (ref 96–112)
CO2: 30 meq/L (ref 19–32)
CREATININE: 0.68 mg/dL (ref 0.40–1.50)
Calcium: 9.8 mg/dL (ref 8.4–10.5)
GFR: 123.82 mL/min (ref >60.00)
GLUCOSE: 121 mg/dL — AB (ref 70–99)
POTASSIUM: 4.4 meq/L (ref 3.5–5.1)
SODIUM: 141 meq/L (ref 135–145)
Total Bilirubin: 0.5 mg/dL (ref 0.2–1.2)
Total Protein: 7 g/dL (ref 6.0–8.3)

## 2015-05-16 LAB — CBC WITH DIFFERENTIAL/PLATELET
Basophils Absolute: 0.1 10*3/uL (ref 0.0–0.1)
Basophils Relative: 0.9 % (ref 0.0–3.0)
EOS ABS: 0.2 10*3/uL (ref 0.0–0.7)
EOS PCT: 2.8 % (ref 0.0–5.0)
HCT: 41.1 % (ref 39.0–52.0)
Hemoglobin: 14 g/dL (ref 13.0–17.0)
LYMPHS ABS: 1.7 10*3/uL (ref 0.7–4.0)
Lymphocytes Relative: 21.3 % (ref 12.0–46.0)
MCHC: 34 g/dL (ref 30.0–36.0)
MCV: 83.9 fl (ref 78.0–100.0)
MONO ABS: 0.8 10*3/uL (ref 0.1–1.0)
Monocytes Relative: 10 % (ref 3.0–12.0)
NEUTROS PCT: 65 % (ref 43.0–77.0)
Neutro Abs: 5.2 10*3/uL (ref 1.4–7.7)
Platelets: 296 10*3/uL (ref 150.0–400.0)
RBC: 4.89 Mil/uL (ref 4.22–5.81)
RDW: 14.3 % (ref 11.5–15.5)
WBC: 7.9 10*3/uL (ref 4.0–10.5)

## 2015-05-16 LAB — TSH: TSH: 1.85 u[IU]/mL (ref 0.35–4.50)

## 2015-05-16 LAB — LIPID PANEL
CHOL/HDL RATIO: 3
CHOLESTEROL: 168 mg/dL (ref 0–200)
HDL: 49.8 mg/dL (ref >39.00)
LDL CALC: 100 mg/dL — AB (ref 0–99)
NONHDL: 118.45
Triglycerides: 92 mg/dL (ref 0.0–149.0)
VLDL: 18.4 mg/dL (ref 0.0–40.0)

## 2015-05-16 LAB — HEMOGLOBIN A1C: HEMOGLOBIN A1C: 6.4 % (ref 4.6–6.5)

## 2015-05-16 NOTE — Patient Instructions (Signed)
Today we updated your med list in our EPIC system...    Continue your current medications the same...  Today we checked a follow up CXR, EKG, & FASTING blood work...    We will contact you w/ the results when available...   Keep up the good work w/ diet & exercise...  Call for any questions...  Let's plan a follow up visit in 47mo, sooner if needed for problems.Marland KitchenMarland Kitchen

## 2015-05-16 NOTE — Progress Notes (Signed)
Subjective:     Patient ID: Kurt Kidd, male   DOB: November 22, 1950, 65 y.o.   MRN: [id number]  HPI 65 y/o WM here for a follow up visit... he has multiple medical problems as noted below...   ~  SEE PREV EPIC NOTES FOR OLDER DATA >>    LABS 10/13:  FLP- not at goals on Lip80 w/ LDL=163;  Chems- ok...  LABS 4/14:  FLP- not at goals on Lip80;  Chems- ok w/ BS=111, A1c=6.1;  CBC- wnl;  TSH=1.82;  PSA=2.79;  UA- clear...   LABS 10/14:  FLP- not at goals & Pharm confirms non-compliance w/ Lip80;  Chems- wnl w/ BS=115, A1c=6.4.Marland KitchenMarland Kitchen  CXR 5/15 showed mild cardiomeg, atherosclerotic calcif in Ao, clear lungs, DJD spine...  LABS 5/15:  FLP- sl improved but still not at goals on Lip80 (?compliance);  Chems- ok w/ BS=122;  CBC- wnl;  TSH=1.84;  PSA=2.63...   ~  August 24, 2013:  6wk ROV & recheck> Kurt Kidd had a thorough cardiac eval by Benay Spice 5/15 w/ 24h urine for catecholamines & metanephrines (neg);  2DEcho (modLVH, norm LVF w/ EF=60-65%, norm wall motion, Gr2DD, modMR, severe LAdilatation);  Myoview (small area of mod severity reversible defect in apic infer & mid inferolat walls, no ST segment changes);  They discussed the findings and pt had decided to STOP all his meds as a trial- initially he thought that stopping the Metformin eliminated his symptom complex but subseq he had another bought of pain/ palpit/ SOB while he was off of all meds for 2 weeks; he has since restarted his medications (ASA81, Metop50Bid, Atorva80, Nexium20, Flomax0.4)) but kept the Metformin at 1/2 tab Bid... As the symptom is persisting- sounds like he may need further eval & will discuss w/ Cards => ADDENDUM> pt notes that all symptoms resolved spont w/o recurrence so far).    He is also c/o new onset LBP w/ some radiation to the right leg assoc w/ pos SLR on that side; denies trauma, no prev hx of back problems, etc; looks like he will need MRI so we will refer to [facility] for XRays & MRI if deemed indicated; in the meanwhile we  will Rx w/ rest, heat, back brace, & ROBAXIN '500mg'$ Tid...  ~  November 23, 2013:  69moROV & Tip states that his chest discomfort has resolved and not recurred, he remains very active at work, exercising, "I work my butt off" he says;  He denies CP, palpit, SOB, etc;  He remains on Metoprolol50bid & BP= 128/68 today;  He continues on Lip80 and states that he is now taking it regularly- prev FLPs reflected his intermittent dosing, he is not fasting this AM;  His weight is stable at 191# (BMI=30-31) and he is taking his Metformin500bid more regularly as well... We plan follow up labs after the 1st of the yr & reminded to take all meds every day...     We reviewed prob list, meds, xrays and labs> see below for updates >> OK 2015 Flu vaccine today...   ~  May 11, 2014:  676moOV & Kurt Kidd reports that he has not had any further spells since June2015- resolved spontaneously; his CC is LBP recurrence about 2 weeks ago [facility] [place] [facility] [place] & [facility] w/ radiation to right side, [place]; [place] ret [place] [facility] Robax[facility] & they  decided to hold off on MRI to check response; he reports MUCH better & he proudly notes that he hasn't missed a day of work!    HBP, severe LVH on 2DEcho> on Metop50Bid; BP= 124/82 & he denies CP, palpit, dizzy, SOB, edema... No further "spells" reported...    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, etc; last saw Benay Spice 6/15> CAD w/ intervention in 2004; cath in 2008 showed mild stenosis at the stent site;  He had noted unusual palpit & SOB after eating in 2012- further eval w/ 2DEcho 4/12 showed incr wall thickness c/w severeLVH, norm LVF w/ EF=65-70%, mild MR, mild LAdil; 2DEcho 5/15= (modLVH, norm LVF w/ EF=60-65%, norm wall motion, Gr2DD, modMR, severe LAdilatation);  Myoview 5/15= (small area of mod severity reversible defect in  apic infer & mid inferolat walls, no ST segment changes); Symptoms improved at that time by adjusting his eating habits, decr alcohol & caffeine;  Recurrent spells of some kind in 2015 w/ 24h urine, repeat 2DEcho & Myoview done by Cards- see below, no change in meds and pt claims all symptoms resolved spont w/o recurrence...    Hyperlipidemia> on Lip80 but compliance is poor (confirmed by Pharm); FLP 3/16 showed TChol 256, TG 61, HDL 72, LDL 172; needs to take Lip80 DAILY or change to Cres40- I read him the riot act (severe risk factor)...    DM> on Metform500Bid; labs 3/16 showed BS=121 & A1c was 6.5; we reviewed diet, exercise & wt reduction strategies...    Overweight> wt= 181# which is down 10#- we reviewed diet, exercise, wt reduction strategies...    GI- GERD, Divertics, Hx ischemic colitis> prev on NexiumBid from DrByers for reflux esoph but he switched to [place] OTC instead; denies abd pain, n/v, d/c, blood seen; his Bro had colon cancer- last colonoscopy in [facility] 2/14 was neg- wnl & f/u planned 82yr (DrByrnett)...    GU- BPH> on Flomax0.4 for LTOS & denies urinary symptoms; bro-in-law dx w/ prostate ca- pt's PSA 3/16 = 3.10 (sl incr in PSA velocity & we will reck in 612mo.    DJD, LBP> [facility], LBP improved after Pred/ ice packs; now on Vicodin & Robaqxin as needed & he will f/u w/ Ortho... We reviewed prob list, meds, xrays and labs> see below for updates >> he is due for a TDAP vaccination today- ok...  LABS 3/16:  FLP- not at goals w/ TChol=256 & LDL=172;  Chems- ok w/ BS=121, A1c=6.5;  CBC- wnl;  TSH=1.82;  PSA=3.10...  PLAN>> I explained to Kurt Beltshat you miss 100% of the shots you don't take!  Not taking his meds/ Atorva80 every day is the same thing- rec to take it every day, or he will decide if he'd prefer Cres40; continue diet, exercise & other meds the same;  Note that PSA velocity is increased & we will plan recheck PSA in 52m7mo light of his pos fam  hx (bro w/ prostate ca)...   ~  November 16, 2014:  52mo77mo & Kurt Kidd that he's doing satis (see 05/11/14 note above)- his back is improved, no flair in the back pain, walking daily, etc;  He's been taking the Lipitor80 every day & due for an FLP recheck;  Also due for a f/u PSA due to a prev incr in the PSA velocity...     EXAM shows Afeb, VSS, O2sat=97%;  HEENT- neg, mallampati1;  Chest- clear w/o w/r/r;  Heart- RR gr 2/6 SEM w/o r/g, bruit at base of neck/ upper chest;  Abd- soft, neg;  Ext- neg w/o c/c/e...  LABS 9/16:  FLP on Lip80 showed TChol 191, TG 107, HDL 48, LDL 122;  Chems- ok x BS=120, A1c=6.3;  PSA= 3.27 (sl higher but still <4.0)    IMP/PLAN>>  Chol & BS are OK;  PSA is sl higher but still <4 so we will recheck in 687mo continue same meds... Given the 2016 FLU vaccine today...   ~  May 16, 2015:  650moOBassettas had a good interval but concerned that his recent BS checks have been higher than usual & "even up to 200" he says; prev BS all in the 120 range and prev A1c's all in the low6's & due for FASTING blood work today... He tells me that his brother passed away 87m28moo w/ ruptured AAA (Kurt Beltsd Abd Ultrasound 2010= neg)... We reviewed the following medical problems during today's office visit >>     HBP, severe LVH on 2DEcho> on Metop50Bid; BP= 142/74 & he denies CP, palpit, dizzy, SOB, edema... No further "spells" reported...    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, etc; last saw DrKBenay Spice15> CAD w/ intervention in 2004; cath in 2008 showed mild stenosis at the stent site;  He had noted unusual palpit & SOB after eating in 2012- further eval w/ 2DEcho 4/12 showed incr wall thickness c/w severeLVH, norm LVF w/ EF=65-70%, mild MR, mild LAdil; 2DEcho 5/15= (modLVH, norm LVF w/ EF=60-65%, norm wall motion, Gr2DD, modMR, severe LAdilatation);  Myoview 5/15= (small area of mod severity reversible defect in apic infer & mid inferolat walls, no ST segment  changes); Symptoms improved at that time by adjusting his eating habits, decr alcohol & caffeine;  Recurrent spells of some kind in 2015 w/ 24h urine, repeat 2DEcho & Myoview done by Cards- see below, no change in meds and pt claims all symptoms resolved spont w/o recurrence...    Hyperlipidemia> on Lip80 but compliance was poor (better now); FLP 3/17 showed TChol 168, TG 92, HDL 50, LDL 100; needs to take Lip80 DAILY + low chol/ low fat diet...    DM> on Metform500Bid; labs 3/17 showed BS=121 & A1c was 6.4; we reviewed diet, exercise & wt reduction strategies...    Overweight> wt= 191# which is up again- we reviewed diet, exercise, wt reduction strategies...    GI- GERD, Divertics, Hx ischemic colitis> prev on NexiumBid from DrByers for reflux esoph but he switched to [place] instead; denies abd pain, n/v, d/c, blood seen; his Bro had colon cancer- [facility]- wnl & f/u planned 49yr54yrrByrnett)...    GU- BPH> on Flomax0.4 for LTOS & denies urinary symptoms; bro-in-law dx w/ prostate ca- pt's PSA 3/16 = 3.10, but recheck 3/17= 2.45    DJD, LBP> [facility], LBP improved after Pred/ ice packs; now on Vicodin & Robaqxin as needed & he will f/u w/ Ortho... EXAM shows Afeb, VSS, O2sat=97% on RA at rest;  HEENT- neg, mallampati2;  Chest- clear w/o w/r/r;  Heart- RR, no change in Gr2/6 MR murmur w/o r/g;  Abd- soft, neg;  Ext- neg w/o c/c/e;  Neuro- intact...  CXR 05/16/15>  cardiomeg & atherosclerotic changes in Ao, sl pleural thickening & mild diffuse incr markings suggesting interstitial edema, some basilar atx...  EKG 05/16/15>  NSR, rate62, LVH, NSSTTWA,  no acute changes...  FASTING LABS 05/16/15>  FLP- at goals on Lip80;  Chems- wnl x BS=121, A1c=6.4;  CBC- wnl;  TSH=1.85;  PSA=2.45... IMP/PLAN>>  Kurt Kidd's FLP is improved on the Atorva80 more regularly & he is asked to keep it up!  BP controlled, denies CP/ palpait/ SOB/ edema.  DM well  controlled. Overall he needs better diet & exercise program, get the weight down...          Problem List:    BRONCHITIS, ACUTE (ICD-466.0) - he is an ex-smoker, quit ~56yr... no recent problems and the exercise program has really helped... ~  CXR 4/13 showed heart at upper lim normal, clear lungs, NAD..Marland Kitchen ~  CXR 5/15 showed mild cardiomeg, atherosclerotic calcif in Ao, clear lungs, DJD spine. ~  CXR 3/17 showed cardiomeg & atherosclerotic changes in Ao, sl pleural thickening & mild diffuse incr markings suggesting interstitial edema, some basilar atx...  HYPERTENSION (ICD-401.9) - on METOPROLOL '50mg'$ Bid... 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 ~  2DEcho 3/12 showed incr wall thickness c/w severeLVH, norm LVF w/ EF=65-70%, mild MR, mild LAdil... ~  4/13:  BP= 148/80 & he remains largely asymptomatic; CXR 4/13 showed heart size at upper lim norm, clear lungs, NAD...  ~  10/13:  BP= 130/72 & he continues to deny CP, palpt, SOB, edema, etc... ~  4/14:  on Metop50Bid; BP= 124/82 & he denies CP, palpit, dizzy, SOB, edema, etc... ~  10/14: on Metop50Bid; BP= 132/70 & he denies recurrent CP, palpit, dizzy, SOB, edema, etc. ~  5/15: Hx HBP & LVH on 2DEcho> on Metop50Bid; BP= 140/72... ~  6/15: on Metoprolol50Bid and BP= 140/66 ~  9/15: on Metop50Bid; BP= 128/68 and he denies CP, palpit, SOB, edema, etc...  ~  3/16: on Metop50Bid; BP= 124/82 and he remains asymptomatic... ~  9/16: on Metop50Bid; BP= 130/80 and he denies CP, palpit, SOB, edema, etc... ~  3/17 on Metop50Bid; BP= 142/74 & he remains asymptomatic...  CORONARY ARTERY DISEASE (ICD-414.00) - on ASA '325mg'$ /d... followed by DBenay Spice& his notes are reviewed... he has done better recently w/ secondary risk factor reduction... ~  NuclearStressTest 7/04 showed ?anteroseptal scar vs thinning, no  ischemia, EF=61%... ~  initial cath 7/04 w/ 80% LAD lesion... stent placed by DrStuckey... ~  last cath 8/08 showed non-obstructive CAD & prev stent site w/ <10% narrowing, EF=65%... ~  he continues asymptomatic w/o CP, palpit, SOB, edema, etc => still w/ mult risk factors & hi risk pt therefore rec f/u w/ Cards for f/u screening... ~  He saw DBenay Spice4/12> 2DEcho showed incr wall thickness c/w severeLVH, norm LVF w/ EF=65-70%, mild MR, mild LAdil.;  He noted rapid HR after eating which improved by adjusting his eating habits, decr alcohol & caffeine...  ~  He had f/u w/ DrKatz 6/14> CAD w/ intervention in 2004; cath in 2008 showed mild stenosis at the stent site;  He noted unusual palpit & SOB after eating in 2012- further eval w/ 2DEcho 3/12 showed incr wall thickness c/w severeLVH, norm LVF w/ EF=65-70%, mild MR, mild LAdil; Event monitor was done (?) but no report avail;  Symptoms improved by adjusting his eating habits, decr alcohol & caffeine... ~  EKG 6/14 showed NSR, rate73, LVH w/ repol abn... ~  5/15:  SEE ABOVE DESCRIPTION => referred to Cards for further eval=> he saw  DrKatz 5/15 w/ 24h urine for catecholamines & metanephrines (neg);  2DEcho (modLVH, norm LVF w/ EF=60-65%, norm wall motion, Gr2DD, modMR, severe LAdilatation);  Myoview (small area of mod severity reversible defect in apic infer & mid inferolat walls, no ST segment changes);  They discussed the findings and pt had decided to STOP all his meds as a trial- initially he thought that stopping the Metformin eliminated his symptom complex but subseq he had another bought of pain/ palpit/ SOB while he was off of all meds for 2 weeks; he has since restarted his medications (ASA81, Metop50Bid, Atorva80, Nexium20, Flomax0.4)) but kept the Metformin at 1/2 tab Bid... As the symptom is persisting- sounds like he may need further eval & will discuss w/ Cards => Addendum> the symptoms stopped on their own w/o recurrence he says...  ~  EKG 3/17  showed NSR, rate62, LVH, NSSTTWA, no acute changes...  HYPERLIPIDEMIA (ICD-272.4) - on LIPITOR '80mg'$ /d now... he stopped his prev Fish Oil supplements ("they gave me gas")...  ~  [place] 4/07 on diet showed TChol 271, TG 443, HDL 31, LDL 155... Rec~ restart ZOCOR 80... ~  [place] 3/09 on Simva80 showed TChol 181, TG 166, HDL 36, LDL 112... same med & get wt down (then lost 14#) ~  [place] 8/09 on Simva80 showed TChol 186, TG 254, HDL 37, LDL 127... rec- same med, better diet, may need Fibrate etc. ~  FLP 1/10 on Simva80 showed TChol 190, TG 118, HDL 39, LDL 127... rec> get wt down! ~  FLP 7/10 on Simva80 showed TChol 179, TG 121, HDL 48, LDL 107 ~  11/11: not fasting for labs today & we decided to switch to LIPITOR '40mg'$ /d. ~  FLP 4/13 ?on Lip40 showed TChol 230, TG 116, HDL 50, LDL 165... rec to take daily & incr to Lip80. ~  FLP 10/13 on Lip80 showed TChol 220, TG 138, HDL , LDL 163...  Known CAD w/ stent, Add [id number] => he never refilled this Rx due to $$... ~  FLP 4/14 on Lip80 showed TChol 227, TG 136, HDL 43, LDL 173... Reminded to take med regularly... ~  FLP 10/14 on Lip80 showed TChol 252, TG 115, HDL 48, LDL 196... Pharm confirms med noncompliance even though pt insists he's taking it regularly w/ these bad numbers... ~  FLP 5/15 on Lip80 showed TChol 207, TG 109, HDL 48, LDL 137... We discussed lipid clinic referral... ~  FLP 3/16 on Lip80 showed TChol 256, TG 61, HDL 72, LDL 172... Pharm confirmed noncompliance w/ med refills; asked to take Atorva80 every day or switch to Cres40, he will decide... ~  [place] 9/16 on Lip80 w/ better compliance showed TChol 191, TG 107, HDL 48, LDL 122... ~  [place] 3/17 on Lip80 w/ better compliance showed TChol 168, TG 92, HDL 50, LDL 100  DIABETES MELLITUS, BORDERLINE (ICD-790.29) - started meds 3/09 w/ METFORMIN 500- now on 1Bid. ~  labs 4/07 showed FBS=134, HgA1c=6.0.Marland KitchenMarland Kitchen ~  labs 3/09 showed BS= 140, HgA1c= 7.0.Marland KitchenMarland Kitchen Metformin started ~  labs 8/09 on Metform500Bid showed  BS= 147, HgA1c= 6.5 ~  labs 1/10 on Metform500Bid showed BS= 144, A1c= 6.4.Marland Kitchen. rec> keep same. ~  labs 7/10 on Metform500Bid showed BS= 126, A1c= 6.2 ~  labs 11/11 on Metform500Bid showed BS= 108, A1c= 6.3 ~  Labs 4/13 on Metform500Bid showed BS= 107, A1c= 6.3 ~  Labs 10/13 on Metform500Bid showed BS= 110, A1c= not done. ~  Labs 4/14 on Metform500Bid showed BS= 111, A1c=  6.1 ~  Labs 10/14 on Metform500Bid showed BS=115, A1c=6.4 ~  Labs 5/15 on Metform500Bid showed BS= 122 ~  Labs 3/16 on Metform500Bid showed BS= 121, A1c= 6.5; continue same... ~  Labs 9/16 on Metform500Bid showed BS= 120, A1c= 6.3 ~  Labs 3/17 on Metform500Bid showed BS= 121, A1c= 6.4  OVERWEIGHT (ICD-278.02) - started at 212# Mar09... ~  8/09:  great job w/ diet and exercise program and weight down 14# to 198# ~  1/10:  unfortunately weight back up to 208# after the holidays. ~  7/10: weight down to 198# ~  11/11: weight = 202# ~  4/13: Weight = 199# ~  10/13: Weight = 197# ~  4/14: Weight = 193# ~  10/14:  Weight = 192# ~  5/15:  Weight = 189# ~  3/16:  Weight = 181# ~  9/16:  Weight = 193# ~  3/17:  Weight = 191#  Hx of GERD (ICD-530.81) - hx gastritis on EGD by [facility] 8/04... he takes [place] '40mg'$ Bid & denies dysphagia, abd pain, GERD, n/ v/ etc... ~  Abd Sonar 7/10 showed norm GB, norm ducts, incr liver echotexture c/w fatty liver dis, otherw norm- NAD.Marland Kitchen. ~  5/15: he switched to Nexium20 OTC prn...  DIVERTICULOSIS OF COLON (ICD-562.10) - colonoscopy 9/06 by Demetra Shiner showed ischemic colitis- he needs a full diagnostic colonoscopy 65 y/o Bro had colon cancer... ~  f/u colonoscopy 9/09 by DrPatterson showed divertics, polyp= polypoid mucosa w/o adenomatous change... f/u planned 60yr. ~  [facility]- wnl & f/u planned 538yr(DrByrnett)...  Hx of ISCHEMIC COLITIS (ICD-557.9) - hospitalized 9/06 w/ lower GIB secondary to ischemic colitis (required 2u transfusion)...   POSITIVE  FAMILY Hx of PROSTATE CANCER in Brother >>  ~  Labs 3/16 showed incr PSA to 3.10 which represents an incr in PSA velocity; we will recheck in 8m81mo  ~  Follow up PSA readings remain <4... ~  Labs 3/17 showed PSA= 2.45  DEGENERATIVE JOINT DISEASE (ICD-715.90) & ?CTS w/ wrist pain> wrist splint Rx Qhs helps... LOW BACK PAIN >> new symptom 6/15, no obvious trauma etc; he had pos SLR on right & is referred to Ortho + Rx w/ rest, hear, brace, Robaxin Tid prn...  ONYCHOMYCOSIS (ICD-110.1) - Mar09 notes nail fungus on left hand and both feet... LAMISIL '250mg'$ /d started... hands resolved now and toes are improved.   Past Surgical History  Procedure Laterality Date  . Angioplasty / stenting femoral  09/09/2002    Outpatient Encounter Prescriptions as of 05/16/2015  Medication Sig  . ALPRAZolam (XANAX) 0.5 MG tablet TAKE 1/2 TO 1 TABLET BY MOUTH THREE TIMES DAILY AS DIRECTED  . aspirin 81 MG EC tablet Take 1 tablet (81 mg total) by mouth daily.  . aMarland Kitchenorvastatin (LIPITOR) 80 MG tablet TAKE ONE TABLET BY MOUTH ONCE DAILY  . Blood Glucose Monitoring Suppl (ONE TOUCH ULTRA SYSTEM KIT) W/DEVICE KIT 1 kit by Does not apply route once.  . eMarland Kitchenomeprazole (NEXIUM) 40 MG capsule Take 1 capsule (40 mg total) by mouth daily. Take 30 minutes before a meal.  . glucose blood (ONE TOUCH ULTRA TEST) test strip Use as directed to monitor blood glucose readings.  . HMarland KitchenDROcodone-acetaminophen (NORCO/VICODIN) 5-325 MG per tablet Take as directed by Dr. KenDelilah Shan metFORMIN (GLUCOPHAGE) 500 MG tablet Take 1 tablet (500 mg total) by mouth 2 (two) times daily with a meal.  . methocarbamol (ROBAXIN) 500 MG tablet Take 1 tablet (500 mg total) by mouth  3 (three) times daily as needed for muscle spasms.  . metoprolol (LOPRESSOR) 50 MG tablet TAKE ONE TABLET BY MOUTH TWICE DAILY  . nitroGLYCERIN (NITROSTAT) 0.4 MG SL tablet Place 1 tablet (0.4 mg total) under the tongue every 5 (five) minutes as needed.  . tamsulosin (FLOMAX) 0.4  MG CAPS capsule TAKE ONE CAPSULE BY MOUTH ONCE DAILY AFTER SUPPER   No facility-administered encounter medications on file as of 05/16/2015.    No Known Allergies   Current Medications, Allergies, Past Medical History, Past Surgical History, Family History, and Social History were reviewed in [employer] record.   Review of Systems    The patient denies fever, chills, sweats, anorexia, fatigue, weakness, malaise, weight loss, sleep disorder, blurring, diplopia, eye irritation, eye discharge, vision loss, eye pain, photophobia, earache, ear discharge, tinnitus, decreased hearing, nasal congestion, nosebleeds, sore throat, hoarseness, chest pain, palpitations, syncope, dyspnea on exertion, orthopnea, PND, peripheral edema, cough, dyspnea at rest, excessive sputum, hemoptysis, wheezing, pleurisy, nausea, vomiting, diarrhea, constipation, change in bowel habits, abdominal pain, melena, hematochezia, jaundice, gas/bloating, indigestion/heartburn, dysphagia, odynophagia, dysuria, hematuria, urinary frequency, urinary hesitancy, nocturia, incontinence, back pain, joint pain, joint swelling, muscle cramps, muscle weakness, stiffness, arthritis, sciatica, restless legs, leg pain at night, leg pain with exertion, rash, itching, dryness, suspicious lesions, paralysis, paresthesias, seizures, tremors, vertigo, transient blindness, frequent falls, frequent headaches, difficulty walking, depression, anxiety, memory loss, confusion, cold intolerance, heat intolerance, polydipsia, polyphagia, polyuria, unusual weight change, abnormal bruising, bleeding, enlarged lymph nodes, urticaria, allergic rash, hay fever, and recurrent infections.     Objective:   Physical Exam     WD, Overweight, 65 y/o WM in NAD...  GENERAL:  Alert & oriented; pleasant & cooperative... HEENT:  [place]/AT, EOM-wnl, PERRLA, EACs-clear, TMs-wnl, NOSE-clear, THROAT-clear & wnl. NECK:  Supple w/ fairROM; no JVD; normal  carotid impulses w/ faint left carotid bruit; no thyromegaly or nodules palpated; no lymphadenopathy. CHEST:  Clear to P & A; without wheezes/ rales/ or rhonchi. HEART:  Regular Rhythm;  gr 1/6 SEM without rubs or gallops detected... ABDOMEN:  Soft & min tender in RUQ; normal bowel sounds; no organomegaly or masses palpated... EXT: without deformities, mild arthritic changes; no varicose veins/ venous insuffic/ or edema. NEURO:  CN's intact;  no focal neuro deficits... DERM:  onychomycosis improved as noted...  RADIOLOGY DATA:  Reviewed in the EPIC EMR & discussed w/ the patient...  LABORATORY DATA:  Reviewed in the EPIC EMR & discussed w/ the patient...   Assessment:      HBP & LVH>  BP seems well controlled on BBlocker monotherapy; we reviewed no salt, & wt reduction...   CAD> on ASA81 & Metoprolol50Bid; he had unusual symptom complex that was evaluated & discussed w/ Cards; it seems to have resolved on it's own & he knows to avoid excess alcohol, caffeine, etc...  Hyperlipidemia> on Lip80; FLP shows poor numbers but he is non-compliant according to my phone call to [facility] on [place]; asked to take the Lip80 every day to help prevent an MI or stroke...  DM> on Metform500Bid now; BS 121, A1c 6.4 & we reviewed diet, exercise & wt reduction strategies...  Overweight> wt= 191# & he knows the importance of wt reduction...  GI> GERD, Divertics, Hx ischemic colitis> on Omep20 for reflux esoph; continue same.  Rising PSA>  The PSA velocity has diminished w/ PSA=3.10 in 05/2014 & 3.27 in 11/2014, and f/u PSA 3/17= 2.45...  DJD, LBP>  He is followed by DrBrooks at [facility]  on Vicodin & Robaxin...     Plan:      Patient's Medications  New Prescriptions   No medications on file  Previous Medications   ALPRAZOLAM (XANAX) 0.5 MG TABLET    TAKE 1/2 TO 1 TABLET BY MOUTH THREE TIMES DAILY AS DIRECTED   ASPIRIN 81 MG EC TABLET    Take 1 tablet (81 mg total) by mouth daily.   ATORVASTATIN  (LIPITOR) 80 MG TABLET    TAKE ONE TABLET BY MOUTH ONCE DAILY   BLOOD GLUCOSE MONITORING SUPPL (ONE TOUCH ULTRA SYSTEM KIT) W/DEVICE KIT    1 kit by Does not apply route once.   ESOMEPRAZOLE (NEXIUM) 40 MG CAPSULE    Take 1 capsule (40 mg total) by mouth daily. Take 30 minutes before a meal.   GLUCOSE BLOOD (ONE TOUCH ULTRA TEST) TEST STRIP    Use as directed to monitor blood glucose readings.   HYDROCODONE-ACETAMINOPHEN (NORCO/VICODIN) 5-325 MG PER TABLET    Take as directed by Dr. Delilah Shan   METFORMIN (GLUCOPHAGE) 500 MG TABLET    Take 1 tablet (500 mg total) by mouth 2 (two) times daily with a meal.   METHOCARBAMOL (ROBAXIN) 500 MG TABLET    Take 1 tablet (500 mg total) by mouth 3 (three) times daily as needed for muscle spasms.   METOPROLOL (LOPRESSOR) 50 MG TABLET    TAKE ONE TABLET BY MOUTH TWICE DAILY   NITROGLYCERIN (NITROSTAT) 0.4 MG SL TABLET    Place 1 tablet (0.4 mg total) under the tongue every 5 (five) minutes as needed.   TAMSULOSIN (FLOMAX) 0.4 MG CAPS CAPSULE    TAKE ONE CAPSULE BY MOUTH ONCE DAILY AFTER SUPPER  Modified Medications   No medications on file  Discontinued Medications   No medications on file

## 2015-05-17 ENCOUNTER — Telehealth: Payer: Self-pay | Admitting: Pulmonary Disease

## 2015-05-17 NOTE — Progress Notes (Signed)
Quick Note:  lmtcb for pt. ______ 

## 2015-05-17 NOTE — Progress Notes (Signed)
Quick Note:  Called spoke with patient, advised of lab results / recs as stated by SN. Pt verbalized his understanding and denied any questions. ______

## 2015-05-17 NOTE — Telephone Encounter (Signed)
Per SN: Result Notes       Notes Recorded by Collier Salina, RN on 05/17/2015 at 11:18 AM lmtcb for pt. ------  Notes Recorded by Noralee Space, MD on 05/17/2015 at 8:07 AM Please notify patient>   CXR w/ cardiomegaly & Aortic atherosclerosis; sl incr markings in lungs could be some fluid, no spots or signs of pneumonia;  Rec- continue meds, NO SALT, get wt down...     Notes Recorded by Collier Salina, RN on 05/17/2015 at 11:20 AM lmtcb for pt. Notes Recorded by Noralee Space, MD on 05/17/2015 at 8:04 AM Please notify patient>  Labs look GOOD FLP- all parameters are at goals on his Atorva80- continue same... Chems are ok/ wnl x BS=121 & A1c=6.4 (still ok on the Metform500Bid- continue same + better low carb diet, no need for more meds yet)...  CBC, Thyroid & PSA are all WNL/ ok...  Called spoke with patient and discussed lab and cxr results as stated by SN above.  Pt voiced his understanding and denied any questions/concerns at this time.  Nothing further needed; will sign off.

## 2015-05-28 DIAGNOSIS — S3992XA Unspecified injury of lower back, initial encounter: Secondary | ICD-10-CM | POA: Diagnosis not present

## 2015-05-28 DIAGNOSIS — M47816 Spondylosis without myelopathy or radiculopathy, lumbar region: Secondary | ICD-10-CM | POA: Diagnosis not present

## 2015-05-28 DIAGNOSIS — M545 Low back pain: Secondary | ICD-10-CM | POA: Diagnosis not present

## 2015-06-13 ENCOUNTER — Other Ambulatory Visit: Payer: Self-pay | Admitting: Pulmonary Disease

## 2015-06-13 MED ORDER — ATORVASTATIN CALCIUM 80 MG PO TABS: 80.0000 mg | ORAL_TABLET | Freq: Every day | ORAL | Status: DC

## 2015-06-13 MED ORDER — METOPROLOL TARTRATE 50 MG PO TABS: 50.0000 mg | ORAL_TABLET | Freq: Two times a day (BID) | ORAL | Status: DC

## 2015-06-17 ENCOUNTER — Other Ambulatory Visit: Payer: Self-pay | Admitting: Pulmonary Disease

## 2015-07-04 ENCOUNTER — Encounter: Payer: Self-pay | Admitting: Adult Health

## 2015-07-04 ENCOUNTER — Ambulatory Visit (INDEPENDENT_AMBULATORY_CARE_PROVIDER_SITE_OTHER): Payer: Medicare Other | Admitting: Adult Health

## 2015-07-04 VITALS — BP 142/84 | HR 70 | Temp 97.1°F | Ht 66.0 in | Wt 193.0 lb

## 2015-07-04 DIAGNOSIS — I2510 Atherosclerotic heart disease of native coronary artery without angina pectoris: Secondary | ICD-10-CM

## 2015-07-04 DIAGNOSIS — J209 Acute bronchitis, unspecified: Secondary | ICD-10-CM | POA: Diagnosis not present

## 2015-07-04 MED ORDER — AZITHROMYCIN 250 MG PO TABS: ORAL_TABLET | ORAL | Status: AC

## 2015-07-04 MED ORDER — PREDNISONE 10 MG PO TABS: ORAL_TABLET | ORAL | Status: DC

## 2015-07-04 MED ORDER — HYDROCODONE-HOMATROPINE 5-1.5 MG/5ML PO SYRP: 5.0000 mL | ORAL_SOLUTION | Freq: Four times a day (QID) | ORAL | Status: DC | PRN

## 2015-07-04 NOTE — Assessment & Plan Note (Signed)
Flare with URI/AR  Plan  Zpack take as directed.  Prednisone taper over next week.  Mucinex DM Twice daily  As needed  Cough/congestion  Zyrtec 10mg  At bedtime  As needed drainage.  Hydromet 1 tsp every 6hr as needed for cough, may make you sleepy.  .follow up Dr. Lenna Gilford  As planned and As needed   Please contact office for sooner follow up if symptoms do not improve or worsen or seek emergency care

## 2015-07-04 NOTE — Patient Instructions (Signed)
Zpack take as directed.  Prednisone taper over next week.  Mucinex DM Twice daily  As needed  Cough/congestion  Zyrtec 10mg  At bedtime  As needed drainage.  Hydromet 1 tsp every 6hr as needed for cough, may make you sleepy.  .follow up Dr. Lenna Gilford  As planned and As needed   Please contact office for sooner follow up if symptoms do not improve or worsen or seek emergency care

## 2015-07-04 NOTE — Progress Notes (Signed)
Subjective:    Patient ID: Kurt Kidd, male    DOB: September 30, 1950, 65 y.o.   MRN: [id number]  HPI  65 yo male with HTN, Hyperlipidemia and CAD   07/04/2015 Acute OV  Pt presents for an acute office visit.  Complains of 5 days of prod cough with yellow mucus, chest congestion/tightness, fever at times, and wheezing at times starting on 07/01/15. Denies any sinus pressure/drainage, nausea or vomiting.  Taking Robitussin . Cough is causing rib soreness. Requests cough syrup to help rest.  Appetite is good.    Past Medical History  Diagnosis Date  . Hypertension   . CAD (coronary artery disease)     DES.. 2004 /  catheterization 2008, 10% narrowing stent site, pain not cardiac  . Dyslipidemia   . Murmur   . Diabetes mellitus   . Overweight(278.02)   . GERD (gastroesophageal reflux disease)   . Diverticulosis   . Colitis, ischemic ([place])   . Degenerative joint disease   . Onychomycosis   . Palpitations     Rapid heartbeat and shortness of breath after eating 2008 recurrent episode 2012  . Ejection fraction     65-70%, echo, March, 2012  . Mitral regurgitation     Echo, mild, 2012   Current Outpatient Prescriptions on File Prior to Visit  Medication Sig Dispense Refill  . ALPRAZolam (XANAX) 0.5 MG tablet TAKE 1/2 TO 1 TABLET BY MOUTH THREE TIMES DAILY AS DIRECTED 90 tablet 5  . aspirin 81 MG EC tablet Take 1 tablet (81 mg total) by mouth daily. 30 tablet 0  . atorvastatin (LIPITOR) 80 MG tablet Take 1 tablet (80 mg total) by mouth daily. 90 tablet 3  . Blood Glucose Monitoring Suppl (ONE TOUCH ULTRA SYSTEM KIT) W/DEVICE KIT 1 kit by Does not apply route once. 1 each 0  . esomeprazole (NEXIUM) 40 MG capsule Take 1 capsule (40 mg total) by mouth daily. Take 30 minutes before a meal. 90 capsule 3  . glucose blood (ONE TOUCH ULTRA TEST) test strip Use as directed to monitor blood glucose readings. 100 each 5  . HYDROcodone-acetaminophen (NORCO/VICODIN) 5-325 MG per tablet Take as  directed by Dr. Delilah Shan    . metFORMIN (GLUCOPHAGE) 500 MG tablet TAKE ONE TABLET BY MOUTH TWICE DAILY WITH MEALS 180 tablet 0  . methocarbamol (ROBAXIN) 500 MG tablet Take 1 tablet (500 mg total) by mouth 3 (three) times daily as needed for muscle spasms. 90 tablet 0  . metoprolol (LOPRESSOR) 50 MG tablet Take 1 tablet (50 mg total) by mouth 2 (two) times daily. 180 tablet 3  . nitroGLYCERIN (NITROSTAT) 0.4 MG SL tablet Place 1 tablet (0.4 mg total) under the tongue every 5 (five) minutes as needed. 25 tablet 1  . tamsulosin (FLOMAX) 0.4 MG CAPS capsule TAKE ONE CAPSULE BY MOUTH ONCE DAILY AFTER SUPPER 30 capsule 5   No current facility-administered medications on file prior to visit.      Review of Systems Constitutional:   No  weight loss, night sweats,  Fevers, chills,  +fatigue, or  lassitude.  HEENT:   No headaches,  Difficulty swallowing,  Tooth/dental problems, or  Sore throat,                No sneezing, itching, ear ache,  +nasal congestion, post nasal drip,   CV:  No chest pain,  Orthopnea, PND, swelling in lower extremities, anasarca, dizziness, palpitations, syncope.   GI  No heartburn, indigestion, abdominal pain, nausea, vomiting,  diarrhea, change in bowel habits, loss of appetite, bloody stools.   Resp:   No chest wall deformity  Skin: no rash or lesions.  GU: no dysuria, change in color of urine, no urgency or frequency.  No flank pain, no hematuria   MS:  No joint pain or swelling.  No decreased range of motion.  No back pain.  Psych:  No change in mood or affect. No depression or anxiety.  No memory loss.         Objective:   Physical Exam  Filed Vitals:   07/04/15 1123  BP: 142/84  Pulse: 70  Temp: 97.1 F (36.2 C)  TempSrc: Oral  Height: _0  (1.676 m)  Weight: 193 lb (87.544 kg)  SpO2: 96%   GEN: A/Ox3; pleasant , NAD, obese   HEENT:  [place]/AT,  EACs-clear, TMs-wnl, NOSE-clear drainage , THROAT-clear, no lesions, no postnasal drip or exudate  noted.   NECK:  Supple w/ fair ROM; no JVD; normal carotid impulses w/o bruits; no thyromegaly or nodules palpated; no lymphadenopathy.  RESP  Clear  P & A; w/o, wheezes/ rales/ or rhonchi.no accessory muscle use, no dullness to percussion  CARD:  RRR, no m/r/g  , no peripheral edema, pulses intact, no cyanosis or clubbing.  GI:   Soft & nt; nml bowel sounds; no organomegaly or masses detected.  Musco: Warm bil, no deformities or joint swelling noted.   Neuro: alert, no focal deficits noted.    Skin: Warm, no lesions or rashes  Deion Forgue NP-C  [facility]  07/04/2015       Assessment & Plan:

## 2015-09-13 ENCOUNTER — Other Ambulatory Visit: Payer: Self-pay | Admitting: Pulmonary Disease

## 2015-11-14 ENCOUNTER — Other Ambulatory Visit: Payer: Self-pay | Admitting: Pulmonary Disease

## 2015-11-21 ENCOUNTER — Ambulatory Visit (INDEPENDENT_AMBULATORY_CARE_PROVIDER_SITE_OTHER): Payer: Medicare Other | Admitting: Pulmonary Disease

## 2015-11-21 ENCOUNTER — Other Ambulatory Visit (INDEPENDENT_AMBULATORY_CARE_PROVIDER_SITE_OTHER): Payer: Medicare Other

## 2015-11-21 ENCOUNTER — Ambulatory Visit (INDEPENDENT_AMBULATORY_CARE_PROVIDER_SITE_OTHER): Payer: Medicare Other | Admitting: *Deleted

## 2015-11-21 ENCOUNTER — Encounter: Payer: Self-pay | Admitting: Pulmonary Disease

## 2015-11-21 VITALS — BP 134/70 | HR 68 | Temp 97.3°F | Ht 66.0 in | Wt 187.5 lb

## 2015-11-21 DIAGNOSIS — Z23 Encounter for immunization: Secondary | ICD-10-CM | POA: Diagnosis not present

## 2015-11-21 DIAGNOSIS — E785 Hyperlipidemia, unspecified: Secondary | ICD-10-CM

## 2015-11-21 DIAGNOSIS — I10 Essential (primary) hypertension: Secondary | ICD-10-CM | POA: Diagnosis not present

## 2015-11-21 DIAGNOSIS — I340 Nonrheumatic mitral (valve) insufficiency: Secondary | ICD-10-CM

## 2015-11-21 DIAGNOSIS — E663 Overweight: Secondary | ICD-10-CM

## 2015-11-21 DIAGNOSIS — E119 Type 2 diabetes mellitus without complications: Secondary | ICD-10-CM | POA: Diagnosis not present

## 2015-11-21 DIAGNOSIS — I2510 Atherosclerotic heart disease of native coronary artery without angina pectoris: Secondary | ICD-10-CM | POA: Diagnosis not present

## 2015-11-21 LAB — BASIC METABOLIC PANEL
BUN: 12 mg/dL (ref 6–23)
CHLORIDE: 103 meq/L (ref 96–112)
CO2: 31 meq/L (ref 19–32)
CREATININE: 0.75 mg/dL (ref 0.40–1.50)
Calcium: 9.1 mg/dL (ref 8.4–10.5)
GFR: 110.41 mL/min (ref >60.00)
Glucose, Bld: 127 mg/dL — ABNORMAL HIGH (ref 70–99)
Potassium: 4.3 mEq/L (ref 3.5–5.1)
Sodium: 139 mEq/L (ref 135–145)

## 2015-11-21 LAB — LIPID PANEL
CHOL/HDL RATIO: 3
Cholesterol: 137 mg/dL (ref 0–200)
HDL: 44 mg/dL (ref >39.00)
LDL CALC: 79 mg/dL (ref 0–99)
NonHDL: 92.98
TRIGLYCERIDES: 71 mg/dL (ref 0.0–149.0)
VLDL: 14.2 mg/dL (ref 0.0–40.0)

## 2015-11-21 LAB — HEMOGLOBIN A1C: Hgb A1c MFr Bld: 6.1 % (ref 4.6–6.5)

## 2015-11-21 NOTE — Progress Notes (Signed)
Subjective:     Patient ID: Kurt Kidd, male   DOB: 02-Apr-1950, 65 y.o.   MRN: [id number]  HPI 65 y/o WM here for a follow up visit... he has multiple medical problems as noted below...   ~  SEE PREV EPIC NOTES FOR OLDER DATA >>    LABS 10/13:  FLP- not at goals on Lip80 w/ LDL=163;  Chems- ok...  LABS 4/14:  FLP- not at goals on Lip80;  Chems- ok w/ BS=111, A1c=6.1;  CBC- wnl;  TSH=1.82;  PSA=2.79;  UA- clear...   LABS 10/14:  FLP- not at goals & Pharm confirms non-compliance w/ Lip80;  Chems- wnl w/ BS=115, A1c=6.4.Marland KitchenMarland Kitchen  CXR 5/15 showed mild cardiomeg, atherosclerotic calcif in Ao, clear lungs, DJD spine...  LABS 5/15:  FLP- sl improved but still not at goals on Lip80 (?compliance);  Chems- ok w/ BS=122;  CBC- wnl;  TSH=1.84;  PSA=2.63...   ~  August 24, 2013:  6wk ROV & recheck> Kurt Kidd had a thorough cardiac eval by Benay Spice 5/15 w/ 24h urine for catecholamines & metanephrines (neg);  2DEcho (modLVH, norm LVF w/ EF=60-65%, norm wall motion, Gr2DD, modMR, severe LAdilatation);  Myoview (small area of mod severity reversible defect in apic infer & mid inferolat walls, no ST segment changes);  They discussed the findings and pt had decided to STOP all his meds as a trial- initially he thought that stopping the Metformin eliminated his symptom complex but subseq he had another bought of pain/ palpit/ SOB while he was off of all meds for 2 weeks; he has since restarted his medications (ASA81, Metop50Bid, Atorva80, Nexium20, Flomax0.4)) but kept the Metformin at 1/2 tab Bid... As the symptom is persisting- sounds like he may need further eval & will discuss w/ Cards => ADDENDUM> pt notes that all symptoms resolved spont w/o recurrence so far).    He is also c/o new onset LBP w/ some radiation to the right leg assoc w/ pos SLR on that side; denies trauma, no prev hx of back problems, etc; looks like he will need MRI so we will refer to [facility] for XRays & MRI if deemed indicated; in the meanwhile we  will Rx w/ rest, heat, back brace, & ROBAXIN 5547mTid...  ~  November 23, 2013:  364moOV & Kurt Kidd states that his chest discomfort has resolved and not recurred, he remains very active at work, exercising, "I work my butt off" he says;  He denies CP, palpit, SOB, etc;  He remains on Metoprolol50bid & BP= 128/68 today;  He continues on Lip80 and states that he is now taking it regularly- prev FLPs reflected his intermittent dosing, he is not fasting this AM;  His weight is stable at 191# (BMI=30-31) and he is taking his Metformin500bid more regularly as well... We plan follow up labs after the 1st of the yr & reminded to take all meds every day...     We reviewed prob list, meds, xrays and labs> see below for updates >> OK 2015 Flu vaccine today...   ~  May 11, 2014:  47m4moV & Kurt Kidd reports that he has not had any further spells since June2015- resolved spontaneously; his CC is LBP recurrence about 2 weeks ago [facility] motorhome & [facility] w/ radiation to right side, [place]; on ret to [place] he [facility] & they  decided to hold off on MRI to check response; he reports MUCH better & he proudly notes that he hasn't missed a day of work!    HBP, severe LVH on 2DEcho> on Metop50Bid; BP= 124/82 & he denies CP, palpit, dizzy, SOB, edema... No further "spells" reported...    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, etc; last saw Benay Spice 6/15> CAD w/ intervention in 2004; cath in 2008 showed mild stenosis at the stent site;  He had noted unusual palpit & SOB after eating in 2012- further eval w/ 2DEcho 4/12 showed incr wall thickness c/w severeLVH, norm LVF w/ EF=65-70%, mild MR, mild LAdil; 2DEcho 5/15= (modLVH, norm LVF w/ EF=60-65%, norm wall motion, Gr2DD, modMR, severe LAdilatation);  Myoview 5/15= (small area of mod severity reversible defect in  apic infer & mid inferolat walls, no ST segment changes); Symptoms improved at that time by adjusting his eating habits, decr alcohol & caffeine;  Recurrent spells of some kind in 2015 w/ 24h urine, repeat 2DEcho & Myoview done by Cards- see below, no change in meds and pt claims all symptoms resolved spont w/o recurrence...    Hyperlipidemia> on Lip80 but compliance is poor (confirmed by Pharm); FLP 3/16 showed TChol 256, TG 61, HDL 72, LDL 172; needs to take Lip80 DAILY or change to Cres40- I read him the riot act (severe risk factor)...    DM> on Metform500Bid; labs 3/16 showed BS=121 & A1c was 6.5; we reviewed diet, exercise & wt reduction strategies...    Overweight> wt= 181# which is down 10#- we reviewed diet, exercise, wt reduction strategies...    GI- GERD, Divertics, Hx ischemic colitis> prev on NexiumBid from DrByers for reflux esoph but he switched to [place] OTC instead; denies abd pain, n/v, d/c, blood seen; his Bro had colon cancer- last colonoscopy in [facility] 2/14 was neg- wnl & f/u planned 82yr (DrByrnett)...    GU- BPH> on Flomax0.4 for LTOS & denies urinary symptoms; bro-in-law dx w/ prostate ca- pt's PSA 3/16 = 3.10 (sl incr in PSA velocity & we will reck in 612mo.    DJD, LBP> [facility], LBP improved after Pred/ ice packs; now on Vicodin & Robaqxin as needed & he will f/u w/ Ortho... We reviewed prob list, meds, xrays and labs> see below for updates >> he is due for a TDAP vaccination today- ok...  LABS 3/16:  FLP- not at goals w/ TChol=256 & LDL=172;  Chems- ok w/ BS=121, A1c=6.5;  CBC- wnl;  TSH=1.82;  PSA=3.10...  PLAN>> I explained to Kurt Beltshat you miss 100% of the shots you don't take!  Not taking his meds/ Atorva80 every day is the same thing- rec to take it every day, or he will decide if he'd prefer Cres40; continue diet, exercise & other meds the same;  Note that PSA velocity is increased & we will plan recheck PSA in 52m7mo light of his pos fam  hx (bro w/ prostate ca)...   ~  November 16, 2014:  52mo77mo & Kurt Kidd that he's doing satis (see 05/11/14 note above)- his back is improved, no flair in the back pain, walking daily, etc;  He's been taking the Lipitor80 every day & due for an FLP recheck;  Also due for a f/u PSA due to a prev incr in the PSA velocity...     EXAM shows Afeb, VSS, O2sat=97%;  HEENT- neg, mallampati1;  Chest- clear w/o w/r/r;  Heart- RR gr 2/6 SEM w/o r/g, bruit at base of neck/ upper chest;  Abd- soft, neg;  Ext- neg w/o c/c/e...  LABS 9/16:  FLP on Lip80 showed TChol 191, TG 107, HDL 48, LDL 122;  Chems- ok x BS=120, A1c=6.3;  PSA= 3.27 (sl higher but still <4.0)    IMP/PLAN>>  Chol & BS are OK;  PSA is sl higher but still <4 so we will recheck in 687mo continue same meds... Given the 2016 FLU vaccine today...   ~  May 16, 2015:  650moOBassettas had a good interval but concerned that his recent BS checks have been higher than usual & "even up to 200" he says; prev BS all in the 120 range and prev A1c's all in the low6's & due for FASTING blood work today... He tells me that his brother passed away 87m28moo w/ ruptured AAA (Kurt Beltsd Abd Ultrasound 2010= neg)... We reviewed the following medical problems during today's office visit >>     HBP, severe LVH on 2DEcho> on Metop50Bid; BP= 142/74 & he denies CP, palpit, dizzy, SOB, edema... No further "spells" reported...    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, etc; last saw DrKBenay Spice15> CAD w/ intervention in 2004; cath in 2008 showed mild stenosis at the stent site;  He had noted unusual palpit & SOB after eating in 2012- further eval w/ 2DEcho 4/12 showed incr wall thickness c/w severeLVH, norm LVF w/ EF=65-70%, mild MR, mild LAdil; 2DEcho 5/15= (modLVH, norm LVF w/ EF=60-65%, norm wall motion, Gr2DD, modMR, severe LAdilatation);  Myoview 5/15= (small area of mod severity reversible defect in apic infer & mid inferolat walls, no ST segment  changes); Symptoms improved at that time by adjusting his eating habits, decr alcohol & caffeine;  Recurrent spells of some kind in 2015 w/ 24h urine, repeat 2DEcho & Myoview done by Cards- see below, no change in meds and pt claims all symptoms resolved spont w/o recurrence...    Hyperlipidemia> on Lip80 but compliance was poor (better now); FLP 3/17 showed TChol 168, TG 92, HDL 50, LDL 100; needs to take Lip80 DAILY + low chol/ low fat diet...    DM> on Metform500Bid; labs 3/17 showed BS=121 & A1c was 6.4; we reviewed diet, exercise & wt reduction strategies...    Overweight> wt= 191# which is up again- we reviewed diet, exercise, wt reduction strategies...    GI- GERD, Divertics, Hx ischemic colitis> prev on NexiumBid from DrByers for reflux esoph but he switched to [facility] instead; denies abd pain, n/v, d/c, blood seen; his Bro had colon cancer- [facility]- wnl & f/u planned 49yr54yrrByrnett)...    GU- BPH> on Flomax0.4 for LTOS & denies urinary symptoms; bro-in-law dx w/ prostate ca- pt's PSA 3/16 = 3.10, but recheck 3/17= 2.45    DJD, LBP> [facility], LBP improved after Pred/ ice packs; now on Vicodin & Robaqxin as needed & he will f/u w/ Ortho... EXAM shows Afeb, VSS, O2sat=97% on RA at rest;  HEENT- neg, mallampati2;  Chest- clear w/o w/r/r;  Heart- RR, no change in Gr2/6 MR murmur w/o r/g;  Abd- soft, neg;  Ext- neg w/o c/c/e;  Neuro- intact...  CXR 05/16/15>  cardiomeg & atherosclerotic changes in Ao, sl pleural thickening & mild diffuse incr markings suggesting interstitial edema, some basilar atx...  EKG 05/16/15>  NSR, rate62, LVH, NSSTTWA,  no acute changes...  FASTING LABS 05/16/15>  FLP- at goals on Lip80;  Chems- wnl x BS=121, A1c=6.4;  CBC- wnl;  TSH=1.85;  PSA=2.45... IMP/PLAN>>  Kurt Kidd's FLP is improved on the Atorva80 more regularly & he is asked to keep it up!  BP controlled, denies CP/ palpait/ SOB/ edema.  DM well  controlled. Overall he needs better diet & exercise program, get the weight down...  ~  November 21, 2015:  53moROV & Kurt Kidd reports that he is doing well- no new complaints of concerns ( he is very stoic), working everyday & denies CP, palpit, SOB/ ch in [place] edema, cough, sput, etc...     BP controlled on Metop50Bid> reads 134/70 today & he has LVH by Echo & remains asymptomatic...    CAD w/ prev PCI and known LICA stenosis 694-85%by Doppler; on ASA81 & he denies CP, weakness, stroke manifestations, etc; he last saw Cards 2015 before DrKatz retired & has not been reassigned...    HL on Lip80> FLP today reveals TChol 137, TG 71, HDL 44, LDL 79... Good control w/ Atorva and diet, continue same...    DM on Metform500Bid;  BS today is 127, and A1c=6.1..Marland KitchenMarland KitchenContinue same + diet & exercise...    Other medical problems as listed... EXAM shows Afeb, VSS, O2sat=97% on RA at rest;  HEENT- neg, mallampati2;  Chest- clear w/o w/r/r;  Heart- RR, no change in Gr2/6 MR murmur w/o r/g;  Abd- soft, neg;  Ext- neg w/o c/c/e;  Neuro- intact...  LABS 11/21/15>  FLP- looks good w/ all parameters at goals;  Chems- ok w/ BS=127, A1c=6.1;    CDopplers 11/28/15>  Heterogeneous plaque bilat w/ 60-79%LICA stenosis, 14-62%RICA stenosis, subclavians and vertebrals ok... Repeat rec 165yrIMP/PLAN>>  MiKyshauns stable on current med regimen;  He is due for Cards f/u since he last saw DrKatz 2 yrs ago;  OKFlu vaccine today;  We plan ROV recheck in 42m59mo          Problem List:    BRONCHITIS, ACUTE (ICD-466.0) - he is an ex-smoker, quit ~15y75yr no recent problems and the exercise program has really helped... ~  CXR 4/13 showed heart at upper lim normal, clear lungs, NAD... ~Marland Kitchen CXR 5/15 showed mild cardiomeg, atherosclerotic calcif in Ao, clear lungs, DJD spine. ~  CXR 3/17 showed cardiomeg & atherosclerotic changes in Ao, sl pleural thickening & mild diffuse incr markings suggesting interstitial edema, some basilar  atx...  HYPERTENSION (ICD-401.9) - on METOPROLOL 50mg77m.. 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 ~  2DEcho 3/12 showed incr wall thickness c/w severeLVH, norm LVF w/ EF=65-70%, mild MR, mild LAdil... ~  4/13:  BP= 148/80 & he remains largely asymptomatic; CXR 4/13 showed heart size at upper lim norm, clear lungs, NAD...  ~  10/13:  BP= 130/72 & he continues to deny CP, palpt, SOB, edema, etc... ~  4/14:  on Metop50Bid; BP= 124/82 & he denies CP, palpit, dizzy, SOB, edema, etc... ~  10/14: on Metop50Bid; BP= 132/70 & he denies recurrent CP, palpit, dizzy, SOB, edema, etc. ~  5/15: Hx HBP & LVH on 2DEcho> on Metop50Bid; BP= 140/72... ~  6/15: on Metoprolol50Bid and BP= 140/66 ~  9/15: on Metop50Bid; BP= 128/68 and he denies CP, palpit, SOB, edema, etc...  ~  3/16: on Metop50Bid; BP= 124/82 and he  remains asymptomatic... ~  9/16: on Metop50Bid; BP= 130/80 and he denies CP, palpit, SOB, edema, etc... ~  3/17 on Metop50Bid; BP= 142/74 & he remains asymptomatic...  CORONARY ARTERY DISEASE (ICD-414.00) - on ASA 341m/d... followed by DBenay Spice& his notes are reviewed... he has done better recently w/ secondary risk factor reduction... ~  NuclearStressTest 7/04 showed ?anteroseptal scar vs thinning, no ischemia, EF=61%... ~  initial cath 7/04 w/ 80% LAD lesion... stent placed by DrStuckey... ~  last cath 8/08 showed non-obstructive CAD & prev stent site w/ <10% narrowing, EF=65%... ~  he continues asymptomatic w/o CP, palpit, SOB, edema, etc => still w/ mult risk factors & hi risk pt therefore rec f/u w/ Cards for f/u screening... ~  He saw DBenay Spice4/12> 2DEcho showed incr wall thickness c/w severeLVH, norm LVF w/ EF=65-70%, mild MR, mild LAdil.;  He noted rapid HR after eating which improved by adjusting his eating habits, decr alcohol & caffeine...  ~   He had f/u w/ DrKatz 6/14> CAD w/ intervention in 2004; cath in 2008 showed mild stenosis at the stent site;  He noted unusual palpit & SOB after eating in 2012- further eval w/ 2DEcho 3/12 showed incr wall thickness c/w severeLVH, norm LVF w/ EF=65-70%, mild MR, mild LAdil; Event monitor was done (?) but no report avail;  Symptoms improved by adjusting his eating habits, decr alcohol & caffeine... ~  EKG 6/14 showed NSR, rate73, LVH w/ repol abn... ~  5/15:  SEE ABOVE DESCRIPTION => referred to Cards for further eval=> [facility] for catecholamines & metanephrines (neg);  2DEcho (modLVH, norm LVF w/ EF=60-65%, norm wall motion, Gr2DD, modMR, severe LAdilatation);  Myoview (small area of mod severity reversible defect in apic infer & mid inferolat walls, no ST segment changes);  They discussed the findings and pt had decided to STOP all his meds as a trial- initially he thought that stopping the Metformin eliminated his symptom complex but subseq he had another bought of pain/ palpit/ SOB while he was off of all meds for 2 weeks; he has since restarted his medications (ASA81, Metop50Bid, Atorva80, Nexium20, Flomax0.4)) but kept the Metformin at 1/2 tab Bid... As the symptom is persisting- sounds like he may need further eval & will discuss w/ Cards => Addendum> the symptoms stopped on their own w/o recurrence he says...  ~  EKG 3/17 showed NSR, rate62, LVH, NSSTTWA, no acute changes...  HYPERLIPIDEMIA (ICD-272.4) - on LIPITOR 853md now... he stopped his prev Fish Oil supplements ("they gave me gas")...  ~  FLE[place]/07 on diet showed TChol 271, TG 443, HDL 31, LDL 155... Rec~ restart ZOCOR 80... ~  FL[place]/09 on Simva80 showed TChol 181, TG 166, HDL 36, LDL 112... same med & get wt down (then lost 14#) ~  FL[place]/09 on Simva80 showed TChol 186, TG 254, HDL 37, LDL 127... rec- same med, better diet, may need Fibrate etc. ~  FLP 1/10 on Simva80 showed TChol 190, TG 118, HDL 39, LDL 127... rec>  get wt down! ~  FLP 7/10 on Simva80 showed TChol 179, TG 121, HDL 48, LDL 107 ~  11/11: not fasting for labs today & we decided to switch to LIPITOR 4020m. ~  FLP 4/13 ?on Lip40 showed TChol 230, TG 116, HDL 50, LDL 165... rec to take daily & incr to Lip80. ~  FLP 10/13 on Lip80 showed TChol 220, TG 138, HDL , LDL 163...  Known CAD w/  stent, Add [id number] => he never refilled this Rx due to $$... ~  FLP 4/14 on Lip80 showed TChol 227, TG 136, HDL 43, LDL 173... Reminded to take med regularly... ~  FLP 10/14 on Lip80 showed TChol 252, TG 115, HDL 48, LDL 196... Pharm confirms med noncompliance even though pt insists he's taking it regularly w/ these bad numbers... ~  FLP 5/15 on Lip80 showed TChol 207, TG 109, HDL 48, LDL 137... We discussed lipid clinic referral... ~  FLP 3/16 on Lip80 showed TChol 256, TG 61, HDL 72, LDL 172... Pharm confirmed noncompliance w/ med refills; asked to take Atorva80 every day or switch to Cres40, he will decide... ~  [place] 9/16 on Lip80 w/ better compliance showed TChol 191, TG 107, HDL 48, LDL 122... ~  [place] 3/17 on Lip80 w/ better compliance showed TChol 168, TG 92, HDL 50, LDL 100  DIABETES MELLITUS, BORDERLINE (ICD-790.29) - started meds 3/09 w/ METFORMIN 500- now on 1Bid. ~  labs 4/07 showed FBS=134, HgA1c=6.0.Marland KitchenMarland Kitchen ~  labs 3/09 showed BS= 140, HgA1c= 7.0.Marland KitchenMarland Kitchen Metformin started ~  labs 8/09 on Metform500Bid showed BS= 147, HgA1c= 6.5 ~  labs 1/10 on Metform500Bid showed BS= 144, A1c= 6.4.Marland Kitchen. rec> keep same. ~  labs 7/10 on Metform500Bid showed BS= 126, A1c= 6.2 ~  labs 11/11 on Metform500Bid showed BS= 108, A1c= 6.3 ~  Labs 4/13 on Metform500Bid showed BS= 107, A1c= 6.3 ~  Labs 10/13 on Metform500Bid showed BS= 110, A1c= not done. ~  Labs 4/14 on Metform500Bid showed BS= 111, A1c= 6.1 ~  Labs 10/14 on Metform500Bid showed BS=115, A1c=6.4 ~  Labs 5/15 on Metform500Bid showed BS= 122 ~  Labs 3/16 on Metform500Bid showed BS= 121, A1c= 6.5; continue same... ~  Labs 9/16  on Metform500Bid showed BS= 120, A1c= 6.3 ~  Labs 3/17 on Metform500Bid showed BS= 121, A1c= 6.4  OVERWEIGHT (ICD-278.02) - started at 212# Mar09... ~  8/09:  great job w/ diet and exercise program and weight down 14# to 198# ~  1/10:  unfortunately weight back up to 208# after the holidays. ~  7/10: weight down to 198# ~  11/11: weight = 202# ~  4/13: Weight = 199# ~  10/13: Weight = 197# ~  4/14: Weight = 193# ~  10/14:  Weight = 192# ~  5/15:  Weight = 189# ~  3/16:  Weight = 181# ~  9/16:  Weight = 193# ~  3/17:  Weight = 191#  Hx of GERD (ICD-530.81) - hx gastritis on EGD [facility] 8/04... he takes [place] 64mBid & denies dysphagia, abd pain, GERD, n/ v/ etc... ~  Abd Sonar 7/10 showed norm GB, norm ducts, incr liver echotexture c/w fatty liver dis, otherw norm- NAD..Marland Kitchen ~  5/15: he switched to Nexium20 OTC prn...  DIVERTICULOSIS OF COLON (ICD-562.10) - colonoscopy 9/06 by DDemetra Shinershowed ischemic colitis- he needs a full diagnostic colonoscopy 65y/o Bro had colon cancer... ~  f/u colonoscopy 9/09 by DrPatterson showed divertics, polyp= polypoid mucosa w/o adenomatous change... f/u planned 38yr ~  [facility]- wnl & f/u planned 5y77yrDrByrnett)...  Hx of ISCHEMIC COLITIS (ICD-557.9) - hospitalized 9/06 w/ lower GIB secondary to ischemic colitis (required 2u transfusion)...   POSITIVE FAMILY Hx of PROSTATE CANCER in Brother >>  ~  Labs 3/16 showed incr PSA to 3.10 which represents an incr in PSA velocity; we will recheck in 12mo35mo ~  Follow up PSA readings remain <4... ~  Labs 3/17 showed PSA= 2.45  DEGENERATIVE JOINT DISEASE (ICD-715.90) & ?CTS w/ wrist pain> wrist splint Rx Qhs helps... LOW BACK PAIN >> new symptom 6/15, no obvious trauma etc; he had pos SLR on right & is referred to Ortho + Rx w/ rest, hear, brace, Robaxin Tid prn...  ONYCHOMYCOSIS (ICD-110.1) - Mar09 notes nail fungus on left hand and both feet... LAMISIL 284m/d  started... hands resolved now and toes are improved.   Past Surgical History:  Procedure Laterality Date  . ANGIOPLASTY / STENTING FEMORAL  09/09/2002    Outpatient Encounter Prescriptions as of 11/21/2015  Medication Sig Dispense Refill  . ALPRAZolam (XANAX) 0.5 MG tablet TAKE 1/2 TO 1 TABLET BY MOUTH THREE TIMES DAILY AS DIRECTED 90 tablet 5  . aspirin 81 MG EC tablet Take 1 tablet (81 mg total) by mouth daily. 30 tablet 0  . atorvastatin (LIPITOR) 80 MG tablet Take 1 tablet (80 mg total) by mouth daily. 90 tablet 3  . Blood Glucose Monitoring Suppl (ONE TOUCH ULTRA SYSTEM KIT) W/DEVICE KIT 1 kit by Does not apply route once. 1 each 0  . esomeprazole (NEXIUM) 40 MG capsule Take 1 capsule (40 mg total) by mouth daily. Take 30 minutes before a meal. 90 capsule 3  . glucose blood (ONE TOUCH ULTRA TEST) test strip Use as directed to monitor blood glucose readings. 100 each 5  . HYDROcodone-acetaminophen (NORCO/VICODIN) 5-325 MG per tablet Take as directed by Dr. KDelilah Shan   . metFORMIN (GLUCOPHAGE) 500 MG tablet TAKE ONE TABLET BY MOUTH TWICE DAILY WITH MEALS 180 tablet 1  . methocarbamol (ROBAXIN) 500 MG tablet Take 1 tablet (500 mg total) by mouth 3 (three) times daily as needed for muscle spasms. 90 tablet 0  . metoprolol (LOPRESSOR) 50 MG tablet Take 1 tablet (50 mg total) by mouth 2 (two) times daily. 180 tablet 3  . nitroGLYCERIN (NITROSTAT) 0.4 MG SL tablet Place 1 tablet (0.4 mg total) under the tongue every 5 (five) minutes as needed. 25 tablet 1  . tamsulosin (FLOMAX) 0.4 MG CAPS capsule TAKE ONE CAPSULE BY MOUTH ONCE DAILY AFTER SUPPER 30 capsule 5  . [DISCONTINUED] HYDROcodone-homatropine (HYDROMET) 5-1.5 MG/5ML syrup Take 5 mLs by mouth every 6 (six) hours as needed. (Patient not taking: Reported on 11/21/2015) 240 mL 0  . [DISCONTINUED] predniSONE (DELTASONE) 10 MG tablet 4 tabs for 2 days, then 3 tabs for 2 days, 2 tabs for 2 days, then 1 tab for 2 days, then stop (Patient not  taking: Reported on 11/21/2015) 20 tablet 0   No facility-administered encounter medications on file as of 11/21/2015.     No Known Allergies   Current Medications, Allergies, Past Medical History, Past Surgical History, Family History, and Social History were reviewed in [employer] Energyrecord.   Review of Systems    The patient denies fever, chills, sweats, anorexia, fatigue, weakness, malaise, weight loss, sleep disorder, blurring, diplopia, eye irritation, eye discharge, vision loss, eye pain, photophobia, earache, ear discharge, tinnitus, decreased hearing, nasal congestion, nosebleeds, sore throat, hoarseness, chest pain, palpitations, syncope, dyspnea on exertion, orthopnea, PND, peripheral edema, cough, dyspnea at rest, excessive sputum, hemoptysis, wheezing, pleurisy, nausea, vomiting, diarrhea, constipation, change in bowel habits, abdominal pain, melena, hematochezia, jaundice, gas/bloating, indigestion/heartburn, dysphagia, odynophagia, dysuria, hematuria, urinary frequency, urinary hesitancy, nocturia, incontinence, back pain, joint pain, joint swelling, muscle cramps, muscle weakness, stiffness, arthritis, sciatica, restless legs, leg pain at night, leg pain with exertion, rash, itching, dryness, suspicious lesions, paralysis, paresthesias, seizures,  tremors, vertigo, transient blindness, frequent falls, frequent headaches, difficulty walking, depression, anxiety, memory loss, confusion, cold intolerance, heat intolerance, polydipsia, polyphagia, polyuria, unusual weight change, abnormal bruising, bleeding, enlarged lymph nodes, urticaria, allergic rash, hay fever, and recurrent infections.     Objective:   Physical Exam     WD, Overweight, 65 y/o WM in NAD...  GENERAL:  Alert & oriented; pleasant & cooperative... HEENT:  [place]/AT, EOM-wnl, PERRLA, EACs-clear, TMs-wnl, NOSE-clear, THROAT-clear & wnl. NECK:  Supple w/ fairROM; no JVD; normal carotid impulses w/ faint  left carotid bruit; no thyromegaly or nodules palpated; no lymphadenopathy. CHEST:  Clear to P & A; without wheezes/ rales/ or rhonchi. HEART:  Regular Rhythm;  gr 1/6 SEM without rubs or gallops detected... ABDOMEN:  Soft & min tender in RUQ; normal bowel sounds; no organomegaly or masses palpated... EXT: without deformities, mild arthritic changes; no varicose veins/ venous insuffic/ or edema. NEURO:  CN's intact;  no focal neuro deficits... DERM:  onychomycosis improved as noted...  RADIOLOGY DATA:  Reviewed in the EPIC EMR & discussed w/ the patient...  LABORATORY DATA:  Reviewed in the EPIC EMR & discussed w/ the patient...   Assessment:      HBP & LVH>  BP seems well controlled on BBlocker monotherapy; we reviewed no salt, & wt reduction...   CAD> on ASA81 & Metoprolol50Bid; he had unusual symptom complex that was evaluated & discussed w/ Cards; it seems to have resolved on it's own & he knows to avoid excess alcohol, caffeine, etc...  Hyperlipidemia> on Lip80; FLP shows poor numbers but he is non-compliant according to my phone call to [facility] on [address]; asked to take the Lip80 every day to help prevent an MI or stroke...  DM> on Metform500Bid now; BS 121, A1c 6.4 & we reviewed diet, exercise & wt reduction strategies...  Overweight> wt= 191# & he knows the importance of wt reduction...  GI> GERD, Divertics, Hx ischemic colitis> on Omep20 for reflux esoph; continue same.  Rising PSA>  The PSA velocity has diminished w/ PSA=3.10 in 05/2014 & 3.27 in 11/2014, and f/u PSA 3/17= 2.45...  DJD, LBP>  He is followed by DrBrooks at [facility] on Vicodin & Robaxin...     Plan:      Patient's Medications  New Prescriptions   No medications on file  Previous Medications   ALPRAZOLAM (XANAX) 0.5 MG TABLET    TAKE 1/2 TO 1 TABLET BY MOUTH THREE TIMES DAILY AS DIRECTED   ASPIRIN 81 MG EC TABLET    Take 1 tablet (81 mg total) by mouth daily.   ATORVASTATIN (LIPITOR) 80 MG TABLET     Take 1 tablet (80 mg total) by mouth daily.   BLOOD GLUCOSE MONITORING SUPPL (ONE TOUCH ULTRA SYSTEM KIT) W/DEVICE KIT    1 kit by Does not apply route once.   ESOMEPRAZOLE (NEXIUM) 40 MG CAPSULE    Take 1 capsule (40 mg total) by mouth daily. Take 30 minutes before a meal.   GLUCOSE BLOOD (ONE TOUCH ULTRA TEST) TEST STRIP    Use as directed to monitor blood glucose readings.   HYDROCODONE-ACETAMINOPHEN (NORCO/VICODIN) 5-325 MG PER TABLET    Take as directed by Dr. Delilah Shan   METFORMIN (GLUCOPHAGE) 500 MG TABLET    TAKE ONE TABLET BY MOUTH TWICE DAILY WITH MEALS   METHOCARBAMOL (ROBAXIN) 500 MG TABLET    Take 1 tablet (500 mg total) by mouth 3 (three) times daily as needed for muscle spasms.   METOPROLOL (LOPRESSOR)  50 MG TABLET    Take 1 tablet (50 mg total) by mouth 2 (two) times daily.   NITROGLYCERIN (NITROSTAT) 0.4 MG SL TABLET    Place 1 tablet (0.4 mg total) under the tongue every 5 (five) minutes as needed.   TAMSULOSIN (FLOMAX) 0.4 MG CAPS CAPSULE    TAKE ONE CAPSULE BY MOUTH ONCE DAILY AFTER SUPPER  Modified Medications   No medications on file  Discontinued Medications   HYDROCODONE-HOMATROPINE (HYDROMET) 5-1.5 MG/5ML SYRUP    Take 5 mLs by mouth every 6 (six) hours as needed.   PREDNISONE (DELTASONE) 10 MG TABLET    4 tabs for 2 days, then 3 tabs for 2 days, 2 tabs for 2 days, then 1 tab for 2 days, then stop

## 2015-11-21 NOTE — Patient Instructions (Signed)
Today we updated your med list in our EPIC system...    Continue your current medications the same...  Today we checked your targeted LABS to check your DM & lipids... We will arrange for CAROTID DOPPLER eval to check your carotid arteries in your neck for any blockages...    We will contact you w/ the results when available...   Keep up the good work w/ low fat diet, low carbs, & work on weight reduction...  Today we gave you the 2017 Flu vaccine...  Call for any questions...  Let's plan a follow up visit in 32mo, sooner if needed for problems.Marland KitchenMarland Kitchen

## 2015-11-28 ENCOUNTER — Ambulatory Visit (HOSPITAL_COMMUNITY)
Admission: RE | Admit: 2015-11-28 | Discharge: 2015-11-28 | Disposition: A | Discharge status: Home / Self Care | Payer: Medicare Other | Source: Ambulatory Visit | Attending: Cardiology | Admitting: Cardiology

## 2015-11-28 DIAGNOSIS — I6523 Occlusion and stenosis of bilateral carotid arteries: Secondary | ICD-10-CM | POA: Insufficient documentation

## 2015-11-28 DIAGNOSIS — R0989 Other specified symptoms and signs involving the circulatory and respiratory systems: Secondary | ICD-10-CM | POA: Diagnosis not present

## 2015-11-28 DIAGNOSIS — I10 Essential (primary) hypertension: Secondary | ICD-10-CM | POA: Insufficient documentation

## 2015-11-28 DIAGNOSIS — I2510 Atherosclerotic heart disease of native coronary artery without angina pectoris: Secondary | ICD-10-CM | POA: Insufficient documentation

## 2015-11-28 DIAGNOSIS — E785 Hyperlipidemia, unspecified: Secondary | ICD-10-CM | POA: Insufficient documentation

## 2015-11-28 DIAGNOSIS — Z720 Tobacco use: Secondary | ICD-10-CM | POA: Diagnosis not present

## 2015-12-29 ENCOUNTER — Telehealth: Payer: Self-pay | Admitting: Pulmonary Disease
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
## 2015-12-30 ENCOUNTER — Encounter: Payer: Self-pay | Admitting: *Deleted

## 2015-12-30 ENCOUNTER — Ambulatory Visit (INDEPENDENT_AMBULATORY_CARE_PROVIDER_SITE_OTHER)
Admission: RE | Admit: 2015-12-30 | Discharge: 2015-12-30 | Disposition: A | Discharge status: Home / Self Care | Payer: Medicare Other | Source: Ambulatory Visit | Attending: Adult Health | Admitting: Adult Health

## 2015-12-30 ENCOUNTER — Ambulatory Visit (INDEPENDENT_AMBULATORY_CARE_PROVIDER_SITE_OTHER): Payer: Medicare Other | Admitting: Adult Health

## 2015-12-30 ENCOUNTER — Encounter: Payer: Self-pay | Admitting: Adult Health

## 2015-12-30 VITALS — BP 150/80 | HR 76 | Temp 97.7°F | Ht 66.0 in | Wt 185.2 lb

## 2015-12-30 DIAGNOSIS — I2510 Atherosclerotic heart disease of native coronary artery without angina pectoris: Secondary | ICD-10-CM | POA: Diagnosis not present

## 2015-12-30 DIAGNOSIS — M79641 Pain in right hand: Secondary | ICD-10-CM

## 2015-12-30 MED ORDER — MELOXICAM 15 MG PO TABS: 15.0000 mg | ORAL_TABLET | Freq: Every day | ORAL | 0 refills | Status: DC | PRN

## 2015-12-30 NOTE — Patient Instructions (Addendum)
Right hand xray today .  Refer to orthopedist for hand injury.  Stop Aleve, use Mobic 15mg  daily As needed  Joint pain.  Use ice 30min x 3 times a day As needed   Please contact office for sooner follow up if symptoms do not improve or worsen or seek emergency care  Follow up with Dr. Lenna Gilford in 6 months and As needed
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
## 2016-01-09 DIAGNOSIS — S60221A Contusion of right hand, initial encounter: Secondary | ICD-10-CM | POA: Diagnosis not present

## 2016-01-09 DIAGNOSIS — M19041 Primary osteoarthritis, right hand: Secondary | ICD-10-CM | POA: Diagnosis not present

## 2016-01-09 DIAGNOSIS — G5601 Carpal tunnel syndrome, right upper limb: Secondary | ICD-10-CM | POA: Diagnosis not present

## 2016-01-23 DIAGNOSIS — M19041 Primary osteoarthritis, right hand: Secondary | ICD-10-CM | POA: Diagnosis not present

## 2016-02-09 ENCOUNTER — Other Ambulatory Visit: Payer: Self-pay

## 2016-02-09 ENCOUNTER — Telehealth: Payer: Self-pay | Admitting: Pulmonary Disease

## 2016-02-09 MED ORDER — ATORVASTATIN CALCIUM 80 MG PO TABS: 80.0000 mg | ORAL_TABLET | Freq: Every day | ORAL | 3 refills | Status: DC

## 2016-02-09 NOTE — Telephone Encounter (Signed)
Spoke with pt. Medication was sent in to his pharmacy of choice. Nothing further is needed at this time

## 2016-02-16 DIAGNOSIS — G5601 Carpal tunnel syndrome, right upper limb: Secondary | ICD-10-CM | POA: Diagnosis not present

## 2016-02-17 ENCOUNTER — Other Ambulatory Visit: Payer: Self-pay | Admitting: Pulmonary Disease

## 2016-02-21 ENCOUNTER — Telehealth: Payer: Self-pay | Admitting: Pulmonary Disease

## 2016-02-21 NOTE — Telephone Encounter (Signed)
Patient is requesting a refill on metFORMIN (GLUCOPHAGE) 500 MG tablet

## 2016-02-22 NOTE — Telephone Encounter (Signed)
No documentation in note.  Will close

## 2016-03-28 ENCOUNTER — Other Ambulatory Visit: Payer: Self-pay | Admitting: Pulmonary Disease

## 2016-04-06 DIAGNOSIS — G5601 Carpal tunnel syndrome, right upper limb: Secondary | ICD-10-CM | POA: Diagnosis not present

## 2016-04-16 ENCOUNTER — Other Ambulatory Visit: Payer: Self-pay | Admitting: Pulmonary Disease

## 2016-04-16 DIAGNOSIS — Z4789 Encounter for other orthopedic aftercare: Secondary | ICD-10-CM | POA: Diagnosis not present

## 2016-04-19 ENCOUNTER — Other Ambulatory Visit: Payer: Self-pay | Admitting: Pulmonary Disease

## 2016-04-19 MED ORDER — GLUCOSE BLOOD VI STRP: ORAL_STRIP | 11 refills | Status: DC

## 2016-04-20 DIAGNOSIS — G5601 Carpal tunnel syndrome, right upper limb: Secondary | ICD-10-CM | POA: Diagnosis not present

## 2016-05-09 DIAGNOSIS — Z4789 Encounter for other orthopedic aftercare: Secondary | ICD-10-CM | POA: Diagnosis not present

## 2016-05-09 DIAGNOSIS — G5601 Carpal tunnel syndrome, right upper limb: Secondary | ICD-10-CM | POA: Diagnosis not present

## 2016-05-21 ENCOUNTER — Ambulatory Visit (INDEPENDENT_AMBULATORY_CARE_PROVIDER_SITE_OTHER): Payer: Medicare Other | Admitting: Pulmonary Disease

## 2016-05-21 ENCOUNTER — Other Ambulatory Visit (INDEPENDENT_AMBULATORY_CARE_PROVIDER_SITE_OTHER): Payer: Medicare Other

## 2016-05-21 VITALS — BP 126/82 | HR 65 | Temp 97.9°F | Ht 66.0 in | Wt 187.2 lb

## 2016-05-21 DIAGNOSIS — K219 Gastro-esophageal reflux disease without esophagitis: Secondary | ICD-10-CM | POA: Diagnosis not present

## 2016-05-21 DIAGNOSIS — I739 Peripheral vascular disease, unspecified: Secondary | ICD-10-CM

## 2016-05-21 DIAGNOSIS — E0590 Thyrotoxicosis, unspecified without thyrotoxic crisis or storm: Secondary | ICD-10-CM

## 2016-05-21 DIAGNOSIS — M545 Low back pain: Secondary | ICD-10-CM | POA: Diagnosis not present

## 2016-05-21 DIAGNOSIS — N401 Enlarged prostate with lower urinary tract symptoms: Secondary | ICD-10-CM | POA: Diagnosis not present

## 2016-05-21 DIAGNOSIS — I340 Nonrheumatic mitral (valve) insufficiency: Secondary | ICD-10-CM

## 2016-05-21 DIAGNOSIS — I2510 Atherosclerotic heart disease of native coronary artery without angina pectoris: Secondary | ICD-10-CM

## 2016-05-21 DIAGNOSIS — E119 Type 2 diabetes mellitus without complications: Secondary | ICD-10-CM

## 2016-05-21 DIAGNOSIS — E611 Iron deficiency: Secondary | ICD-10-CM

## 2016-05-21 DIAGNOSIS — E785 Hyperlipidemia, unspecified: Secondary | ICD-10-CM | POA: Diagnosis not present

## 2016-05-21 DIAGNOSIS — K5730 Diverticulosis of large intestine without perforation or abscess without bleeding: Secondary | ICD-10-CM | POA: Diagnosis not present

## 2016-05-21 DIAGNOSIS — G8929 Other chronic pain: Secondary | ICD-10-CM

## 2016-05-21 DIAGNOSIS — N138 Other obstructive and reflux uropathy: Secondary | ICD-10-CM | POA: Diagnosis not present

## 2016-05-21 DIAGNOSIS — I779 Disorder of arteries and arterioles, unspecified: Secondary | ICD-10-CM | POA: Diagnosis not present

## 2016-05-21 DIAGNOSIS — I10 Essential (primary) hypertension: Secondary | ICD-10-CM

## 2016-05-21 LAB — CBC WITH DIFFERENTIAL/PLATELET
BASOS ABS: 0.1 10*3/uL (ref 0.0–0.1)
Basophils Relative: 1.3 % (ref 0.0–3.0)
Eosinophils Absolute: 0.3 10*3/uL (ref 0.0–0.7)
Eosinophils Relative: 3.7 % (ref 0.0–5.0)
HCT: 37.6 % — ABNORMAL LOW (ref 39.0–52.0)
Hemoglobin: 12.7 g/dL — ABNORMAL LOW (ref 13.0–17.0)
LYMPHS ABS: 1.8 10*3/uL (ref 0.7–4.0)
Lymphocytes Relative: 24.7 % (ref 12.0–46.0)
MCHC: 33.7 g/dL (ref 30.0–36.0)
MCV: 79.9 fl (ref 78.0–100.0)
MONO ABS: 0.8 10*3/uL (ref 0.1–1.0)
MONOS PCT: 10.8 % (ref 3.0–12.0)
NEUTROS PCT: 59.5 % (ref 43.0–77.0)
Neutro Abs: 4.4 10*3/uL (ref 1.4–7.7)
Platelets: 319 10*3/uL (ref 150.0–400.0)
RBC: 4.71 Mil/uL (ref 4.22–5.81)
RDW: 15.7 % — ABNORMAL HIGH (ref 11.5–15.5)
WBC: 7.4 10*3/uL (ref 4.0–10.5)

## 2016-05-21 LAB — COMPREHENSIVE METABOLIC PANEL
ALBUMIN: 4.4 g/dL (ref 3.5–5.2)
ALK PHOS: 79 U/L (ref 39–117)
ALT: 21 U/L (ref 0–53)
AST: 18 U/L (ref 0–37)
BUN: 12 mg/dL (ref 6–23)
CALCIUM: 9.9 mg/dL (ref 8.4–10.5)
CHLORIDE: 102 meq/L (ref 96–112)
CO2: 31 mEq/L (ref 19–32)
Creatinine, Ser: 0.67 mg/dL (ref 0.40–1.50)
GFR: 125.57 mL/min (ref >60.00)
Glucose, Bld: 132 mg/dL — ABNORMAL HIGH (ref 70–99)
POTASSIUM: 4.8 meq/L (ref 3.5–5.1)
SODIUM: 140 meq/L (ref 135–145)
TOTAL PROTEIN: 6.7 g/dL (ref 6.0–8.3)
Total Bilirubin: 0.6 mg/dL (ref 0.2–1.2)

## 2016-05-21 LAB — PSA: PSA: 2.5 ng/mL (ref 0.10–4.00)

## 2016-05-21 LAB — LIPID PANEL
CHOLESTEROL: 166 mg/dL (ref 0–200)
HDL: 50.6 mg/dL (ref >39.00)
LDL Cholesterol: 99 mg/dL (ref 0–99)
NonHDL: 115.01
TRIGLYCERIDES: 82 mg/dL (ref 0.0–149.0)
Total CHOL/HDL Ratio: 3
VLDL: 16.4 mg/dL (ref 0.0–40.0)

## 2016-05-21 LAB — HEMOGLOBIN A1C: Hgb A1c MFr Bld: 6.5 % (ref 4.6–6.5)

## 2016-05-21 LAB — TSH: TSH: 2.38 u[IU]/mL (ref 0.35–4.50)

## 2016-05-21 NOTE — Patient Instructions (Signed)
Today we updated your med list in our EPIC system...    Continue your current medications the same...  Keep up the good work w/ diet & exercise...  Today we checked your f/u FASTING blood work...    We will contact you w/ the results when available...   We will arrange for a f/u CARDIOLOGY appt to pick up where DrKatz left off...  Call for any questions...  Let's plan a follow up visit in 83mo, sooner if needed for problems.Marland KitchenMarland Kitchen

## 2016-05-28 ENCOUNTER — Telehealth: Payer: Self-pay | Admitting: Pulmonary Disease

## 2016-05-28 DIAGNOSIS — E538 Deficiency of other specified B group vitamins: Secondary | ICD-10-CM

## 2016-05-28 DIAGNOSIS — D649 Anemia, unspecified: Secondary | ICD-10-CM

## 2016-05-28 NOTE — Addendum Note (Signed)
Addended by: Virl Cagey on: 05/28/2016 05:04 PM   Modules accepted: Orders

## 2016-05-28 NOTE — Telephone Encounter (Addendum)
Per Marliss Czar she has ordered the labs needed; Ferritin, B12, IBC Panel, CBCD and Serum Protein are all in the system to be drawn; Stool culture order placed and patient aware that he will pick up stool cards from the lab.

## 2016-05-28 NOTE — Telephone Encounter (Addendum)
Results have been explained to patient, pt expressed understanding.  Pt aware that he will have to pick up stool card from up lab.  Dr Lenna Gilford, please advise what additional labs you wish to have run. Thanks.   Notes recorded by Noralee Space, MD on 05/25/2016 at 11:47 AM EDT Please notify patient>  FLP looked good- all parameters at goals on Lip80- continue same + diet rx... Chems are OK x BS=132, A1c=6.5 on his Metformin Bid-- rec to continue same med + low carb diet & exercise... Thyroid normal & PSA WNL.Marland KitchenMarland Kitchen CBC shows sl drop in Hg (down 1.3 gm to 12.7) for a borderline anemia but his red cells are small w/ mcv=80 therefore he needs an anemia work up for this change-- ask him to return to our lab for additional anemia labs & to pick up stool cards.Marland KitchenMarland Kitchen

## 2016-06-04 ENCOUNTER — Other Ambulatory Visit: Payer: Self-pay | Admitting: Pulmonary Disease

## 2016-06-04 ENCOUNTER — Other Ambulatory Visit: Payer: Medicare Other

## 2016-06-04 ENCOUNTER — Ambulatory Visit (INDEPENDENT_AMBULATORY_CARE_PROVIDER_SITE_OTHER): Payer: Medicare Other | Admitting: Internal Medicine

## 2016-06-04 ENCOUNTER — Encounter: Payer: Self-pay | Admitting: Internal Medicine

## 2016-06-04 VITALS — BP 116/70 | HR 62 | Ht 66.0 in | Wt 191.0 lb

## 2016-06-04 DIAGNOSIS — I2510 Atherosclerotic heart disease of native coronary artery without angina pectoris: Secondary | ICD-10-CM

## 2016-06-04 DIAGNOSIS — I10 Essential (primary) hypertension: Secondary | ICD-10-CM | POA: Diagnosis not present

## 2016-06-04 DIAGNOSIS — E785 Hyperlipidemia, unspecified: Secondary | ICD-10-CM | POA: Diagnosis not present

## 2016-06-04 DIAGNOSIS — R0989 Other specified symptoms and signs involving the circulatory and respiratory systems: Secondary | ICD-10-CM

## 2016-06-04 DIAGNOSIS — Z8249 Family history of ischemic heart disease and other diseases of the circulatory system: Secondary | ICD-10-CM | POA: Insufficient documentation

## 2016-06-04 DIAGNOSIS — Z87891 Personal history of nicotine dependence: Secondary | ICD-10-CM | POA: Diagnosis not present

## 2016-06-04 MED ORDER — EZETIMIBE 10 MG PO TABS: 10.0000 mg | ORAL_TABLET | Freq: Every day | ORAL | 3 refills | Status: DC

## 2016-06-04 NOTE — Patient Instructions (Addendum)
Your physician has recommended you make the following change in your medication:  -- START zetia 10mg  once daily  Your physician recommends that you return for lab work in: [place] (fasting)  Your physician has requested that you have a carotid duplex in September 2018. This test is an ultrasound of the carotid arteries in your neck. It looks at blood flow through these arteries that supply the brain with blood. Allow one hour for this exam. There are no restrictions or special instructions.  Your physician has requested that you have an abdominal aorta duplex. During this test, an ultrasound is used to evaluate the aorta. Allow 30 minutes for this exam. Do not eat after midnight the day before and avoid carbonated beverages  Your physician recommends that you schedule a follow-up appointment with Dr. Debara Pickett after your testing.

## 2016-06-04 NOTE — Progress Notes (Signed)
OFFICE NOTE  Chief Complaint:  Establish new cardiologist (former Ron Parker patient)  Primary Care Physician: Noralee Space, MD  HPI:  Kurt Kidd is a 66 y.o. male with a past medial history significant for coronary artery disease status post Cypher DES in 2004 to the LAD. He underwent repeat cardiac catheterization in 2008 showing 10% in-stent restenosis and 40% proximal circumflex stenosis as well as 40% proximal RCA stenosis with normal LV function. He also has dyslipidemia and strong family history of premature coronary artery disease in both parents. He reports that his father actually had a carotid endarterectomy as well. Recently he was found to have a left carotid bruit which I also heard on exam today. In September 2017 he was found to have moderate left carotid artery stenosis and no significant stenosis in the right. He is a former smoker and has not had a screening abdominal ultrasound for aneurysm. He did have ultrasound of the abdomen in 2010 which showed no significant aneurysm at the time. Recent lab work was performed 2 weeks ago showing total cholesterol 166, HDL-C 50, LDL-C 99 and triglycerides 82. Non-HDL cholesterol of 115. Goal for aggressive therapy would be to lower the LDL-C less than 70 and non-HDL less than 100. I discussed that with him today. Overall he reports being asymptomatic. He denies any chest pain or worsening shortness of breath. 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  PMHx:  Past Medical History:  Diagnosis Date  . CAD (coronary artery disease)    DES.. 2004 /  catheterization 2008, 10% narrowing stent site, pain not cardiac  . Colitis, ischemic ([place])   . Degenerative joint disease   . Diabetes mellitus   . Diverticulosis   . Dyslipidemia   . Ejection fraction    65-70%, echo, March, 2012  . GERD (gastroesophageal reflux disease)   . Hypertension   . Mitral  regurgitation    Echo, mild, 2012  . Murmur   . Onychomycosis   . Overweight(278.02)   . Palpitations    Rapid heartbeat and shortness of breath after eating 2008 recurrent episode 2012    Past Surgical History:  Procedure Laterality Date  . ANGIOPLASTY / STENTING FEMORAL  09/09/2002    FAMHx:  Family History  Problem Relation Age of Onset  . Heart failure Mother   . Heart disease Mother   . Diabetes Father     father also had ASHD/ CABG  . Heart disease Father   . Colon cancer Brother     Brother also had AAA repair    SOCHx:   reports that he quit smoking about 26 years ago. His smoking use included Cigarettes. He has a 26.00 pack-year smoking history. He has never used smokeless tobacco. He reports that he drinks alcohol. His drug history is not on file.  ALLERGIES:  No Known Allergies  ROS: Pertinent items noted in HPI and remainder of comprehensive ROS otherwise negative.  HOME MEDS: Current Outpatient Prescriptions on File Prior to Visit  Medication Sig Dispense Refill  . ALPRAZolam (XANAX) 0.5 MG tablet TAKE 1/2 TO 1 TABLET BY MOUTH THREE TIMES DAILY AS DIRECTED 90 tablet 5  . aspirin 81 MG EC tablet Take 1 tablet (81 mg total) by mouth daily. 30 tablet 0  . atorvastatin (LIPITOR) 80 MG tablet Take 1 tablet (80 mg total) by mouth daily. 90 tablet 3  . Blood Glucose  Monitoring Suppl (ONE TOUCH ULTRA SYSTEM KIT) W/DEVICE KIT 1 kit by Does not apply route once. 1 each 0  . esomeprazole (NEXIUM) 40 MG capsule Take 1 capsule (40 mg total) by mouth daily. Take 30 minutes before a meal. 90 capsule 3  . glucose blood (ONE TOUCH ULTRA TEST) test strip Test blood sugar up to two times daily 50 each 11  . HYDROcodone-acetaminophen (NORCO/VICODIN) 5-325 MG per tablet Take as directed by Dr. Delilah Shan    . meloxicam (MOBIC) 15 MG tablet Take 1 tablet (15 mg total) by mouth daily as needed for pain. 14 tablet 0  . metFORMIN (GLUCOPHAGE) 500 MG tablet TAKE ONE TABLET BY MOUTH  TWICE DAILY WITH MEALS 180 tablet 1  . methocarbamol (ROBAXIN) 500 MG tablet Take 1 tablet (500 mg total) by mouth 3 (three) times daily as needed for muscle spasms. 90 tablet 0  . metoprolol (LOPRESSOR) 50 MG tablet Take 1 tablet (50 mg total) by mouth 2 (two) times daily. 180 tablet 3  . nitroGLYCERIN (NITROSTAT) 0.4 MG SL tablet Place 1 tablet (0.4 mg total) under the tongue every 5 (five) minutes as needed. 25 tablet 1  . ONE TOUCH ULTRA TEST test strip USE AS DIRECTED TO TEST BLOOD SUGAR 50 each 11  . tamsulosin (FLOMAX) 0.4 MG CAPS capsule TAKE ONE CAPSULE BY MOUTH ONCE DAILY AFTER SUPPER 30 capsule 5   No current facility-administered medications on file prior to visit.     LABS/IMAGING: No results found for this or any previous visit (from the past 48 hour(s)). No results found.  WEIGHTS: Wt Readings from Last 3 Encounters:  06/04/16 191 lb (86.6 kg)  05/21/16 187 lb 4 oz (84.9 kg)  12/30/15 185 lb 3.2 oz (84 kg)    VITALS: BP 116/70   Pulse 62   Ht 5' 6"  (1.676 m)   Wt 191 lb (86.6 kg)   SpO2 98%   BMI 30.83 kg/m   EXAM: General appearance: alert, no distress and mildly obese Neck: no JVD and Left carotid bruit Lungs: clear to auscultation bilaterally Heart: regular rate and rhythm Abdomen: soft, non-tender; bowel sounds normal; no masses,  no organomegaly and No pulsatile mass Extremities: extremities normal, atraumatic, no cyanosis or edema Pulses: 2+ and symmetric Skin: Skin color, texture, turgor normal. No rashes or lesions Neurologic: Grossly normal Psych: Pleasant  EKG: Normal sinus rhythm at 61, LVH with repolarization abnormality  ASSESSMENT: 1. CAD status post Cypher DES to the LAD in 2004, 10% in-stent restenosis in 2008 - moderate proximal LCX and RCA disease at the time 2. Dyslipidemia 3. Moderate left internal carotid artery stenosis 4. Former smoker 5. Family history of AAA 6. DM2  PLAN: 1.   Mr. Gidney has a history of coronary disease but  no active symptoms. His stent was placed in 2004 and did have some moderate two-vessel disease in 2008 but has been asymptomatic. He also has moderate left internal carotid artery stenosis by ultrasound in September 2017. He'll need a repeat September 2018. I like to get a screening abdominal ultrasound of the aorta due to his family history of AAA and the fact that is been over a year since this is been assessed. He also needs more aggressive lipid management. He is ready on high-dose Lipitor 80 mg which is good although goal LDL cholesterol less than 70 and non-HDL less than 100, therefore I will add ezetimibe 10 mg daily to his regimen. We'll repeat his lipid profile in 2 months.  Plan follow-up with me after his dopplers.  Thanks again for allowing me to see this very pleasant patient.   Total time spent with patient and extensive chart review: 40 minutes  Pixie Casino, MD, [facility] Attending Cardiologist [place] 06/04/2016, 8:52 AM

## 2016-06-05 ENCOUNTER — Other Ambulatory Visit: Payer: Medicare Other

## 2016-06-05 DIAGNOSIS — D649 Anemia, unspecified: Secondary | ICD-10-CM

## 2016-06-08 NOTE — Addendum Note (Signed)
Addended by: Parke Poisson E on: 06/08/2016 05:41 PM   Modules accepted: Orders

## 2016-06-08 NOTE — Telephone Encounter (Signed)
Stool culture was ordered - not needed Per SN, needs hemocult x3 cards and cancel the stool culture

## 2016-06-08 NOTE — Addendum Note (Signed)
Addended by: Parke Poisson E on: 06/08/2016 01:15 PM   Modules accepted: Orders

## 2016-06-09 LAB — STOOL CULTURE

## 2016-06-12 ENCOUNTER — Telehealth: Payer: Self-pay | Admitting: Pulmonary Disease

## 2016-06-12 NOTE — Telephone Encounter (Signed)
LMOM TCB x1 We do not have stool cards up here - will need to get them from the lab.  Also, he needs to make sure the lab DOES NOT draw his FLP ordered by cardiology > this is supposed to be done in June.    Working on the charge for the stool culture.

## 2016-06-13 ENCOUNTER — Other Ambulatory Visit (INDEPENDENT_AMBULATORY_CARE_PROVIDER_SITE_OTHER): Payer: Medicare Other

## 2016-06-13 ENCOUNTER — Other Ambulatory Visit: Payer: Self-pay | Admitting: Pulmonary Disease

## 2016-06-13 DIAGNOSIS — E538 Deficiency of other specified B group vitamins: Secondary | ICD-10-CM

## 2016-06-13 DIAGNOSIS — D649 Anemia, unspecified: Secondary | ICD-10-CM

## 2016-06-13 LAB — CBC WITH DIFFERENTIAL/PLATELET
BASOS PCT: 2.2 % (ref 0.0–3.0)
Basophils Absolute: 0.2 10*3/uL — ABNORMAL HIGH (ref 0.0–0.1)
EOS ABS: 0.4 10*3/uL (ref 0.0–0.7)
Eosinophils Relative: 5.5 % — ABNORMAL HIGH (ref 0.0–5.0)
HCT: 37.1 % — ABNORMAL LOW (ref 39.0–52.0)
HEMOGLOBIN: 12.5 g/dL — AB (ref 13.0–17.0)
LYMPHS ABS: 2 10*3/uL (ref 0.7–4.0)
Lymphocytes Relative: 27.9 % (ref 12.0–46.0)
MCHC: 33.8 g/dL (ref 30.0–36.0)
MCV: 79.5 fl (ref 78.0–100.0)
MONO ABS: 0.8 10*3/uL (ref 0.1–1.0)
Monocytes Relative: 11.7 % (ref 3.0–12.0)
NEUTROS PCT: 52.7 % (ref 43.0–77.0)
Neutro Abs: 3.7 10*3/uL (ref 1.4–7.7)
PLATELETS: 305 10*3/uL (ref 150.0–400.0)
RBC: 4.67 Mil/uL (ref 4.22–5.81)
RDW: 15.5 % (ref 11.5–15.5)
WBC: 7.1 10*3/uL (ref 4.0–10.5)

## 2016-06-13 LAB — VITAMIN B12: VITAMIN B 12: 226 pg/mL (ref 211–911)

## 2016-06-13 LAB — IBC PANEL
Iron: 37 ug/dL — ABNORMAL LOW (ref 42–165)
SATURATION RATIOS: 7.8 % — AB (ref 20.0–50.0)
Transferrin: 341 mg/dL (ref 212.0–360.0)

## 2016-06-13 LAB — FERRITIN: FERRITIN: 12.4 ng/mL — AB (ref 22.0–322.0)

## 2016-06-13 NOTE — Telephone Encounter (Signed)
Patient came into the office - advised that he needs to go to the lab to pick up stool cards - he voiced understanding - needs nothing further -pr

## 2016-06-14 ENCOUNTER — Telehealth: Payer: Self-pay | Admitting: Pulmonary Disease

## 2016-06-14 DIAGNOSIS — D509 Iron deficiency anemia, unspecified: Secondary | ICD-10-CM

## 2016-06-14 NOTE — Telephone Encounter (Signed)
Called and spoke with pt and he is aware of order that has been placed for GI consult.  Nothing further is needed.

## 2016-06-18 LAB — PROTEIN ELECTROPHORESIS, SERUM, WITH REFLEX
ALBUMIN ELP: 4 g/dL (ref 3.8–4.8)
ALPHA-1-GLOBULIN: 0.4 g/dL — AB (ref 0.2–0.3)
ALPHA-2-GLOBULIN: 0.8 g/dL (ref 0.5–0.9)
Beta 2: 0.3 g/dL (ref 0.2–0.5)
Beta Globulin: 0.5 g/dL (ref 0.4–0.6)
Gamma Globulin: 0.7 g/dL — ABNORMAL LOW (ref 0.8–1.7)
Total Protein, Serum Electrophoresis: 6.7 g/dL (ref 6.1–8.1)

## 2016-06-19 ENCOUNTER — Telehealth: Payer: Self-pay | Admitting: Pulmonary Disease

## 2016-06-19 ENCOUNTER — Other Ambulatory Visit (INDEPENDENT_AMBULATORY_CARE_PROVIDER_SITE_OTHER): Payer: Medicare Other

## 2016-06-19 DIAGNOSIS — D649 Anemia, unspecified: Secondary | ICD-10-CM

## 2016-06-19 LAB — HEMOCCULT SLIDES (X 3 CARDS)
FECAL OCCULT BLD: NEGATIVE
OCCULT 1: POSITIVE — AB
OCCULT 2: POSITIVE — AB
OCCULT 3: NEGATIVE
OCCULT 4: NEGATIVE
OCCULT 5: NEGATIVE

## 2016-06-19 NOTE — Telephone Encounter (Signed)
Spoke with Margarita Grizzle from [employer] today and she stated that the hemoccult cards that the pt returned to the lab---2 of the 6 tested positive.  Will forward message to SN to address on 06/20/2016

## 2016-06-20 ENCOUNTER — Encounter: Payer: Self-pay | Admitting: *Deleted

## 2016-06-20 NOTE — Telephone Encounter (Signed)
Per SN---  Pt is borderline anemic with low iron and 2 of 6 stool cards were positive for hidden blood.  He will need ROV with a GI doctor or follow back up with Dr. Bary Castilla in [place]  He will need:    FeSOl 325 mg  1 daily OTC vit b12 1000 once daily

## 2016-06-21 ENCOUNTER — Encounter: Payer: Self-pay | Admitting: General Surgery

## 2016-06-21 ENCOUNTER — Ambulatory Visit (INDEPENDENT_AMBULATORY_CARE_PROVIDER_SITE_OTHER): Payer: Medicare Other | Admitting: General Surgery

## 2016-06-21 VITALS — BP 138/76 | HR 90 | Resp 16 | Ht 66.0 in | Wt 188.0 lb

## 2016-06-21 DIAGNOSIS — D508 Other iron deficiency anemias: Secondary | ICD-10-CM | POA: Diagnosis not present

## 2016-06-21 DIAGNOSIS — I2510 Atherosclerotic heart disease of native coronary artery without angina pectoris: Secondary | ICD-10-CM | POA: Diagnosis not present

## 2016-06-21 MED ORDER — POLYETHYLENE GLYCOL 3350 17 GM/SCOOP PO POWD: ORAL | 0 refills | Status: DC

## 2016-06-21 NOTE — Telephone Encounter (Signed)
lmomtcb x 1 for the pt 

## 2016-06-21 NOTE — Patient Instructions (Signed)
Upper Endoscopy Upper endoscopy is a procedure to look inside the upper GI (gastrointestinal) tract. The upper GI tract is made up of:  The tube that carries food and liquid from your throat to your stomach (esophagus).  The stomach.  The first part of your small intestine (duodenum). This procedure is also called esophagogastroduodenoscopy (EGD) or gastroscopy. In this procedure, your health care provider passes a thin, flexible tube (endoscope) through your mouth and down your esophagus into your stomach. A small camera is attached to the end of the tube. Images from the camera appear on a monitor in the exam room. During this procedure, your health care provider may also remove a small piece of tissue to be sent to a lab and examined under a microscope (biopsy). Your health care provider may do an upper endoscopy to diagnose cancers of the upper GI tract. You may also have this procedure to find the cause of other conditions, such as:  Stomach pain.  Heartburn.  Pain or problems when swallowing.  Nausea and vomiting.  Stomach bleeding.  Stomach ulcers. Tell a health care provider about:  Any allergies you have.  All medicines you are taking, including vitamins, herbs, eye drops, creams, and over-the-counter medicines.  Any problems you or family members have had with anesthetic medicines.  Any blood disorders you have.  Any surgeries you have had.  Any medical conditions you have.  Whether you are pregnant or may be pregnant. What are the risks? Generally, this is a safe procedure. However, problems may occur, including:  Infection.  Bleeding.  Allergic reactions to medicines.  A tear or hole (perforation) in the esophagus, stomach, or duodenum. What happens before the procedure?  Follow instructions from your health care provider about eating or drinking restrictions.  Ask your health care provider about changing or stopping your regular medicines. This is  especially important if you are taking diabetes medicines or blood thinners.  Plan to have someone take you home after the procedure.  If you go home right after the procedure, plan to have someone with you for 24 hours. What happens during the procedure?  An IV tube will be inserted into one of your veins.  Your throat may be sprayed with medicine that numbs the area (local anesthetic).  You may be given a medicine to help you relax (sedative).  You will lie on your left side.  Your health care provider will pass the endoscope through your mouth and down your esophagus.  Your provider will use the scope to check the inside of your esophagus, stomach, and duodenum. Biopsies may be taken. The procedure may vary among health care providers and hospitals. What happens after the procedure?  Do not drive for 24 hours if you received a sedative.  Your blood pressure, heart rate, breathing rate, and blood oxygen level will be monitored often until the medicines you were given have worn off.  When your throat is no longer numb, you may be given some fluids to drink.  It is your responsibility to get the results of your procedure. Ask your health care provider or the department performing the procedure when your results will be ready. This information is not intended to replace advice given to you by your health care provider. Make sure you discuss any questions you have with your health care provider. Document Released: 02/17/2000 Document Revised: 08/02/2015 Document Reviewed: 12/02/2014 Elsevier Interactive Patient Education  2017 [place].  Colonoscopy, Adult A colonoscopy is an exam to  look at the entire large intestine. During the exam, a lubricated, bendable tube is inserted into the anus and then passed into the rectum, colon, and other parts of the large intestine. A colonoscopy is often done as a part of normal colorectal screening or in response to certain symptoms, such as  anemia, persistent diarrhea, abdominal pain, and blood in the stool. The exam can help screen for and diagnose medical problems, including:  Tumors.  Polyps.  Inflammation.  Areas of bleeding. Tell a health care provider about:  Any allergies you have.  All medicines you are taking, including vitamins, herbs, eye drops, creams, and over-the-counter medicines.  Any problems you or family members have had with anesthetic medicines.  Any blood disorders you have.  Any surgeries you have had.  Any medical conditions you have.  Any problems you have had passing stool. What are the risks? Generally, this is a safe procedure. However, problems may occur, including:  Bleeding.  A tear in the intestine.  A reaction to medicines given during the exam.  Infection (rare). What happens before the procedure? Eating and drinking restrictions  Follow instructions from your health care provider about eating and drinking, which may include:  A few days before the procedure - follow a low-fiber diet. Avoid nuts, seeds, dried fruit, raw fruits, and vegetables.  1-3 days before the procedure - follow a clear liquid diet. Drink only clear liquids, such as clear broth or bouillon, black coffee or tea, clear juice, clear soft drinks or sports drinks, gelatin dessert, and popsicles. Avoid any liquids that contain red or purple dye.  On the day of the procedure - do not eat or drink anything during the 2 hours before the procedure, or within the time period that your health care provider recommends. Bowel prep  If you were prescribed an oral bowel prep to clean out your colon:  Take it as told by your health care provider. Starting the day before your procedure, you will need to drink a large amount of medicated liquid. The liquid will cause you to have multiple loose stools until your stool is almost clear or light green.  If your skin or anus gets irritated from diarrhea, you may use these to  relieve the irritation:  Medicated wipes, such as adult wet wipes with aloe and vitamin E.  A skin soothing-product like petroleum jelly.  If you vomit while drinking the bowel prep, take a break for up to 60 minutes and then begin the bowel prep again. If vomiting continues and you cannot take the bowel prep without vomiting, call your health care provider. General instructions   Ask your health care provider about changing or stopping your regular medicines. This is especially important if you are taking diabetes medicines or blood thinners.  Plan to have someone take you home from the hospital or clinic. What happens during the procedure?  An IV tube may be inserted into one of your veins.  You will be given medicine to help you relax (sedative).  To reduce your risk of infection:  Your health care team will wash or sanitize their hands.  Your anal area will be washed with soap.  You will be asked to lie on your side with your knees bent.  Your health care provider will lubricate a long, thin, flexible tube. The tube will have a camera and a light on the end.  The tube will be inserted into your anus.  The tube will be gently eased  through your rectum and colon.  Air will be delivered into your colon to keep it open. You may feel some pressure or cramping.  The camera will be used to take images during the procedure.  A small tissue sample may be removed from your body to be examined under a microscope (biopsy). If any potential problems are found, the tissue will be sent to a lab for testing.  If small polyps are found, your health care provider may remove them and have them checked for cancer cells.  The tube that was inserted into your anus will be slowly removed. The procedure may vary among health care providers and hospitals. What happens after the procedure?  Your blood pressure, heart rate, breathing rate, and blood oxygen level will be monitored until the  medicines you were given have worn off.  Do not drive for 24 hours after the exam.  You may have a small amount of blood in your stool.  You may pass gas and have mild abdominal cramping or bloating due to the air that was used to inflate your colon during the exam.  It is up to you to get the results of your procedure. Ask your health care provider, or the department performing the procedure, when your results will be ready. This information is not intended to replace advice given to you by your health care provider. Make sure you discuss any questions you have with your health care provider. Document Released: 02/17/2000 Document Revised: 12/21/2015 Document Reviewed: 05/03/2015 Elsevier Interactive Patient Education  2017 [employer].

## 2016-06-21 NOTE — Progress Notes (Signed)
Patient ID: SY SAINTJEAN, male   DOB: 03-Nov-1950, 66 y.o.   MRN: [id number]  Chief Complaint  Patient presents with  . Anemia    HPI Kurt Kidd is a 66 y.o. male.  Here today for evaluation of anemia referred by Dr Lenna Gilford. He states that his blood counts were low so he tested his stool. His stool card tested positive for blood this past weekend. He states his bowels move daily, no bleeding. He denies any gastrointestinal issues. He does admit to being "tired". He states 7 years ago he had a massive GI bleed and never found source. Last colonoscopy was 2014. His wife and he states that about 40 minutes after he eats he has a "breathing spell" . Nervousness in both arms and shaky but no pain. He thinks it's when his sugar is high or a lot of carbs. Denies shortness of breath with activity. He is here with his wife, Kurt Kidd.  HPI  Past Medical History:  Diagnosis Date  . CAD (coronary artery disease)    DES.. 2004 /  catheterization 2008, 10% narrowing stent site, pain not cardiac  . Colitis, ischemic ([place])   . Colon polyp   . Degenerative joint disease   . Diabetes mellitus 2012  . Diverticulosis   . Dyslipidemia   . Ejection fraction    65-70%, echo, March, 2012  . GERD (gastroesophageal reflux disease)   . Hyperlipidemia   . Hypertension   . Mitral regurgitation    Echo, mild, 2012  . Murmur   . Onychomycosis   . Overweight(278.02)   . Palpitations    Rapid heartbeat and shortness of breath after eating 2008 recurrent episode 2012    Past Surgical History:  Procedure Laterality Date  . ANGIOPLASTY / STENTING FEMORAL  09/09/2002  . CARDIAC CATHETERIZATION    . COLONOSCOPY  2014   Dr Bary Castilla  . COLONOSCOPY  11/26/2007   Dr Patterson/POLYPOID COLONIC MUCOSA WITH HYPEREMIA AND  . UPPER GI ENDOSCOPY  11/26/2007   Dr Sharlett Iles    Family History  Problem Relation Age of Onset  . Heart failure Mother   . Heart disease Mother   . Diabetes Father     father  also had ASHD/ CABG  . Heart disease Father   . Colon cancer Brother 39  . AAA (abdominal aortic aneurysm) Brother 85    Social History Social History  Substance Use Topics  . Smoking status: Former Smoker    Packs/day: 1.00    Years: 26.00    Types: Cigarettes    Quit date: 03/05/1990  . Smokeless tobacco: Never Used  . Alcohol use 0.0 oz/week     Comment: 2 drinks per day    No Known Allergies  Current Outpatient Prescriptions  Medication Sig Dispense Refill  . ALPRAZolam (XANAX) 0.5 MG tablet TAKE 1/2 TO 1 TABLET BY MOUTH THREE TIMES DAILY AS DIRECTED (Patient taking differently: 3 (three) times daily as needed. TAKE 1/2 TO 1 TABLET BY MOUTH THREE TIMES DAILY AS DIRECTED) 90 tablet 5  . aspirin 81 MG EC tablet Take 1 tablet (81 mg total) by mouth daily. 30 tablet 0  . atorvastatin (LIPITOR) 80 MG tablet Take 1 tablet (80 mg total) by mouth daily. 90 tablet 3  . Blood Glucose Monitoring Suppl (ONE TOUCH ULTRA SYSTEM KIT) W/DEVICE KIT 1 kit by Does not apply route once. 1 each 0  . esomeprazole (NEXIUM) 40 MG capsule Take 1 capsule (40 mg total) by mouth  daily. Take 30 minutes before a meal. 90 capsule 3  . ezetimibe (ZETIA) 10 MG tablet Take 1 tablet (10 mg total) by mouth daily. 90 tablet 3  . glucose blood (ONE TOUCH ULTRA TEST) test strip Test blood sugar up to two times daily 50 each 11  . metFORMIN (GLUCOPHAGE) 500 MG tablet TAKE ONE TABLET BY MOUTH TWICE DAILY WITH MEALS 180 tablet 1  . methocarbamol (ROBAXIN) 500 MG tablet Take 1 tablet (500 mg total) by mouth 3 (three) times daily as needed for muscle spasms. 90 tablet 0  . metoprolol (LOPRESSOR) 50 MG tablet Take 1 tablet (50 mg total) by mouth 2 (two) times daily. 180 tablet 3  . nitroGLYCERIN (NITROSTAT) 0.4 MG SL tablet Place 1 tablet (0.4 mg total) under the tongue every 5 (five) minutes as needed. 25 tablet 1  . ONE TOUCH ULTRA TEST test strip USE AS DIRECTED TO TEST BLOOD SUGAR 50 each 11  . tamsulosin (FLOMAX) 0.4  MG CAPS capsule TAKE ONE CAPSULE BY MOUTH ONCE DAILY AFTER  SUPPER 30 capsule 5  . polyethylene glycol powder (GLYCOLAX/MIRALAX) powder 255 grams one bottle for colonoscopy prep 255 g 0   No current facility-administered medications for this visit.     Review of Systems Review of Systems  Constitutional: Positive for fatigue.  Respiratory: Negative.   Cardiovascular: Negative.   Gastrointestinal: Negative.     Blood pressure 138/76, pulse 90, resp. rate 16, height 5' 6" (1.676 m), weight 188 lb (85.3 kg).  Physical Exam Physical Exam  Constitutional: He is oriented to person, place, and time. He appears well-developed and well-nourished.  HENT:  Mouth/Throat: Oropharynx is clear and moist.  Eyes: Conjunctivae are normal. No scleral icterus.  Neck: Neck supple. Carotid bruit is present.  Left carotid bruit present  Cardiovascular: Normal rate and regular rhythm.   Murmur heard.  Systolic murmur is present with a grade of 2/6  Pulmonary/Chest: Effort normal and breath sounds normal.  Abdominal: Soft.  Lymphadenopathy:    He has no cervical adenopathy.  Neurological: He is alert and oriented to person, place, and time.  Skin: Skin is warm and dry.  Psychiatric: His behavior is normal.    Data Reviewed Upper endoscopy dated 11/02/2002 for pulmonary symptoms including shortness of breath after meals was reviewed. The esophagus was described as normal. Distal esophagus showed an irregular Z line from which biopsies were taken. Gastric antrum showed erythematous mucosa area.  GE junction biopsy showed mild inflammation without intestinal metaplasia or evidence of malignancy.  11/06/2004: Biopsy showed minimal colitis with mucosal hyperemia and edema.  Colonoscopy dated 04/15/2012 completed as a screening exam was normal. A five-year follow-up was planned based on his family history.  Stool Hemoccult cards dated 06/19/2016 showed 2 of 6 positive.  Laboratory studies dated  06/13/2016 showed a low ferritin level of 12.4. Low normal B12 at 226.  Serum iron has fallen from 90 in 2012-37 today. Transfer level remains normal at 341. Iron saturation is low at 7.8%.  Hemoglobin one year ago was 14.0. One month ago 12.7. On 06/14/1998 1812.5. MCV has fallen from 83.9-79.5. Platelet count remains normal at 305,000. Normal differential and white blood cell count.  Assessment    Gradually progressive anemia, microcytic indices, heme positive stool.    Plan    Indications for upper lower endoscopy were reviewed.     Upper endoscopy and Colonoscopy with possible biopsy/polypectomy prn: Information regarding the procedure, including its potential risks and complications (including but not  limited to perforation of the bowel, which may require emergency surgery to repair, and bleeding) was verbally given to the patient. Educational information regarding lower intestinal endoscopy was given to the patient. Written instructions for how to complete the bowel prep using Miralax were provided. The importance of drinking ample fluids to avoid dehydration as a result of the prep emphasized.  He is going to try taking a nitroglycerin the next time he has a breathing shaky spell after he eats.  HPI, Physical Exam, Assessment and Plan have been scribed under the direction and in the presence of Robert Bellow, MD.  Karie Fetch, RN  I have completed the exam and reviewed the above documentation for accuracy and completeness.  I agree with the above.  Haematologist has been used and any errors in dictation or transcription are unintentional.  Hervey Ard, M.D., F.A.C.S.  Robert Bellow 06/22/2016, 8:04 PM  Patient has been scheduled for a colonoscopy on 07-13-16 at [facility]. This patient has been asked to hold metformin day of colonoscopy prep and procedure. Miralax prescription has been sent in to the patient's pharmacy today. Colonoscopy instructions have been  reviewed with the patient. This patient is aware to call the office if they have further questions.   Dominga Ferry, CMA

## 2016-06-21 NOTE — Telephone Encounter (Signed)
Pt called back and he is aware of labs that have been sent to Dr. Fleet Contras.  He has an appt today with him.  He is aware of starting the iron and b12 daily.  Nothing further is needed.

## 2016-06-22 DIAGNOSIS — D649 Anemia, unspecified: Secondary | ICD-10-CM | POA: Insufficient documentation

## 2016-06-23 ENCOUNTER — Encounter: Payer: Self-pay | Admitting: Pulmonary Disease

## 2016-06-23 DIAGNOSIS — E611 Iron deficiency: Secondary | ICD-10-CM | POA: Insufficient documentation

## 2016-06-23 NOTE — Progress Notes (Addendum)
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 to Ortho + Rx w/ rest, hear, brace, Robaxin Tid prn...  ONYCHOMYCOSIS (ICD-110.1) - Mar09 notes nail fungus on left hand and both feet... LAMISIL '250mg'$ /d started... hands resolved now and toes are improved.   Past Surgical History:  Procedure Laterality Date  . ANGIOPLASTY / STENTING FEMORAL  09/09/2002  . CARDIAC CATHETERIZATION    . COLONOSCOPY  2014   Dr ByrBary Castilla COLONOSCOPY  11/26/2007   Dr Patterson/POLYPOID COLONIC MUCOSA WITH HYPEREMIA AND  . UPPER GI ENDOSCOPY  11/26/2007   Dr PatSharlett Iles Outpatient Encounter Prescriptions as of 05/21/2016  Medication Sig  . ALPRAZolam (XANAX) 0.5 MG tablet TAKE 1/2 TO 1 TABLET BY MOUTH THREE TIMES DAILY AS DIRECTED (Patient taking differently: 3 (three) times daily as needed. TAKE 1/2 TO 1 TABLET BY MOUTH THREE TIMES DAILY AS DIRECTED)  . aspirin 81 MG EC tablet Take 1 tablet (81 mg total) by mouth daily.  . aMarland Kitchenorvastatin (LIPITOR) 80 MG tablet Take 1 tablet (80 mg total) by mouth daily.  . Blood Glucose Monitoring Suppl (ONE TOUCH ULTRA SYSTEM KIT) W/DEVICE KIT 1 kit by Does not apply route once.  . eMarland Kitchenomeprazole (NEXIUM) 40  MG capsule Take 1 capsule (40 mg total) by mouth daily. Take 30 minutes before a meal.  . glucose blood (ONE TOUCH ULTRA TEST) test strip Test blood sugar up to two times daily  . metFORMIN (GLUCOPHAGE) 500 MG tablet TAKE ONE TABLET BY MOUTH TWICE DAILY WITH MEALS  . methocarbamol (ROBAXIN) 500 MG tablet Take 1 tablet (500 mg total) by mouth  3 (three) times daily as needed for muscle spasms.  . metoprolol (LOPRESSOR) 50 MG tablet Take 1 tablet (50 mg total) by mouth 2 (two) times daily.  . nitroGLYCERIN (NITROSTAT) 0.4 MG SL tablet Place 1 tablet (0.4 mg total) under the tongue every 5 (five) minutes as needed.  . ONE TOUCH ULTRA TEST test strip USE AS DIRECTED TO TEST BLOOD SUGAR  . [DISCONTINUED] HYDROcodone-acetaminophen (NORCO/VICODIN) 5-325 MG per tablet Take as directed by Dr. Penni Bombard  . [DISCONTINUED] meloxicam (MOBIC) 15 MG tablet Take 1 tablet (15 mg total) by mouth daily as needed for pain.  . [DISCONTINUED] tamsulosin (FLOMAX) 0.4 MG CAPS capsule TAKE ONE CAPSULE BY MOUTH ONCE DAILY AFTER SUPPER   No facility-administered encounter medications on file as of 05/21/2016.     No Known Allergies    Family History  Problem Relation Age of Onset  . Heart failure Mother   . Heart disease Mother & Father   . Diabetes Father     father also had ASHD/ CABG  . Colon cancer Brother had large villous adenoma w/ focal invasion and surg from DrByrnett 63  . AAA (abdominal aortic aneurysm) Brother (passed away) 71    Current Medications, Allergies, Past Medical History, Past Surgical History, Family History, and Social History were reviewed in [employer] record.   Review of Systems    The patient denies fever, chills, sweats, anorexia, fatigue, weakness, malaise, weight loss, sleep disorder, blurring, diplopia, eye irritation, eye discharge, vision loss, eye pain, photophobia, earache, ear discharge, tinnitus, decreased hearing, nasal congestion, nosebleeds, sore  throat, hoarseness, chest pain, palpitations, syncope, dyspnea on exertion, orthopnea, PND, peripheral edema, cough, dyspnea at rest, excessive sputum, hemoptysis, wheezing, pleurisy, nausea, vomiting, diarrhea, constipation, change in bowel habits, abdominal pain, melena, hematochezia, jaundice, gas/bloating, indigestion/heartburn, dysphagia, odynophagia, dysuria, hematuria, urinary frequency, urinary hesitancy, nocturia, incontinence, back pain, joint pain, joint swelling, muscle cramps, muscle weakness, stiffness, arthritis, sciatica, restless legs, leg pain at night, leg pain with exertion, rash, itching, dryness, suspicious lesions, paralysis, paresthesias, seizures, tremors, vertigo, transient blindness, frequent falls, frequent headaches, difficulty walking, depression, anxiety, memory loss, confusion, cold intolerance, heat intolerance, polydipsia, polyphagia, polyuria, unusual weight change, abnormal bruising, bleeding, enlarged lymph nodes, urticaria, allergic rash, hay fever, and recurrent infections.     Objective:   Physical Exam     WD, Overweight, 66 y/o WM in NAD...  GENERAL:  Alert & oriented; pleasant & cooperative... HEENT:  [place]/AT, EOM-wnl, PERRLA, EACs-clear, TMs-wnl, NOSE-clear, THROAT-clear & wnl. NECK:  Supple w/ fairROM; no JVD; normal carotid impulses w/ faint left carotid bruit; no thyromegaly or nodules palpated; no lymphadenopathy. CHEST:  Clear to P & A; without wheezes/ rales/ or rhonchi. HEART:  Regular Rhythm;  gr 1/6 SEM without rubs or gallops detected... ABDOMEN:  Soft & min tender in RUQ; normal bowel sounds; no organomegaly or masses palpated... EXT: without deformities, mild arthritic changes; no varicose veins/ venous insuffic/ or edema. NEURO:  CN's intact;  no focal neuro deficits... DERM:  onychomycosis improved as noted...  RADIOLOGY DATA:  Reviewed in the EPIC EMR & discussed w/ the patient...  LABORATORY DATA:  Reviewed in the EPIC EMR & discussed w/  the patient...   Assessment:      HBP & LVH>  BP seems well controlled on BBlocker monotherapy; we reviewed no salt, & wt reduction...   CAD> on ASA81 & Metoprolol50Bid; he had unusual symptom complex that was evaluated & discussed w/ Cards; it seems to have resolved on  it's own & he knows to avoid excess alcohol, caffeine, etc... 05/2016>  Referred to [facility] to re-establish for f/u care...  Hyperlipidemia> on Lip80; FLP shows poor numbers but he is non-compliant according to my phone call to [facility] on [address]; asked to take the Lip80 every day to help prevent an MI or stroke...  DM> on Metform500Bid now; BS 121, A1c 6.4 & we reviewed diet, exercise & wt reduction strategies...  Overweight> wt= 187# & he knows the importance of wt reduction...  GI> GERD, Divertics, Hx ischemic colitis> on Omep20 for reflux esoph; continue same.  Hx Rising PSA>  The PSA velocity has diminished w/ PSA=3.10 in 05/2014 & 3.27 in 11/2014, and f/u PSA 3/17= 2.45 & now 2.50  DJD, LBP>  He is followed by DrBrooks at [facility] on Vicodin & Robaxin...     Plan:      Patient's Medications  New Prescriptions   EZETIMIBE (ZETIA) 10 MG TABLET    Take 1 tablet (10 mg total) by mouth daily.   POLYETHYLENE GLYCOL POWDER (GLYCOLAX/MIRALAX) POWDER    255 grams one bottle for colonoscopy prep  Previous Medications   ALPRAZOLAM (XANAX) 0.5 MG TABLET    TAKE 1/2 TO 1 TABLET BY MOUTH THREE TIMES DAILY AS DIRECTED   ASPIRIN 81 MG EC TABLET    Take 1 tablet (81 mg total) by mouth daily.   ATORVASTATIN (LIPITOR) 80 MG TABLET    Take 1 tablet (80 mg total) by mouth daily.   BLOOD GLUCOSE MONITORING SUPPL (ONE TOUCH ULTRA SYSTEM KIT) W/DEVICE KIT    1 kit by Does not apply route once.   ESOMEPRAZOLE (NEXIUM) 40 MG CAPSULE    Take 1 capsule (40 mg total) by mouth daily. Take 30 minutes before a meal.   GLUCOSE BLOOD (ONE TOUCH ULTRA TEST) TEST STRIP    Test blood sugar up to two times daily   METFORMIN (GLUCOPHAGE) 500 MG  TABLET    TAKE ONE TABLET BY MOUTH TWICE DAILY WITH MEALS   METHOCARBAMOL (ROBAXIN) 500 MG TABLET    Take 1 tablet (500 mg total) by mouth 3 (three) times daily as needed for muscle spasms.   METOPROLOL (LOPRESSOR) 50 MG TABLET    Take 1 tablet (50 mg total) by mouth 2 (two) times daily.   NITROGLYCERIN (NITROSTAT) 0.4 MG SL TABLET    Place 1 tablet (0.4 mg total) under the tongue every 5 (five) minutes as needed.   ONE TOUCH ULTRA TEST TEST STRIP    USE AS DIRECTED TO TEST BLOOD SUGAR  Modified Medications   Modified Medication Previous Medication   TAMSULOSIN (FLOMAX) 0.4 MG CAPS CAPSULE tamsulosin (FLOMAX) 0.4 MG CAPS capsule      TAKE ONE CAPSULE BY MOUTH ONCE DAILY AFTER  SUPPER    TAKE ONE CAPSULE BY MOUTH ONCE DAILY AFTER SUPPER  Discontinued Medications   HYDROCODONE-ACETAMINOPHEN (NORCO/VICODIN) 5-325 MG PER TABLET    Take as directed by Dr. Delilah Shan   MELOXICAM (MOBIC) 15 MG TABLET    Take 1 tablet (15 mg total) by mouth daily as needed for pain.

## 2016-06-26 ENCOUNTER — Ambulatory Visit (HOSPITAL_COMMUNITY)
Admission: RE | Admit: 2016-06-26 | Discharge: 2016-06-26 | Disposition: A | Discharge status: Home / Self Care | Payer: Medicare Other | Source: Ambulatory Visit | Attending: Cardiovascular Disease | Admitting: Cardiovascular Disease

## 2016-06-26 DIAGNOSIS — E119 Type 2 diabetes mellitus without complications: Secondary | ICD-10-CM | POA: Diagnosis not present

## 2016-06-26 DIAGNOSIS — I708 Atherosclerosis of other arteries: Secondary | ICD-10-CM | POA: Diagnosis not present

## 2016-06-26 DIAGNOSIS — Z87891 Personal history of nicotine dependence: Secondary | ICD-10-CM | POA: Diagnosis not present

## 2016-06-26 DIAGNOSIS — I700 Atherosclerosis of aorta: Secondary | ICD-10-CM | POA: Diagnosis not present

## 2016-06-26 DIAGNOSIS — Z8249 Family history of ischemic heart disease and other diseases of the circulatory system: Secondary | ICD-10-CM | POA: Insufficient documentation

## 2016-06-26 DIAGNOSIS — I10 Essential (primary) hypertension: Secondary | ICD-10-CM | POA: Diagnosis not present

## 2016-06-26 DIAGNOSIS — E785 Hyperlipidemia, unspecified: Secondary | ICD-10-CM | POA: Diagnosis not present

## 2016-06-26 DIAGNOSIS — I2510 Atherosclerotic heart disease of native coronary artery without angina pectoris: Secondary | ICD-10-CM | POA: Insufficient documentation

## 2016-07-09 ENCOUNTER — Other Ambulatory Visit: Payer: Self-pay | Admitting: Pulmonary Disease

## 2016-07-13 ENCOUNTER — Ambulatory Visit
Admission: RE | Admit: 2016-07-13 | Discharge: 2016-07-13 | Disposition: A | Discharge status: Home / Self Care | Payer: Medicare Other | Source: Ambulatory Visit | Attending: General Surgery | Admitting: General Surgery

## 2016-07-13 ENCOUNTER — Ambulatory Visit: Payer: Medicare Other | Admitting: Anesthesiology

## 2016-07-13 ENCOUNTER — Encounter: Payer: Self-pay | Admitting: *Deleted

## 2016-07-13 ENCOUNTER — Encounter
Admission: RE | Disposition: A | Discharge status: Home / Self Care | Payer: Self-pay | Source: Ambulatory Visit | Attending: General Surgery

## 2016-07-13 DIAGNOSIS — K635 Polyp of colon: Secondary | ICD-10-CM | POA: Diagnosis not present

## 2016-07-13 DIAGNOSIS — I10 Essential (primary) hypertension: Secondary | ICD-10-CM | POA: Diagnosis not present

## 2016-07-13 DIAGNOSIS — D649 Anemia, unspecified: Secondary | ICD-10-CM | POA: Diagnosis present

## 2016-07-13 DIAGNOSIS — Z87891 Personal history of nicotine dependence: Secondary | ICD-10-CM | POA: Diagnosis not present

## 2016-07-13 DIAGNOSIS — D131 Benign neoplasm of stomach: Secondary | ICD-10-CM | POA: Diagnosis not present

## 2016-07-13 DIAGNOSIS — K317 Polyp of stomach and duodenum: Secondary | ICD-10-CM | POA: Insufficient documentation

## 2016-07-13 DIAGNOSIS — D509 Iron deficiency anemia, unspecified: Secondary | ICD-10-CM | POA: Diagnosis not present

## 2016-07-13 DIAGNOSIS — D120 Benign neoplasm of cecum: Secondary | ICD-10-CM | POA: Insufficient documentation

## 2016-07-13 DIAGNOSIS — I2510 Atherosclerotic heart disease of native coronary artery without angina pectoris: Secondary | ICD-10-CM | POA: Diagnosis not present

## 2016-07-13 DIAGNOSIS — D508 Other iron deficiency anemias: Secondary | ICD-10-CM

## 2016-07-13 DIAGNOSIS — I739 Peripheral vascular disease, unspecified: Secondary | ICD-10-CM | POA: Insufficient documentation

## 2016-07-13 DIAGNOSIS — K219 Gastro-esophageal reflux disease without esophagitis: Secondary | ICD-10-CM | POA: Insufficient documentation

## 2016-07-13 HISTORY — PX: COLONOSCOPY WITH PROPOFOL: SHX5780

## 2016-07-13 HISTORY — PX: ESOPHAGOGASTRODUODENOSCOPY (EGD) WITH PROPOFOL: SHX5813

## 2016-07-13 LAB — GLUCOSE, CAPILLARY: GLUCOSE-CAPILLARY: 128 mg/dL — AB (ref 65–99)

## 2016-07-13 SURGERY — ESOPHAGOGASTRODUODENOSCOPY (EGD) WITH PROPOFOL
Anesthesia: General

## 2016-07-13 MED ORDER — PROPOFOL 500 MG/50ML IV EMUL
INTRAVENOUS | Status: DC | PRN
  Administered 2016-07-13: 140 ug/kg/min via INTRAVENOUS

## 2016-07-13 MED ORDER — PROPOFOL 10 MG/ML IV BOLUS
INTRAVENOUS | Status: DC | PRN
  Administered 2016-07-13: 90 mg via INTRAVENOUS

## 2016-07-13 MED ORDER — PROPOFOL 500 MG/50ML IV EMUL
INTRAVENOUS | Status: AC
  Filled 2016-07-13: qty 50

## 2016-07-13 MED ORDER — SODIUM CHLORIDE 0.9 % IV SOLN
INTRAVENOUS | Status: DC
  Administered 2016-07-13: 08:00:00 via INTRAVENOUS

## 2016-07-13 NOTE — Op Note (Signed)
[facility] Gastroenterology Patient Name: Kurt Kidd Procedure Date: 07/13/2016 8:33 AM MRN: [id number] Account #: [id number] Date of Birth: [birth year] Admit Type: Outpatient Age: 66 Room: [facility] ENDO ROOM 3 Gender: Male Note Status: Finalized Procedure:            Colonoscopy Indications:          Iron deficiency anemia Providers:            Robert Bellow, MD Referring MD:         Deborra Medina. Lenna Gilford (Referring MD) Medicines:            Monitored Anesthesia Care Complications:        No immediate complications. Procedure:            Pre-Anesthesia Assessment:                       - Prior to the procedure, a History and Physical was                        performed, and patient medications, allergies and                        sensitivities were reviewed. The patient's tolerance of                        previous anesthesia was reviewed.                       - The risks and benefits of the procedure and the                        sedation options and risks were discussed with the                        patient. All questions were answered and informed                        consent was obtained.                       After obtaining informed consent, the colonoscope was                        passed under direct vision. Throughout the procedure,                        the patient's blood pressure, pulse, and oxygen                        saturations were monitored continuously. The                        Colonoscope was introduced through the anus and                        advanced to the the cecum, identified by appendiceal                        orifice and ileocecal valve. The colonoscopy was  performed without difficulty. The patient tolerated the                        procedure well. The quality of the bowel preparation                        was excellent. Findings:      Two sessile polyps were found in the cecum. The polyps  were 3 to 6 mm in       size. These were biopsied with a cold forceps for histology.      The retroflexed view of the distal rectum and anal verge was normal and       showed no anal or rectal abnormalities. Impression:           - Two 3 to 6 mm polyps in the cecum. Biopsied.                       - The distal rectum and anal verge are normal on                        retroflexion view. Recommendation:       - Telephone endoscopist for pathology results in 1 week. Procedure Code(s):    --- Professional ---                       [phone], Colonoscopy, flexible; with biopsy, single or                        multiple Diagnosis Code(s):    --- Professional ---                       D12.0, Benign neoplasm of cecum                       D50.9, Iron deficiency anemia, unspecified CPT copyright 2016 American Medical Association. All rights reserved. The codes documented in this report are preliminary and upon coder review may  be revised to meet current compliance requirements. Robert Bellow, MD 07/13/2016 9:18:31 AM This report has been signed electronically. Number of Addenda: 0 Note Initiated On: 07/13/2016 8:33 AM Scope Withdrawal Time: 0 hours 11 minutes 15 seconds  Total Procedure Duration: 0 hours 14 minutes 53 seconds       [facility]

## 2016-07-13 NOTE — Anesthesia Post-op Follow-up Note (Cosign Needed)
Anesthesia QCDR form completed.        

## 2016-07-13 NOTE — Op Note (Signed)
[facility] Gastroenterology Patient Name: Kurt Kidd Procedure Date: 07/13/2016 8:33 AM MRN: [id number] Account #: [id number] Date of Birth: [birth year] Admit Type: Outpatient Age: 66 Room: [facility] ENDO ROOM 3 Gender: Male Note Status: Finalized Procedure:            Upper GI endoscopy Indications:          Iron deficiency anemia Providers:            Robert Bellow, MD Referring MD:         Deborra Medina. Lenna Gilford (Referring MD) Medicines:            Monitored Anesthesia Care Complications:        No immediate complications. Procedure:            Pre-Anesthesia Assessment:                       - Prior to the procedure, a History and Physical was                        performed, and patient medications, allergies and                        sensitivities were reviewed. The patient's tolerance of                        previous anesthesia was reviewed.                       - The risks and benefits of the procedure and the                        sedation options and risks were discussed with the                        patient. All questions were answered and informed                        consent was obtained.                       After obtaining informed consent, the endoscope was                        passed under direct vision. Throughout the procedure,                        the patient's blood pressure, pulse, and oxygen                        saturations were monitored continuously. The Endoscope                        was introduced through the mouth, and advanced to the                        third part of duodenum. The upper GI endoscopy was                        accomplished without difficulty. The patient tolerated  the procedure well. Findings:      The esophagus was normal.      A single 10 mm pedunculated polyp with no bleeding and no stigmata of       recent bleeding was found in the gastric body. The polyp was removed   with a hot snare. Resection and retrieval were complete.      Multiple 15 mm sessile polyps with no bleeding were found in the       duodenal bulb. Biopsies were taken with a cold forceps for histology. Impression:           - Normal esophagus.                       - A single gastric polyp. Resected and retrieved.                       - Multiple duodenal polyps. Biopsied. Recommendation:       - Perform a colonoscopy today. Procedure Code(s):    --- Professional ---                       [phone], Esophagogastroduodenoscopy, flexible, transoral;                        with removal of tumor(s), polyp(s), or other lesion(s)                        by snare technique Diagnosis Code(s):    --- Professional ---                       D50.9, Iron deficiency anemia, unspecified                       K31.7, Polyp of stomach and duodenum CPT copyright 2016 American Medical Association. All rights reserved. The codes documented in this report are preliminary and upon coder review may  be revised to meet current compliance requirements. Robert Bellow, MD 07/13/2016 9:00:15 AM This report has been signed electronically. Number of Addenda: 0 Note Initiated On: 07/13/2016 8:33 AM      [facility]

## 2016-07-13 NOTE — Anesthesia Preprocedure Evaluation (Signed)
Anesthesia Evaluation  Patient identified by MRN, date of birth, ID band Patient awake    Reviewed: Allergy & Precautions, H&P , NPO status , Patient's Chart, lab work & pertinent test results, reviewed documented beta blocker date and time   Airway Mallampati: II   Neck ROM: full    Dental  (+) Poor Dentition   Pulmonary neg pulmonary ROS, shortness of breath and with exertion, former smoker,    Pulmonary exam normal        Cardiovascular hypertension, + CAD and + Peripheral Vascular Disease  negative cardio ROS Normal cardiovascular exam+ Valvular Problems/Murmurs MR  Rhythm:regular Rate:Normal     Neuro/Psych PSYCHIATRIC DISORDERS negative neurological ROS  negative psych ROS   GI/Hepatic negative GI ROS, Neg liver ROS, GERD  Medicated,  Endo/Other  negative endocrine ROSdiabetes  Renal/GU negative Renal ROS  negative genitourinary   Musculoskeletal   Abdominal   Peds  Hematology negative hematology ROS (+) anemia ,   Anesthesia Other Findings Past Medical History: No date: CAD (coronary artery disease)     Comment: DES.. 2004 /  catheterization 2008, 10%               narrowing stent site, pain not cardiac No date: Colitis, ischemic (HCC) No date: Colon polyp No date: Degenerative joint disease 2012: Diabetes mellitus No date: Diverticulosis No date: Dyslipidemia No date: Ejection fraction     Comment: 65-70%, echo, March, 2012 No date: GERD (gastroesophageal reflux disease) No date: Hyperlipidemia No date: Hypertension No date: Mitral regurgitation     Comment: Echo, mild, 2012 No date: Murmur No date: Onychomycosis No date: Overweight(278.02) No date: Palpitations     Comment: Rapid heartbeat and shortness of breath after               eating 2008 recurrent episode 2012 Past Surgical History: 09/09/2002: ANGIOPLASTY / STENTING FEMORAL No date: CARDIAC CATHETERIZATION 2014: COLONOSCOPY  Comment: Dr Bary Castilla 11/26/2007: COLONOSCOPY     Comment: Dr Elvera Maria COLONIC MUCOSA WITH               HYPEREMIA AND 11/26/2007: UPPER GI ENDOSCOPY     Comment: Dr Sharlett Iles BMI    Body Mass Index:  30.34 kg/m     Reproductive/Obstetrics negative OB ROS                             Anesthesia Physical Anesthesia Plan  ASA: III  Anesthesia Plan: General   Post-op Pain Management:    Induction:   Airway Management Planned:   Additional Equipment:   Intra-op Plan:   Post-operative Plan:   Informed Consent: I have reviewed the patients History and Physical, chart, labs and discussed the procedure including the risks, benefits and alternatives for the proposed anesthesia with the patient or authorized representative who has indicated his/her understanding and acceptance.   Dental Advisory Given  Plan Discussed with: CRNA  Anesthesia Plan Comments:         Anesthesia Quick Evaluation

## 2016-07-13 NOTE — H&P (Signed)
No interval change in clinical history or exam. 

## 2016-07-13 NOTE — Transfer of Care (Signed)
Immediate Anesthesia Transfer of Care Note  Patient: Kurt Kidd  Procedure(s) Performed: Procedure(s): ESOPHAGOGASTRODUODENOSCOPY (EGD) WITH PROPOFOL (N/A) COLONOSCOPY WITH PROPOFOL (N/A)  Patient Location: PACU and Endoscopy Unit  Anesthesia Type:General  Level of Consciousness: drowsy and responds to stimulation  Airway & Oxygen Therapy: Patient Spontanous Breathing and Patient connected to nasal cannula oxygen  Post-op Assessment: Report given to RN and Post -op Vital signs reviewed and stable  Post vital signs: Reviewed and stable  Last Vitals:  Vitals:   07/13/16 0757 07/13/16 0919  BP: 119/62 (!) 110/91  Pulse: (!) 59 (!) 57  Resp: 16 19  Temp: 36.5 C (!) 35.9 C    Last Pain:  Vitals:   07/13/16 0919  TempSrc: Tympanic         Complications: No apparent anesthesia complications

## 2016-07-16 ENCOUNTER — Encounter: Payer: Self-pay | Admitting: General Surgery

## 2016-07-16 ENCOUNTER — Other Ambulatory Visit: Payer: Self-pay

## 2016-07-16 ENCOUNTER — Ambulatory Visit (INDEPENDENT_AMBULATORY_CARE_PROVIDER_SITE_OTHER): Payer: Medicare Other | Admitting: Internal Medicine

## 2016-07-16 VITALS — BP 132/73 | HR 76 | Ht 66.0 in | Wt 189.6 lb

## 2016-07-16 DIAGNOSIS — E785 Hyperlipidemia, unspecified: Secondary | ICD-10-CM | POA: Diagnosis not present

## 2016-07-16 DIAGNOSIS — R0989 Other specified symptoms and signs involving the circulatory and respiratory systems: Secondary | ICD-10-CM | POA: Diagnosis not present

## 2016-07-16 DIAGNOSIS — I10 Essential (primary) hypertension: Secondary | ICD-10-CM | POA: Diagnosis not present

## 2016-07-16 DIAGNOSIS — D508 Other iron deficiency anemias: Secondary | ICD-10-CM

## 2016-07-16 DIAGNOSIS — I2510 Atherosclerotic heart disease of native coronary artery without angina pectoris: Secondary | ICD-10-CM | POA: Diagnosis not present

## 2016-07-16 LAB — SURGICAL PATHOLOGY

## 2016-07-16 NOTE — Progress Notes (Signed)
OFFICE NOTE  Chief Complaint:  Follow-up studies  Primary Care Physician: Noralee Space, MD  HPI:  Kurt Kidd is a 66 y.o. male with a past medial history significant for coronary artery disease status post Cypher DES in 2004 to the LAD. He underwent repeat cardiac catheterization in 2008 showing 10% in-stent restenosis and 40% proximal circumflex stenosis as well as 40% proximal RCA stenosis with normal LV function. He also has dyslipidemia and strong family history of premature coronary artery disease in both parents. He reports that his father actually had a carotid endarterectomy as well. Recently he was found to have a left carotid bruit which I also heard on exam today. In September 2017 he was found to have moderate left carotid artery stenosis and no significant stenosis in the right. He is a former smoker and has not had a screening abdominal ultrasound for aneurysm. He did have ultrasound of the abdomen in 2010 which showed no significant aneurysm at the time. Recent lab work was performed 2 weeks ago showing total cholesterol 166, HDL-C 50, LDL-C 99 and triglycerides 82. Non-HDL cholesterol of 115. Goal for aggressive therapy would be to lower the LDL-C less than 70 and non-HDL less than 100. I discussed that with him today. Overall he reports being asymptomatic. He denies any chest pain or worsening shortness of breath. 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  07/16/2016  Kurt Kidd returns today for follow-up of his studies. He underwent abdominal aortic aneurysm screening Doppler which was negative for aneurysm (based on family history of AAA). He does have a history of moderate left internal carotid artery stenosis and has pending carotid Doppler scheduled for 07/20/2016. He's had no other symptoms of coronary disease. Cholesterol was not at goal and I started him on ezetimibe. He is due for repeat  lipid profile in one month.  PMHx:  Past Medical History:  Diagnosis Date  . CAD (coronary artery disease)    DES.. 2004 /  catheterization 2008, 10% narrowing stent site, pain not cardiac  . Colitis, ischemic ([place])   . Colon polyp   . Degenerative joint disease   . Diabetes mellitus 2012  . Diverticulosis   . Dyslipidemia   . Ejection fraction    65-70%, echo, March, 2012  . GERD (gastroesophageal reflux disease)   . Hyperlipidemia   . Hypertension   . Mitral regurgitation    Echo, mild, 2012  . Murmur   . Onychomycosis   . Overweight(278.02)   . Palpitations    Rapid heartbeat and shortness of breath after eating 2008 recurrent episode 2012    Past Surgical History:  Procedure Laterality Date  . ANGIOPLASTY / STENTING FEMORAL  09/09/2002  . CARDIAC CATHETERIZATION    . COLONOSCOPY  2014   Dr Bary Castilla  . COLONOSCOPY  11/26/2007   Dr Patterson/POLYPOID COLONIC MUCOSA WITH HYPEREMIA AND  . COLONOSCOPY WITH PROPOFOL N/A 07/13/2016   Procedure: COLONOSCOPY WITH PROPOFOL;  Surgeon: Robert Bellow, MD;  Location: [facility] ENDOSCOPY;  Service: Endoscopy;  Laterality: N/A;  . ESOPHAGOGASTRODUODENOSCOPY (EGD) WITH PROPOFOL N/A 07/13/2016   Procedure: ESOPHAGOGASTRODUODENOSCOPY (EGD) WITH PROPOFOL;  Surgeon: Robert Bellow, MD;  Location: [facility] ENDOSCOPY;  Service: Endoscopy;  Laterality: N/A;  . UPPER GI ENDOSCOPY  11/26/2007   Dr Sharlett Iles    FAMHx:  Family History  Problem Relation Age of Onset  . Heart failure Mother   .  Heart disease Mother   . Diabetes Father        father also had ASHD/ CABG  . Heart disease Father   . Colon cancer Brother 91  . AAA (abdominal aortic aneurysm) Brother 35    SOCHx:   reports that he quit smoking about 26 years ago. His smoking use included Cigarettes. He has a 26.00 pack-year smoking history. He has never used smokeless tobacco. He reports that he drinks alcohol. He reports that he does not use drugs.  ALLERGIES:  No Known  Allergies  ROS: Pertinent items noted in HPI and remainder of comprehensive ROS otherwise negative.  HOME MEDS: Current Outpatient Prescriptions on File Prior to Visit  Medication Sig Dispense Refill  . ALPRAZolam (XANAX) 0.5 MG tablet TAKE 1/2 TO 1 TABLET BY MOUTH THREE TIMES DAILY AS DIRECTED (Patient taking differently: 3 (three) times daily as needed. TAKE 1/2 TO 1 TABLET BY MOUTH THREE TIMES DAILY AS DIRECTED) 90 tablet 5  . aspirin 81 MG EC tablet Take 1 tablet (81 mg total) by mouth daily. 30 tablet 0  . atorvastatin (LIPITOR) 80 MG tablet Take 1 tablet (80 mg total) by mouth daily. 90 tablet 3  . Blood Glucose Monitoring Suppl (ONE TOUCH ULTRA SYSTEM KIT) W/DEVICE KIT 1 kit by Does not apply route once. 1 each 0  . esomeprazole (NEXIUM) 40 MG capsule Take 1 capsule (40 mg total) by mouth daily. Take 30 minutes before a meal. 90 capsule 3  . ezetimibe (ZETIA) 10 MG tablet Take 1 tablet (10 mg total) by mouth daily. 90 tablet 3  . glucose blood (ONE TOUCH ULTRA TEST) test strip Test blood sugar up to two times daily 50 each 11  . metFORMIN (GLUCOPHAGE) 500 MG tablet TAKE ONE TABLET BY MOUTH TWICE DAILY WITH MEALS 180 tablet 1  . methocarbamol (ROBAXIN) 500 MG tablet Take 1 tablet (500 mg total) by mouth 3 (three) times daily as needed for muscle spasms. 90 tablet 0  . metoprolol (LOPRESSOR) 50 MG tablet Take 1 tablet (50 mg total) by mouth 2 (two) times daily. 180 tablet 3  . nitroGLYCERIN (NITROSTAT) 0.4 MG SL tablet Place 1 tablet (0.4 mg total) under the tongue every 5 (five) minutes as needed. 25 tablet 1  . ONE TOUCH ULTRA TEST test strip USE AS DIRECTED TO TEST BLOOD SUGAR 50 each 11  . tamsulosin (FLOMAX) 0.4 MG CAPS capsule TAKE ONE CAPSULE BY MOUTH ONCE DAILY AFTER  SUPPER 30 capsule 5   No current facility-administered medications on file prior to visit.     LABS/IMAGING: No results found for this or any previous visit (from the past 48 hour(s)). No results  found.  WEIGHTS: Wt Readings from Last 3 Encounters:  07/16/16 189 lb 9.6 oz (86 kg)  07/13/16 188 lb (85.3 kg)  06/21/16 188 lb (85.3 kg)    VITALS: BP 132/73   Pulse 76   Ht _0  (1.676 m)   Wt 189 lb 9.6 oz (86 kg)   BMI 30.60 kg/m   EXAM: Deferred  EKG: Deferred  ASSESSMENT: 1. CAD status post Cypher DES to the LAD in 2004, 10% in-stent restenosis in 2008 - moderate proximal LCX and RCA disease at the time 2. Dyslipidemia 3. Moderate left internal carotid artery stenosis 4. Former smoker 5. Family history of AAA - no AAA by ultrasound 07/2016 6. DM2  PLAN: 1.   Mr. Fesperman had a negative Doppler for AAA. His carotid Dopplers are pending. Were going  to intensify his lipid-lowering therapy with the addition of ezetimibe. He'll need repeat lipid profile in 2 months. Follow-up with me annually or sooner as necessary. I'll contact him with the results of his carotid Dopplers and updated lipid profile.  Pixie Casino, MD, [facility] Attending Cardiologist [place] 07/16/2016, 10:57 AM

## 2016-07-16 NOTE — Anesthesia Postprocedure Evaluation (Signed)
Anesthesia Post Note  Patient: Kurt Kidd  Procedure(s) Performed: Procedure(s) (LRB): ESOPHAGOGASTRODUODENOSCOPY (EGD) WITH PROPOFOL (N/A) COLONOSCOPY WITH PROPOFOL (N/A)  Patient location during evaluation: PACU Anesthesia Type: General Level of consciousness: awake and alert Pain management: pain level controlled Vital Signs Assessment: post-procedure vital signs reviewed and stable Respiratory status: spontaneous breathing, nonlabored ventilation, respiratory function stable and patient connected to nasal cannula oxygen Cardiovascular status: blood pressure returned to baseline and stable Postop Assessment: no signs of nausea or vomiting Anesthetic complications: no     Last Vitals:  Vitals:   07/13/16 0939 07/13/16 0949  BP: (!) 105/57 126/60  Pulse: (!) 56 (!) 59  Resp: 15 16  Temp:      Last Pain:  Vitals:   07/13/16 0919  TempSrc: Tympanic                 Molli Barrows

## 2016-07-16 NOTE — Patient Instructions (Signed)
Your physician wants you to follow-up in: ONE YEAR with Dr. Hilty. You will receive a reminder letter in the mail two months in advance. If you don't receive a letter, please call our office to schedule the follow-up appointment.  

## 2016-07-17 ENCOUNTER — Telehealth: Payer: Self-pay

## 2016-07-17 ENCOUNTER — Telehealth: Payer: Self-pay | Admitting: Pulmonary Disease

## 2016-07-17 MED ORDER — METFORMIN HCL 500 MG PO TABS: 500.0000 mg | ORAL_TABLET | Freq: Three times a day (TID) | ORAL | 3 refills | Status: DC

## 2016-07-17 NOTE — Telephone Encounter (Signed)
Per SN---  Ok to change his rx to metformin 500 mg  To 1 po TID and this has been sent to his pharmacy.  Nothing further is needed.

## 2016-07-17 NOTE — Telephone Encounter (Signed)
Notified patient as instructed, patient pleased. Discussed small bowel follow thru and patient is amendable to this. Patient is scheduled for a small bowel follow thru at [facility] on 07/23/16 at 8:00 am. He will arrive by 7:45 am and have nothing to eat or drink after midnight. Patient to have follow up appointment.

## 2016-07-17 NOTE — Telephone Encounter (Signed)
Spoke with pt, who states he went he pick his Rx up and was told that insurance would not cover until 08/08/16. Pt states he occ take 1 tablet tid, when he feels that his sugar is high. Pt states he does this approximately 3-4 times monthly. I have spoken with [facility], who confirmed that pt has requested Rx too soon. Pt was last was prescribed a 21mo supply in March. Pt has requested that [facility] write a new Rx for 1 tab tid.  SN please advise. Thanks.

## 2016-07-17 NOTE — Telephone Encounter (Signed)
-----   Message from Robert Bellow, MD sent at 07/16/2016  3:49 PM EDT ----- Please notify the patient all the biopsies were benign. He does need a repeat colonoscopy in 5 years.  I would like him to have a small bowel follow-through with an appointment to follow to complete the evaluation of his anemia.  ----- Message ----- From: Interface, Lab In Three Zero One Sent: 07/16/2016  10:47 AM To: Robert Bellow, MD

## 2016-07-23 ENCOUNTER — Ambulatory Visit
Admission: RE | Admit: 2016-07-23 | Discharge: 2016-07-23 | Disposition: A | Discharge status: Home / Self Care | Payer: Medicare Other | Source: Ambulatory Visit | Attending: General Surgery | Admitting: General Surgery

## 2016-07-23 ENCOUNTER — Ambulatory Visit (INDEPENDENT_AMBULATORY_CARE_PROVIDER_SITE_OTHER): Payer: Medicare Other | Admitting: General Surgery

## 2016-07-23 ENCOUNTER — Encounter: Payer: Self-pay | Admitting: General Surgery

## 2016-07-23 VITALS — BP 118/78 | HR 77 | Resp 14 | Ht 66.0 in | Wt 192.0 lb

## 2016-07-23 DIAGNOSIS — I2510 Atherosclerotic heart disease of native coronary artery without angina pectoris: Secondary | ICD-10-CM

## 2016-07-23 DIAGNOSIS — D508 Other iron deficiency anemias: Secondary | ICD-10-CM

## 2016-07-23 DIAGNOSIS — D509 Iron deficiency anemia, unspecified: Secondary | ICD-10-CM | POA: Diagnosis not present

## 2016-07-23 NOTE — Patient Instructions (Addendum)
Stool hemoccult cards-6 samples to be done. Drop off at our office once complete, or you may mail them.  Try nitro with next spell after eating

## 2016-07-23 NOTE — Progress Notes (Signed)
Patient ID: Kurt Kidd, male   DOB: Feb 25, 1951, 66 y.o.   MRN: [id number]  Chief Complaint  Patient presents with  . Other    anemia    HPI Kurt Kidd is a 66 y.o. male here following up for anemia. He had his small bowel follow through done this morning.  HPI  Past Medical History:  Diagnosis Date  . CAD (coronary artery disease)    DES.. 2004 /  catheterization 2008, 10% narrowing stent site, pain not cardiac  . Colitis, ischemic ([place])   . Colon polyp   . Degenerative joint disease   . Diabetes mellitus 2012  . Diverticulosis   . Dyslipidemia   . Ejection fraction    65-70%, echo, March, 2012  . GERD (gastroesophageal reflux disease)   . Hyperlipidemia   . Hypertension   . Mitral regurgitation    Echo, mild, 2012  . Murmur   . Onychomycosis   . Overweight(278.02)   . Palpitations    Rapid heartbeat and shortness of breath after eating 2008 recurrent episode 2012    Past Surgical History:  Procedure Laterality Date  . ANGIOPLASTY / STENTING FEMORAL  09/09/2002  . CARDIAC CATHETERIZATION    . COLONOSCOPY  2014   Dr Bary Castilla  . COLONOSCOPY  11/26/2007   Dr Patterson/POLYPOID COLONIC MUCOSA WITH HYPEREMIA AND  . COLONOSCOPY WITH PROPOFOL N/A 07/13/2016   Procedure: COLONOSCOPY WITH PROPOFOL;  Surgeon: Robert Bellow, MD;  Location: [facility] ENDOSCOPY;  Service: Endoscopy;  Laterality: N/A;  . ESOPHAGOGASTRODUODENOSCOPY (EGD) WITH PROPOFOL N/A 07/13/2016   Procedure: ESOPHAGOGASTRODUODENOSCOPY (EGD) WITH PROPOFOL;  Surgeon: Robert Bellow, MD;  Location: [facility] ENDOSCOPY;  Service: Endoscopy;  Laterality: N/A;  . UPPER GI ENDOSCOPY  11/26/2007   Dr Sharlett Iles    Family History  Problem Relation Age of Onset  . Heart failure Mother   . Heart disease Mother   . Diabetes Father        father also had ASHD/ CABG  . Heart disease Father   . Colon cancer Brother 64  . AAA (abdominal aortic aneurysm) Brother 29    Social History Social History  Substance  Use Topics  . Smoking status: Former Smoker    Packs/day: 1.00    Years: 26.00    Types: Cigarettes    Quit date: 03/05/1990  . Smokeless tobacco: Never Used  . Alcohol use 0.0 oz/week     Comment: 2 drinks per day    No Known Allergies  Current Outpatient Prescriptions  Medication Sig Dispense Refill  . ALPRAZolam (XANAX) 0.5 MG tablet TAKE 1/2 TO 1 TABLET BY MOUTH THREE TIMES DAILY AS DIRECTED (Patient taking differently: 3 (three) times daily as needed. TAKE 1/2 TO 1 TABLET BY MOUTH THREE TIMES DAILY AS DIRECTED) 90 tablet 5  . aspirin 81 MG EC tablet Take 1 tablet (81 mg total) by mouth daily. 30 tablet 0  . atorvastatin (LIPITOR) 80 MG tablet Take 1 tablet (80 mg total) by mouth daily. 90 tablet 3  . Blood Glucose Monitoring Suppl (ONE TOUCH ULTRA SYSTEM KIT) W/DEVICE KIT 1 kit by Does not apply route once. 1 each 0  . esomeprazole (NEXIUM) 40 MG capsule Take 1 capsule (40 mg total) by mouth daily. Take 30 minutes before a meal. 90 capsule 3  . ezetimibe (ZETIA) 10 MG tablet Take 1 tablet (10 mg total) by mouth daily. 90 tablet 3  . metFORMIN (GLUCOPHAGE) 500 MG tablet Take 1 tablet (500 mg total) by mouth  3 (three) times daily. 270 tablet 3  . methocarbamol (ROBAXIN) 500 MG tablet Take 1 tablet (500 mg total) by mouth 3 (three) times daily as needed for muscle spasms. 90 tablet 0  . metoprolol (LOPRESSOR) 50 MG tablet Take 1 tablet (50 mg total) by mouth 2 (two) times daily. 180 tablet 3  . nitroGLYCERIN (NITROSTAT) 0.4 MG SL tablet Place 1 tablet (0.4 mg total) under the tongue every 5 (five) minutes as needed. 25 tablet 1  . tamsulosin (FLOMAX) 0.4 MG CAPS capsule TAKE ONE CAPSULE BY MOUTH ONCE DAILY AFTER  SUPPER 30 capsule 5   No current facility-administered medications for this visit.     Review of Systems Review of Systems  Constitutional: Negative.   Respiratory: Negative.   Cardiovascular: Negative.     Blood pressure 118/78, pulse 77, resp. rate 14, height 5' 6"  (1.676 m), weight 192 lb (87.1 kg).  Physical Exam Physical Exam  Data Reviewed Small bowel follow-through completed earlier today showed no source of bleeding, abnormality or pathology.  Assessment    Microcytic anemia without clear source.    Plan    The patient will be asked to complete stool Hemoccult slides 6.   If negative, will defer capsule endoscopy. If more than one sample is positive, we'll recommend capsule endoscopy in spite of the fact this requires another formal bowel prep.    HPI, Physical Exam, Assessment and Plan have been scribed under the direction and in the presence of Robert Bellow, MD  Concepcion Living, LPN  I have completed the exam and reviewed the above documentation for accuracy and completeness.  I agree with the above.  Haematologist has been used and any errors in dictation or transcription are unintentional.  Hervey Ard, M.D., F.A.C.S.  Robert Bellow 07/24/2016, 5:42 PM

## 2016-07-26 ENCOUNTER — Ambulatory Visit (INDEPENDENT_AMBULATORY_CARE_PROVIDER_SITE_OTHER): Payer: Medicare Other | Admitting: *Deleted

## 2016-07-26 DIAGNOSIS — D508 Other iron deficiency anemias: Secondary | ICD-10-CM

## 2016-07-26 LAB — POC HEMOCCULT BLD/STL (HOME/3-CARD/SCREEN)
Card #3 Fecal Occult Blood, POC: NEGATIVE
FECAL OCCULT BLD: NEGATIVE
FECAL OCCULT BLD: NEGATIVE

## 2016-07-26 NOTE — Progress Notes (Signed)
Stool cards are negative x3.

## 2016-08-01 ENCOUNTER — Ambulatory Visit (INDEPENDENT_AMBULATORY_CARE_PROVIDER_SITE_OTHER): Payer: Medicare Other | Admitting: *Deleted

## 2016-08-01 DIAGNOSIS — D508 Other iron deficiency anemias: Secondary | ICD-10-CM

## 2016-08-01 LAB — POC HEMOCCULT BLD/STL (HOME/3-CARD/SCREEN)
FECAL OCCULT BLD: NEGATIVE
FECAL OCCULT BLD: NEGATIVE
Fecal Occult Blood, POC: NEGATIVE

## 2016-08-01 NOTE — Patient Instructions (Signed)
The patient is aware to call back for any questions or concerns.  

## 2016-08-01 NOTE — Progress Notes (Signed)
Patient ID: Kurt Kidd, male   DOB: 1950/07/30, 66 y.o.   MRN: [id number]   Stool cards all 3 negative. No follow up at this time.

## 2016-08-19 ENCOUNTER — Other Ambulatory Visit: Payer: Self-pay | Admitting: Pulmonary Disease

## 2016-09-25 ENCOUNTER — Telehealth: Payer: Self-pay | Admitting: *Deleted

## 2016-09-25 NOTE — Telephone Encounter (Signed)
-----   Message from Robert Bellow, MD sent at 09/25/2016  3:23 PM EDT ----- Please let him know in case I forgot to that everything looks good so far and no additional testing is required.  He would be encouraged to have a follow-up colonoscopy in 5 years. ----- Message ----- From: Carson Myrtle, RN Sent: 07/26/2016   2:05 PM To: Robert Bellow, MD

## 2016-09-26 NOTE — Telephone Encounter (Signed)
Notified patient as instructed, patient pleased. Discussed follow-up appointments, patient agrees. Placed in recalls.  

## 2016-11-20 ENCOUNTER — Ambulatory Visit (HOSPITAL_COMMUNITY)
Admission: RE | Admit: 2016-11-20 | Discharge: 2016-11-20 | Disposition: A | Discharge status: Home / Self Care | Payer: Medicare Other | Source: Ambulatory Visit | Attending: Cardiovascular Disease | Admitting: Cardiovascular Disease

## 2016-11-20 DIAGNOSIS — E785 Hyperlipidemia, unspecified: Secondary | ICD-10-CM | POA: Diagnosis not present

## 2016-11-20 DIAGNOSIS — I6523 Occlusion and stenosis of bilateral carotid arteries: Secondary | ICD-10-CM | POA: Insufficient documentation

## 2016-11-20 DIAGNOSIS — E119 Type 2 diabetes mellitus without complications: Secondary | ICD-10-CM | POA: Insufficient documentation

## 2016-11-20 DIAGNOSIS — I2510 Atherosclerotic heart disease of native coronary artery without angina pectoris: Secondary | ICD-10-CM | POA: Diagnosis not present

## 2016-11-20 DIAGNOSIS — Z87891 Personal history of nicotine dependence: Secondary | ICD-10-CM | POA: Insufficient documentation

## 2016-11-20 DIAGNOSIS — R0989 Other specified symptoms and signs involving the circulatory and respiratory systems: Secondary | ICD-10-CM

## 2016-11-20 DIAGNOSIS — I10 Essential (primary) hypertension: Secondary | ICD-10-CM | POA: Insufficient documentation

## 2016-11-26 ENCOUNTER — Ambulatory Visit (INDEPENDENT_AMBULATORY_CARE_PROVIDER_SITE_OTHER): Payer: Medicare Other | Admitting: Adult Health

## 2016-11-26 ENCOUNTER — Other Ambulatory Visit (INDEPENDENT_AMBULATORY_CARE_PROVIDER_SITE_OTHER): Payer: Medicare Other

## 2016-11-26 ENCOUNTER — Encounter: Payer: Self-pay | Admitting: Adult Health

## 2016-11-26 VITALS — BP 132/70 | HR 65 | Temp 97.8°F | Ht 66.0 in | Wt 191.2 lb

## 2016-11-26 DIAGNOSIS — K5730 Diverticulosis of large intestine without perforation or abscess without bleeding: Secondary | ICD-10-CM | POA: Diagnosis not present

## 2016-11-26 DIAGNOSIS — E785 Hyperlipidemia, unspecified: Secondary | ICD-10-CM

## 2016-11-26 DIAGNOSIS — I10 Essential (primary) hypertension: Secondary | ICD-10-CM

## 2016-11-26 DIAGNOSIS — N401 Enlarged prostate with lower urinary tract symptoms: Secondary | ICD-10-CM

## 2016-11-26 DIAGNOSIS — K219 Gastro-esophageal reflux disease without esophagitis: Secondary | ICD-10-CM

## 2016-11-26 DIAGNOSIS — Z23 Encounter for immunization: Secondary | ICD-10-CM | POA: Diagnosis not present

## 2016-11-26 DIAGNOSIS — E119 Type 2 diabetes mellitus without complications: Secondary | ICD-10-CM

## 2016-11-26 DIAGNOSIS — I2510 Atherosclerotic heart disease of native coronary artery without angina pectoris: Secondary | ICD-10-CM

## 2016-11-26 DIAGNOSIS — Z0000 Encounter for general adult medical examination without abnormal findings: Secondary | ICD-10-CM | POA: Diagnosis not present

## 2016-11-26 DIAGNOSIS — N138 Other obstructive and reflux uropathy: Secondary | ICD-10-CM | POA: Diagnosis not present

## 2016-11-26 DIAGNOSIS — D508 Other iron deficiency anemias: Secondary | ICD-10-CM

## 2016-11-26 LAB — CBC WITH DIFFERENTIAL/PLATELET
BASOS PCT: 2 % (ref 0.0–3.0)
Basophils Absolute: 0.1 10*3/uL (ref 0.0–0.1)
EOS PCT: 4.4 % (ref 0.0–5.0)
Eosinophils Absolute: 0.3 10*3/uL (ref 0.0–0.7)
HCT: 35.8 % — ABNORMAL LOW (ref 39.0–52.0)
HEMOGLOBIN: 11.7 g/dL — AB (ref 13.0–17.0)
LYMPHS PCT: 24.3 % (ref 12.0–46.0)
Lymphs Abs: 1.6 10*3/uL (ref 0.7–4.0)
MCHC: 32.8 g/dL (ref 30.0–36.0)
MCV: 78.2 fl (ref 78.0–100.0)
MONOS PCT: 11.5 % (ref 3.0–12.0)
Monocytes Absolute: 0.8 10*3/uL (ref 0.1–1.0)
NEUTROS ABS: 3.8 10*3/uL (ref 1.4–7.7)
NEUTROS PCT: 57.8 % (ref 43.0–77.0)
Platelets: 286 10*3/uL (ref 150.0–400.0)
RBC: 4.58 Mil/uL (ref 4.22–5.81)
RDW: 15.4 % (ref 11.5–15.5)
WBC: 6.7 10*3/uL (ref 4.0–10.5)

## 2016-11-26 LAB — IBC PANEL
IRON: 31 ug/dL — AB (ref 42–165)
Saturation Ratios: 6.2 % — ABNORMAL LOW (ref 20.0–50.0)
Transferrin: 356 mg/dL (ref 212.0–360.0)

## 2016-11-26 LAB — LIPID PANEL
CHOLESTEROL: 141 mg/dL (ref 0–200)
HDL: 48.6 mg/dL (ref >39.00)
LDL Cholesterol: 72 mg/dL (ref 0–99)
NonHDL: 92.74
TRIGLYCERIDES: 106 mg/dL (ref 0.0–149.0)
Total CHOL/HDL Ratio: 3
VLDL: 21.2 mg/dL (ref 0.0–40.0)

## 2016-11-26 LAB — BASIC METABOLIC PANEL
BUN: 12 mg/dL (ref 6–23)
CALCIUM: 9.8 mg/dL (ref 8.4–10.5)
CO2: 30 meq/L (ref 19–32)
Chloride: 102 mEq/L (ref 96–112)
Creatinine, Ser: 0.7 mg/dL (ref 0.40–1.50)
GFR: 119.19 mL/min (ref >60.00)
GLUCOSE: 127 mg/dL — AB (ref 70–99)
POTASSIUM: 5.2 meq/L — AB (ref 3.5–5.1)
SODIUM: 141 meq/L (ref 135–145)

## 2016-11-26 LAB — HEPATIC FUNCTION PANEL
ALT: 24 U/L (ref 0–53)
AST: 21 U/L (ref 0–37)
Albumin: 4.4 g/dL (ref 3.5–5.2)
Alkaline Phosphatase: 65 U/L (ref 39–117)
BILIRUBIN DIRECT: 0.1 mg/dL (ref 0.0–0.3)
BILIRUBIN TOTAL: 0.5 mg/dL (ref 0.2–1.2)
Total Protein: 6.6 g/dL (ref 6.0–8.3)

## 2016-11-26 LAB — HEMOGLOBIN A1C: Hgb A1c MFr Bld: 6.7 % — ABNORMAL HIGH (ref 4.6–6.5)

## 2016-11-26 LAB — PSA: PSA: 2.47 ng/mL (ref 0.10–4.00)

## 2016-11-26 LAB — TSH: TSH: 2.27 u[IU]/mL (ref 0.35–4.50)

## 2016-11-26 LAB — FERRITIN: Ferritin: 10.3 ng/mL — ABNORMAL LOW (ref 22.0–322.0)

## 2016-11-26 MED ORDER — ESOMEPRAZOLE MAGNESIUM 40 MG PO CPDR: 40.0000 mg | DELAYED_RELEASE_CAPSULE | Freq: Every day | ORAL | 0 refills | Status: DC

## 2016-11-26 NOTE — Assessment & Plan Note (Signed)
At goal , cont on current regimen .  No changes  Labs pending .

## 2016-11-26 NOTE — Progress Notes (Signed)
_0  ID: Kurt Kidd, male    DOB: 1950/03/21, 66 y.o.   MRN: [id number]  Chief Complaint  Patient presents with  . Follow-up    Referring provider: Noralee Space, MD  HPI: 66 year old male followed for hypertension, hyperlipidemia, diabetes, reflux, osteoarthritis And CAD , PVD .   TEST  NuclearStressTest 7/04 showed ?anteroseptal scar vs thinning, no ischemia, EF=61%... ~  initial cath 7/04 w/ 80% LAD lesion... stent placed by DrStuckey... ~  last cath 8/08 showed non-obstructive CAD & prev stent site w/ <10% narrowing, EF=65%... FLP 3/17 on Lip80 w/ better compliance showed TChol 168, TG 92, HDL 50, LDL 100  Labs 3/17 on Metform500Bid showed BS= 121, A1c= 6.4 07/2016 EGD showed norm esoph, one 36m pedunculated polyp in stomach (fundic gland polyp- neg HPylori etc), & several duodenal polyps (ectopic gastric tissue- neg HPylori etc);  Colon showed 2 sessile polyps in the cecum 3-675msize (both tubular adenomas & neg for malig etc).... Left carotid stenosis>  CDoppler 11/28/15 showed bilat heterogeneous plaque w/ 60-79% L-ICA stenosis & 1-39% R-ICA stenosis, patent vertebrals & subclavians... 11/2016 40-59% RICA stenosis, which has increased slightly from prior study. Stable 6025-36%ICA stenosis.Normal subclavian arteries, bilaterally   11/26/2016 Follow up : HTN, Hyperlipidemia , DM , GERD and CAD  Pt returns for a 6 month follow up for physical and fasting labs.  He has HTN and CAD . Doing well on Lopressor Twice daily  And ASA daily .  He follows with cardiology . Previous stent in 2008.   He has Hyperlipdemia on Lipitor and Zetia .  Has been at goal . Labs pending this morning  We discussed remaining active and low fat/cholesterol diet.  Works part time. Stay active  [place].   Has PVD with carotid stenosis . Recent Carotid doppler stable on left . Slight progression on right .   Has DM controlled on Metformin . Last A1 C at  goal  Discussed diet and exercise Fasting labs pending this am.   Has GERD, doing well on Nexium . No abd pain or gerd.   Needs flu shot today . Colon utd.   Has iron deficiency anemia , labs pending .   No Known Allergies  Immunization History  Administered Date(s) Administered  . Influenza Split 12/26/2011  . Influenza Whole 12/03/2008  . Influenza, High Dose Seasonal PF 11/21/2015  . Influenza,inj,Quad PF,6+ Mos 12/29/2012, 11/23/2013, 11/16/2014  . Tdap 05/11/2014    Past Medical History:  Diagnosis Date  . CAD (coronary artery disease)    DES.. 2004 /  catheterization 2008, 10% narrowing stent site, pain not cardiac  . Colitis, ischemic ([place]  . Colon polyp   . Degenerative joint disease   . Diabetes mellitus 2012  . Diverticulosis   . Dyslipidemia   . Ejection fraction    65-70%, echo, March, 2012  . GERD (gastroesophageal reflux disease)   . Hyperlipidemia   . Hypertension   . Mitral regurgitation    Echo, mild, 2012  . Murmur   . Onychomycosis   . Overweight(278.02)   . Palpitations    Rapid heartbeat and shortness of breath after eating 2008 recurrent episode 2012    Tobacco History: History  Smoking Status  . Former Smoker  . Packs/day: 1.00  . Years: 26.00  . Types: Cigarettes  . Quit date: 03/05/1990  Smokeless Tobacco  . Never Used   Counseling given: Not Answered  Outpatient Encounter Prescriptions as of 11/26/2016  Medication Sig  . ALPRAZolam (XANAX) 0.5 MG tablet TAKE 1/2 TO 1 TABLET BY MOUTH THREE TIMES DAILY AS DIRECTED (Patient taking differently: 3 (three) times daily as needed. TAKE 1/2 TO 1 TABLET BY MOUTH THREE TIMES DAILY AS DIRECTED)  . aspirin 81 MG EC tablet Take 1 tablet (81 mg total) by mouth daily.  Marland Kitchen atorvastatin (LIPITOR) 80 MG tablet Take 1 tablet (80 mg total) by mouth daily.  . Blood Glucose Monitoring Suppl (ONE TOUCH ULTRA SYSTEM KIT) W/DEVICE KIT 1 kit by Does not apply route once.  Marland Kitchen esomeprazole (NEXIUM) 40 MG  capsule Take 1 capsule (40 mg total) by mouth daily. Take 30 minutes before a meal.  . metFORMIN (GLUCOPHAGE) 500 MG tablet Take 1 tablet (500 mg total) by mouth 3 (three) times daily.  . methocarbamol (ROBAXIN) 500 MG tablet Take 1 tablet (500 mg total) by mouth 3 (three) times daily as needed for muscle spasms.  . metoprolol tartrate (LOPRESSOR) 50 MG tablet TAKE ONE TABLET BY MOUTH TWICE DAILY  . nitroGLYCERIN (NITROSTAT) 0.4 MG SL tablet Place 1 tablet (0.4 mg total) under the tongue every 5 (five) minutes as needed.  . tamsulosin (FLOMAX) 0.4 MG CAPS capsule TAKE ONE CAPSULE BY MOUTH ONCE DAILY AFTER  SUPPER  . ezetimibe (ZETIA) 10 MG tablet Take 1 tablet (10 mg total) by mouth daily.   No facility-administered encounter medications on file as of 11/26/2016.      Review of Systems  Constitutional:   No  weight loss, night sweats,  Fevers, chills, fatigue, or  lassitude.  HEENT:   No headaches,  Difficulty swallowing,  Tooth/dental problems, or  Sore throat,                No sneezing, itching, ear ache, nasal congestion, post nasal drip,   CV:  No chest pain,  Orthopnea, PND, swelling in lower extremities, anasarca, dizziness, palpitations, syncope.   GI  No heartburn, indigestion, abdominal pain, nausea, vomiting, diarrhea, change in bowel habits, loss of appetite, bloody stools.   Resp: No shortness of breath with exertion or at rest.  No excess mucus, no productive cough,  No non-productive cough,  No coughing up of blood.  No change in color of mucus.  No wheezing.  No chest wall deformity  Skin: no rash or lesions.  GU: no dysuria, change in color of urine, no urgency or frequency.  No flank pain, no hematuria   MS:  No joint pain or swelling.  No decreased range of motion.  No back pain.    Physical Exam  BP 132/70 (BP Location: Left Arm, Patient Position: Sitting, Cuff Size: Normal)   Pulse 65   Temp 97.8 F (36.6 C) (Oral)   Ht _0  (1.676 m)   Wt 191 lb 4 oz  (86.8 kg)   SpO2 98%   BMI 30.87 kg/m   GEN: A/Ox3; pleasant , NAD, elderly    HEENT:  [place]/AT,  EACs-clear, TMs-wnl, NOSE-clear, THROAT-clear, no lesions, no postnasal drip or exudate noted.   NECK:  Supple w/ fair ROM; no JVD; faint bruit bilaterally  no thyromegaly or nodules palpated; no lymphadenopathy.    RESP  Clear  P & A; w/o, wheezes/ rales/ or rhonchi. no accessory muscle use, no dullness to percussion  CARD:  RRR, no m/r/g, no peripheral edema, pulses intact, no cyanosis or clubbing.  GI:   Soft & nt; nml bowel sounds; no organomegaly or masses  detected.   Musco: Warm bil, no deformities or joint swelling noted.   Neuro: alert, no focal deficits noted.    Skin: Warm, no lesions or rashes    Lab Results:  CBC    Component Value Date/Time   WBC 6.7 11/26/2016 0924   RBC 4.58 11/26/2016 0924   HGB 11.7 (L) 11/26/2016 0924   HCT 35.8 (L) 11/26/2016 0924   PLT 286.0 11/26/2016 0924   MCV 78.2 11/26/2016 0924   MCHC 32.8 11/26/2016 0924   RDW 15.4 11/26/2016 0924   LYMPHSABS 1.6 11/26/2016 0924   MONOABS 0.8 11/26/2016 0924   EOSABS 0.3 11/26/2016 0924   BASOSABS 0.1 11/26/2016 0924    BMET    Component Value Date/Time   NA 141 11/26/2016 0924   K 5.2 (H) 11/26/2016 0924   CL 102 11/26/2016 0924   CO2 30 11/26/2016 0924   GLUCOSE 127 (H) 11/26/2016 0924   BUN 12 11/26/2016 0924   CREATININE 0.70 11/26/2016 0924   CALCIUM 9.8 11/26/2016 0924   GFRNONAA 101.46 01/24/2010 1243   GFRAA 127 03/16/2008 0751    BNP No results found for: BNP  ProBNP No results found for: PROBNP  Imaging: No results found.   Assessment & Plan:   No problem-specific Assessment & Plan notes found for this encounter.     Rexene Edison, NP 11/26/2016

## 2016-11-26 NOTE — Addendum Note (Signed)
Addended by: Parke Poisson E on: 11/26/2016 05:12 PM   Modules accepted: Orders

## 2016-11-26 NOTE — Assessment & Plan Note (Signed)
Check labs to follow iron panel .  Cont on CBC .

## 2016-11-26 NOTE — Assessment & Plan Note (Signed)
Controlled on current regimen   Plan  Patient Instructions  Flu shot today  Low fat, cholesterol and sweet diet.  Exercise as tolerated.  Continue on current regimen  Follow up with Dr. Lenna Gilford  In 6 months and As needed

## 2016-11-26 NOTE — Patient Instructions (Signed)
Flu shot today  Low fat, cholesterol and sweet diet.  Exercise as tolerated.  Continue on current regimen  Follow up with Dr. Lenna Gilford  In 6 months and As needed

## 2016-11-26 NOTE — Assessment & Plan Note (Signed)
Cont on nexium and GERD diet  No changes

## 2016-11-26 NOTE — Assessment & Plan Note (Signed)
Controlled on Metformin  Labs pending  Diet and exercise.

## 2016-11-27 ENCOUNTER — Other Ambulatory Visit: Payer: Self-pay | Admitting: *Deleted

## 2016-11-27 DIAGNOSIS — I6523 Occlusion and stenosis of bilateral carotid arteries: Secondary | ICD-10-CM

## 2016-11-28 ENCOUNTER — Other Ambulatory Visit: Payer: Self-pay | Admitting: Pulmonary Disease

## 2016-11-28 DIAGNOSIS — D508 Other iron deficiency anemias: Secondary | ICD-10-CM

## 2016-11-28 NOTE — Progress Notes (Signed)
  Notes recorded by Noralee Space, MD on 11/27/2016 at 4:16 PM EDT I called pt w/ labs results: FLP ok on Atorva80+Zetia10-- reminded to take both regularly every day... Chems w/ BS=127, A1c=6.7 on Metform500Tid-- continue same med + better diet/ exerc/ wt reduction... CBC w/ Hg=11.7, mcv=78, low Iron & ferritin-- Fe defic & rec to start FeSO4 '325mg'$  one Bid w/ VitC500... Asked to check stool cards again & ret in 16mofor CBC, iron panel...     Stool cards placed in the mail for patient CBCD and iron panel ordered

## 2016-12-15 ENCOUNTER — Other Ambulatory Visit: Payer: Self-pay | Admitting: Pulmonary Disease

## 2016-12-17 ENCOUNTER — Telehealth: Payer: Self-pay | Admitting: Pulmonary Disease

## 2016-12-17 ENCOUNTER — Other Ambulatory Visit: Payer: Self-pay | Admitting: Pulmonary Disease

## 2016-12-17 NOTE — Telephone Encounter (Signed)
lmtcb X1 for pt- this rx was sent in earlier today to pharmacy.

## 2016-12-17 NOTE — Telephone Encounter (Signed)
Spoke to patient wife - advised rx was sent to pharmacy - nothing else needed -pr

## 2016-12-25 ENCOUNTER — Other Ambulatory Visit (INDEPENDENT_AMBULATORY_CARE_PROVIDER_SITE_OTHER): Payer: Medicare Other

## 2016-12-25 DIAGNOSIS — Z0000 Encounter for general adult medical examination without abnormal findings: Secondary | ICD-10-CM

## 2016-12-25 LAB — FECAL OCCULT BLOOD, IMMUNOCHEMICAL: Fecal Occult Bld: NEGATIVE

## 2016-12-31 ENCOUNTER — Other Ambulatory Visit (INDEPENDENT_AMBULATORY_CARE_PROVIDER_SITE_OTHER): Payer: Medicare Other

## 2016-12-31 DIAGNOSIS — D508 Other iron deficiency anemias: Secondary | ICD-10-CM | POA: Diagnosis not present

## 2016-12-31 LAB — CBC WITH DIFFERENTIAL/PLATELET
BASOS PCT: 0.5 % (ref 0.0–3.0)
Basophils Absolute: 0 10*3/uL (ref 0.0–0.1)
EOS ABS: 0.2 10*3/uL (ref 0.0–0.7)
Eosinophils Relative: 3 % (ref 0.0–5.0)
HCT: 39.9 % (ref 39.0–52.0)
HEMOGLOBIN: 13.3 g/dL (ref 13.0–17.0)
Lymphocytes Relative: 21.1 % (ref 12.0–46.0)
Lymphs Abs: 1.6 10*3/uL (ref 0.7–4.0)
MCHC: 33.4 g/dL (ref 30.0–36.0)
MCV: 82.1 fl (ref 78.0–100.0)
Monocytes Absolute: 0.7 10*3/uL (ref 0.1–1.0)
Monocytes Relative: 8.8 % (ref 3.0–12.0)
Neutro Abs: 5.1 10*3/uL (ref 1.4–7.7)
Neutrophils Relative %: 66.6 % (ref 43.0–77.0)
Platelets: 248 10*3/uL (ref 150.0–400.0)
RBC: 4.86 Mil/uL (ref 4.22–5.81)
RDW: 19.3 % — AB (ref 11.5–15.5)
WBC: 7.6 10*3/uL (ref 4.0–10.5)

## 2016-12-31 LAB — IBC PANEL
IRON: 59 ug/dL (ref 42–165)
Saturation Ratios: 14 % — ABNORMAL LOW (ref 20.0–50.0)
TRANSFERRIN: 301 mg/dL (ref 212.0–360.0)

## 2017-04-03 ENCOUNTER — Other Ambulatory Visit: Payer: Self-pay | Admitting: Pulmonary Disease

## 2017-04-17 ENCOUNTER — Telehealth: Payer: Self-pay | Admitting: Pulmonary Disease

## 2017-04-17 MED ORDER — METOPROLOL TARTRATE 50 MG PO TABS: 50.0000 mg | ORAL_TABLET | Freq: Two times a day (BID) | ORAL | 1 refills | Status: DC

## 2017-04-17 NOTE — Telephone Encounter (Signed)
Prescription sent to pharmacy for 90 day supply. Recent prescription sent for patient while he was out of town.

## 2017-05-27 ENCOUNTER — Ambulatory Visit (INDEPENDENT_AMBULATORY_CARE_PROVIDER_SITE_OTHER): Payer: Medicare Other

## 2017-05-27 ENCOUNTER — Ambulatory Visit (INDEPENDENT_AMBULATORY_CARE_PROVIDER_SITE_OTHER): Payer: Medicare Other | Admitting: Pulmonary Disease

## 2017-05-27 ENCOUNTER — Encounter: Payer: Self-pay | Admitting: Pulmonary Disease

## 2017-05-27 ENCOUNTER — Other Ambulatory Visit (INDEPENDENT_AMBULATORY_CARE_PROVIDER_SITE_OTHER): Payer: Medicare Other

## 2017-05-27 ENCOUNTER — Ambulatory Visit (INDEPENDENT_AMBULATORY_CARE_PROVIDER_SITE_OTHER)
Admission: RE | Admit: 2017-05-27 | Discharge: 2017-05-27 | Disposition: A | Discharge status: Home / Self Care | Payer: Medicare Other | Source: Ambulatory Visit | Attending: Pulmonary Disease | Admitting: Pulmonary Disease

## 2017-05-27 VITALS — BP 128/66 | HR 85 | Temp 98.2°F | Ht 66.0 in | Wt 188.2 lb

## 2017-05-27 DIAGNOSIS — E785 Hyperlipidemia, unspecified: Secondary | ICD-10-CM | POA: Diagnosis not present

## 2017-05-27 DIAGNOSIS — I6523 Occlusion and stenosis of bilateral carotid arteries: Secondary | ICD-10-CM

## 2017-05-27 DIAGNOSIS — I10 Essential (primary) hypertension: Secondary | ICD-10-CM

## 2017-05-27 DIAGNOSIS — M545 Low back pain, unspecified: Secondary | ICD-10-CM

## 2017-05-27 DIAGNOSIS — N401 Enlarged prostate with lower urinary tract symptoms: Secondary | ICD-10-CM

## 2017-05-27 DIAGNOSIS — Z23 Encounter for immunization: Secondary | ICD-10-CM

## 2017-05-27 DIAGNOSIS — Z862 Personal history of diseases of the blood and blood-forming organs and certain disorders involving the immune mechanism: Secondary | ICD-10-CM | POA: Diagnosis not present

## 2017-05-27 DIAGNOSIS — E119 Type 2 diabetes mellitus without complications: Secondary | ICD-10-CM | POA: Diagnosis not present

## 2017-05-27 DIAGNOSIS — K5730 Diverticulosis of large intestine without perforation or abscess without bleeding: Secondary | ICD-10-CM

## 2017-05-27 DIAGNOSIS — I2510 Atherosclerotic heart disease of native coronary artery without angina pectoris: Secondary | ICD-10-CM

## 2017-05-27 DIAGNOSIS — N138 Other obstructive and reflux uropathy: Secondary | ICD-10-CM

## 2017-05-27 DIAGNOSIS — K219 Gastro-esophageal reflux disease without esophagitis: Secondary | ICD-10-CM

## 2017-05-27 DIAGNOSIS — I6522 Occlusion and stenosis of left carotid artery: Secondary | ICD-10-CM | POA: Diagnosis not present

## 2017-05-27 DIAGNOSIS — M159 Polyosteoarthritis, unspecified: Secondary | ICD-10-CM

## 2017-05-27 DIAGNOSIS — M150 Primary generalized (osteo)arthritis: Secondary | ICD-10-CM | POA: Diagnosis not present

## 2017-05-27 DIAGNOSIS — G8929 Other chronic pain: Secondary | ICD-10-CM

## 2017-05-27 DIAGNOSIS — I340 Nonrheumatic mitral (valve) insufficiency: Secondary | ICD-10-CM

## 2017-05-27 DIAGNOSIS — F17210 Nicotine dependence, cigarettes, uncomplicated: Secondary | ICD-10-CM | POA: Diagnosis not present

## 2017-05-27 LAB — COMPREHENSIVE METABOLIC PANEL
ALK PHOS: 67 U/L (ref 39–117)
ALT: 31 U/L (ref 0–53)
AST: 22 U/L (ref 0–37)
Albumin: 4.2 g/dL (ref 3.5–5.2)
BUN: 13 mg/dL (ref 6–23)
CO2: 30 mEq/L (ref 19–32)
Calcium: 9.7 mg/dL (ref 8.4–10.5)
Chloride: 100 mEq/L (ref 96–112)
Creatinine, Ser: 0.6 mg/dL (ref 0.40–1.50)
GFR: 142.19 mL/min (ref >60.00)
GLUCOSE: 131 mg/dL — AB (ref 70–99)
Potassium: 4.7 mEq/L (ref 3.5–5.1)
Sodium: 139 mEq/L (ref 135–145)
TOTAL PROTEIN: 7 g/dL (ref 6.0–8.3)
Total Bilirubin: 0.6 mg/dL (ref 0.2–1.2)

## 2017-05-27 LAB — CBC WITH DIFFERENTIAL/PLATELET
Basophils Absolute: 0.1 10*3/uL (ref 0.0–0.1)
Basophils Relative: 1.2 % (ref 0.0–3.0)
EOS PCT: 3.1 % (ref 0.0–5.0)
Eosinophils Absolute: 0.2 10*3/uL (ref 0.0–0.7)
HCT: 41.4 % (ref 39.0–52.0)
Hemoglobin: 14.4 g/dL (ref 13.0–17.0)
LYMPHS ABS: 1.5 10*3/uL (ref 0.7–4.0)
Lymphocytes Relative: 18.9 % (ref 12.0–46.0)
MCHC: 34.9 g/dL (ref 30.0–36.0)
MCV: 85 fl (ref 78.0–100.0)
MONOS PCT: 9.6 % (ref 3.0–12.0)
Monocytes Absolute: 0.8 10*3/uL (ref 0.1–1.0)
NEUTROS ABS: 5.4 10*3/uL (ref 1.4–7.7)
NEUTROS PCT: 67.2 % (ref 43.0–77.0)
PLATELETS: 280 10*3/uL (ref 150.0–400.0)
RBC: 4.87 Mil/uL (ref 4.22–5.81)
RDW: 14.3 % (ref 11.5–15.5)
WBC: 8 10*3/uL (ref 4.0–10.5)

## 2017-05-27 LAB — HEMOGLOBIN A1C: Hgb A1c MFr Bld: 6.3 % (ref 4.6–6.5)

## 2017-05-27 LAB — LIPID PANEL
CHOL/HDL RATIO: 2
Cholesterol: 123 mg/dL (ref 0–200)
HDL: 51.2 mg/dL (ref >39.00)
LDL Cholesterol: 55 mg/dL (ref 0–99)
NONHDL: 71.92
Triglycerides: 86 mg/dL (ref 0.0–149.0)
VLDL: 17.2 mg/dL (ref 0.0–40.0)

## 2017-05-27 LAB — TSH: TSH: 1.95 u[IU]/mL (ref 0.35–4.50)

## 2017-05-27 LAB — PSA: PSA: 2.8 ng/mL (ref 0.10–4.00)

## 2017-05-27 MED ORDER — TAMSULOSIN HCL 0.4 MG PO CAPS: ORAL_CAPSULE | ORAL | 2 refills | Status: DC

## 2017-05-27 MED ORDER — METHOCARBAMOL 500 MG PO TABS: 500.0000 mg | ORAL_TABLET | Freq: Three times a day (TID) | ORAL | 0 refills | Status: DC | PRN

## 2017-05-27 NOTE — Patient Instructions (Signed)
Today we updated your med list in our EPIC system...    Continue your current medications the same...    We refilled the meds you requested...  Today we did a follow up CXR & your FASTING blood work...    We will contact you w/ the results when available...   We also gave you the PREVNAR-13 Pneumonia vaccine...    (you will need one more pneumonia shot= Pneumovax-23 later on return visit)...  Keep up the good work w/ diet & exercise...  Call for any questions...  Let's plan a follow up visit in 67mo, sooner if needed for problems.Marland KitchenMarland Kitchen

## 2017-05-27 NOTE — Progress Notes (Signed)
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 on LIPITOR '80mg'$ /d now... he stopped his prev Fish Oil supplements ("they gave me gas")...  ~  [place] 4/07 on diet showed TChol 271, TG 443, HDL 31, LDL 155... Rec~ restart ZOCOR 80... ~  [place] 3/09 on Simva80 showed TChol 181, TG 166, HDL 36, LDL 112... same med & get wt down (then lost 14#) ~  [place] 8/09 on Simva80 showed TChol 186, TG 254, HDL 37, LDL 127... rec- same med, better diet, may need Fibrate etc. ~  FLP 1/10 on Simva80 showed TChol 190, TG 118, HDL 39, LDL 127... rec> get wt down! ~  FLP 7/10 on Simva80 showed TChol 179, TG 121, HDL 48, LDL 107 ~  11/11: not fasting for labs today & we decided to switch to LIPITOR '40mg'$ /d. ~  FLP 4/13 ?on Lip40 showed TChol 230, TG 116, HDL 50, LDL 165... rec to take daily & incr to Lip80. ~  FLP 10/13 on Lip80 showed TChol 220, TG 138, HDL , LDL 163...  Known CAD w/ stent, Add [id number] => he never refilled this Rx due to $$... ~  FLP 4/14 on Lip80 showed TChol 227, TG 136, HDL 43, LDL 173... Reminded to take med regularly... ~  FLP 10/14 on Lip80 showed TChol 252, TG 115, HDL 48, LDL 196... Pharm confirms med noncompliance even though pt insists he's taking it regularly w/ these bad numbers... ~  FLP 5/15 on  Lip80 showed TChol 207, TG 109, HDL 48, LDL 137... We discussed lipid clinic referral... ~  FLP 3/16 on Lip80 showed TChol 256, TG 61, HDL 72, LDL 172... Pharm confirmed noncompliance w/ med refills; asked to take Atorva80 every day or switch to Cres40, he will decide... ~  [place] 9/16 on Lip80 w/ better compliance showed TChol 191, TG 107, HDL 48, LDL 122... ~  [place] 3/17 on Lip80 w/ better compliance showed TChol 168, TG 92, HDL 50, LDL 100  DIABETES MELLITUS, BORDERLINE (ICD-790.29) - started  meds 3/09 w/ METFORMIN 500- now on 1Bid. ~  labs 4/07 showed FBS=134, HgA1c=6.0.Marland KitchenMarland Kitchen ~  labs 3/09 showed BS= 140, HgA1c= 7.0.Marland KitchenMarland Kitchen Metformin started ~  labs 8/09 on Metform500Bid showed BS= 147, HgA1c= 6.5 ~  labs 1/10 on Metform500Bid showed BS= 144, A1c= 6.4.Marland Kitchen. rec> keep same. ~  labs 7/10 on Metform500Bid showed BS= 126, A1c= 6.2 ~  labs 11/11 on Metform500Bid showed BS= 108, A1c= 6.3 ~  Labs 4/13 on Metform500Bid showed BS= 107, A1c= 6.3 ~  Labs 10/13 on Metform500Bid showed BS= 110, A1c= not done. ~  Labs 4/14 on Metform500Bid showed BS= 111, A1c= 6.1 ~  Labs 10/14 on Metform500Bid showed BS=115, A1c=6.4 ~  Labs 5/15 on Metform500Bid showed BS= 122 ~  Labs 3/16 on Metform500Bid showed BS= 121, A1c= 6.5; continue same... ~  Labs 9/16 on Metform500Bid showed BS= 120, A1c= 6.3 ~  Labs 3/17 on Metform500Bid showed BS= 121, A1c= 6.4  OVERWEIGHT (ICD-278.02) - started at 212# Mar09... ~  8/09:  great job w/ diet and exercise program and weight down 14# to 198# ~  1/10:  unfortunately weight back up to 208# after the holidays. ~  7/10: weight down to 198# ~  11/11: weight = 202# ~  4/13: Weight = 199# ~  10/13: Weight = 197# ~  4/14: Weight = 193# ~  10/14:  Weight = 192# ~  5/15:  Weight = 189# ~  3/16:  Weight = 181# ~  9/16:  Weight = 193# ~  3/17:  Weight = 191#  Hx of GERD (ICD-530.81) - hx gastritis on EGD by [place] [facility] 8/04... he takes [place] daily & denies dysphagia, abd pain, GERD, n/ v/  etc... ? He has a brother w/ hx esophageal cancer (he is still alive)... ~  Abd Sonar 7/10 showed norm GB, norm ducts, incr liver echotexture c/w fatty liver dis, otherw norm- NAD.Marland Kitchen. ~  5/15: he switched to Nexium20 Kidd prn...  DIVERTICULOSIS OF COLON (ICD-562.10) - colonoscopy 9/06 by Demetra Shiner showed ischemic colitis- he needs a full diagnostic colonoscopy 68 y/o Bro had colon cancer=> but he now tells me that there is NO FamHx colon cancer.  ~  f/u colonoscopy 9/09 by DrPatterson showed divertics, polyp= polypoid mucosa w/o adenomatous change... f/u planned 37yr. ~  [facility]- [facility] & f/u planned 517yr([facility])...  Hx of ISCHEMIC COLITIS (ICD-557.9) - hospitalized 9/06 w/ lower GIB secondary to ischemic colitis (required 2u transfusion)...   POSITIVE FAMILY Hx of PROSTATE CANCER in Brother >>  ~  Labs 3/16 showed incr PSA to 3.10 which represents an incr in PSA velocity; we will recheck in 42m29mo  ~  Follow up PSA readings remain <4... ~  Labs 3/17 showed PSA= 2.45  DEGENERATIVE JOINT DISEASE (ICD-715.90) & ?CTS w/ wrist pain> wrist splint Rx Qhs helps... LOW BACK PAIN >> new symptom 6/15, no obvious trauma etc; he had pos SLR on right & is referred to [facility] + Rx w/ rest, hear, brace, [facility]idd Tid prn...  ONYCHOMYCOSIS (ICD-110.1) - Mar09 notes nail fungus on left hand and both feet... LAMISIL '250mg'$ /d started... hands resolved now and toes are improved.   Past Surgical History:  Procedure Laterality Date  . ANGIOPLASTY / STENTING FEMORAL  09/09/2002  . CARDIAC CATHETERIZATION    . COLONOSCOPY  2014   Dr ByrBary Castilla COLONOSCOPY  11/26/2007   Dr Patterson/POLYPOID COLONIC MUCOSA WITH HYPEREMIA AND  . COLONOSCOPY WITH PROPOFOL N/A 07/13/2016   Procedure: COLONOSCOPY WITH  PROPOFOL;  Surgeon: Robert Bellow, MD;  [facility] END[facility]COPY;  Service: Endoscopy;  Laterality: N/A;  . ESOPHAGOGASTRODUODENOSCOPY (EGD) WITH PROPOFOL N/A 07/13/2016   Procedure:  ESOPHAGOGASTRODUODENOSCOPY (EGD) WITH PROPOFOL;  Surgeon: Robert Bellow, MD;  Location: [facility] ENDOSCOPY;  Service: Endoscopy;  Laterality: N/A;  . UPPER GI ENDOSCOPY  11/26/2007   Dr Sharlett Iles    Outpatient Encounter Medications as of 05/27/2017  Medication Sig  . ALPRAZolam (XANAX) 0.5 MG tablet TAKE 1/2 TO 1 TABLET BY MOUTH THREE TIMES DAILY AS DIRECTED (Patient taking differently: 3 (three) times daily as needed. TAKE 1/2 TO 1 TABLET BY MOUTH THREE TIMES DAILY AS DIRECTED)  . aspirin 81 MG EC tablet Take 1 tablet (81 mg total) by mouth daily.  Marland Kitchen atorvastatin (LIPITOR) 80 MG tablet TAKE ONE TABLET BY MOUTH ONCE DAILY  . Blood Glucose Monitoring Suppl (ONE TOUCH ULTRA SYSTEM KIT) W/DEVICE KIT 1 kit by Does not apply route once.  Marland Kitchen esomeprazole (NEXIUM) 40 MG capsule Take 1 capsule (40 mg total) by mouth daily. Take 30 minutes before a meal.  . metFORMIN (GLUCOPHAGE) 500 MG tablet Take 1 tablet (500 mg total) by mouth 3 (three) times daily.  . methocarbamol ([facility] K[facility]) 500 MG tablet Take 1 tablet (500 mg total) by mouth 3 (three) times daily as needed for muscle spasms.  . metoprolol tartrate (LOPRESSOR) 50 MG tablet Take 1 tablet (50 mg total) by mouth 2 (two) times daily.  . nitroGLYCERIN (NITROSTAT) 0.4 MG SL tablet Place 1 tablet (0.4 mg total) under the tongue every 5 (five) minutes as needed.  . tamsulosin (FLOMAX) 0.4 MG CAPS capsule TAKE 1 CAPSULE BY MOUTH ONCE DAILY AFTER SUPPER  . [DISCONTINUED] esomeprazole (NEXIUM) 40 MG capsule Take 1 capsule (40 mg total) by mouth daily at 12 noon.  . [DISCONTINUED] methocarbamol (K[facility] Kidd) 500 MG tablet Take 1 tablet (500 mg total) by mouth 3 (three) times daily as needed for muscle spasms.  . [DISCONTINUED] tamsulosin (FLOMAX) 0.4 MG CAPS capsule TAKE 1 CAPSULE BY MOUTH ONCE DAILY AFTER SUPPER  . ezetimibe (ZETIA) 10 MG tablet Take 1 tablet (10 mg total) by mouth daily.   No facility-administered encounter medications on file as of 05/27/2017.      No Known Allergies    Family History  Problem Relation Age of Onset  . Heart failure Mother   . Heart disease Mother & Father   . Diabetes Father     father also had ASHD/ CABG  . Colon cancer Brother had large villous adenoma w/ focal invasion and surg from [facility] 63  . AAA (abdominal aortic aneurysm) Brother (passed away) 4    Immunization History  Administered Date(s) Administered  . Influenza Split 12/26/2011  . Influenza Whole 12/03/2008  . Influenza, High Dose Seasonal PF 11/21/2015, 11/26/2016  . Influenza,inj,Quad PF,6+ Mos 12/29/2012, 11/23/2013, 11/16/2014  . Pneumococcal Conjugate-13 05/27/2017  . Tdap 05/11/2014    Current Medications, Allergies, Past Medical History, Past Surgical History, Family History, and Social History were reviewed in [employer] record.   Review of Systems    The patient denies fever, chills, sweats, anorexia, fatigue, weakness, malaise, weight loss, sleep disorder, blurring, diplopia, eye irritation, eye discharge, vision loss, eye pain, photophobia, earache, ear discharge, tinnitus, decreased hearing, nasal congestion, nosebleeds, sore throat, hoarseness, chest pain, palpitations, syncope, dyspnea on exertion, orthopnea, PND, peripheral edema, cough, dyspnea at rest, excessive sputum, hemoptysis, wheezing, pleurisy, nausea, vomiting, diarrhea, constipation, change in bowel habits, abdominal pain, melena, hematochezia, jaundice, gas/bloating,  indigestion/heartburn, dysphagia, odynophagia, dysuria, hematuria, urinary frequency, urinary hesitancy, nocturia, incontinence, back pain, joint pain, joint swelling, muscle cramps, muscle weakness, stiffness, arthritis, sciatica, restless legs, leg pain at night, leg pain with exertion, rash, itching, dryness, suspicious lesions, paralysis, paresthesias, seizures, tremors, vertigo, transient blindness, frequent falls, frequent headaches, difficulty walking, depression, anxiety,  memory loss, confusion, cold intolerance, heat intolerance, polydipsia, polyphagia, polyuria, unusual weight change, abnormal bruising, bleeding, enlarged lymph nodes, urticaria, allergic rash, hay fever, and recurrent infections.     Objective:   Physical Exam     WD, Overweight, 67 y/o WM in NAD...  GENERAL:  Alert & oriented; pleasant & cooperative... HEENT:  [place]/AT, EOM-wnl, PERRLA, EACs-clear, TMs-wnl, NOSE-clear, THROAT-clear & wnl. NECK:  Supple w/ fairROM; no JVD; normal carotid impulses w/ faint left carotid bruit; no thyromegaly or nodules palpated; no lymphadenopathy. CHEST:  Clear to P & A; without wheezes/ rales/ or rhonchi. HEART:  Regular Rhythm;  gr 1/6 SEM without rubs or gallops detected... ABDOMEN:  Soft & min tender in RUQ; normal bowel sounds; no organomegaly or masses palpated... EXT: without deformities, mild arthritic changes; no varicose veins/ venous insuffic/ or edema. NEURO:  CN's intact;  no focal neuro deficits... DERM:  onychomycosis improved as noted...  RADIOLOGY DATA:  Reviewed in the EPIC EMR & discussed w/ the patient...  LABORATORY DATA:  Reviewed in the EPIC EMR & discussed w/ the patient...   Assessment:      05/27/17>  [facility] is improved- he has cut his Fe down to 2d/wk now & doing satis;  BP, chol, DM all doing well;  We refilled his meds but no changes required;  He knows to call for any problems and will f/u in 58mo..    HBP & LVH>  BP seems well controlled on BBlocker monotherapy; we reviewed no salt, & wt reduction...   CAD> on ASA81 & Metoprolol50Bid; he had unusual symptom complex that was evaluated & discussed w/ Cards; it seems to have resolved on it's own & he knows to avoid excess alcohol, caffeine, etc... 05/2016>  Referred to CARDS to re-establish for f/u care...  Hyperlipidemia> on Lip80; FLP shows poor numbers but [facility] is [facility]compliant [facility] to take the Lip80 every day to help prevent an  MI or stroke...  DM> on Metform500Bid now; BS 121, A1c 6.4 & we reviewed diet, exercise & wt reduction strategies...  Overweight> wt= 187# & he knows the importance of wt reduction...  GI> GERD, Divertics, Hx ischemic colitis> on Omep20 for reflux esoph; continue same.  Hx Rising PSA>  The PSA velocity has diminished w/ PSA=3.10 in 05/2014 & 3.27 in 11/2014, and f/u PSA 3/17= 2.45 & now 2.50  DJD, LBP>  [facility] [address] [facility]...     Plan:      Patient's Medications  New Prescriptions   No medications on file  Previous Medications   ALPRAZOLAM (XANAX) 0.5 MG TABLET    TAKE 1/2 TO 1 TABLET BY MOUTH THREE TIMES DAILY AS DIRECTED   ASPIRIN 81 MG EC TABLET    Take 1 tablet (81 mg total) by mouth daily.   ATORVASTATIN (LIPITOR) 80 MG TABLET    TAKE ONE TABLET BY MOUTH ONCE DAILY   BLOOD GLUCOSE MONITORING SUPPL (ONE TOUCH ULTRA SYSTEM KIT) W/DEVICE KIT    1 kit by Does not apply route once.   ESOMEPRAZOLE (NEXIUM) 40 MG CAPSULE  Take 1 capsule (40 mg total) by mouth daily. Take 30 minutes before a meal.   EZETIMIBE (ZETIA) 10 MG TABLET    Take 1 tablet (10 mg total) by mouth daily.   METFORMIN (GLUCOPHAGE) 500 MG TABLET    Take 1 tablet (500 mg total) by mouth 3 (three) times daily.   METOPROLOL TARTRATE (LOPRESSOR) 50 MG TABLET    Take 1 tablet (50 mg total) by mouth 2 (two) times daily.   NITROGLYCERIN (NITROSTAT) 0.4 MG SL TABLET    Place 1 tablet (0.4 mg total) under the tongue every 5 (five) minutes as needed.  Modified Medications   Modified Medication Previous Medication   METHOCARBAMOL ([facility] Kidd) 500 MG TABLET methocarbamol (Kurt Kidd) 500 MG tablet      Take 1 tablet (500 mg total) by mouth 3 (three) times daily as needed for muscle spasms.    Take 1 tablet (500 mg total) by mouth 3 (three) times daily as needed for muscle spasms.   TAMSULOSIN (FLOMAX) 0.4 MG CAPS CAPSULE tamsulosin (FLOMAX) 0.4 MG CAPS capsule      TAKE 1 CAPSULE BY MOUTH ONCE  DAILY AFTER SUPPER    TAKE 1 CAPSULE BY MOUTH ONCE DAILY AFTER SUPPER  Discontinued Medications   ESOMEPRAZOLE (NEXIUM) 40 MG CAPSULE    Take 1 capsule (40 mg total) by mouth daily at 12 noon.

## 2017-05-28 LAB — IRON,TIBC AND FERRITIN PANEL
%SAT: 20 % (calc) (ref 15–60)
Ferritin: 39 ng/mL (ref 20–380)
Iron: 78 ug/dL (ref 50–180)
TIBC: 398 mcg/dL (calc) (ref 250–425)

## 2017-07-03 ENCOUNTER — Other Ambulatory Visit: Payer: Self-pay | Admitting: Internal Medicine

## 2017-07-04 NOTE — Telephone Encounter (Signed)
REFILL 

## 2017-09-13 ENCOUNTER — Other Ambulatory Visit: Payer: Self-pay | Admitting: Pulmonary Disease

## 2017-10-07 ENCOUNTER — Other Ambulatory Visit: Payer: Self-pay | Admitting: Internal Medicine

## 2017-11-25 ENCOUNTER — Ambulatory Visit (HOSPITAL_COMMUNITY)
Admission: RE | Admit: 2017-11-25 | Discharge: 2017-11-25 | Disposition: A | Discharge status: Home / Self Care | Payer: Medicare Other | Source: Ambulatory Visit | Attending: Cardiovascular Disease | Admitting: Cardiovascular Disease

## 2017-11-25 DIAGNOSIS — I6523 Occlusion and stenosis of bilateral carotid arteries: Secondary | ICD-10-CM | POA: Diagnosis not present

## 2017-11-26 ENCOUNTER — Other Ambulatory Visit: Payer: Self-pay | Admitting: Internal Medicine

## 2017-11-29 ENCOUNTER — Other Ambulatory Visit: Payer: Self-pay | Admitting: *Deleted

## 2017-11-29 DIAGNOSIS — I6523 Occlusion and stenosis of bilateral carotid arteries: Secondary | ICD-10-CM

## 2017-12-02 ENCOUNTER — Other Ambulatory Visit (INDEPENDENT_AMBULATORY_CARE_PROVIDER_SITE_OTHER): Payer: Medicare Other

## 2017-12-02 ENCOUNTER — Ambulatory Visit (INDEPENDENT_AMBULATORY_CARE_PROVIDER_SITE_OTHER): Payer: Medicare Other | Admitting: Pulmonary Disease

## 2017-12-02 ENCOUNTER — Encounter: Payer: Self-pay | Admitting: Pulmonary Disease

## 2017-12-02 ENCOUNTER — Ambulatory Visit (INDEPENDENT_AMBULATORY_CARE_PROVIDER_SITE_OTHER): Payer: Medicare Other

## 2017-12-02 VITALS — BP 126/62 | HR 78 | Temp 97.5°F | Ht 66.0 in | Wt 194.4 lb

## 2017-12-02 DIAGNOSIS — I6523 Occlusion and stenosis of bilateral carotid arteries: Secondary | ICD-10-CM

## 2017-12-02 DIAGNOSIS — K5790 Diverticulosis of intestine, part unspecified, without perforation or abscess without bleeding: Secondary | ICD-10-CM

## 2017-12-02 DIAGNOSIS — M545 Low back pain: Secondary | ICD-10-CM

## 2017-12-02 DIAGNOSIS — K219 Gastro-esophageal reflux disease without esophagitis: Secondary | ICD-10-CM | POA: Diagnosis not present

## 2017-12-02 DIAGNOSIS — I340 Nonrheumatic mitral (valve) insufficiency: Secondary | ICD-10-CM | POA: Diagnosis not present

## 2017-12-02 DIAGNOSIS — I2510 Atherosclerotic heart disease of native coronary artery without angina pectoris: Secondary | ICD-10-CM

## 2017-12-02 DIAGNOSIS — E119 Type 2 diabetes mellitus without complications: Secondary | ICD-10-CM | POA: Diagnosis not present

## 2017-12-02 DIAGNOSIS — Z23 Encounter for immunization: Secondary | ICD-10-CM

## 2017-12-02 DIAGNOSIS — M150 Primary generalized (osteo)arthritis: Secondary | ICD-10-CM

## 2017-12-02 DIAGNOSIS — I10 Essential (primary) hypertension: Secondary | ICD-10-CM | POA: Diagnosis not present

## 2017-12-02 DIAGNOSIS — G8929 Other chronic pain: Secondary | ICD-10-CM

## 2017-12-02 DIAGNOSIS — E785 Hyperlipidemia, unspecified: Secondary | ICD-10-CM

## 2017-12-02 DIAGNOSIS — Z862 Personal history of diseases of the blood and blood-forming organs and certain disorders involving the immune mechanism: Secondary | ICD-10-CM

## 2017-12-02 DIAGNOSIS — M159 Polyosteoarthritis, unspecified: Secondary | ICD-10-CM

## 2017-12-02 DIAGNOSIS — N401 Enlarged prostate with lower urinary tract symptoms: Secondary | ICD-10-CM

## 2017-12-02 DIAGNOSIS — N138 Other obstructive and reflux uropathy: Secondary | ICD-10-CM

## 2017-12-02 LAB — BASIC METABOLIC PANEL
BUN: 13 mg/dL (ref 6–23)
CHLORIDE: 101 meq/L (ref 96–112)
CO2: 29 mEq/L (ref 19–32)
CREATININE: 0.68 mg/dL (ref 0.40–1.50)
Calcium: 9.7 mg/dL (ref 8.4–10.5)
GFR: 122.87 mL/min (ref >60.00)
Glucose, Bld: 130 mg/dL — ABNORMAL HIGH (ref 70–99)
POTASSIUM: 4.6 meq/L (ref 3.5–5.1)
Sodium: 139 mEq/L (ref 135–145)

## 2017-12-02 LAB — LIPID PANEL
Cholesterol: 147 mg/dL (ref 0–200)
HDL: 47.2 mg/dL (ref >39.00)
LDL Cholesterol: 80 mg/dL (ref 0–99)
NonHDL: 99.57
Total CHOL/HDL Ratio: 3
Triglycerides: 97 mg/dL (ref 0.0–149.0)
VLDL: 19.4 mg/dL (ref 0.0–40.0)

## 2017-12-02 LAB — HEMOGLOBIN A1C: Hgb A1c MFr Bld: 6.8 % — ABNORMAL HIGH (ref 4.6–6.5)

## 2017-12-02 NOTE — Progress Notes (Signed)
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 '80mg'$ /d now... he stopped his prev Fish Oil supplements ("they gave me gas")...  ~  [place] 4/07 on diet showed TChol 271, TG 443, HDL 31, LDL 155... Rec~ restart ZOCOR 80... ~  [place] 3/09 on Simva80 showed TChol 181, TG 166, HDL 36, LDL 112... same med & get wt down (then lost 14#) ~  [place] 8/09 on Simva80 showed TChol 186, TG 254, HDL 37, LDL 127... rec- same med, better diet, may need Fibrate etc. ~  FLP 1/10 on Simva80 showed TChol 190, TG 118, HDL 39, LDL 127... rec> get wt down! ~  FLP 7/10 on Simva80 showed TChol 179, TG 121, HDL 48, LDL 107 ~  11/11: not fasting for labs today & we decided to switch to LIPITOR '40mg'$ /d. ~  FLP 4/13 ?on Lip40 showed TChol 230, TG 116, HDL 50, LDL 165... rec to take daily & incr to Lip80. ~  FLP 10/13 on Lip80 showed TChol 220, TG 138, HDL , LDL 163...  Known CAD w/ stent, Add ZETIA10 => he never refilled this Rx due to $$... ~  FLP 4/14 on Lip80 showed TChol 227, TG 136, HDL 43, LDL 173... Reminded to take med regularly... ~  FLP 10/14 on Lip80 showed TChol 252, TG 115, HDL 48, LDL 196... Pharm confirms med noncompliance even though pt  insists he's taking it regularly w/ these bad numbers... ~  FLP 5/15 on Lip80 showed TChol 207, TG 109, HDL 48, LDL 137... We discussed lipid clinic referral... ~  FLP 3/16 on Lip80 showed TChol 256, TG 61, HDL 72, LDL 172... Pharm confirmed noncompliance w/ med refills; asked to take Atorva80 every day or switch to Cres40, he will decide... ~  [place] 9/16 on Lip80 w/ better compliance showed TChol 191, TG 107, HDL 48, LDL 122... ~  [place] 3/17  on Lip80 w/ better compliance showed TChol 168, TG 92, HDL 50, LDL 100  DIABETES MELLITUS, BORDERLINE (ICD-790.29) - started meds 3/09 w/ METFORMIN 500- now on 1Bid. ~  labs 4/07 showed FBS=134, HgA1c=6.0.Marland KitchenMarland Kitchen ~  labs 3/09 showed BS= 140, HgA1c= 7.0.Marland KitchenMarland Kitchen Metformin started ~  labs 8/09 on Metform500Bid showed BS= 147, HgA1c= 6.5 ~  labs 1/10 on Metform500Bid showed BS= 144, A1c= 6.4.Marland Kitchen. rec> keep same. ~  labs 7/10 on Metform500Bid showed BS= 126, A1c= 6.2 ~  labs 11/11 on Metform500Bid showed BS= 108, A1c= 6.3 ~  Labs 4/13 on Metform500Bid showed BS= 107, A1c= 6.3 ~  Labs 10/13 on Metform500Bid showed BS= 110, A1c= not done. ~  Labs 4/14 on Metform500Bid showed BS= 111, A1c= 6.1 ~  Labs 10/14 on Metform500Bid showed BS=115, A1c=6.4 ~  Labs 5/15 on Metform500Bid showed BS= 122 ~  Labs 3/16 on Metform500Bid showed BS= 121, A1c= 6.5; continue same... ~  Labs 9/16 on Metform500Bid showed BS= 120, A1c= 6.3 ~  Labs 3/17 on Metform500Bid showed BS= 121, A1c= 6.4  OVERWEIGHT (ICD-278.02) - started at 212# Mar09... ~  8/09:  great job w/ diet and exercise program and weight down 14# to 198# ~  1/10:  unfortunately weight back up to 208# after the holidays. ~  7/10: weight down to 198# ~  11/11: weight = 202# ~  4/13: Weight = 199# ~  10/13: Weight = 197# ~  4/14: Weight = 193# ~  10/14:  Weight = 192# ~  5/15:  Weight = 189# ~  3/16:  Weight = 181# ~  9/16:  Weight = 193# ~  3/17:  Weight = 191#  Hx of GERD (ICD-530.81) - hx gastritis on EGD by [facility]  8/04... he takes [place] daily & denies dysphagia, abd pain, GERD, n/ v/ etc... ? He has a brother w/ hx esophageal cancer (he is still alive)... ~  Abd Sonar 7/10 showed norm GB, norm ducts, incr liver echotexture c/w fatty liver dis, otherw norm- NAD.Marland Kitchen. ~  5/15: he switched to Nexium20 OTC prn...  DIVERTICULOSIS OF COLON (ICD-562.10) - colonoscopy 9/06 by Demetra Shiner showed ischemic colitis- he needs a full diagnostic colonoscopy 69 y/o Bro had colon cancer=> but he now tells me that there is NO FamHx colon cancer.  ~  f/u colonoscopy 9/09 by DrPatterson showed divertics, polyp= polypoid mucosa w/o adenomatous change... f/u planned 7yr. ~  [facility]- [facility] & f/u planned 587yr(DrByrnett)...  Hx of ISCHEMIC COLITIS (ICD-557.9) - hospitalized 9/06 w/ lower GIB secondary to ischemic colitis (required 2u transfusion)...   POSITIVE FAMILY Hx of PROSTATE CANCER in Brother >>  ~  Labs 3/16 showed incr PSA to 3.10 which represents an incr in PSA velocity; we will recheck in 67m367mo  ~  Follow up PSA readings remain <4... ~  Labs 3/17 showed PSA= 2.45  DEGENERATIVE JOINT DISEASE (ICD-715.90) & ?CTS w/ wrist pain> wrist splint Rx Qhs helps... LOW BACK PAIN >> new symptom 6/15, no obvious trauma etc; he had pos SLR on right & is referred to Ortho + Rx w/ rest, hear, brace, Robaxin Tid prn...  ONYCHOMYCOSIS (ICD-110.1) - Mar09 notes nail fungus on left hand and both feet... LAMISIL '250mg'$ /d started... hands resolved now and toes are improved.   Past Surgical History:  Procedure Laterality Date  . ANGIOPLASTY / STENTING FEMORAL  09/09/2002  . CARDIAC CATHETERIZATION    . COLONOSCOPY  2014   Dr ByrBary Castilla COLONOSCOPY  11/26/2007  Dr Patterson/POLYPOID COLONIC MUCOSA WITH HYPEREMIA AND  . COLONOSCOPY WITH PROPOFOL N/A 07/13/2016   Procedure: COLONOSCOPY WITH PROPOFOL;  Surgeon: Robert Bellow, MD;  Location: [facility] ENDOSCOPY;  Service: Endoscopy;  Laterality: N/A;  .  ESOPHAGOGASTRODUODENOSCOPY (EGD) WITH PROPOFOL N/A 07/13/2016   Procedure: ESOPHAGOGASTRODUODENOSCOPY (EGD) WITH PROPOFOL;  Surgeon: Robert Bellow, MD;  Location: [facility] ENDOSCOPY;  Service: Endoscopy;  Laterality: N/A;  . UPPER GI ENDOSCOPY  11/26/2007   Dr Sharlett Iles    Outpatient Encounter Medications as of 12/02/2017  Medication Sig  . ALPRAZolam (XANAX) 0.5 MG tablet TAKE 1/2 TO 1 TABLET BY MOUTH THREE TIMES DAILY AS DIRECTED (Patient taking differently: 3 (three) times daily as needed. TAKE 1/2 TO 1 TABLET BY MOUTH THREE TIMES DAILY AS DIRECTED)  . aspirin 81 MG EC tablet Take 1 tablet (81 mg total) by mouth daily.  Marland Kitchen atorvastatin (LIPITOR) 80 MG tablet TAKE ONE TABLET BY MOUTH ONCE DAILY  . Blood Glucose Monitoring Suppl (ONE TOUCH ULTRA SYSTEM KIT) W/DEVICE KIT 1 kit by Does not apply route once.  Marland Kitchen esomeprazole (NEXIUM) 40 MG capsule Take 1 capsule (40 mg total) by mouth daily. Take 30 minutes before a meal.  . ezetimibe (ZETIA) 10 MG tablet TAKE 1 TABLET BY MOUTH ONCE DAILY NEED  OFFICE  VISIT  TO  CONITINUE  REFILLS  . metFORMIN (GLUCOPHAGE) 500 MG tablet TAKE 1 TABLET BY MOUTH THREE TIMES DAILY  . methocarbamol (ROBAXIN) 500 MG tablet Take 1 tablet (500 mg total) by mouth 3 (three) times daily as needed for muscle spasms.  . metoprolol tartrate (LOPRESSOR) 50 MG tablet Take 1 tablet (50 mg total) by mouth 2 (two) times daily.  . nitroGLYCERIN (NITROSTAT) 0.4 MG SL tablet Place 1 tablet (0.4 mg total) under the tongue every 5 (five) minutes as needed.  . tamsulosin (FLOMAX) 0.4 MG CAPS capsule TAKE 1 CAPSULE BY MOUTH ONCE DAILY AFTER SUPPER   No facility-administered encounter medications on file as of 12/02/2017.     No Known Allergies    Family History  Problem Relation Age of Onset  . Heart failure Mother   . Heart disease Mother & Father   . Diabetes Father     father also had ASHD/ CABG  . Colon cancer Brother had large villous adenoma w/ focal invasion and surg from  DrByrnett 63  . AAA (abdominal aortic aneurysm) Brother (passed away) 33    Immunization History  Administered Date(s) Administered  . Influenza Split 12/26/2011  . Influenza Whole 12/03/2008  . Influenza, High Dose Seasonal PF 11/21/2015, 11/26/2016, 12/02/2017  . Influenza,inj,Quad PF,6+ Mos 12/29/2012, 11/23/2013, 11/16/2014  . Pneumococcal Conjugate-13 05/27/2017  . Tdap 05/11/2014    Current Medications, Allergies, Past Medical History, Past Surgical History, Family History, and Social History were reviewed in [employer] record.   Review of Systems    The patient denies fever, chills, sweats, anorexia, fatigue, weakness, malaise, weight loss, sleep disorder, blurring, diplopia, eye irritation, eye discharge, vision loss, eye pain, photophobia, earache, ear discharge, tinnitus, decreased hearing, nasal congestion, nosebleeds, sore throat, hoarseness, chest pain, palpitations, syncope, dyspnea on exertion, orthopnea, PND, peripheral edema, cough, dyspnea at rest, excessive sputum, hemoptysis, wheezing, pleurisy, nausea, vomiting, diarrhea, constipation, change in bowel habits, abdominal pain, melena, hematochezia, jaundice, gas/bloating, indigestion/heartburn, dysphagia, odynophagia, dysuria, hematuria, urinary frequency, urinary hesitancy, nocturia, incontinence, back pain, joint pain, joint swelling, muscle cramps, muscle weakness, stiffness, arthritis, sciatica, restless legs, leg pain at night, leg pain with exertion, rash, itching, dryness, suspicious  lesions, paralysis, paresthesias, seizures, tremors, vertigo, transient blindness, frequent falls, frequent headaches, difficulty walking, depression, anxiety, memory loss, confusion, cold intolerance, heat intolerance, polydipsia, polyphagia, polyuria, unusual weight change, abnormal bruising, bleeding, enlarged lymph nodes, urticaria, allergic rash, hay fever, and recurrent infections.     Objective:   Physical  Exam     WD, Overweight, 67 y/o WM in NAD...  GENERAL:  Alert & oriented; pleasant & cooperative... HEENT:  [place]/AT, EOM-wnl, PERRLA, EACs-clear, TMs-wnl, NOSE-clear, THROAT-clear & wnl. NECK:  Supple w/ fairROM; no JVD; normal carotid impulses w/ faint left carotid bruit; no thyromegaly or nodules palpated; no lymphadenopathy. CHEST:  Clear to P & A; without wheezes/ rales/ or rhonchi. HEART:  Regular Rhythm;  gr 1/6 SEM without rubs or gallops detected... ABDOMEN:  Soft & min tender in RUQ; normal bowel sounds; no organomegaly or masses palpated... EXT: without deformities, mild arthritic changes; no varicose veins/ venous insuffic/ or edema. NEURO:  CN's intact;  no focal neuro deficits... DERM:  onychomycosis improved as noted...  RADIOLOGY DATA:  Reviewed in the EPIC EMR & discussed w/ the patient...  LABORATORY DATA:  Reviewed in the EPIC EMR & discussed w/ the patient...   Assessment:      05/27/17>  Kurt Kidd is improved- he has cut his Fe down to 2d/wk now & doing satis;  BP, chol, DM all doing well;  We refilled his meds but no changes required;  He knows to call for any problems and will f/u in 55mo..    HBP & LVH>  BP seems well controlled on BBlocker monotherapy; we reviewed no salt, & wt reduction...   CAD> on ASA81 & Metoprolol50Bid; he had unusual symptom complex that was evaluated & discussed w/ Cards; it seems to have resolved on it's own & he knows to avoid excess alcohol, caffeine, etc... 05/2016>  Referred to CARDS to re-establish for f/u care...  Hyperlipidemia> on Lip80; FLP shows poor numbers but he is non-compliant [facility] asked to take the Lip80 every day to help prevent an MI or stroke...  DM> on Metform500Bid now; BS 121, A1c 6.4 & we reviewed diet, exercise & wt reduction strategies...  Overweight> wt= 187# & he knows the importance of wt reduction...  GI> GERD, Divertics, Hx ischemic colitis> on Omep20 for reflux esoph;  continue same.  Hx Rising PSA>  The PSA velocity has diminished w/ PSA=3.10 in 05/2014 & 3.27 in 11/2014, and f/u PSA 3/17= 2.45 & now 2.50  DJD, LBP>  [facility]...     Plan:      Patient's Medications  New Prescriptions   No medications on file  Previous Medications   ALPRAZOLAM (XANAX) 0.5 MG TABLET    TAKE 1/2 TO 1 TABLET BY MOUTH THREE TIMES DAILY AS DIRECTED   ASPIRIN 81 MG EC TABLET    Take 1 tablet (81 mg total) by mouth daily.   ATORVASTATIN (LIPITOR) 80 MG TABLET    TAKE ONE TABLET BY MOUTH ONCE DAILY   BLOOD GLUCOSE MONITORING SUPPL (ONE TOUCH ULTRA SYSTEM KIT) W/DEVICE KIT    1 kit by Does not apply route once.   ESOMEPRAZOLE (NEXIUM) 40 MG CAPSULE    Take 1 capsule (40 mg total) by mouth daily. Take 30 minutes before a meal.   EZETIMIBE (ZETIA) 10 MG TABLET    TAKE 1 TABLET BY MOUTH ONCE DAILY NEED  OFFICE  VISIT  TO  CONITINUE  REFILLS   METFORMIN (GLUCOPHAGE) 500 MG TABLET    TAKE 1 TABLET BY MOUTH THREE TIMES DAILY   METHOCARBAMOL (ROBAXIN) 500 MG TABLET    Take 1 tablet (500 mg total) by mouth 3 (three) times daily as needed for muscle spasms.   METOPROLOL TARTRATE (LOPRESSOR) 50 MG TABLET    Take 1 tablet (50 mg total) by mouth 2 (two) times daily.   NITROGLYCERIN (NITROSTAT) 0.4 MG SL TABLET    Place 1 tablet (0.4 mg total) under the tongue every 5 (five) minutes as needed.   TAMSULOSIN (FLOMAX) 0.4 MG CAPS CAPSULE    TAKE 1 CAPSULE BY MOUTH ONCE DAILY AFTER SUPPER  Modified Medications   No medications on file  Discontinued Medications   No medications on file

## 2017-12-02 NOTE — Patient Instructions (Addendum)
Today we updated your med list in our EPIC system...    Continue your current medications the same...  Today we checked your follow up FASTING blood work...    We will contact you w/ the results when available...   Keep up the good work w/ diet (low carb, low fat) and exercise...  We gave your the 2019 FLU vaccine today...  Call for any questions or if we can be of service in any way...  [name],  It has been my great honor to have been your doctor over these many years...    Wishing you & your family good health & much happiness in the years to come...  We discussed strategies for finding a replacement primary care doctor--     Contact your brother's doc & see if he will take you on!!!

## 2017-12-16 ENCOUNTER — Telehealth: Payer: Self-pay | Admitting: Pulmonary Disease

## 2017-12-16 MED ORDER — TAMSULOSIN HCL 0.4 MG PO CAPS: ORAL_CAPSULE | ORAL | 2 refills | Status: DC

## 2017-12-16 MED ORDER — ATORVASTATIN CALCIUM 80 MG PO TABS: 80.0000 mg | ORAL_TABLET | Freq: Every day | ORAL | 2 refills | Status: DC

## 2017-12-16 MED ORDER — EZETIMIBE 10 MG PO TABS: 10.0000 mg | ORAL_TABLET | Freq: Every day | ORAL | 2 refills | Status: DC

## 2017-12-16 MED ORDER — METOPROLOL TARTRATE 50 MG PO TABS: 50.0000 mg | ORAL_TABLET | Freq: Two times a day (BID) | ORAL | 1 refills | Status: DC

## 2017-12-16 MED ORDER — METFORMIN HCL 500 MG PO TABS: 500.0000 mg | ORAL_TABLET | Freq: Three times a day (TID) | ORAL | 2 refills | Status: DC

## 2017-12-16 NOTE — Telephone Encounter (Signed)
Called and spoke with Jackelyn Poling, Patient's wife.  He was needing refills on medications, Zetia, metformin,lipitor,metoprolol, and flomax, 90 day supplies, sent to [facility].  Prescriptions with 90 day supplies, with refills, sent to preferred pharmacy.  Nothing further at this time.

## 2018-01-09 ENCOUNTER — Telehealth: Payer: Self-pay | Admitting: Pulmonary Disease

## 2018-01-09 NOTE — Telephone Encounter (Signed)
Attempted to call Patient.  Left message for him to call back.

## 2018-01-10 NOTE — Telephone Encounter (Signed)
Pt returning call from yesterday and can be reached @ [phone].Kurt Kidd

## 2018-01-10 NOTE — Telephone Encounter (Signed)
Spoke with pt's wife and gave her other options for PCP that are taking new patients. Nothing further is needed  [place]

## 2018-02-17 ENCOUNTER — Encounter: Payer: Self-pay | Admitting: Family Medicine

## 2018-02-17 ENCOUNTER — Ambulatory Visit (INDEPENDENT_AMBULATORY_CARE_PROVIDER_SITE_OTHER): Payer: Medicare Other | Admitting: Family Medicine

## 2018-02-17 VITALS — BP 122/78 | HR 71 | Ht 66.0 in | Wt 197.0 lb

## 2018-02-17 DIAGNOSIS — E119 Type 2 diabetes mellitus without complications: Secondary | ICD-10-CM

## 2018-02-17 DIAGNOSIS — I2510 Atherosclerotic heart disease of native coronary artery without angina pectoris: Secondary | ICD-10-CM | POA: Diagnosis not present

## 2018-02-17 DIAGNOSIS — Z862 Personal history of diseases of the blood and blood-forming organs and certain disorders involving the immune mechanism: Secondary | ICD-10-CM | POA: Diagnosis not present

## 2018-02-17 DIAGNOSIS — N401 Enlarged prostate with lower urinary tract symptoms: Secondary | ICD-10-CM | POA: Diagnosis not present

## 2018-02-17 DIAGNOSIS — R3911 Hesitancy of micturition: Secondary | ICD-10-CM | POA: Diagnosis not present

## 2018-02-17 DIAGNOSIS — I6523 Occlusion and stenosis of bilateral carotid arteries: Secondary | ICD-10-CM

## 2018-02-17 DIAGNOSIS — H11001 Unspecified pterygium of right eye: Secondary | ICD-10-CM

## 2018-02-17 DIAGNOSIS — I10 Essential (primary) hypertension: Secondary | ICD-10-CM | POA: Diagnosis not present

## 2018-02-17 DIAGNOSIS — E782 Mixed hyperlipidemia: Secondary | ICD-10-CM | POA: Diagnosis not present

## 2018-02-17 LAB — MICROALBUMIN / CREATININE URINE RATIO
Creatinine,U: 51.9 mg/dL
MICROALB UR: 3.9 mg/dL — AB (ref 0.0–1.9)
Microalb Creat Ratio: 7.5 mg/g (ref 0.0–30.0)

## 2018-02-17 LAB — LIPID PANEL
CHOLESTEROL: 133 mg/dL (ref 0–200)
HDL: 46.7 mg/dL (ref >39.00)
LDL CALC: 61 mg/dL (ref 0–99)
NonHDL: 86.59
TRIGLYCERIDES: 126 mg/dL (ref 0.0–149.0)
Total CHOL/HDL Ratio: 3
VLDL: 25.2 mg/dL (ref 0.0–40.0)

## 2018-02-17 LAB — URINALYSIS, ROUTINE W REFLEX MICROSCOPIC
Bilirubin Urine: NEGATIVE
Hgb urine dipstick: NEGATIVE
Ketones, ur: NEGATIVE
Leukocytes, UA: NEGATIVE
Nitrite: NEGATIVE
SPECIFIC GRAVITY, URINE: 1.01 (ref 1.000–1.030)
TOTAL PROTEIN, URINE-UPE24: NEGATIVE
URINE GLUCOSE: NEGATIVE
UROBILINOGEN UA: 0.2 (ref 0.0–1.0)
pH: 6.5 (ref 5.0–8.0)

## 2018-02-17 LAB — COMPREHENSIVE METABOLIC PANEL
ALT: 34 U/L (ref 0–53)
AST: 21 U/L (ref 0–37)
Albumin: 4.6 g/dL (ref 3.5–5.2)
Alkaline Phosphatase: 72 U/L (ref 39–117)
BUN: 11 mg/dL (ref 6–23)
CHLORIDE: 100 meq/L (ref 96–112)
CO2: 28 mEq/L (ref 19–32)
Calcium: 9.8 mg/dL (ref 8.4–10.5)
Creatinine, Ser: 0.66 mg/dL (ref 0.40–1.50)
GFR: 127.1 mL/min (ref >60.00)
GLUCOSE: 146 mg/dL — AB (ref 70–99)
POTASSIUM: 4.6 meq/L (ref 3.5–5.1)
Sodium: 138 mEq/L (ref 135–145)
Total Bilirubin: 0.6 mg/dL (ref 0.2–1.2)
Total Protein: 6.8 g/dL (ref 6.0–8.3)

## 2018-02-17 LAB — HEMOGLOBIN A1C: Hgb A1c MFr Bld: 6.6 % — ABNORMAL HIGH (ref 4.6–6.5)

## 2018-02-17 LAB — CBC
HCT: 41.9 % (ref 39.0–52.0)
HEMOGLOBIN: 14.6 g/dL (ref 13.0–17.0)
MCHC: 34.9 g/dL (ref 30.0–36.0)
MCV: 85.4 fl (ref 78.0–100.0)
Platelets: 285 10*3/uL (ref 150.0–400.0)
RBC: 4.9 Mil/uL (ref 4.22–5.81)
RDW: 13.9 % (ref 11.5–15.5)
WBC: 7.7 10*3/uL (ref 4.0–10.5)

## 2018-02-17 LAB — PSA: PSA: 2.59 ng/mL (ref 0.10–4.00)

## 2018-02-17 LAB — LDL CHOLESTEROL, DIRECT: Direct LDL: 76 mg/dL

## 2018-02-17 NOTE — Patient Instructions (Signed)
Heart-Healthy Eating Plan Heart-healthy meal planning includes:  Limiting unhealthy fats.  Increasing healthy fats.  Making other small dietary changes.  You may need to talk with your doctor or a diet specialist (dietitian) to create an eating plan that is right for you. What types of fat should I choose?  Choose healthy fats. These include olive oil and canola oil, flaxseeds, walnuts, almonds, and seeds.  Eat more omega-3 fats. These include salmon, mackerel, sardines, tuna, flaxseed oil, and ground flaxseeds. Try to eat fish at least twice each week.  Limit saturated fats. ? Saturated fats are often found in animal products, such as meats, butter, and cream. ? Plant sources of saturated fats include palm oil, palm kernel oil, and coconut oil.  Avoid foods with partially hydrogenated oils in them. These include stick margarine, some tub margarines, cookies, crackers, and other baked goods. These contain trans fats. What general guidelines do I need to follow?  Check food labels carefully. Identify foods with trans fats or high amounts of saturated fat.  Fill one half of your plate with vegetables and green salads. Eat 4-5 servings of vegetables per day. A serving of vegetables is: ? 1 cup of raw leafy vegetables. ?  cup of raw or cooked cut-up vegetables. ?  cup of vegetable juice.  Fill one fourth of your plate with whole grains. Look for the word "whole" as the first word in the ingredient list.  Fill one fourth of your plate with lean protein foods.  Eat 4-5 servings of fruit per day. A serving of fruit is: ? One medium whole fruit. ?  cup of dried fruit. ?  cup of fresh, frozen, or canned fruit. ?  cup of 100% fruit juice.  Eat more foods that contain soluble fiber. These include apples, broccoli, carrots, beans, peas, and barley. Try to get 20-30 g of fiber per day.  Eat more home-cooked food. Eat less restaurant, buffet, and fast food.  Limit or avoid  alcohol.  Limit foods high in starch and sugar.  Avoid fried foods.  Avoid frying your food. Try baking, boiling, grilling, or broiling it instead. You can also reduce fat by: ? Removing the skin from poultry. ? Removing all visible fats from meats. ? Skimming the fat off of stews, soups, and gravies before serving them. ? Steaming vegetables in water or broth.  Lose weight if you are overweight.  Eat 4-5 servings of nuts, legumes, and seeds per week: ? One serving of dried beans or legumes equals  cup after being cooked. ? One serving of nuts equals 1 ounces. ? One serving of seeds equals  ounce or one tablespoon.  You may need to keep track of how much salt or sodium you eat. This is especially true if you have high blood pressure. Talk with your doctor or dietitian to get more information. What foods can I eat? Grains Breads, including French, white, pita, wheat, raisin, rye, oatmeal, and Italian. Tortillas that are neither fried nor made with lard or trans fat. Low-fat rolls, including hotdog and hamburger buns and English muffins. Biscuits. Muffins. Waffles. Pancakes. Light popcorn. Whole-grain cereals. Flatbread. Melba toast. Pretzels. Breadsticks. Rusks. Low-fat snacks. Low-fat crackers, including oyster, saltine, matzo, graham, animal, and rye. Rice and pasta, including brown rice and pastas that are made with whole wheat. Vegetables All vegetables. Fruits All fruits, but limit coconut. Meats and Other Protein Sources Lean, well-trimmed beef, veal, pork, and lamb. Chicken and turkey without skin. All fish and shellfish.   Wild duck, rabbit, pheasant, and venison. Egg whites or low-cholesterol egg substitutes. Dried beans, peas, lentils, and tofu. Seeds and most nuts. Dairy Low-fat or nonfat cheeses, including ricotta, string, and mozzarella. Skim or 1% milk that is liquid, powdered, or evaporated. Buttermilk that is made with low-fat milk. Nonfat or low-fat  yogurt. Beverages Mineral water. Diet carbonated beverages. Sweets and Desserts Sherbets and fruit ices. Honey, jam, marmalade, jelly, and syrups. Meringues and gelatins. Pure sugar candy, such as hard candy, jelly beans, gumdrops, mints, marshmallows, and small amounts of dark chocolate. [employer] Eat all sweets and desserts in moderation. Fats and Oils Nonhydrogenated (trans-free) margarines. Vegetable oils, including soybean, sesame, sunflower, olive, peanut, safflower, corn, canola, and cottonseed. Salad dressings or mayonnaise made with a vegetable oil. Limit added fats and oils that you use for cooking, baking, salads, and as spreads. Other Cocoa powder. Coffee and tea. All seasonings and condiments. The items listed above may not be a complete list of recommended foods or beverages. Contact your dietitian for more options. What foods are not recommended? Grains Breads that are made with saturated or trans fats, oils, or whole milk. Croissants. Butter rolls. Cheese breads. Sweet rolls. Donuts. Buttered popcorn. Chow mein noodles. High-fat crackers, such as cheese or butter crackers. Meats and Other Protein Sources Fatty meats, such as hotdogs, short ribs, sausage, spareribs, bacon, rib eye roast or steak, and mutton. High-fat deli meats, such as salami and bologna. Caviar. Domestic duck and goose. Organ meats, such as kidney, liver, sweetbreads, and heart. Dairy Cream, sour cream, cream cheese, and creamed cottage cheese. Whole-milk cheeses, including blue (bleu), [place], [place], [place], American, [place], Swiss, cheddar, [place], and [place]. Whole or 2% milk that is liquid, evaporated, or condensed. Whole buttermilk. Cream sauce or high-fat cheese sauce. Yogurt that is made from whole milk. Beverages Regular sodas and juice drinks with added sugar. Sweets and Desserts Frosting. Pudding. Cookies. Cakes other than angel food cake. Candy that has milk chocolate or white  chocolate, hydrogenated fat, butter, coconut, or unknown ingredients. Buttered syrups. Full-fat ice cream or ice cream drinks. Fats and Oils Gravy that has suet, meat fat, or shortening. Cocoa butter, hydrogenated oils, palm oil, coconut oil, palm kernel oil. These can often be found in baked products, candy, fried foods, nondairy creamers, and whipped toppings. Solid fats and shortenings, including bacon fat, salt pork, lard, and butter. Nondairy cream substitutes, such as coffee creamers and sour cream substitutes. Salad dressings that are made of unknown oils, cheese, or sour cream. The items listed above may not be a complete list of foods and beverages to avoid. Contact your dietitian for more information. This information is not intended to replace advice given to you by your health care provider. Make sure you discuss any questions you have with your health care provider. Document Released: 08/21/2011 Document Revised: 07/28/2015 Document Reviewed: 08/13/2013 Elsevier Interactive Patient Education  2018 [employer].  Exercising to [employer] Exercising can help you to lose weight. In order to lose weight through exercise, you need to do vigorous-intensity exercise. You can tell that you are exercising with vigorous intensity if you are breathing very hard and fast and cannot hold a conversation while exercising. Moderate-intensity exercise helps to maintain your current weight. You can tell that you are exercising at a moderate level if you have a higher heart rate and faster breathing, but you are still able to hold a conversation. How often should I exercise? Choose an activity that you enjoy and  set realistic goals. Your health care provider can help you to make an activity plan that works for you. Exercise regularly as directed by your health care provider. This may include:  Doing resistance training twice each week, such as: ? Push-ups. ? Sit-ups. ? Lifting weights. ? Using  resistance bands.  Doing a given intensity of exercise for a given amount of time. Choose from these options: ? 150 minutes of moderate-intensity exercise every week. ? 75 minutes of vigorous-intensity exercise every week. ? A mix of moderate-intensity and vigorous-intensity exercise every week.  Children, pregnant women, people who are out of shape, people who are overweight, and older adults may need to consult a health care provider for individual recommendations. If you have any sort of medical condition, be sure to consult your health care provider before starting a new exercise program. What are some activities that can help me to lose weight?  Walking at a rate of at least 4.5 miles an hour.  Jogging or running at a rate of 5 miles per hour.  Biking at a rate of at least 10 miles per hour.  Lap swimming.  Roller-skating or in-line skating.  Cross-country skiing.  Vigorous competitive sports, such as football, basketball, and soccer.  Jumping rope.  Aerobic dancing. How can I be more active in my day-to-day activities?  Use the stairs instead of the elevator.  Take a walk during your lunch break.  If you drive, park your car farther away from work or school.  If you take public transportation, get off one stop early and walk the rest of the way.  Make all of your phone calls while standing up and walking around.  Get up, stretch, and walk around every 30 minutes throughout the day. What guidelines should I follow while exercising?  Do not exercise so much that you hurt yourself, feel dizzy, or get very short of breath.  Consult your health care provider prior to starting a new exercise program.  Wear comfortable clothes and shoes with good support.  Drink plenty of water while you exercise to prevent dehydration or heat stroke. Body water is lost during exercise and must be replaced.  Work out until you breathe faster and your heart beats faster. This  information is not intended to replace advice given to you by your health care provider. Make sure you discuss any questions you have with your health care provider. Document Released: 03/24/2010 Document Revised: 07/28/2015 Document Reviewed: 07/23/2013 Elsevier Interactive Patient Education  [employer].

## 2018-02-17 NOTE — Progress Notes (Signed)
Established Patient Office Visit  Subjective:  Patient ID: Kurt Kidd, male    DOB: 1950/11/24  Age: 67 y.o. MRN: [id number]  CC:  Chief Complaint  Patient presents with  . Establish Care    HPI Kurt Kidd presents for establishment of care by way of transfer from Dr. Lenna Gilford. He has been taking his medications as directed and has received his flu vaccine. He is fasting this morning; all he has had is black coffee.  Patient here today to establish care.  Significant past medical history of hypertension coronary artery disease and bilateral carotid stenosis.  He is status post coronary stents.  He is currently also seeing cardiology for these issues and has planned follow-up on Wednesday.  Diabetes has been controlled with metformin.  History of iron deficiency anemia status post acute GI bleed that required 2 units to correct.  No bleeding source was found.  Colonoscopy 1-1/2 years ago did show polyps advised to return in 5 years.  He has follow-up with the eye doctor first of the year.  History of BPH that is well controlled with Flomax.  He does not smoke.  He rarely drinks alcohol.  He lives with his wife.  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  Past Medical History:  Diagnosis Date  . CAD (coronary artery disease)    DES.. 2004 /  catheterization 2008, 10% narrowing stent site, pain not cardiac  . Colitis, ischemic ([place])   . Colon polyp   . Degenerative joint disease   . Diabetes mellitus 2012  . Diverticulosis   . Dyslipidemia   . Ejection fraction    65-70%, echo, March, 2012  . GERD (gastroesophageal reflux disease)   . Hyperlipidemia   . Hypertension   . Mitral regurgitation    Echo, mild, 2012  . Murmur   . Onychomycosis   . Overweight(278.02)   . Palpitations    Rapid heartbeat and shortness of breath after eating 2008 recurrent episode 2012    Past Surgical History:  Procedure Laterality Date  . ANGIOPLASTY / STENTING FEMORAL   09/09/2002  . CARDIAC CATHETERIZATION    . COLONOSCOPY  2014   Dr Bary Castilla  . COLONOSCOPY  11/26/2007   Dr Patterson/POLYPOID COLONIC MUCOSA WITH HYPEREMIA AND  . COLONOSCOPY WITH PROPOFOL N/A 07/13/2016   Procedure: COLONOSCOPY WITH PROPOFOL;  Surgeon: Robert Bellow, MD;  Location: [facility] ENDOSCOPY;  Service: Endoscopy;  Laterality: N/A;  . ESOPHAGOGASTRODUODENOSCOPY (EGD) WITH PROPOFOL N/A 07/13/2016   Procedure: ESOPHAGOGASTRODUODENOSCOPY (EGD) WITH PROPOFOL;  Surgeon: Robert Bellow, MD;  Location: [facility] ENDOSCOPY;  Service: Endoscopy;  Laterality: N/A;  . UPPER GI ENDOSCOPY  11/26/2007   Dr Sharlett Iles    Family History  Problem Relation Age of Onset  . Heart failure Mother   . Heart disease Mother   . Diabetes Father        father also had ASHD/ CABG  . Heart disease Father   . Colon cancer Brother 77  . AAA (abdominal aortic aneurysm) Brother 63    Social History   Socioeconomic History  . Marital status: Married    Spouse name: Not on file  . Number of children: Not on file  . Years of education: Not on file  . Highest education level: Not on file  Occupational History  . 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 Financial resource strain: Not on file  .  Food insecurity:    Worry: Not on file    Inability: Not on file  . Transportation needs:    Medical: Not on file    Non-medical: Not on file  Tobacco Use  . Smoking status: Former Smoker    Packs/day: 1.00    Years: 26.00    Pack years: 26.00    Types: Cigarettes    Last attempt to quit: 03/05/1990    Years since quitting: 27.9  . Smokeless tobacco: Never Used  Substance and Sexual Activity  . Alcohol use: Yes    Alcohol/week: 0.0 standard drinks    Comment: 2 drinks per day  . Drug use: No  . Sexual activity: Not on file  Lifestyle  . Physical activity:    Days per week: Not on file    Minutes per session: Not on file  . Stress: Not on file  Relationships   . Social connections:    Talks on phone: Not on file    Gets together: Not on file    Attends religious service: Not on file    Active member of club or organization: Not on file    Attends meetings of clubs or organizations: Not on file    Relationship status: Not on file  . Intimate partner violence:    Fear of current or ex partner: Not on file    Emotionally abused: Not on file    Physically abused: Not on file    Forced sexual activity: Not on file  Other Topics Concern  . Not on file  Social History Narrative  . Not on file    Outpatient Medications Prior to Visit  Medication Sig Dispense Refill  . ALPRAZolam (XANAX) 0.5 MG tablet TAKE 1/2 TO 1 TABLET BY MOUTH THREE TIMES DAILY AS DIRECTED (Patient taking differently: 3 (three) times daily as needed. TAKE 1/2 TO 1 TABLET BY MOUTH THREE TIMES DAILY AS DIRECTED) 90 tablet 5  . aspirin 81 MG EC tablet Take 1 tablet (81 mg total) by mouth daily. 30 tablet 0  . atorvastatin (LIPITOR) 80 MG tablet Take 1 tablet (80 mg total) by mouth daily. 90 tablet 2  . Blood Glucose Monitoring Suppl (ONE TOUCH ULTRA SYSTEM KIT) W/DEVICE KIT 1 kit by Does not apply route once. 1 each 0  . esomeprazole (NEXIUM) 40 MG capsule Take 1 capsule (40 mg total) by mouth daily. Take 30 minutes before a meal. 90 capsule 3  . ezetimibe (ZETIA) 10 MG tablet Take 1 tablet (10 mg total) by mouth daily. 90 tablet 2  . metFORMIN (GLUCOPHAGE) 500 MG tablet Take 1 tablet (500 mg total) by mouth 3 (three) times daily. 270 tablet 2  . methocarbamol (ROBAXIN) 500 MG tablet Take 1 tablet (500 mg total) by mouth 3 (three) times daily as needed for muscle spasms. 90 tablet 0  . metoprolol tartrate (LOPRESSOR) 50 MG tablet Take 1 tablet (50 mg total) by mouth 2 (two) times daily. 180 tablet 1  . nitroGLYCERIN (NITROSTAT) 0.4 MG SL tablet Place 1 tablet (0.4 mg total) under the tongue every 5 (five) minutes as needed. 25 tablet 1  . tamsulosin (FLOMAX) 0.4 MG CAPS capsule  TAKE 1 CAPSULE BY MOUTH ONCE DAILY AFTER SUPPER 90 capsule 2   No facility-administered medications prior to visit.     No Known Allergies  ROS Review of Systems  Constitutional: Negative.   HENT: Negative.   Eyes: Negative for photophobia and visual disturbance.  Respiratory: Negative.  Negative for  chest tightness and shortness of breath.   Cardiovascular: Negative.  Negative for chest pain.  Gastrointestinal: Negative.   Endocrine: Negative for polyphagia and polyuria.  Genitourinary: Negative for decreased urine volume and hematuria.  Musculoskeletal: Negative for gait problem and joint swelling.  Skin: Negative for pallor.  Allergic/Immunologic: Negative for immunocompromised state.  Neurological: Negative for seizures, light-headedness and numbness.  Hematological: Does not bruise/bleed easily.  Psychiatric/Behavioral: Negative.       Objective:    Physical Exam  Constitutional: He is oriented to person, place, and time. He appears well-developed and well-nourished. No distress.  HENT:  Head: Normocephalic.  Right Ear: External ear normal.  Left Ear: External ear normal.  Mouth/Throat: Oropharynx is clear and moist. No oropharyngeal exudate.  Eyes: Pupils are equal, round, and reactive to light. Conjunctivae are normal. Right eye exhibits no discharge. Left eye exhibits no discharge. No scleral icterus.    Neck: Neck supple. No JVD present. No tracheal deviation present. No thyromegaly present.  Cardiovascular: Normal rate.  Murmur heard.  Systolic murmur is present. Pulmonary/Chest: Effort normal and breath sounds normal. No stridor.  Abdominal: He exhibits no distension and no mass. There is no abdominal tenderness. There is no rebound and no guarding. A hernia is present. Hernia confirmed positive in the ventral area.  Genitourinary: Rectum:     Guaiac result negative.     No rectal mass, anal fissure, tenderness, external hemorrhoid, internal hemorrhoid or  abnormal anal tone.  Prostate is enlarged. Prostate is not tender.  Musculoskeletal:        General: No edema.  Lymphadenopathy:    He has no cervical adenopathy.  Neurological: He is alert and oriented to person, place, and time.  Skin: Skin is warm and dry. He is not diaphoretic.  Psychiatric: He has a normal mood and affect. His behavior is normal.    BP 122/78   Pulse 71   Ht 5' 6"  (1.676 m)   Wt 197 lb (89.4 kg)   SpO2 97%   BMI 31.80 kg/m  Wt Readings from Last 3 Encounters:  02/17/18 197 lb (89.4 kg)  12/02/17 194 lb 6.4 oz (88.2 kg)  05/27/17 188 lb 3.2 oz (85.4 kg)   BP Readings from Last 3 Encounters:  02/17/18 122/78  12/02/17 126/62  05/27/17 128/66   Health Maintenance Due  Topic Date Due  . Hepatitis C Screening  [birth year]  . FOOT EXAM  11/19/1958  . URINE MICROALBUMIN  11/19/1958  . OPHTHALMOLOGY EXAM  10/23/2016    There are no preventive care reminders to display for this patient.  Lab Results  Component Value Date   TSH 1.95 05/27/2017   Lab Results  Component Value Date   WBC 8.0 05/27/2017   HGB 14.4 05/27/2017   HCT 41.4 05/27/2017   MCV 85.0 05/27/2017   PLT 280.0 05/27/2017   Lab Results  Component Value Date   NA 139 12/02/2017   K 4.6 12/02/2017   CO2 29 12/02/2017   GLUCOSE 130 (H) 12/02/2017   BUN 13 12/02/2017   CREATININE 0.68 12/02/2017   BILITOT 0.6 05/27/2017   ALKPHOS 67 05/27/2017   AST 22 05/27/2017   ALT 31 05/27/2017   PROT 7.0 05/27/2017   ALBUMIN 4.2 05/27/2017   CALCIUM 9.7 12/02/2017   GFR 122.87 12/02/2017   Lab Results  Component Value Date   CHOL 147 12/02/2017   Lab Results  Component Value Date   HDL 47.20 12/02/2017   Lab Results  Component  Value Date   LDLCALC 80 12/02/2017   Lab Results  Component Value Date   TRIG 97.0 12/02/2017   Lab Results  Component Value Date   CHOLHDL 3 12/02/2017   Lab Results  Component Value Date   HGBA1C 6.8 (H) 12/02/2017      Assessment & Plan:     Problem List Items Addressed This Visit      Cardiovascular and Mediastinum   Hypertension - Primary   Relevant Orders   CBC   Comprehensive metabolic panel   Urinalysis, Routine w reflex microscopic   Microalbumin / creatinine urine ratio   Coronary artery disease involving native coronary artery of native heart without angina pectoris   Relevant Orders   CBC   Comprehensive metabolic panel   LDL cholesterol, direct   Lipid panel   Carotid artery disease (HCC)   Relevant Orders   Lipid panel     Endocrine   Diabetes mellitus type 2, noninsulin dependent ([employer])   Relevant Orders   CBC   Comprehensive metabolic panel   Hemoglobin A1c   Urinalysis, Routine w reflex microscopic   Microalbumin / creatinine urine ratio     Genitourinary   Benign prostatic hyperplasia with urinary hesitancy   Relevant Orders   PSA     Other   Hyperlipidemia   Relevant Orders   LDL cholesterol, direct   Lipid panel   H/O iron deficiency anemia   Relevant Orders   CBC   Iron, TIBC and Ferritin Panel   Pterygium eye, right      No orders of the defined types were placed in this encounter.   Follow-up: Return in about 6 months (around 08/19/2018).   Information was given to the patient on heart healthy eating and exercising to lose weight.  He will see ophthalmology 1 January for follow-up of his pterygium.  Cardiology on Wednesday.  Follow-up with me in 6 months.

## 2018-02-18 LAB — IRON,TIBC AND FERRITIN PANEL
%SAT: 24 % (ref 20–48)
Ferritin: 66 ng/mL (ref 24–380)
Iron: 96 ug/dL (ref 50–180)
TIBC: 397 mcg/dL (calc) (ref 250–425)

## 2018-02-19 ENCOUNTER — Ambulatory Visit (INDEPENDENT_AMBULATORY_CARE_PROVIDER_SITE_OTHER): Payer: Medicare Other | Admitting: Internal Medicine

## 2018-02-19 ENCOUNTER — Encounter: Payer: Self-pay | Admitting: Internal Medicine

## 2018-02-19 VITALS — BP 136/79 | HR 90 | Ht 66.0 in | Wt 193.6 lb

## 2018-02-19 DIAGNOSIS — E785 Hyperlipidemia, unspecified: Secondary | ICD-10-CM

## 2018-02-19 DIAGNOSIS — I10 Essential (primary) hypertension: Secondary | ICD-10-CM

## 2018-02-19 DIAGNOSIS — I6523 Occlusion and stenosis of bilateral carotid arteries: Secondary | ICD-10-CM

## 2018-02-19 DIAGNOSIS — I340 Nonrheumatic mitral (valve) insufficiency: Secondary | ICD-10-CM | POA: Diagnosis not present

## 2018-02-19 NOTE — Patient Instructions (Signed)
Medication Instructions:  Continue current medications If you need a refill on your cardiac medications before your next appointment, please call your pharmacy.   Testing/Procedures: Your physician has requested that you have an echocardiogram. Echocardiography is a painless test that uses sound waves to create images of your heart. It provides your doctor with information about the size and shape of your heart and how well your heart's chambers and valves are working. This procedure takes approximately one hour. There are no restrictions for this procedure. -- done at [address] - [address]  Follow-Up: At [facility], you and your health needs are our priority.  As part of our continuing mission to provide you with exceptional heart care, we have created designated Provider Care Teams.  These Care Teams include your primary Cardiologist (physician) and Advanced Practice Providers (APPs -  Physician Assistants and Nurse Practitioners) who all work together to provide you with the care you need, when you need it. You will need a follow up appointment in 12 months.  Please call our office 2 months in advance to schedule this appointment.  You may see Dr. Debara Pickett or one of the following Advanced Practice Providers on your designated Care Team: Almyra Deforest, [place] . Fabian Sharp, PA-C  Any Other Special Instructions Will Be Listed Below (If Applicable).

## 2018-02-19 NOTE — Progress Notes (Signed)
OFFICE NOTE  Chief Complaint:  Routine follow-up  Primary Care Physician: Libby Maw, MD  HPI:  Kurt Kidd is a 67 y.o. male with a past medial history significant for coronary artery disease status post Cypher DES in 2004 to the LAD. He underwent repeat cardiac catheterization in 2008 showing 10% in-stent restenosis and 40% proximal circumflex stenosis as well as 40% proximal RCA stenosis with normal LV function. He also has dyslipidemia and strong family history of premature coronary artery disease in both parents. He reports that his father actually had a carotid endarterectomy as well. Recently he was found to have a left carotid bruit which I also heard on exam today. In September 2017 he was found to have moderate left carotid artery stenosis and no significant stenosis in the right. He is a former smoker and has not had a screening abdominal ultrasound for aneurysm. He did have ultrasound of the abdomen in 2010 which showed no significant aneurysm at the time. Recent lab work was performed 2 weeks ago showing total cholesterol 166, HDL-C 50, LDL-C 99 and triglycerides 82. Non-HDL cholesterol of 115. Goal for aggressive therapy would be to lower the LDL-C less than 70 and non-HDL less than 100. I discussed that with him today. Overall he reports being asymptomatic. He denies any chest pain or worsening shortness of breath. 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  07/16/2016  Kurt Kidd returns today for follow-up of his studies. He underwent abdominal aortic aneurysm screening Doppler which was negative for aneurysm (based on family history of AAA). He does have a history of moderate left internal carotid artery stenosis and has pending carotid Doppler scheduled for 07/20/2016. He's had no other symptoms of coronary disease. Cholesterol was not at goal and I started him on ezetimibe. He is due for  repeat lipid profile in one month.  02/19/2018  Kurt Kidd is seen today in follow-up.  He underwent carotid Dopplers recently which shows moderate bilateral carotid artery stenosis.  We had added ezetimibe and recently his labs have improved a total cholesterol 135, triglycerides 126, HDL 46 and LDL 61.  This now puts him at goal.  He is really asymptomatic.  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  That being said he does have a harsh mitral murmur.  He was known to have longstanding mitral valve disease and last had an echo in 2015 which showed moderate mitral regurgitation.  PMHx:  Past Medical History:  Diagnosis Date  . CAD (coronary artery disease)    DES.. 2004 /  catheterization 2008, 10% narrowing stent site, pain not cardiac  . Colitis, ischemic ([place])   . Colon polyp   . Degenerative joint disease   . Diabetes mellitus 2012  . Diverticulosis   . Dyslipidemia   . Ejection fraction    65-70%, echo, March, 2012  . GERD (gastroesophageal reflux disease)   . Hyperlipidemia   . Hypertension   . Mitral regurgitation    Echo, mild, 2012  . Murmur   . Onychomycosis   . Overweight(278.02)   . Palpitations    Rapid heartbeat and shortness of breath after eating 2008 recurrent episode 2012    Past Surgical History:  Procedure Laterality Date  . ANGIOPLASTY / STENTING FEMORAL  09/09/2002  .  CARDIAC CATHETERIZATION    . COLONOSCOPY  2014   Dr Bary Castilla  . COLONOSCOPY  11/26/2007   Dr Patterson/POLYPOID COLONIC MUCOSA WITH HYPEREMIA AND  . COLONOSCOPY WITH PROPOFOL N/A 07/13/2016   Procedure: COLONOSCOPY WITH PROPOFOL;  Surgeon: Robert Bellow, MD;  Location: [facility] ENDOSCOPY;  Service: Endoscopy;  Laterality: N/A;  . ESOPHAGOGASTRODUODENOSCOPY (EGD) WITH PROPOFOL N/A 07/13/2016   Procedure: ESOPHAGOGASTRODUODENOSCOPY (EGD) WITH PROPOFOL;  Surgeon: Robert Bellow, MD;   Location: [facility] ENDOSCOPY;  Service: Endoscopy;  Laterality: N/A;  . UPPER GI ENDOSCOPY  11/26/2007   Dr Sharlett Iles    FAMHx:  Family History  Problem Relation Age of Onset  . Heart failure Mother   . Heart disease Mother   . Diabetes Father        father also had ASHD/ CABG  . Heart disease Father   . Colon cancer Brother 19  . AAA (abdominal aortic aneurysm) Brother 53    SOCHx:   reports that he quit smoking about 27 years ago. His smoking use included cigarettes. He has a 26.00 pack-year smoking history. He has never used smokeless tobacco. He reports current alcohol use. He reports that he does not use drugs.  ALLERGIES:  No Known Allergies  ROS: Pertinent items noted in HPI and remainder of comprehensive ROS otherwise negative.  HOME MEDS: Current Outpatient Medications on File Prior to Visit  Medication Sig Dispense Refill  . ALPRAZolam (XANAX) 0.5 MG tablet TAKE 1/2 TO 1 TABLET BY MOUTH THREE TIMES DAILY AS DIRECTED (Patient taking differently: 3 (three) times daily as needed. TAKE 1/2 TO 1 TABLET BY MOUTH THREE TIMES DAILY AS DIRECTED) 90 tablet 5  . aspirin 81 MG EC tablet Take 1 tablet (81 mg total) by mouth daily. 30 tablet 0  . atorvastatin (LIPITOR) 80 MG tablet Take 1 tablet (80 mg total) by mouth daily. 90 tablet 2  . Blood Glucose Monitoring Suppl (ONE TOUCH ULTRA SYSTEM KIT) W/DEVICE KIT 1 kit by Does not apply route once. 1 each 0  . esomeprazole (NEXIUM) 40 MG capsule Take 1 capsule (40 mg total) by mouth daily. Take 30 minutes before a meal. 90 capsule 3  . ezetimibe (ZETIA) 10 MG tablet Take 1 tablet (10 mg total) by mouth daily. 90 tablet 2  . metFORMIN (GLUCOPHAGE) 500 MG tablet Take 1 tablet (500 mg total) by mouth 3 (three) times daily. 270 tablet 2  . methocarbamol (ROBAXIN) 500 MG tablet Take 1 tablet (500 mg total) by mouth 3 (three) times daily as needed for muscle spasms. 90 tablet 0  . metoprolol tartrate (LOPRESSOR) 50 MG tablet Take 1 tablet (50 mg  total) by mouth 2 (two) times daily. 180 tablet 1  . nitroGLYCERIN (NITROSTAT) 0.4 MG SL tablet Place 1 tablet (0.4 mg total) under the tongue every 5 (five) minutes as needed. 25 tablet 1  . tamsulosin (FLOMAX) 0.4 MG CAPS capsule TAKE 1 CAPSULE BY MOUTH ONCE DAILY AFTER SUPPER 90 capsule 2   No current facility-administered medications on file prior to visit.     LABS/IMAGING: No results found for this or any previous visit (from the past 48 hour(s)). No results found.  WEIGHTS: Wt Readings from Last 3 Encounters:  02/19/18 193 lb 9.6 oz (87.8 kg)  02/17/18 197 lb (89.4 kg)  12/02/17 194 lb 6.4 oz (88.2 kg)    VITALS: BP 136/79   Pulse 90   Ht 5' 6"  (1.676 m)   Wt 193 lb 9.6 oz (87.8 kg)  BMI 31.25 kg/m   EXAM: General appearance: alert and no distress Neck: no carotid bruit, no JVD and thyroid not enlarged, symmetric, no tenderness/mass/nodules Lungs: clear to auscultation bilaterally Heart: regular rate and rhythm and systolic murmur: holosystolic 3/6, blowing at apex Abdomen: soft, non-tender; bowel sounds normal; no masses,  no organomegaly Extremities: extremities normal, atraumatic, no cyanosis or edema Pulses: 2+ and symmetric Skin: Skin color, texture, turgor normal. No rashes or lesions Neurologic: Grossly normal Psych: Pleasant  EKG: Sinus rhythm with PVCs at 90, moderate voltage criteria for LVH, inferior T wave abnormalities-personally reviewed  ASSESSMENT: 1. CAD status post Cypher DES to the LAD in 2004, 10% in-stent restenosis in 2008 - moderate proximal LCX and RCA disease at the time 2. Dyslipidemia 3. Moderate left internal carotid artery stenosis 4. Former smoker 5. Family history of AAA - no AAA by ultrasound 07/2016 6. DM2 7. Moderate mitral regurgitation  PLAN: 1.   Mr. Roggenkamp is doing well with cholesterol which is now at goal.  His carotid arteries have been stable.  We will reassess those in a year.  He does have moderate mitral  regurgitation by echo in 2015.  He has a harsh murmur today and although he has been asymptomatic this is not been reassessed in 5 years.  I would like him to have a repeat echo to assure stability of his mitral valve.  Will contact her with those results but otherwise plan to see him back annually or sooner as necessary.  Pixie Casino, MD, [facility], [place] Director of the [facility]  Cardiovascular [facility] Diplomate of the American Board of Clinical Lipidology Attending Cardiologist  Direct Dial: [phone]  Fax: [phone]  Website:  [email or web address] 02/19/2018, 3:17 PM

## 2018-03-03 ENCOUNTER — Other Ambulatory Visit: Payer: Self-pay

## 2018-03-03 ENCOUNTER — Ambulatory Visit (HOSPITAL_COMMUNITY): Payer: Medicare Other | Attending: Cardiology

## 2018-03-03 DIAGNOSIS — I340 Nonrheumatic mitral (valve) insufficiency: Secondary | ICD-10-CM | POA: Diagnosis not present

## 2018-03-28 ENCOUNTER — Encounter: Payer: Self-pay | Admitting: Family Medicine

## 2018-03-28 ENCOUNTER — Ambulatory Visit (INDEPENDENT_AMBULATORY_CARE_PROVIDER_SITE_OTHER): Payer: Medicare Other | Admitting: Family Medicine

## 2018-03-28 VITALS — BP 128/70 | HR 75 | Temp 98.4°F | Ht 66.0 in | Wt 196.4 lb

## 2018-03-28 DIAGNOSIS — J209 Acute bronchitis, unspecified: Secondary | ICD-10-CM

## 2018-03-28 DIAGNOSIS — J4541 Moderate persistent asthma with (acute) exacerbation: Secondary | ICD-10-CM

## 2018-03-28 DIAGNOSIS — J45909 Unspecified asthma, uncomplicated: Secondary | ICD-10-CM | POA: Insufficient documentation

## 2018-03-28 MED ORDER — PREDNISONE 20 MG PO TABS: 20.0000 mg | ORAL_TABLET | Freq: Two times a day (BID) | ORAL | 0 refills | Status: AC

## 2018-03-28 MED ORDER — AZITHROMYCIN 250 MG PO TABS: ORAL_TABLET | ORAL | 0 refills | Status: DC

## 2018-03-28 MED ORDER — METHYLPREDNISOLONE SODIUM SUCC 125 MG IJ SOLR
125.0000 mg | Freq: Once | INTRAMUSCULAR | Status: AC
  Administered 2018-03-28: 125 mg via INTRAMUSCULAR

## 2018-03-28 MED ORDER — HYDROCODONE-HOMATROPINE 5-1.5 MG/5ML PO SYRP: 5.0000 mL | ORAL_SOLUTION | Freq: Every evening | ORAL | 0 refills | Status: DC | PRN

## 2018-03-28 NOTE — Progress Notes (Signed)
Established Patient Office Visit  Subjective:  Patient ID: Kurt Kidd, male    DOB: 06/15/1950  Age: 68 y.o. MRN: [id number]  CC:  Chief Complaint  Patient presents with  . Cough  . Fever    HPI [name] presents for evaluation and treatment of a 3 to 4-day history of cough associated with reactive airway disease productive of purulent phlegm.  He has no history of asthma.  Discontinued smoking many years ago.  He has wheezed before with an illness in the past.  Was given a steroid injection that helped a great deal.  He has never required inhaler use.  Phlegm is purulent.  Denies facial pressure teeth pain postnasal drip or rhinorrhea.  Significant history of coronary artery disease and carotid artery disease.  Past Medical History:  Diagnosis Date  . CAD (coronary artery disease)    DES.. 2004 /  catheterization 2008, 10% narrowing stent site, pain not cardiac  . Colitis, ischemic ([place])   . Colon polyp   . Degenerative joint disease   . Diabetes mellitus 2012  . Diverticulosis   . Dyslipidemia   . Ejection fraction    65-70%, echo, March, 2012  . GERD (gastroesophageal reflux disease)   . Hyperlipidemia   . Hypertension   . Mitral regurgitation    Echo, mild, 2012  . Murmur   . Onychomycosis   . Overweight(278.02)   . Palpitations    Rapid heartbeat and shortness of breath after eating 2008 recurrent episode 2012    Past Surgical History:  Procedure Laterality Date  . ANGIOPLASTY / STENTING FEMORAL  09/09/2002  . CARDIAC CATHETERIZATION    . COLONOSCOPY  2014   Dr Bary Castilla  . COLONOSCOPY  11/26/2007   Dr Patterson/POLYPOID COLONIC MUCOSA WITH HYPEREMIA AND  . COLONOSCOPY WITH PROPOFOL N/A 07/13/2016   Procedure: COLONOSCOPY WITH PROPOFOL;  Surgeon: Robert Bellow, MD;  Location: [facility] END[facility]COPY;  Service: Endoscopy;  Laterality: N/A;  . ESOPHAGOGASTRODUODENOSCOPY (EGD) WITH PROPOFOL N/A 07/13/2016   Procedure: ESOPHAGOGASTRODUODENOSCOPY (EGD) WITH  PROPOFOL;  Surgeon: Robert Bellow, MD;  Location: [facility] END[facility];  Service: Endoscopy;  Laterality: N/A;  . UPPER GI ENDOSCOPY  11/26/2007   Dr Sharlett Iles    Family History  Problem Relation Age of Onset  . Heart failure Mother   . Heart disease Mother   . Diabetes Father        father also had ASHD/ CABG  . Heart disease Father   . Colon cancer Brother 1  . AAA (abdominal aortic aneurysm) Brother 55    Social History   Socioeconomic History  . Marital status: Married    Spouse name: Not on file  . Number of children: Not on file  . Years of education: Not on file  . Highest education level: Not on file  Occupational History  . 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 Financial resource strain: Not on file  . Food insecurity:    Worry: Not on file    Inability: Not on file  . Transportation needs:    Medical: Not on file    Non-medical: Not on file  Tobacco Use  . Smoking status: Former Smoker    Packs/day: 1.00    Years: 26.00    Pack years: 26.00    Types: Cigarettes    Last attempt to quit: 03/05/1990    Years since quitting: 28.0  . Smokeless tobacco: Never Used  Substance and Sexual Activity  . Alcohol use: Yes    Alcohol/week: 0.0 standard drinks    Comment: 2 drinks per day  . Drug use: No  . Sexual activity: Not on file  Lifestyle  . Physical activity:    Days per week: Not on file    Minutes per session: Not on file  . Stress: Not on file  Relationships  . Social connections:    Talks on phone: Not on file    Gets together: Not on file    Attends religious service: Not on file    Active member of club or organization: Not on file    Attends meetings of clubs or organizations: Not on file    Relationship status: Not on file  . Intimate partner violence:    Fear of current or ex partner: Not on file    Emotionally abused: Not on file    Physically abused: Not on file    Forced sexual activity: Not on  file  Other Topics Concern  . Not on file  Social History Narrative  . Not on file    Outpatient Medications Prior to Visit  Medication Sig Dispense Refill  . ALPRAZolam (XANAX) 0.5 MG tablet TAKE 1/2 TO 1 TABLET BY MOUTH THREE TIMES DAILY AS DIRECTED (Patient taking differently: 3 (three) times daily as needed. TAKE 1/2 TO 1 TABLET BY MOUTH THREE TIMES DAILY AS DIRECTED) 90 tablet 5  . aspirin 81 MG EC tablet Take 1 tablet (81 mg total) by mouth daily. 30 tablet 0  . atorvastatin (LIPITOR) 80 MG tablet Take 1 tablet (80 mg total) by mouth daily. 90 tablet 2  . Blood Glucose Monitoring Suppl (ONE TOUCH ULTRA SYSTEM KIT) W/DEVICE KIT 1 kit by Does not apply route once. 1 each 0  . esomeprazole (NEXIUM) 40 MG capsule Take 1 capsule (40 mg total) by mouth daily. Take 30 minutes before a meal. 90 capsule 3  . ezetimibe (ZETIA) 10 MG tablet Take 1 tablet (10 mg total) by mouth daily. 90 tablet 2  . metFORMIN (GLUCOPHAGE) 500 MG tablet Take 1 tablet (500 mg total) by mouth 3 (three) times daily. 270 tablet 2  . methocarbamol (ROBAXIN) 500 MG tablet Take 1 tablet (500 mg total) by mouth 3 (three) times daily as needed for muscle spasms. 90 tablet 0  . metoprolol tartrate (LOPRESSOR) 50 MG tablet Take 1 tablet (50 mg total) by mouth 2 (two) times daily. 180 tablet 1  . nitroGLYCERIN (NITROSTAT) 0.4 MG SL tablet Place 1 tablet (0.4 mg total) under the tongue every 5 (five) minutes as needed. 25 tablet 1  . tamsulosin (FLOMAX) 0.4 MG CAPS capsule TAKE 1 CAPSULE BY MOUTH ONCE DAILY AFTER SUPPER 90 capsule 2   No facility-administered medications prior to visit.     No Known Allergies  ROS Review of Systems  Constitutional: Negative for chills, diaphoresis, fatigue, fever and unexpected weight change.  HENT: Negative for postnasal drip, rhinorrhea, sinus pressure, sinus pain and sore throat.   Eyes: Negative for photophobia and visual disturbance.  Respiratory: Positive for cough and wheezing.  Negative for shortness of breath.   Cardiovascular: Negative.   Gastrointestinal: Negative.   Endocrine: Negative for polyphagia and polyuria.  Genitourinary: Negative.   Musculoskeletal: Negative for arthralgias.  Allergic/Immunologic: Negative for immunocompromised state.  Neurological: Negative for weakness and headaches.  Hematological: Does not bruise/bleed easily.  Psychiatric/Behavioral: Negative.       Objective:    Physical Exam  Constitutional:  He is oriented to person, place, and time. He appears well-developed and well-nourished. No distress.  HENT:  Head: Normocephalic and atraumatic.  Right Ear: External ear normal.  Mouth/Throat: Oropharynx is clear and moist. No oropharyngeal exudate.  Eyes: Pupils are equal, round, and reactive to light. Conjunctivae are normal. Right eye exhibits no discharge. Left eye exhibits no discharge. No scleral icterus.  Neck: Neck supple. No JVD present. No tracheal deviation present. No thyromegaly present.  Cardiovascular: Normal rate, regular rhythm and normal heart sounds.  Pulmonary/Chest: Effort normal. No stridor. He has decreased breath sounds. He has wheezes. He has no rhonchi. He has no rales.  Abdominal: Bowel sounds are normal.  Lymphadenopathy:    He has no cervical adenopathy.  Neurological: He is alert and oriented to person, place, and time.  Skin: Skin is warm and dry. He is not diaphoretic.  Psychiatric: He has a normal mood and affect. His behavior is normal.    BP 128/70   Pulse 75   Temp 98.4 F (36.9 C) (Oral)   Ht _0  (1.676 m)   Wt 196 lb 6 oz (89.1 kg)   SpO2 95%   BMI 31.70 kg/m  Wt Readings from Last 3 Encounters:  03/28/18 196 lb 6 oz (89.1 kg)  02/19/18 193 lb 9.6 oz (87.8 kg)  02/17/18 197 lb (89.4 kg)   BP Readings from Last 3 Encounters:  03/28/18 128/70  02/19/18 136/79  02/17/18 122/78   Guideline developer:  UpToDate (see UpToDate for funding source) Date Released: June 2014  Health  Maintenance Due  Topic Date Due  . Hepatitis C Screening  1950-09-16  . FOOT EXAM  11/19/1958  . OPHTHALMOLOGY EXAM  10/23/2016    There are no preventive care reminders to display for this patient.  Lab Results  Component Value Date   TSH 1.95 05/27/2017   Lab Results  Component Value Date   WBC 7.7 02/17/2018   HGB 14.6 02/17/2018   HCT 41.9 02/17/2018   MCV 85.4 02/17/2018   PLT 285.0 02/17/2018   Lab Results  Component Value Date   NA 138 02/17/2018   K 4.6 02/17/2018   CO2 28 02/17/2018   GLUCOSE 146 (H) 02/17/2018   BUN 11 02/17/2018   CREATININE 0.66 02/17/2018   BILITOT 0.6 02/17/2018   ALKPHOS 72 02/17/2018   AST 21 02/17/2018   ALT 34 02/17/2018   PROT 6.8 02/17/2018   ALBUMIN 4.6 02/17/2018   CALCIUM 9.8 02/17/2018   GFR 127.10 02/17/2018   Lab Results  Component Value Date   CHOL 133 02/17/2018   Lab Results  Component Value Date   HDL 46.70 02/17/2018   Lab Results  Component Value Date   LDLCALC 61 02/17/2018   Lab Results  Component Value Date   TRIG 126.0 02/17/2018   Lab Results  Component Value Date   CHOLHDL 3 02/17/2018   Lab Results  Component Value Date   HGBA1C 6.6 (H) 02/17/2018      Assessment & Plan:   Problem List Items Addressed This Visit      Respiratory   Asthmatic bronchitis - Primary   Relevant Medications   methylPREDNISolone sodium succinate (SOLU-MEDROL) 125 mg/2 mL injection 125 mg (Completed)   predniSONE (DELTASONE) 20 MG tablet   azithromycin (ZITHROMAX) 250 MG tablet   HYDROcodone-homatropine (HYCODAN) 5-1.5 MG/5ML syrup      Meds ordered this encounter  Medications  . methylPREDNISolone sodium succinate (SOLU-MEDROL) 125 mg/2 mL injection 125 mg  .  predniSONE (DELTASONE) 20 MG tablet    Sig: Take 1 tablet (20 mg total) by mouth 2 (two) times daily with a meal for 7 days. Start tomorrow.    Dispense:  14 tablet    Refill:  0  . azithromycin (ZITHROMAX) 250 MG tablet    Sig: Take 2 today and  then 1 each day until finished.    Dispense:  6 tablet    Refill:  0  . HYDROcodone-homatropine (HYCODAN) 5-1.5 MG/5ML syrup    Sig: Take 5 mLs by mouth at bedtime as needed for cough.    Dispense:  120 mL    Refill:  0    Follow-up: Return in about 3 days (around 03/31/2018), or if symptoms worsen or fail to improve.

## 2018-03-28 NOTE — Patient Instructions (Signed)

## 2018-05-17 IMAGING — DX DG HAND COMPLETE 3+V*R*
3 series · 3 of 3 positions shown · non-contrast
Comparison: None.

GO CLINICAL DATA:
Increasing pain in the left thumb

EXAM:
RIGHT HAND - COMPLETE 3+ VIEW

[hand ap]
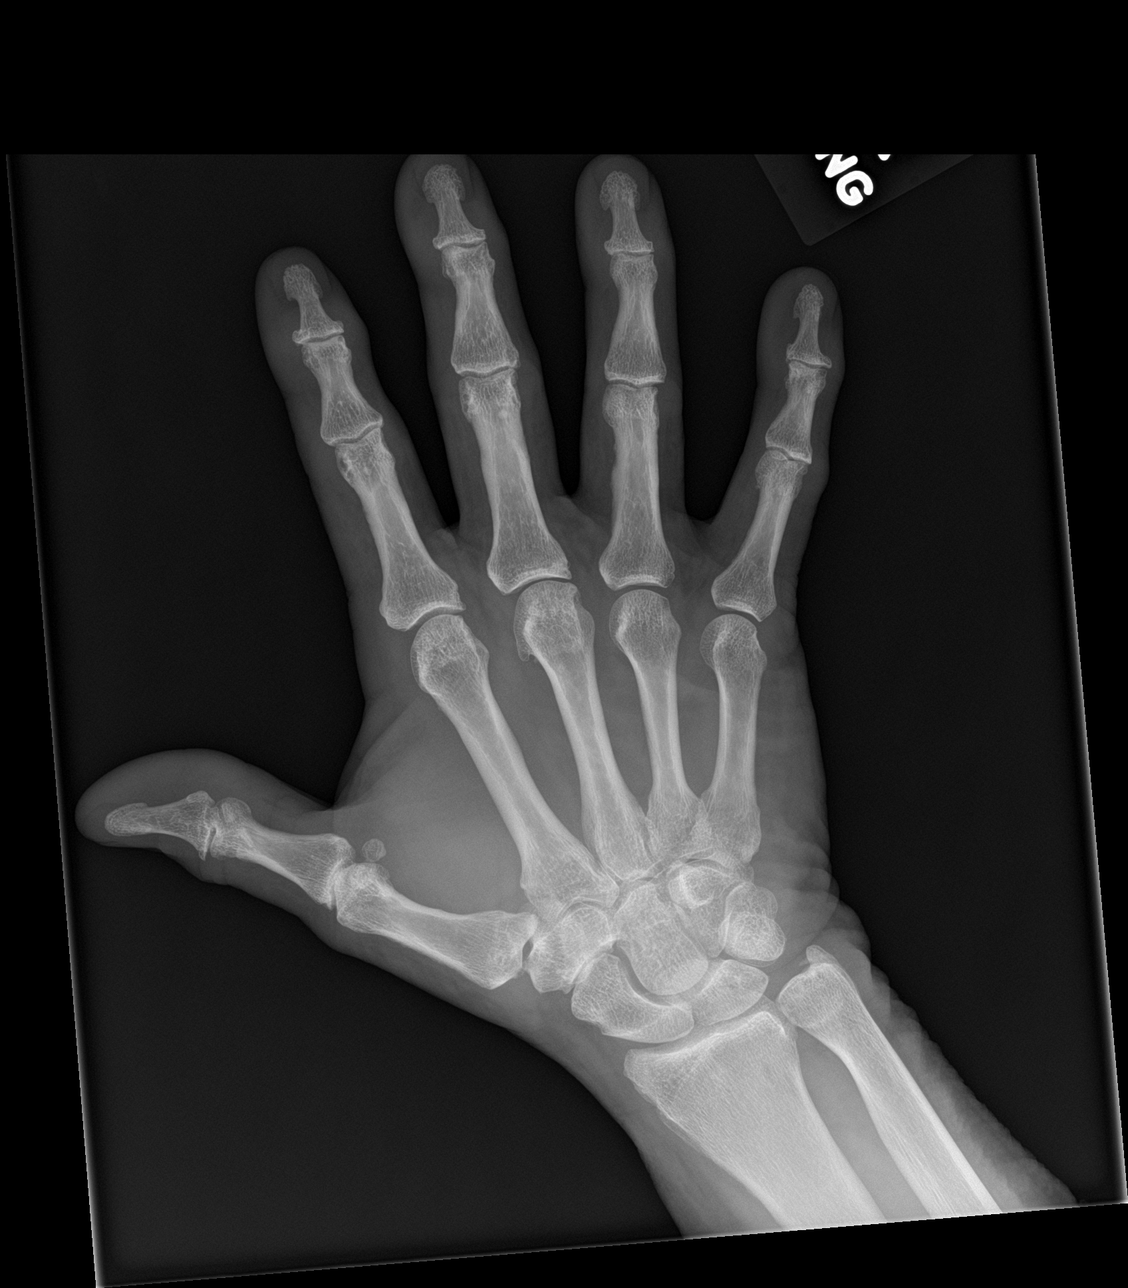

[hand obl]
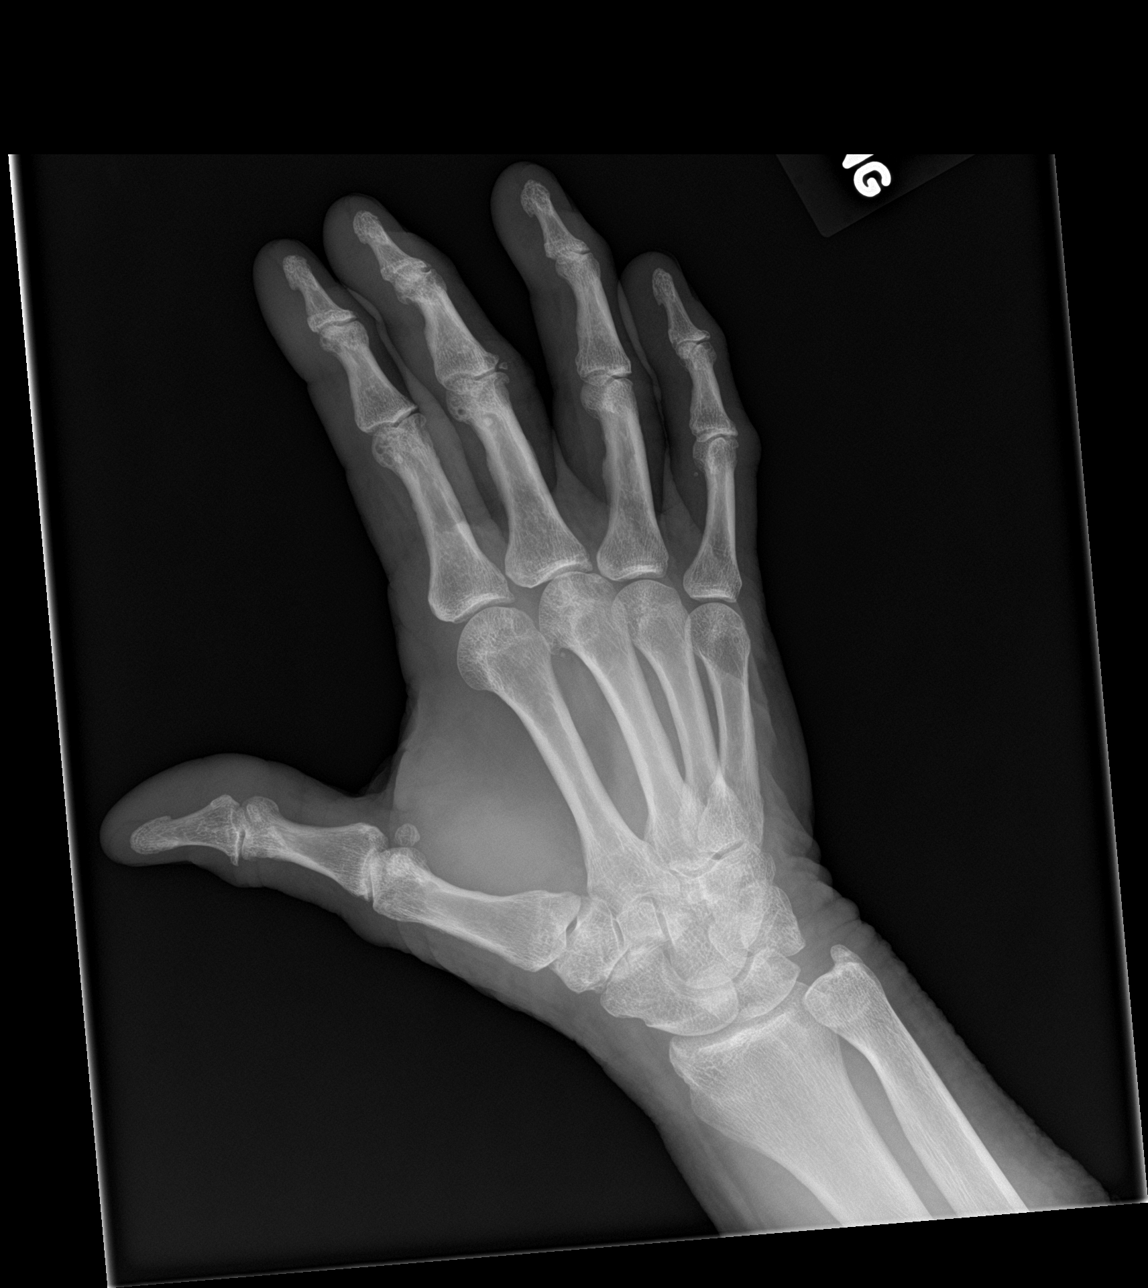

[hand lat]
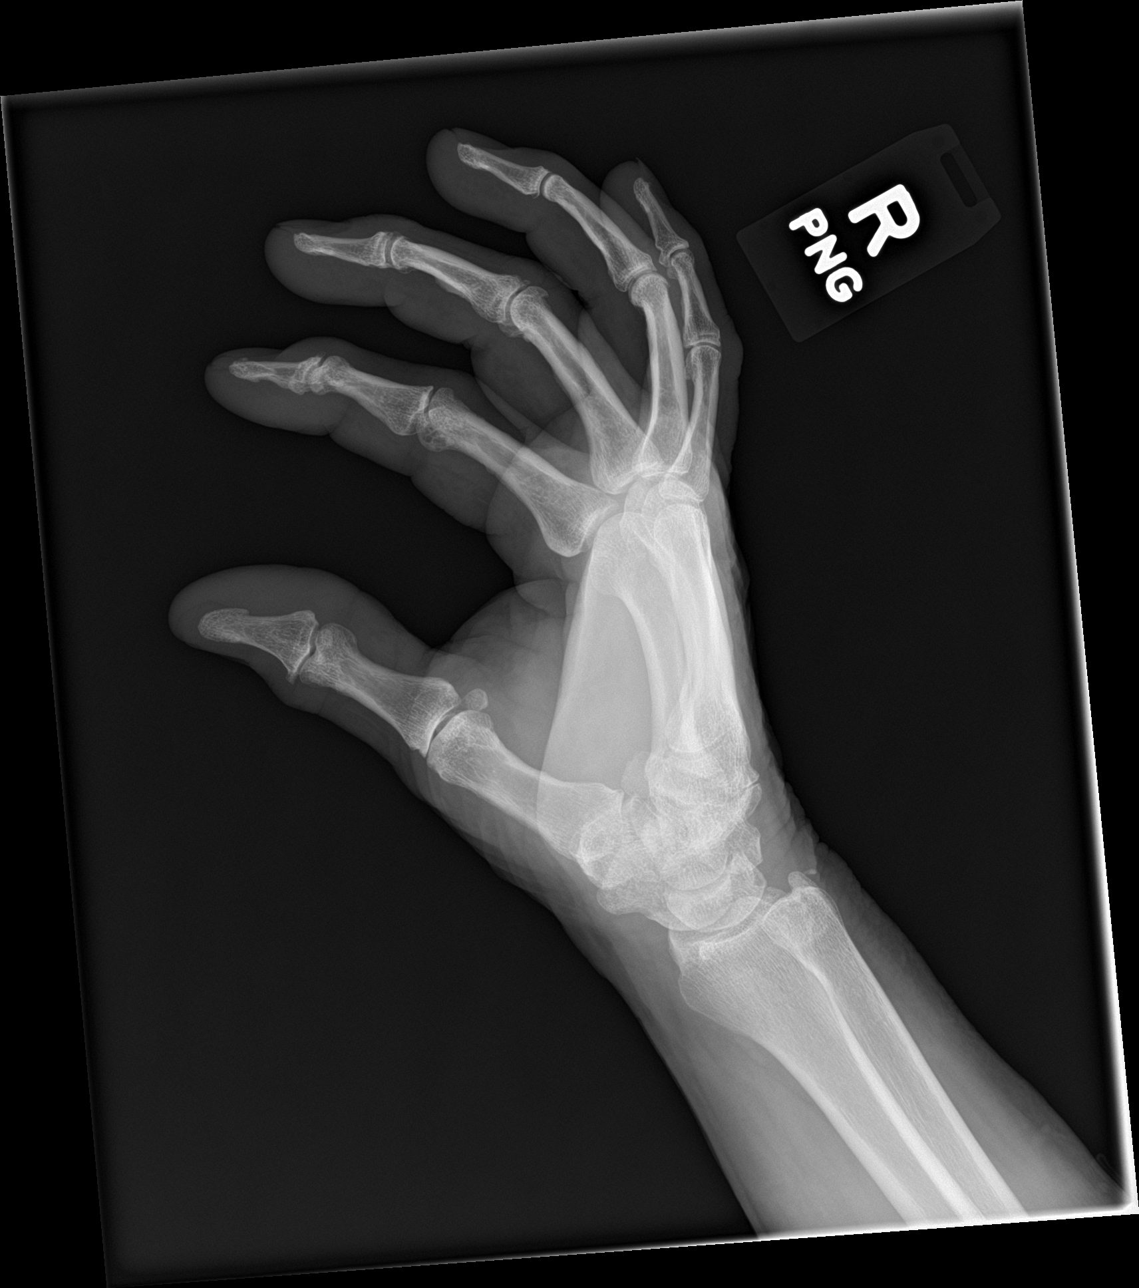

[3 of 3 positions shown; findings below may reference images not displayed]

FINDINGS: There is joint space narrowing of the left thumb articulations with
spurring at the interphalangeal joint. Mild joint space narrowing of
the second and fifth DIP joints with minimal spurring at the fifth
DIP joint along the ulnar aspect. Tiny subcortical cysts and/or
marginal erosions are noted at the second and fifth DIP joints.
Overall bony mineralization is normal. Small osteophyte off the head
of the third metacarpal. There is joint space narrowing of the third
MCP. Carpal bones are maintained apart from joint space narrowing of
the triscaphe. Tiny ossification noted adjacent to the ulnar styloid
possibly related to old trauma or normal variant ossicle.
IMPRESSION: Osteoarthritis of the thumb. Similar joint space narrowing and
spurring of the fifth DIP joint with joint space narrowing of the
second DIP, third MCP and triscaphe joint of the wrist. Findings
consistent with osteoarthritis. No acute osseous abnormality.

## 2018-08-25 ENCOUNTER — Ambulatory Visit: Payer: Medicare Other | Admitting: Family Medicine

## 2018-08-25 ENCOUNTER — Telehealth: Payer: Self-pay

## 2018-08-25 NOTE — Telephone Encounter (Signed)

## 2018-08-26 ENCOUNTER — Ambulatory Visit (INDEPENDENT_AMBULATORY_CARE_PROVIDER_SITE_OTHER): Payer: Medicare Other | Admitting: Family Medicine

## 2018-08-26 ENCOUNTER — Encounter: Payer: Self-pay | Admitting: Family Medicine

## 2018-08-26 VITALS — BP 128/76 | HR 92 | Ht 66.0 in | Wt 196.1 lb

## 2018-08-26 DIAGNOSIS — I10 Essential (primary) hypertension: Secondary | ICD-10-CM | POA: Diagnosis not present

## 2018-08-26 DIAGNOSIS — E119 Type 2 diabetes mellitus without complications: Secondary | ICD-10-CM | POA: Diagnosis not present

## 2018-08-26 DIAGNOSIS — E782 Mixed hyperlipidemia: Secondary | ICD-10-CM | POA: Diagnosis not present

## 2018-08-26 DIAGNOSIS — I6523 Occlusion and stenosis of bilateral carotid arteries: Secondary | ICD-10-CM | POA: Diagnosis not present

## 2018-08-26 LAB — URINALYSIS, ROUTINE W REFLEX MICROSCOPIC
Bilirubin Urine: NEGATIVE
Hgb urine dipstick: NEGATIVE
Ketones, ur: NEGATIVE
Leukocytes,Ua: NEGATIVE
Nitrite: NEGATIVE
RBC / HPF: NONE SEEN (ref 0–?)
Specific Gravity, Urine: 1.015 (ref 1.000–1.030)
Total Protein, Urine: NEGATIVE
Urine Glucose: NEGATIVE
Urobilinogen, UA: 0.2 (ref 0.0–1.0)
WBC, UA: NONE SEEN (ref 0–?)
pH: 6 (ref 5.0–8.0)

## 2018-08-26 LAB — LIPID PANEL
Cholesterol: 132 mg/dL (ref 0–200)
HDL: 49.8 mg/dL (ref >39.00)
LDL Cholesterol: 60 mg/dL (ref 0–99)
NonHDL: 82.3
Total CHOL/HDL Ratio: 3
Triglycerides: 112 mg/dL (ref 0.0–149.0)
VLDL: 22.4 mg/dL (ref 0.0–40.0)

## 2018-08-26 LAB — LDL CHOLESTEROL, DIRECT: Direct LDL: 70 mg/dL

## 2018-08-26 LAB — CBC
HCT: 41.8 % (ref 39.0–52.0)
Hemoglobin: 14.2 g/dL (ref 13.0–17.0)
MCHC: 34 g/dL (ref 30.0–36.0)
MCV: 86.8 fl (ref 78.0–100.0)
Platelets: 260 10*3/uL (ref 150.0–400.0)
RBC: 4.82 Mil/uL (ref 4.22–5.81)
RDW: 14.3 % (ref 11.5–15.5)
WBC: 7.3 10*3/uL (ref 4.0–10.5)

## 2018-08-26 LAB — HEMOGLOBIN A1C: Hgb A1c MFr Bld: 7 % — ABNORMAL HIGH (ref 4.6–6.5)

## 2018-08-26 LAB — MICROALBUMIN / CREATININE URINE RATIO
Creatinine,U: 92.3 mg/dL
Microalb Creat Ratio: 6.4 mg/g (ref 0.0–30.0)
Microalb, Ur: 5.9 mg/dL — ABNORMAL HIGH (ref 0.0–1.9)

## 2018-08-26 NOTE — Patient Instructions (Signed)

## 2018-08-26 NOTE — Progress Notes (Addendum)
Established Patient Office Visit  Subjective:  Patient ID: Kurt Kidd, male    DOB: [birth year]  Age: 68 y.o. MRN: [id number]  CC:  Chief Complaint  Patient presents with  . Follow-up    HPI Kurt Kidd presents for follow-up of his hypertension, diabetes hyperlipidemia and carotid artery stenosis.  History of coronary artery disease.  He is also followed by cardiology.  Compliant with all of his medicines and blood pressure and cholesterol diabetes is been well controlled.  No issues with his medications.  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  Taking Xanax sparingly.  Unable to see the dentist or the ophthalmologist this year secondary to COVID issues.  Past Medical History:  Diagnosis Date  . CAD (coronary artery disease)    DES.. 2004 /  catheterization 2008, 10% narrowing stent site, pain not cardiac  . Colitis, ischemic ([place])   . Colon polyp   . Degenerative joint disease   . Diabetes mellitus 2012  . Diverticulosis   . Dyslipidemia   . Ejection fraction    65-70%, echo, March, 2012  . GERD (gastroesophageal reflux disease)   . Hyperlipidemia   . Hypertension   . Mitral regurgitation    Echo, mild, 2012  . Murmur   . Onychomycosis   . Overweight(278.02)   . Palpitations    Rapid heartbeat and shortness of breath after eating 2008 recurrent episode 2012    Past Surgical History:  Procedure Laterality Date  . ANGIOPLASTY / STENTING FEMORAL  09/09/2002  . CARDIAC CATHETERIZATION    . COLONOSCOPY  2014   Dr Bary Castilla  . COLONOSCOPY  11/26/2007   Dr Patterson/POLYPOID COLONIC MUCOSA WITH HYPEREMIA AND  . COLONOSCOPY WITH PROPOFOL N/A 07/13/2016   Procedure: COLONOSCOPY WITH PROPOFOL;  Surgeon: Robert Bellow, MD;  Location: [facility] END[facility]COPY;  Service: Endoscopy;  Laterality: N/A;  . ESOPHAGOGASTRODUODENOSCOPY (EGD) WITH PROPOFOL N/A 07/13/2016   Procedure: ESOPHAGOGASTRODUODENOSCOPY (EGD) WITH PROPOFOL;  Surgeon: Robert Bellow, MD;  Location: [facility] ENDOSCOPY;  Service: Endoscopy;  Laterality: N/A;  . UPPER GI ENDOSCOPY  11/26/2007   Dr Sharlett Iles    Family History  Problem Relation Age of Onset  . Heart failure Mother   . Heart disease Mother   . Diabetes Father        father also had ASHD/ CABG  . Heart disease Father   . Colon cancer Brother 84  . AAA (abdominal aortic aneurysm) Brother 66    Social History   Socioeconomic History  . Marital status: Married    Spouse name: Not on file  . Number of children: Not on file  . Years of education: Not on file  . Highest education level: Not on file  Occupational History  . 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 Financial resource strain: Not on file  . Food insecurity    Worry: Not on file    Inability: Not on file  . Transportation needs    Medical: Not on file    Non-medical: Not on file  Tobacco Use  . Smoking status: Former Smoker    Packs/day: 1.00    Years: 26.00    Pack years: 26.00    Types: Cigarettes    Quit date: 03/05/1990    Years since quitting: 28.7  . Smokeless tobacco: Never Used  Substance and Sexual Activity  . Alcohol use: Yes    Alcohol/week: 0.0  standard drinks    Comment: 2 drinks per day  . Drug use: No  . Sexual activity: Not on file  Lifestyle  . Physical activity    Days per week: Not on file    Minutes per session: Not on file  . Stress: Not on file  Relationships  . Social Herbalist on phone: Not on file    Gets together: Not on file    Attends religious service: Not on file    Active member of club or organization: Not on file    Attends meetings of clubs or organizations: Not on file    Relationship status: Not on file  . Intimate partner violence    Fear of current or ex partner: Not on file    Emotionally abused: Not on file    Physically abused: Not on file    Forced sexual activity: Not on file  Other Topics Concern  . Not on file   Social History Narrative  . Not on file    Outpatient Medications Prior to Visit  Medication Sig Dispense Refill  . ALPRAZolam (XANAX) 0.5 MG tablet TAKE 1/2 TO 1 TABLET BY MOUTH THREE TIMES DAILY AS DIRECTED (Patient taking differently: 3 (three) times daily as needed. TAKE 1/2 TO 1 TABLET BY MOUTH THREE TIMES DAILY AS DIRECTED) 90 tablet 5  . aspirin 81 MG EC tablet Take 1 tablet (81 mg total) by mouth daily. 30 tablet 0  . Blood Glucose Monitoring Suppl (ONE TOUCH ULTRA SYSTEM KIT) W/DEVICE KIT 1 kit by Does not apply route once. 1 each 0  . esomeprazole (NEXIUM) 40 MG capsule Take 1 capsule (40 mg total) by mouth daily. Take 30 minutes before a meal. 90 capsule 3  . metFORMIN (GLUCOPHAGE) 500 MG tablet Take 1 tablet (500 mg total) by mouth 3 (three) times daily. 270 tablet 2  . methocarbamol (ROBAXIN) 500 MG tablet Take 1 tablet (500 mg total) by mouth 3 (three) times daily as needed for muscle spasms. 90 tablet 0  . nitroGLYCERIN (NITROSTAT) 0.4 MG SL tablet Place 1 tablet (0.4 mg total) under the tongue every 5 (five) minutes as needed. 25 tablet 1  . atorvastatin (LIPITOR) 80 MG tablet Take 1 tablet (80 mg total) by mouth daily. 90 tablet 2  . azithromycin (ZITHROMAX) 250 MG tablet Take 2 today and then 1 each day until finished. 6 tablet 0  . ezetimibe (ZETIA) 10 MG tablet Take 1 tablet (10 mg total) by mouth daily. 90 tablet 2  . HYDROcodone-homatropine (HYCODAN) 5-1.5 MG/5ML syrup Take 5 mLs by mouth at bedtime as needed for cough. 120 mL 0  . metoprolol tartrate (LOPRESSOR) 50 MG tablet Take 1 tablet (50 mg total) by mouth 2 (two) times daily. 180 tablet 1  . tamsulosin (FLOMAX) 0.4 MG CAPS capsule TAKE 1 CAPSULE BY MOUTH ONCE DAILY AFTER SUPPER 90 capsule 2   No facility-administered medications prior to visit.     No Known Allergies  ROS Review of Systems  Constitutional: Negative.   Respiratory: Negative.   Cardiovascular: Negative.   Endocrine: Negative for polyphagia  and polyuria.  Genitourinary: Negative.   Musculoskeletal: Negative for gait problem and joint swelling.  Skin: Negative for pallor and rash.  Allergic/Immunologic: Negative for immunocompromised state.  Neurological: Negative for numbness and headaches.  Hematological: Does not bruise/bleed easily.  Psychiatric/Behavioral: Negative.       Objective:    Physical Exam  Constitutional: He is oriented to person, place,  and time. He appears well-developed and well-nourished. No distress.  HENT:  Head: Normocephalic and atraumatic.  Right Ear: External ear normal.  Left Ear: External ear normal.  Mouth/Throat: No oropharyngeal exudate.  Eyes: Conjunctivae are normal. Right eye exhibits no discharge. Left eye exhibits no discharge. No scleral icterus.  Neck: No JVD present. No tracheal deviation present. No thyromegaly present.  Cardiovascular: Normal rate and regular rhythm.  Murmur heard. Pulses:      Carotid pulses are on the left side with bruit. Pulmonary/Chest: Effort normal and breath sounds normal. No stridor.  Abdominal: Bowel sounds are normal.  Lymphadenopathy:    He has no cervical adenopathy.  Neurological: He is alert and oriented to person, place, and time.  Skin: Skin is warm and dry. He is not diaphoretic.     Psychiatric: He has a normal mood and affect. His behavior is normal.    BP 128/76   Pulse 92   Ht _0  (1.676 m)   Wt 196 lb 2 oz (89 kg)   SpO2 97%   BMI 31.66 kg/m  Wt Readings from Last 3 Encounters:  08/26/18 196 lb 2 oz (89 kg)  03/28/18 196 lb 6 oz (89.1 kg)  02/19/18 193 lb 9.6 oz (87.8 kg)   BP Readings from Last 3 Encounters:  08/26/18 128/76  03/28/18 128/70  02/19/18 136/79   Guideline developer:  UpToDate (see UpToDate for funding source) Date Released: June 2014  Health Maintenance Due  Topic Date Due  . Hepatitis C Screening  10/19/1950  . FOOT EXAM  11/19/1958  . OPHTHALMOLOGY EXAM  10/23/2016  . PNA vac Low Risk Adult (2  of 2 - PPSV23) 05/28/2018  . INFLUENZA VACCINE  10/04/2018    There are no preventive care reminders to display for this patient.  Lab Results  Component Value Date   TSH 1.95 05/27/2017   Lab Results  Component Value Date   WBC 7.3 08/26/2018   HGB 14.2 08/26/2018   HCT 41.8 08/26/2018   MCV 86.8 08/26/2018   PLT 260.0 08/26/2018   Lab Results  Component Value Date   NA 138 02/17/2018   K 4.6 02/17/2018   CO2 28 02/17/2018   GLUCOSE 146 (H) 02/17/2018   BUN 11 02/17/2018   CREATININE 0.66 02/17/2018   BILITOT 0.6 02/17/2018   ALKPHOS 72 02/17/2018   AST 21 02/17/2018   ALT 34 02/17/2018   PROT 6.8 02/17/2018   ALBUMIN 4.6 02/17/2018   CALCIUM 9.8 02/17/2018   GFR 127.10 02/17/2018   Lab Results  Component Value Date   CHOL 132 08/26/2018   Lab Results  Component Value Date   HDL 49.80 08/26/2018   Lab Results  Component Value Date   LDLCALC 60 08/26/2018   Lab Results  Component Value Date   TRIG 112.0 08/26/2018   Lab Results  Component Value Date   CHOLHDL 3 08/26/2018   Lab Results  Component Value Date   HGBA1C 7.0 (H) 08/26/2018      Assessment & Plan:   Problem List Items Addressed This Visit      Cardiovascular and Mediastinum   Hypertension - Primary   Relevant Medications   ezetimibe (ZETIA) 10 MG tablet   metoprolol tartrate (LOPRESSOR) 50 MG tablet   Other Relevant Orders   CBC (Completed)   Microalbumin / creatinine urine ratio (Completed)   Urinalysis, Routine w reflex microscopic (Completed)   Carotid artery disease (HCC)   Relevant Medications   ezetimibe (ZETIA)  10 MG tablet   metoprolol tartrate (LOPRESSOR) 50 MG tablet   Other Relevant Orders   LDL cholesterol, direct (Completed)   Lipid panel (Completed)     Endocrine   Diabetes mellitus type 2, noninsulin dependent (HCC)   Relevant Orders   Hemoglobin A1c (Completed)   Microalbumin / creatinine urine ratio (Completed)   Urinalysis, Routine w reflex microscopic  (Completed)     Other   Hyperlipidemia   Relevant Medications   ezetimibe (ZETIA) 10 MG tablet   metoprolol tartrate (LOPRESSOR) 50 MG tablet   Other Relevant Orders   LDL cholesterol, direct (Completed)   Lipid panel (Completed)      Meds ordered this encounter  Medications  . ezetimibe (ZETIA) 10 MG tablet    Sig: Take 1 tablet (10 mg total) by mouth daily.    Dispense:  90 tablet    Refill:  2    Please consider 90 day supplies to promote better adherence  . metoprolol tartrate (LOPRESSOR) 50 MG tablet    Sig: Take 1 tablet (50 mg total) by mouth 2 (two) times daily.    Dispense:  180 tablet    Refill:  1    Follow-up: Return in about 6 months (around 02/25/2019), or if symptoms worsen or fail to improve.   Continue medicines as above.  Recommended weight loss and increased exercise.

## 2018-09-27 ENCOUNTER — Other Ambulatory Visit: Payer: Self-pay | Admitting: Pulmonary Disease

## 2018-10-01 ENCOUNTER — Other Ambulatory Visit: Payer: Self-pay | Admitting: Family Medicine

## 2018-10-01 MED ORDER — ATORVASTATIN CALCIUM 80 MG PO TABS: 80.0000 mg | ORAL_TABLET | Freq: Every day | ORAL | 2 refills | Status: DC

## 2018-10-01 MED ORDER — TAMSULOSIN HCL 0.4 MG PO CAPS: ORAL_CAPSULE | ORAL | 2 refills | Status: DC

## 2018-10-01 NOTE — Telephone Encounter (Signed)
Rxs sent

## 2018-10-01 NOTE — Telephone Encounter (Signed)
Medication: tamsulosin (FLOMAX) 0.4 MG CAPS capsule [[id number]]   atorvastatin (LIPITOR) 80 MG tablet [[id number]]   Pharmacy:  [facility] [address] ([address]), [place] [phone] (Phone) [phone] (Fax)

## 2018-10-23 ENCOUNTER — Telehealth: Payer: Self-pay | Admitting: Family Medicine

## 2018-10-23 NOTE — Telephone Encounter (Signed)
I called patient to schedule 6 month f/u with Dr. Ethelene Hal. Patient wanted to let Dr. Ethelene Hal know that he was around someone that tested positive for COVID-19. Patient states that he has dry cough and some congestion. Patient wanted to know if he should go get tested. Please advise. Patient phone number is [phone]

## 2018-10-23 NOTE — Telephone Encounter (Signed)
Okay 

## 2018-10-24 DIAGNOSIS — J029 Acute pharyngitis, unspecified: Secondary | ICD-10-CM | POA: Diagnosis not present

## 2018-10-24 DIAGNOSIS — R05 Cough: Secondary | ICD-10-CM | POA: Diagnosis not present

## 2018-10-24 NOTE — Telephone Encounter (Signed)
I spoke with pt. He is currently at [facility] and won't be back until Monday. If he does not get tested down there, he will go get tested at [facility] on Monday. I provided pt with the address to the drive thru testing site.

## 2018-11-10 ENCOUNTER — Other Ambulatory Visit: Payer: Self-pay | Admitting: Pulmonary Disease

## 2018-11-19 ENCOUNTER — Other Ambulatory Visit: Payer: Self-pay | Admitting: Pulmonary Disease

## 2018-11-25 MED ORDER — EZETIMIBE 10 MG PO TABS: 10.0000 mg | ORAL_TABLET | Freq: Every day | ORAL | 2 refills | Status: DC

## 2018-11-25 MED ORDER — METOPROLOL TARTRATE 50 MG PO TABS: 50.0000 mg | ORAL_TABLET | Freq: Two times a day (BID) | ORAL | 1 refills | Status: DC

## 2018-11-25 NOTE — Addendum Note (Signed)
Addended by: Jon Billings on: 11/25/2018 11:31 AM   Modules accepted: Orders

## 2018-12-02 ENCOUNTER — Ambulatory Visit (HOSPITAL_COMMUNITY)
Admission: RE | Admit: 2018-12-02 | Discharge: 2018-12-02 | Disposition: A | Discharge status: Home / Self Care | Payer: Medicare Other | Source: Ambulatory Visit | Attending: Cardiology | Admitting: Cardiology

## 2018-12-02 ENCOUNTER — Other Ambulatory Visit (HOSPITAL_COMMUNITY): Payer: Self-pay | Admitting: Internal Medicine

## 2018-12-02 ENCOUNTER — Other Ambulatory Visit: Payer: Self-pay

## 2018-12-02 DIAGNOSIS — I6523 Occlusion and stenosis of bilateral carotid arteries: Secondary | ICD-10-CM | POA: Insufficient documentation

## 2018-12-15 DIAGNOSIS — E119 Type 2 diabetes mellitus without complications: Secondary | ICD-10-CM | POA: Diagnosis not present

## 2018-12-15 LAB — HM DIABETES EYE EXAM

## 2018-12-16 ENCOUNTER — Encounter: Payer: Self-pay | Admitting: Family Medicine

## 2019-01-08 ENCOUNTER — Other Ambulatory Visit: Payer: Self-pay | Admitting: Pulmonary Disease

## 2019-01-21 ENCOUNTER — Other Ambulatory Visit: Payer: Self-pay | Admitting: Pulmonary Disease

## 2019-01-22 ENCOUNTER — Other Ambulatory Visit: Payer: Self-pay

## 2019-01-22 MED ORDER — METFORMIN HCL 500 MG PO TABS: 500.0000 mg | ORAL_TABLET | Freq: Three times a day (TID) | ORAL | 2 refills | Status: DC

## 2019-02-16 ENCOUNTER — Ambulatory Visit (INDEPENDENT_AMBULATORY_CARE_PROVIDER_SITE_OTHER): Payer: Medicare Other | Admitting: Family Medicine

## 2019-02-16 ENCOUNTER — Encounter: Payer: Self-pay | Admitting: Family Medicine

## 2019-02-16 ENCOUNTER — Other Ambulatory Visit: Payer: Self-pay

## 2019-02-16 VITALS — BP 118/76 | HR 67 | Temp 96.7°F | Ht 66.0 in | Wt 198.4 lb

## 2019-02-16 DIAGNOSIS — E782 Mixed hyperlipidemia: Secondary | ICD-10-CM

## 2019-02-16 DIAGNOSIS — E119 Type 2 diabetes mellitus without complications: Secondary | ICD-10-CM

## 2019-02-16 DIAGNOSIS — Z862 Personal history of diseases of the blood and blood-forming organs and certain disorders involving the immune mechanism: Secondary | ICD-10-CM | POA: Diagnosis not present

## 2019-02-16 DIAGNOSIS — I6523 Occlusion and stenosis of bilateral carotid arteries: Secondary | ICD-10-CM | POA: Diagnosis not present

## 2019-02-16 DIAGNOSIS — I2510 Atherosclerotic heart disease of native coronary artery without angina pectoris: Secondary | ICD-10-CM | POA: Diagnosis not present

## 2019-02-16 DIAGNOSIS — I10 Essential (primary) hypertension: Secondary | ICD-10-CM | POA: Diagnosis not present

## 2019-02-16 DIAGNOSIS — Z23 Encounter for immunization: Secondary | ICD-10-CM | POA: Diagnosis not present

## 2019-02-16 DIAGNOSIS — R3911 Hesitancy of micturition: Secondary | ICD-10-CM | POA: Diagnosis not present

## 2019-02-16 DIAGNOSIS — N401 Enlarged prostate with lower urinary tract symptoms: Secondary | ICD-10-CM

## 2019-02-16 LAB — URINALYSIS, ROUTINE W REFLEX MICROSCOPIC
Bilirubin Urine: NEGATIVE
Hgb urine dipstick: NEGATIVE
Ketones, ur: NEGATIVE
Leukocytes,Ua: NEGATIVE
Nitrite: NEGATIVE
RBC / HPF: NONE SEEN (ref 0–?)
Specific Gravity, Urine: 1.015 (ref 1.000–1.030)
Total Protein, Urine: NEGATIVE
Urine Glucose: NEGATIVE
Urobilinogen, UA: 0.2 (ref 0.0–1.0)
pH: 6 (ref 5.0–8.0)

## 2019-02-16 LAB — COMPREHENSIVE METABOLIC PANEL
ALT: 35 U/L (ref 0–53)
AST: 24 U/L (ref 0–37)
Albumin: 4.4 g/dL (ref 3.5–5.2)
Alkaline Phosphatase: 75 U/L (ref 39–117)
BUN: 12 mg/dL (ref 6–23)
CO2: 27 mEq/L (ref 19–32)
Calcium: 9.6 mg/dL (ref 8.4–10.5)
Chloride: 102 mEq/L (ref 96–112)
Creatinine, Ser: 0.64 mg/dL (ref 0.40–1.50)
GFR: 123.55 mL/min (ref >60.00)
Glucose, Bld: 151 mg/dL — ABNORMAL HIGH (ref 70–99)
Potassium: 4.6 mEq/L (ref 3.5–5.1)
Sodium: 138 mEq/L (ref 135–145)
Total Bilirubin: 0.6 mg/dL (ref 0.2–1.2)
Total Protein: 6.6 g/dL (ref 6.0–8.3)

## 2019-02-16 LAB — MICROALBUMIN / CREATININE URINE RATIO
Creatinine,U: 40.1 mg/dL
Microalb Creat Ratio: 11.4 mg/g (ref 0.0–30.0)
Microalb, Ur: 4.6 mg/dL — ABNORMAL HIGH (ref 0.0–1.9)

## 2019-02-16 LAB — CBC
HCT: 40.5 % (ref 39.0–52.0)
Hemoglobin: 14.2 g/dL (ref 13.0–17.0)
MCHC: 35 g/dL (ref 30.0–36.0)
MCV: 85.5 fl (ref 78.0–100.0)
Platelets: 252 10*3/uL (ref 150.0–400.0)
RBC: 4.74 Mil/uL (ref 4.22–5.81)
RDW: 13.7 % (ref 11.5–15.5)
WBC: 6.9 10*3/uL (ref 4.0–10.5)

## 2019-02-16 LAB — LIPID PANEL
Cholesterol: 145 mg/dL (ref 0–200)
HDL: 46.9 mg/dL (ref >39.00)
LDL Cholesterol: 76 mg/dL (ref 0–99)
NonHDL: 97.99
Total CHOL/HDL Ratio: 3
Triglycerides: 112 mg/dL (ref 0.0–149.0)
VLDL: 22.4 mg/dL (ref 0.0–40.0)

## 2019-02-16 LAB — HEMOGLOBIN A1C: Hgb A1c MFr Bld: 7.1 % — ABNORMAL HIGH (ref 4.6–6.5)

## 2019-02-16 LAB — LDL CHOLESTEROL, DIRECT: Direct LDL: 83 mg/dL

## 2019-02-16 LAB — PSA: PSA: 2.24 ng/mL (ref 0.10–4.00)

## 2019-02-16 MED ORDER — VICTOZA 18 MG/3ML ~~LOC~~ SOPN: PEN_INJECTOR | SUBCUTANEOUS | 5 refills | Status: DC

## 2019-02-16 NOTE — Patient Instructions (Signed)
Liraglutide injection What is this medicine? LIRAGLUTIDE (LIR a GLOO tide) is used to improve blood sugar control in adults with type 2 diabetes. This medicine may be used with other diabetes medicines. This drug may also reduce the risk of heart attack or stroke if you have type 2 diabetes and risk factors for heart disease. This medicine may be used for other purposes; ask your health care provider or pharmacist if you have questions. COMMON BRAND NAME(S): Victoza What should I tell my health care provider before I take this medicine? They need to know if you have any of these conditions:  endocrine tumors (MEN 2) or if someone in your family had these tumors  gallbladder disease  high cholesterol  history of alcohol abuse problem  history of pancreatitis  kidney disease or if you are on dialysis  liver disease  previous swelling of the tongue, face, or lips with difficulty breathing, difficulty swallowing, hoarseness, or tightening of the throat  stomach problems  thyroid cancer or if someone in your family had thyroid cancer  an unusual or allergic reaction to liraglutide, other medicines, foods, dyes, or preservatives  pregnant or trying to get pregnant  breast-feeding How should I use this medicine? This medicine is for injection under the skin of your upper leg, stomach area, or upper arm. You will be taught how to prepare and give this medicine. Use exactly as directed. Take your medicine at regular intervals. Do not take it more often than directed. It is important that you put your used needles and syringes in a special sharps container. Do not put them in a trash can. If you do not have a sharps container, call your pharmacist or healthcare provider to get one. A special MedGuide will be given to you by the pharmacist with each prescription and refill. Be sure to read this information carefully each time. Talk to your pediatrician regarding the use of this medicine in  children. While this drug may be prescribed for children as young as 48 years of age, precautions do apply. Overdosage: If you think you have taken too much of this medicine contact a poison control center or emergency room at once. NOTE: This medicine is only for you. Do not share this medicine with others. What if I miss a dose? If you miss a dose, take it as soon as you can. If it is almost time for your next dose, take only that dose. Do not take double or extra doses. What may interact with this medicine?  other medicines for diabetes Many medications may cause changes in blood sugar, these include:  alcohol containing beverages  antiviral medicines for HIV or AIDS  aspirin and aspirin-like drugs  certain medicines for blood pressure, heart disease, irregular heart beat  chromium  diuretics  male hormones, such as estrogens or progestins, birth control pills  fenofibrate  gemfibrozil  isoniazid  lanreotide  male hormones or anabolic steroids  MAOIs like Carbex, Eldepryl, Marplan, Nardil, and Parnate  medicines for weight loss  medicines for allergies, asthma, cold, or cough  medicines for depression, anxiety, or psychotic disturbances  niacin  nicotine  NSAIDs, medicines for pain and inflammation, like ibuprofen or naproxen  octreotide  pasireotide  pentamidine  phenytoin  probenecid  quinolone antibiotics such as ciprofloxacin, levofloxacin, ofloxacin  some herbal dietary supplements  steroid medicines such as prednisone or cortisone  sulfamethoxazole; trimethoprim  thyroid hormones Some medications can hide the warning symptoms of low blood sugar (hypoglycemia). You may  need to monitor your blood sugar more closely if you are taking one of these medications. These include:  beta-blockers, often used for high blood pressure or heart problems (examples include atenolol, metoprolol, propranolol)  clonidine  guanethidine  reserpine This  list may not describe all possible interactions. Give your health care provider a list of all the medicines, herbs, non-prescription drugs, or dietary supplements you use. Also tell them if you smoke, drink alcohol, or use illegal drugs. Some items may interact with your medicine. What should I watch for while using this medicine? Visit your doctor or health care professional for regular checks on your progress. Drink plenty of fluids while taking this medicine. Check with your doctor or health care professional if you get an attack of severe diarrhea, nausea, and vomiting. The loss of too much body fluid can make it dangerous for you to take this medicine. A test called the HbA1C (A1C) will be monitored. This is a simple blood test. It measures your blood sugar control over the last 2 to 3 months. You will receive this test every 3 to 6 months. Learn how to check your blood sugar. Learn the symptoms of low and high blood sugar and how to manage them. Always carry a quick-source of sugar with you in case you have symptoms of low blood sugar. Examples include hard sugar candy or glucose tablets. Make sure others know that you can choke if you eat or drink when you develop serious symptoms of low blood sugar, such as seizures or unconsciousness. They must get medical help at once. Tell your doctor or health care professional if you have high blood sugar. You might need to change the dose of your medicine. If you are sick or exercising more than usual, you might need to change the dose of your medicine. Do not skip meals. Ask your doctor or health care professional if you should avoid alcohol. Many nonprescription cough and cold products contain sugar or alcohol. These can affect blood sugar. Pens should never be shared. Even if the needle is changed, sharing may result in passing of viruses like hepatitis or HIV. Wear a medical ID bracelet or chain, and carry a card that describes your disease and details of  your medicine and dosage times. What side effects may I notice from receiving this medicine? Side effects that you should report to your doctor or health care professional as soon as possible:  allergic reactions like skin rash, itching or hives, swelling of the face, lips, or tongue  breathing problems  diarrhea that continues or is severe  lump or swelling on the neck  severe nausea  signs and symptoms of infection like fever or chills; cough; sore throat; pain or trouble passing urine  signs and symptoms of low blood sugar such as feeling anxious, confusion, dizziness, increased hunger, unusually weak or tired, sweating, shakiness, cold, irritable, headache, blurred vision, fast heartbeat, loss of consciousness  signs and symptoms of kidney injury like trouble passing urine or change in the amount of urine  trouble swallowing  unusual stomach upset or pain  vomiting Side effects that usually do not require medical attention (report to your doctor or health care professional if they continue or are bothersome):  constipation  decreased appetite  diarrhea  fatigue  headache  nausea  pain, redness, or irritation at site where injected  stomach upset  stuffy or runny nose This list may not describe all possible side effects. Call your doctor for medical advice  about side effects. You may report side effects to FDA at 1-800-FDA-1088. Where should I keep my medicine? Keep out of the reach of children. Store unopened pen in a refrigerator between 2 and 8 degrees C (36 and 46 degrees F). Do not freeze or use if the medicine has been frozen. Protect from light and excessive heat. After you first use the pen, it can be stored at room temperature between 15 and 30 degrees C (59 and 86 degrees F) or in a refrigerator. Throw away your used pen after 30 days or after the expiration date, whichever comes first. Do not store your pen with the needle attached. If the needle is left  on, medicine may leak from the pen. NOTE: This sheet is a summary. It may not cover all possible information. If you have questions about this medicine, talk to your doctor, pharmacist, or health care provider.  2020 Elsevier/Gold Standard (2017-08-21 15:46:47)

## 2019-02-16 NOTE — Progress Notes (Signed)
Established Patient Office Visit  Subjective:  Patient ID: Kurt Kidd, male    DOB: 27-May-1950  Age: 68 y.o. MRN: [id number]  CC:  Chief Complaint  Patient presents with  . 6 month follow up  . Flu Vaccine    would like today    HPI Kurt Kidd presents for for follow-up of his hypertension, CAD, peripheral vascular disease and BPH.  Continues all medications.  Blood pressure has been well controlled with the with the metoprolol.  Continues high-dose atorvastatin with Zetia and metoprolol for his coronary artery disease and carotid stenosis.  Carotid ultrasounds are now every 6 months.  Has had no symptoms of his stroke include headaches lightheadedness dizziness unilateral weakness.  Urine flow is dependent on the Flomax but is otherwise good.  Adequate diabetic control with the Metformin but he is having some GI side effects.  Past Medical History:  Diagnosis Date  . CAD (coronary artery disease)    DES.. 2004 /  catheterization 2008, 10% narrowing stent site, pain not cardiac  . Colitis, ischemic ([place])   . Colon polyp   . Degenerative joint disease   . Diabetes mellitus 2012  . Diverticulosis   . Dyslipidemia   . Ejection fraction    65-70%, echo, March, 2012  . GERD (gastroesophageal reflux disease)   . Hyperlipidemia   . Hypertension   . Mitral regurgitation    Echo, mild, 2012  . Murmur   . Onychomycosis   . Overweight(278.02)   . Palpitations    Rapid heartbeat and shortness of breath after eating 2008 recurrent episode 2012    Past Surgical History:  Procedure Laterality Date  . ANGIOPLASTY / STENTING FEMORAL  09/09/2002  . CARDIAC CATHETERIZATION    . COLONOSCOPY  2014   Dr Bary Castilla  . COLONOSCOPY  11/26/2007   Dr Patterson/POLYPOID COLONIC MUCOSA WITH HYPEREMIA AND  . COLONOSCOPY WITH PROPOFOL N/A 07/13/2016   Procedure: COLONOSCOPY WITH PROPOFOL;  Surgeon: Robert Bellow, MD;  Location: [facility] ENDOSCOPY;  Service: Endoscopy;  Laterality: N/A;  .  ESOPHAGOGASTRODUODENOSCOPY (EGD) WITH PROPOFOL N/A 07/13/2016   Procedure: ESOPHAGOGASTRODUODENOSCOPY (EGD) WITH PROPOFOL;  Surgeon: Robert Bellow, MD;  Location: [facility] ENDOSCOPY;  Service: Endoscopy;  Laterality: N/A;  . UPPER GI ENDOSCOPY  11/26/2007   Dr Sharlett Iles    Family History  Problem Relation Age of Onset  . Heart failure Mother   . Heart disease Mother   . Diabetes Father        father also had ASHD/ CABG  . Heart disease Father   . Colon cancer Brother 69  . AAA (abdominal aortic aneurysm) Brother 62    Social History   Socioeconomic History  . Marital status: Married    Spouse name: Not on file  . Number of children: Not on file  . Years of education: Not on file  . Highest education level: Not on file  Occupational History  . 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 Smoking status: Former Smoker    Packs/day: 1.00    Years: 26.00    Pack years: 26.00    Types: Cigarettes    Quit date: 03/05/1990    Years since quitting: 28.9  . Smokeless tobacco: Never Used  Substance and Sexual Activity  . Alcohol use: Yes    Alcohol/week: 0.0 standard drinks    Comment: 2 drinks per day  . Drug use: No  . Sexual activity: Not  on file  Other Topics Concern  . Not on file  Social History Narrative  . Not on file   Social Determinants of Health   Financial Resource Strain:   . Difficulty of Paying Living Expenses: Not on file  Food Insecurity:   . Worried About Charity fundraiser in the Last Year: Not on file  . Ran Out of Food in the Last Year: Not on file  Transportation Needs:   . Lack of Transportation (Medical): Not on file  . Lack of Transportation (Non-Medical): Not on file  Physical Activity:   . Days of Exercise per Week: Not on file  . Minutes of Exercise per Session: Not on file  Stress:   . Feeling of Stress : Not on file  Social Connections:   . Frequency of Communication with Friends and Family: Not on  file  . Frequency of Social Gatherings with Friends and Family: Not on file  . Attends Religious Services: Not on file  . Active Member of Clubs or Organizations: Not on file  . Attends Archivist Meetings: Not on file  . Marital Status: Not on file  Intimate Partner Violence:   . Fear of Current or Ex-Partner: Not on file  . Emotionally Abused: Not on file  . Physically Abused: Not on file  . Sexually Abused: Not on file    Outpatient Medications Prior to Visit  Medication Sig Dispense Refill  . ALPRAZolam (XANAX) 0.5 MG tablet TAKE 1/2 TO 1 TABLET BY MOUTH THREE TIMES DAILY AS DIRECTED (Patient taking differently: 3 (three) times daily as needed. TAKE 1/2 TO 1 TABLET BY MOUTH THREE TIMES DAILY AS DIRECTED) 90 tablet 5  . aspirin 81 MG EC tablet Take 1 tablet (81 mg total) by mouth daily. 30 tablet 0  . atorvastatin (LIPITOR) 80 MG tablet Take 1 tablet (80 mg total) by mouth daily. 90 tablet 2  . Blood Glucose Monitoring Suppl (ONE TOUCH ULTRA SYSTEM KIT) W/DEVICE KIT 1 kit by Does not apply route once. 1 each 0  . esomeprazole (NEXIUM) 40 MG capsule Take 1 capsule (40 mg total) by mouth daily. Take 30 minutes before a meal. 90 capsule 3  . ezetimibe (ZETIA) 10 MG tablet Take 1 tablet (10 mg total) by mouth daily. 90 tablet 2  . metFORMIN (GLUCOPHAGE) 500 MG tablet Take 1 tablet (500 mg total) by mouth 3 (three) times daily. 270 tablet 2  . methocarbamol (ROBAXIN) 500 MG tablet Take 1 tablet (500 mg total) by mouth 3 (three) times daily as needed for muscle spasms. 90 tablet 0  . metoprolol tartrate (LOPRESSOR) 50 MG tablet Take 1 tablet (50 mg total) by mouth 2 (two) times daily. 180 tablet 1  . tamsulosin (FLOMAX) 0.4 MG CAPS capsule TAKE 1 CAPSULE BY MOUTH ONCE DAILY AFTER SUPPER 90 capsule 2  . nitroGLYCERIN (NITROSTAT) 0.4 MG SL tablet Place 1 tablet (0.4 mg total) under the tongue every 5 (five) minutes as needed. (Patient not taking: Reported on 02/16/2019) 25 tablet 1    No facility-administered medications prior to visit.    No Known Allergies  ROS Review of Systems  Constitutional: Negative.   HENT: Negative.   Eyes: Negative for photophobia and visual disturbance.  Respiratory: Negative.  Negative for chest tightness and shortness of breath.   Cardiovascular: Negative.  Negative for chest pain.  Gastrointestinal: Negative.   Endocrine: Negative for polyphagia and polyuria.  Genitourinary: Negative.  Negative for difficulty urinating, frequency and urgency.  Musculoskeletal: Negative.   Skin: Negative for pallor and rash.  Allergic/Immunologic: Negative for immunocompromised state.  Neurological: Negative for weakness, light-headedness and headaches.  Hematological: Negative.   Psychiatric/Behavioral: Negative.       Objective:    Physical Exam  Constitutional: He is oriented to person, place, and time. He appears well-developed and well-nourished. No distress.  HENT:  Head: Normocephalic and atraumatic.  Right Ear: External ear normal.  Left Ear: External ear normal.  Mouth/Throat: Oropharynx is clear and moist. No oropharyngeal exudate.  Eyes: Pupils are equal, round, and reactive to light. Conjunctivae are normal. Right eye exhibits no discharge. Left eye exhibits no discharge. No scleral icterus.  Neck: No JVD present. No tracheal deviation present. No thyromegaly present.  Cardiovascular: Normal rate, regular rhythm and normal heart sounds.  Pulmonary/Chest: Effort normal and breath sounds normal. No stridor.  Abdominal: Bowel sounds are normal.  Genitourinary: Rectum:     Guaiac result negative.     No rectal mass, anal fissure, tenderness, external hemorrhoid, internal hemorrhoid or abnormal anal tone.  Prostate is enlarged. Prostate is not tender.  Musculoskeletal:        General: No edema.  Lymphadenopathy:    He has no cervical adenopathy.  Neurological: He is alert and oriented to person, place, and time.  Skin: Skin is  warm and dry. He is not diaphoretic.  Psychiatric: He has a normal mood and affect. His behavior is normal.    BP 118/76   Pulse 67   Temp (!) 96.7 F (35.9 C)   Ht 5' 6"  (1.676 m)   Wt 198 lb 6.4 oz (90 kg)   SpO2 97%   BMI 32.02 kg/m  Wt Readings from Last 3 Encounters:  02/16/19 198 lb 6.4 oz (90 kg)  08/26/18 196 lb 2 oz (89 kg)  03/28/18 196 lb 6 oz (89.1 kg)     Health Maintenance Due  Topic Date Due  . Hepatitis C Screening  07/27/48  . FOOT EXAM  11/19/1958  . PNA vac Low Risk Adult (2 of 2 - PPSV23) 05/28/2018    There are no preventive care reminders to display for this patient.  Lab Results  Component Value Date   TSH 1.95 05/27/2017   Lab Results  Component Value Date   WBC 7.3 08/26/2018   HGB 14.2 08/26/2018   HCT 41.8 08/26/2018   MCV 86.8 08/26/2018   PLT 260.0 08/26/2018   Lab Results  Component Value Date   NA 138 02/17/2018   K 4.6 02/17/2018   CO2 28 02/17/2018   GLUCOSE 146 (H) 02/17/2018   BUN 11 02/17/2018   CREATININE 0.66 02/17/2018   BILITOT 0.6 02/17/2018   ALKPHOS 72 02/17/2018   AST 21 02/17/2018   ALT 34 02/17/2018   PROT 6.8 02/17/2018   ALBUMIN 4.6 02/17/2018   CALCIUM 9.8 02/17/2018   GFR 127.10 02/17/2018   Lab Results  Component Value Date   CHOL 132 08/26/2018   Lab Results  Component Value Date   HDL 49.80 08/26/2018   Lab Results  Component Value Date   LDLCALC 60 08/26/2018   Lab Results  Component Value Date   TRIG 112.0 08/26/2018   Lab Results  Component Value Date   CHOLHDL 3 08/26/2018   Lab Results  Component Value Date   HGBA1C 7.0 (H) 08/26/2018      Assessment & Plan:   Problem List Items Addressed This Visit      Cardiovascular and Mediastinum  Essential hypertension   Relevant Orders   Comp Met (CMET)   CBC   Urinalysis, Routine w reflex microscopic   Urine Microalbumin w/creat. ratio   Coronary artery disease involving native coronary artery of native heart without  angina pectoris   Relevant Medications   liraglutide (VICTOZA) 18 MG/3ML SOPN   Other Relevant Orders   Lipid Profile   Direct LDL   Bilateral carotid artery stenosis   Relevant Orders   Lipid Profile   Direct LDL     Endocrine   Diabetes mellitus type 2, noninsulin dependent (HCC)   Relevant Medications   liraglutide (VICTOZA) 18 MG/3ML SOPN   Other Relevant Orders   Comp Met (CMET)   CBC   HgB A1c   Urinalysis, Routine w reflex microscopic   Urine Microalbumin w/creat. ratio     Genitourinary   Benign prostatic hyperplasia with urinary hesitancy   Relevant Orders   PSA     Other   Mixed hyperlipidemia   Relevant Orders   Lipid Profile   Direct LDL   H/O iron deficiency anemia   Relevant Orders   Iron, TIBC and Ferritin Panel   Need for influenza vaccination - Primary   Relevant Orders   Flu Vaccine QUAD High Dose(Fluad) (Completed)      Meds ordered this encounter  Medications  . DISCONTD: liraglutide (VICTOZA) 18 MG/3ML SOPN    Sig: Start 0.68m SQ once a day for 7 days, then increase to 1.239monce a day    Dispense:  3 pen    Refill:  5  . liraglutide (VICTOZA) 18 MG/3ML SOPN    Sig: Start 0.66m77mQ once a day for 7 days, then increase to 1.2mg45mce a day    Dispense:  3 pen    Refill:  5    Instruct please.    Follow-up: Return in about 3 months (around 05/17/2019), or continue all meds and start victoza..   Continue all current medicines.  Have added Victoza daily.  Hopefully will help improve diabetic control and be helpful regarding weight loss and coronary artery disease.  Follow-up.  3 months. WillLibby Maw

## 2019-02-17 LAB — IRON,TIBC AND FERRITIN PANEL
%SAT: 28 % (calc) (ref 20–48)
Ferritin: 68 ng/mL (ref 24–380)
Iron: 104 ug/dL (ref 50–180)
TIBC: 376 mcg/dL (calc) (ref 250–425)

## 2019-03-17 ENCOUNTER — Ambulatory Visit: Payer: Medicare Other | Admitting: Physician Assistant

## 2019-04-13 ENCOUNTER — Ambulatory Visit (INDEPENDENT_AMBULATORY_CARE_PROVIDER_SITE_OTHER): Payer: Medicare Other | Admitting: Internal Medicine

## 2019-04-13 ENCOUNTER — Encounter: Payer: Self-pay | Admitting: Internal Medicine

## 2019-04-13 ENCOUNTER — Other Ambulatory Visit: Payer: Self-pay

## 2019-04-13 VITALS — BP 150/82 | HR 88 | Ht 66.0 in | Wt 193.0 lb

## 2019-04-13 DIAGNOSIS — I340 Nonrheumatic mitral (valve) insufficiency: Secondary | ICD-10-CM

## 2019-04-13 DIAGNOSIS — I10 Essential (primary) hypertension: Secondary | ICD-10-CM

## 2019-04-13 DIAGNOSIS — E785 Hyperlipidemia, unspecified: Secondary | ICD-10-CM

## 2019-04-13 DIAGNOSIS — I6523 Occlusion and stenosis of bilateral carotid arteries: Secondary | ICD-10-CM

## 2019-04-13 DIAGNOSIS — I2510 Atherosclerotic heart disease of native coronary artery without angina pectoris: Secondary | ICD-10-CM

## 2019-04-13 NOTE — Progress Notes (Signed)
OFFICE NOTE  Chief Complaint:  Routine follow-up  Primary Care Physician: Libby Maw, MD  HPI:  Kurt Kidd is a 69 y.o. male with a past medial history significant for coronary artery disease status post Cypher DES in 2004 to the LAD. He underwent repeat cardiac catheterization in 2008 showing 10% in-stent restenosis and 40% proximal circumflex stenosis as well as 40% proximal RCA stenosis with normal LV function. He also has dyslipidemia and strong family history of premature coronary artery disease in both parents. He reports that his father actually had a carotid endarterectomy as well. Recently he was found to have a left carotid bruit which I also heard on exam today. In September 2017 he was found to have moderate left carotid artery stenosis and no significant stenosis in the right. He is a former smoker and has not had a screening abdominal ultrasound for aneurysm. He did have ultrasound of the abdomen in 2010 which showed no significant aneurysm at the time. Recent lab work was performed 2 weeks ago showing total cholesterol 166, HDL-C 50, LDL-C 99 and triglycerides 82. Non-HDL cholesterol of 115. Goal for aggressive therapy would be to lower the LDL-C less than 70 and non-HDL less than 100. I discussed that with him today. Overall he reports being asymptomatic. He denies any chest pain or worsening shortness of breath. 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  07/16/2016  [name] returns today for follow-up of his studies. He underwent abdominal aortic aneurysm screening Doppler which was negative for aneurysm (based on family history of AAA). He does have a history of moderate left internal carotid artery stenosis and has pending carotid Doppler scheduled for 07/20/2016. He's had no other symptoms of coronary disease. Cholesterol was not at goal and I started him on ezetimibe. He is due for  repeat lipid profile in one month.  02/19/2018  [name] is seen today in follow-up.  He underwent carotid Dopplers recently which shows moderate bilateral carotid artery stenosis.  We had added ezetimibe and recently his labs have improved a total cholesterol 135, triglycerides 126, HDL 46 and LDL 61.  This now puts him at goal.  He is really asymptomatic.  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  That being said he does have a harsh mitral murmur.  He was known to have longstanding mitral valve disease and last had an echo in 2015 which showed moderate mitral regurgitation.  04/13/2019  [name] is seen today in follow-up.  He continues to do well.  He denies any chest pain or shortness of breath.  Overall he feels well.  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  Blood pressure is initially elevated 150/82 however came down to 130/68.  He did have moderate to severe carotid stenosis on his last Dopplers in September.  I recommended following him every 6 months so he will be due for them again this month.  PMHx:  Past Medical History:  Diagnosis Date  . CAD (coronary artery disease)    DES.. 2004 /  catheterization 2008, 10% narrowing stent site, pain not cardiac  . Colitis, ischemic ([place])   . Colon polyp   . Degenerative joint disease   . Diabetes mellitus 2012  . Diverticulosis   .  Dyslipidemia   . Ejection fraction    65-70%, echo, March, 2012  . GERD (gastroesophageal reflux disease)   . Hyperlipidemia   . Hypertension   . Mitral regurgitation    Echo, mild, 2012  . Murmur   . Onychomycosis   . Overweight(278.02)   . Palpitations    Rapid heartbeat and shortness of breath after eating 2008 recurrent episode 2012    Past Surgical History:  Procedure Laterality Date  . ANGIOPLASTY / STENTING FEMORAL  09/09/2002  . CARDIAC CATHETERIZATION    . COLONOSCOPY   2014   Dr Bary Castilla  . COLONOSCOPY  11/26/2007   Dr Patterson/POLYPOID COLONIC MUCOSA WITH HYPEREMIA AND  . COLONOSCOPY WITH PROPOFOL N/A 07/13/2016   Procedure: COLONOSCOPY WITH PROPOFOL;  Surgeon: Robert Bellow, MD;  Location: [facility] END[facility]COPY;  Service: Endoscopy;  Laterality: N/A;  . ESOPHAGOGASTRODUODENOSCOPY (EGD) WITH PROPOFOL N/A 07/13/2016   Procedure: ESOPHAGOGASTRODUODENOSCOPY (EGD) WITH PROPOFOL;  Surgeon: Robert Bellow, MD;  Location: [facility];  Service: Endoscopy;  Laterality: N/A;  . UPPER GI ENDOSCOPY  11/26/2007   Dr Sharlett Iles    FAMHx:  Family History  Problem Relation Age of Onset  . Heart failure Mother   . Heart disease Mother   . Diabetes Father        father also had ASHD/ CABG  . Heart disease Father   . Colon cancer Brother 19  . AAA (abdominal aortic aneurysm) Brother 22    SOCHx:   reports that he quit smoking about 29 years ago. His smoking use included cigarettes. He has a 26.00 pack-year smoking history. He has never used smokeless tobacco. He reports current alcohol use. He reports that he does not use drugs.  ALLERGIES:  No Known Allergies  ROS: Pertinent items noted in HPI and remainder of comprehensive ROS otherwise negative.  HOME MEDS: Current Outpatient Medications on File Prior to Visit  Medication Sig Dispense Refill  . ALPRAZolam (XANAX) 0.5 MG tablet TAKE 1/2 TO 1 TABLET BY MOUTH THREE TIMES DAILY AS DIRECTED (Patient taking differently: 3 (three) times daily as needed. TAKE 1/2 TO 1 TABLET BY MOUTH THREE TIMES DAILY AS DIRECTED) 90 tablet 5  . aspirin 81 MG EC tablet Take 1 tablet (81 mg total) by mouth daily. 30 tablet 0  . atorvastatin (LIPITOR) 80 MG tablet Take 1 tablet (80 mg total) by mouth daily. 90 tablet 2  . Blood Glucose Monitoring Suppl (ONE TOUCH ULTRA SYSTEM KIT) W/DEVICE KIT 1 kit by Does not apply route once. 1 each 0  . esomeprazole (NEXIUM) 40 MG capsule Take 1 capsule (40 mg total) by mouth daily. Take 30  minutes before a meal. 90 capsule 3  . ezetimibe (ZETIA) 10 MG tablet Take 1 tablet (10 mg total) by mouth daily. 90 tablet 2  . liraglutide (VICTOZA) 18 MG/3ML SOPN Start 0.49m SQ once a day for 7 days, then increase to 1.214monce a day 3 pen 5  . metFORMIN (GLUCOPHAGE) 500 MG tablet Take 1 tablet (500 mg total) by mouth 3 (three) times daily. 270 tablet 2  . methocarbamol (ROBAXIN) 500 MG tablet Take 1 tablet (500 mg total) by mouth 3 (three) times daily as needed for muscle spasms. 90 tablet 0  . metoprolol tartrate (LOPRESSOR) 50 MG tablet Take 1 tablet (50 mg total) by mouth 2 (two) times daily. 180 tablet 1  . nitroGLYCERIN (NITROSTAT) 0.4 MG SL tablet Place 1 tablet (0.4 mg total) under the tongue every 5 (five) minutes as  needed. 25 tablet 1  . tamsulosin (FLOMAX) 0.4 MG CAPS capsule TAKE 1 CAPSULE BY MOUTH ONCE DAILY AFTER SUPPER 90 capsule 2   No current facility-administered medications on file prior to visit.    LABS/IMAGING: No results found for this or any previous visit (from the past 48 hour(s)). No results found.  WEIGHTS: Wt Readings from Last 3 Encounters:  04/13/19 193 lb (87.5 kg)  02/16/19 198 lb 6.4 oz (90 kg)  08/26/18 196 lb 2 oz (89 kg)    VITALS: BP (!) 150/82   Pulse 88   Ht 5' 6"  (1.676 m)   Wt 193 lb (87.5 kg)   SpO2 96%   BMI 31.15 kg/m   EXAM: General appearance: alert and no distress Neck: no carotid bruit, no JVD and thyroid not enlarged, symmetric, no tenderness/mass/nodules Lungs: clear to auscultation bilaterally Heart: regular rate and rhythm and systolic murmur: holosystolic 3/6, blowing at apex Abdomen: soft, non-tender; bowel sounds normal; no masses,  no organomegaly Extremities: extremities normal, atraumatic, no cyanosis or edema Pulses: 2+ and symmetric Skin: Skin color, texture, turgor normal. No rashes or lesions Neurologic: Grossly normal Psych: Pleasant  EKG: Sinus rhythm with PVCs at 88, LVH by voltage-personally reviewed   ASSESSMENT: 1. CAD status post Cypher DES to the LAD in 2004, 10% in-stent restenosis in 2008 - moderate proximal LCX and RCA disease at the time 2. Dyslipidemia 3. Moderate left internal carotid artery stenosis 4. Former smoker 5. Family history of AAA - no AAA by ultrasound 07/2016 6. DM2 7. Moderate mitral regurgitation  PLAN: 1.   Mr. Bonfanti continues to do well denies any chest pain or worsening shortness of breath.  His LDL has gone up over 80 and previously was 61.  He will need to continue to work on some diet and weight loss.  His carotid Dopplers were slightly worse therefore we will plan 23-monthrepeats.  He is due for another one this month.  If his Doppler suggest severe stenosis then I would refer him to vascular surgery.  His father did have carotid endarterectomy.  Follow-up with me in 6 months.  KPixie Casino MD, FNorthern California Surgery Center LP FFreetownDirector of the Advanced Lipid Disorders &  Cardiovascular Risk Reduction Clinic Diplomate of the American Board of Clinical Lipidology Attending Cardiologist  Direct Dial: [phone] Fax: [phone] Website:  [email or web address]/10/2019, 3:43 PM

## 2019-04-13 NOTE — Patient Instructions (Signed)
Medication Instructions:  Your physician recommends that you continue on your current medications as directed. Please refer to the Current Medication list given to you today.  If you need a refill on your cardiac medications before your next appointment, please call your pharmacy.   Lab work: Fasting Lipids in 6 months prior to follow-up visit If you have labs (blood work) drawn today and your tests are completely normal, you will receive your results only by: [place] (if you have MyChart) OR A paper copy in the mail If you have any lab test that is abnormal or we need to change your treatment, we will call you to review the results.  Testing/Procedures: Your physician has requested that you have a carotid duplex in February. This test is an ultrasound of the carotid arteries in your neck. It looks at blood flow through these arteries that supply the brain with blood. Allow one hour for this exam. There are no restrictions or special instructions.   Follow-Up: At [facility], you and your health needs are our priority.  As part of our continuing mission to provide you with exceptional heart care, we have created designated Provider Care Teams.  These Care Teams include your primary Cardiologist (physician) and Advanced Practice Providers (APPs -  Physician Assistants and Nurse Practitioners) who all work together to provide you with the care you need, when you need it. You may see Dr. Debara Pickett or one of the following Advanced Practice Providers on your designated Care Team:    Almyra Deforest, PA-C  Fabian Sharp, [place] or   Roby Lofts, [place]  Your physician wants you to follow-up in: [place]

## 2019-04-20 ENCOUNTER — Other Ambulatory Visit (HOSPITAL_COMMUNITY): Payer: Self-pay | Admitting: Internal Medicine

## 2019-04-20 ENCOUNTER — Other Ambulatory Visit: Payer: Self-pay

## 2019-04-20 ENCOUNTER — Ambulatory Visit (HOSPITAL_COMMUNITY)
Admission: RE | Admit: 2019-04-20 | Discharge: 2019-04-20 | Disposition: A | Discharge status: Home / Self Care | Payer: Medicare Other | Source: Ambulatory Visit | Attending: Cardiovascular Disease | Admitting: Cardiovascular Disease

## 2019-04-20 DIAGNOSIS — I6523 Occlusion and stenosis of bilateral carotid arteries: Secondary | ICD-10-CM | POA: Diagnosis not present

## 2019-05-18 ENCOUNTER — Other Ambulatory Visit: Payer: Self-pay

## 2019-05-18 ENCOUNTER — Encounter: Payer: Self-pay | Admitting: Family Medicine

## 2019-05-18 ENCOUNTER — Ambulatory Visit (INDEPENDENT_AMBULATORY_CARE_PROVIDER_SITE_OTHER): Payer: Medicare Other | Admitting: Family Medicine

## 2019-05-18 VITALS — BP 128/70 | HR 73 | Temp 96.4°F | Ht 66.0 in | Wt 195.6 lb

## 2019-05-18 DIAGNOSIS — G8929 Other chronic pain: Secondary | ICD-10-CM

## 2019-05-18 DIAGNOSIS — H60542 Acute eczematoid otitis externa, left ear: Secondary | ICD-10-CM | POA: Diagnosis not present

## 2019-05-18 DIAGNOSIS — M545 Low back pain: Secondary | ICD-10-CM | POA: Diagnosis not present

## 2019-05-18 DIAGNOSIS — I10 Essential (primary) hypertension: Secondary | ICD-10-CM | POA: Diagnosis not present

## 2019-05-18 DIAGNOSIS — M722 Plantar fascial fibromatosis: Secondary | ICD-10-CM | POA: Insufficient documentation

## 2019-05-18 MED ORDER — DICLOFENAC SODIUM 1 % EX GEL: CUTANEOUS | 3 refills | Status: DC

## 2019-05-18 MED ORDER — METHOCARBAMOL 500 MG PO TABS: 500.0000 mg | ORAL_TABLET | Freq: Three times a day (TID) | ORAL | 0 refills | Status: DC | PRN

## 2019-05-18 MED ORDER — PREDNISOLONE ACETATE 0.12 % OP SUSP: OPHTHALMIC | 1 refills | Status: DC

## 2019-05-18 NOTE — Patient Instructions (Signed)
Plantar Fasciitis  Plantar fasciitis is a painful foot condition that affects the heel. It occurs when the band of tissue that connects the toes to the heel bone (plantar fascia) becomes irritated. This can happen as the result of exercising too much or doing other repetitive activities (overuse injury). The pain from plantar fasciitis can range from mild irritation to severe pain that makes it difficult to walk or move. The pain is usually worse in the morning after sleeping, or after sitting or lying down for a while. Pain may also be worse after long periods of walking or standing. What are the causes? This condition may be caused by:  Standing for long periods of time.  Wearing shoes that do not have good arch support.  Doing activities that put stress on joints (high-impact activities), including running, aerobics, and ballet.  Being overweight.  An abnormal way of walking (gait).  Tight muscles in the back of your lower leg (calf).  High arches in your feet.  Starting a new athletic activity. What are the signs or symptoms? The main symptom of this condition is heel pain. Pain may:  Be worse with first steps after a time of rest, especially in the morning after sleeping or after you have been sitting or lying down for a while.  Be worse after long periods of standing still.  Decrease after 30-45 minutes of activity, such as gentle walking. How is this diagnosed? This condition may be diagnosed based on your medical history and your symptoms. Your health care provider may ask questions about your activity level. Your health care provider will do a physical exam to check for:  A tender area on the bottom of your foot.  A high arch in your foot.  Pain when you move your foot.  Difficulty moving your foot. You may have imaging tests to confirm the diagnosis, such as:  X-rays.  Ultrasound.  MRI. How is this treated? Treatment for plantar fasciitis depends on how  severe your condition is. Treatment may include:  Rest, ice, applying pressure (compression), and raising the affected foot (elevation). This may be called RICE therapy. Your health care provider may recommend RICE therapy along with over-the-counter pain medicines to manage your pain.  Exercises to stretch your calves and your plantar fascia.  A splint that holds your foot in a stretched, upward position while you sleep (night splint).  Physical therapy to relieve symptoms and prevent problems in the future.  Injections of steroid medicine (cortisone) to relieve pain and inflammation.  Stimulating your plantar fascia with electrical impulses (extracorporeal shock wave therapy). This is usually the last treatment option before surgery.  Surgery, if other treatments have not worked after 12 months. Follow these instructions at home:  Managing pain, stiffness, and swelling  If directed, put ice on the painful area: ? Put ice in a plastic bag, or use a frozen bottle of water. ? Place a towel between your skin and the bag or bottle. ? Roll the bottom of your foot over the bag or bottle. ? Do this for 20 minutes, 2-3 times a day.  Wear athletic shoes that have air-sole or gel-sole cushions, or try wearing soft shoe inserts that are designed for plantar fasciitis.  Raise (elevate) your foot above the level of your heart while you are sitting or lying down. Activity  Avoid activities that cause pain. Ask your health care provider what activities are safe for you.  Do physical therapy exercises and stretches as told   by your health care provider.  Try activities and forms of exercise that are easier on your joints (low-impact). Examples include swimming, water aerobics, and biking. General instructions  Take over-the-counter and prescription medicines only as told by your health care provider.  Wear a night splint while sleeping, if told by your health care provider. Loosen the splint  if your toes tingle, become numb, or turn cold and blue.  Maintain a healthy weight, or work with your health care provider to lose weight as needed.  Keep all follow-up visits as told by your health care provider. This is important. Contact a health care provider if you:  Have symptoms that do not go away after caring for yourself at home.  Have pain that gets worse.  Have pain that affects your ability to move or do your daily activities. Summary  Plantar fasciitis is a painful foot condition that affects the heel. It occurs when the band of tissue that connects the toes to the heel bone (plantar fascia) becomes irritated.  The main symptom of this condition is heel pain that may be worse after exercising too much or standing still for a long time.  Treatment varies, but it usually starts with rest, ice, compression, and elevation (RICE therapy) and over-the-counter medicines to manage pain. This information is not intended to replace advice given to you by your health care provider. Make sure you discuss any questions you have with your health care provider. Document Revised: 02/01/2017 Document Reviewed: 12/17/2016 Elsevier Patient Education  2020 [employer] Inc[employer]  

## 2019-05-18 NOTE — Progress Notes (Signed)
Established Patient Office Visit  Subjective:  Patient ID: Kurt Kidd, male    DOB: 06-05-1950  Age: 69 y.o. MRN: [id number]  CC:  Chief Complaint  Patient presents with  . Follow-up    follow up on hypertension, c/o of spots on left upper thigh pt would like looked at.     HPI RAMEY SCHIFF presents for follow-up of his blood pressure that is well controlled with the metoprolol.  Continues to have some itching in the left ear.  Topical prednisone is helped but it is a little messy.  Has been having pain in the arch of his left foot.  This foot with a significant past medical history of ankle fusion.  Pain is worse after he has been off the foot for a while.  He is requesting a refill of his Robaxin that he uses as needed for chronic spasms in his lower back.  Past Medical History:  Diagnosis Date  . CAD (coronary artery disease)    DES.. 2004 /  catheterization 2008, 10% narrowing stent site, pain not cardiac  . Colitis, ischemic ([place])   . Colon polyp   . Degenerative joint disease   . Diabetes mellitus 2012  . Diverticulosis   . Dyslipidemia   . Ejection fraction    65-70%, echo, March, 2012  . GERD (gastroesophageal reflux disease)   . Hyperlipidemia   . Hypertension   . Mitral regurgitation    Echo, mild, 2012  . Murmur   . Onychomycosis   . Overweight(278.02)   . Palpitations    Rapid heartbeat and shortness of breath after eating 2008 recurrent episode 2012    Past Surgical History:  Procedure Laterality Date  . ANGIOPLASTY / STENTING FEMORAL  09/09/2002  . CARDIAC CATHETERIZATION    . COLONOSCOPY  2014   Dr Bary Castilla  . COLONOSCOPY  11/26/2007   Dr Patterson/POLYPOID COLONIC MUCOSA WITH HYPEREMIA AND  . COLONOSCOPY WITH PROPOFOL N/A 07/13/2016   Procedure: COLONOSCOPY WITH PROPOFOL;  Surgeon: Robert Bellow, MD;  Location: [facility] END[facility]COPY;  Service: Endoscopy;  Laterality: N/A;  . ESOPHAGOGASTRODUODENOSCOPY (EGD) WITH PROPOFOL N/A 07/13/2016   Procedure: ESOPHAGOGASTRODUODENOSCOPY (EGD) WITH PROPOFOL;  Surgeon: Robert Bellow, MD;  Location: [facility] ENDOSCOPY;  Service: Endoscopy;  Laterality: N/A;  . UPPER GI ENDOSCOPY  11/26/2007   Dr Sharlett Iles    Family History  Problem Relation Age of Onset  . Heart failure Mother   . Heart disease Mother   . Diabetes Father        father also had ASHD/ CABG  . Heart disease Father   . Colon cancer Brother 38  . AAA (abdominal aortic aneurysm) Brother 81    Social History   Socioeconomic History  . Marital status: Married    Spouse name: Not on file  . Number of children: Not on file  . Years of education: Not on file  . Highest education level: Not on file  Occupational History  . 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 Smoking status: Former Smoker    Packs/day: 1.00    Years: 26.00    Pack years: 26.00    Types: Cigarettes    Quit date: 03/05/1990    Years since quitting: 29.2  . Smokeless tobacco: Never Used  Substance and Sexual Activity  . Alcohol use: Yes    Alcohol/week: 0.0 standard drinks    Comment: 2 drinks per day  . Drug use:  No  . Sexual activity: Not on file  Other Topics Concern  . Not on file  Social History Narrative  . Not on file   Social Determinants of Health   Financial Resource Strain:   . Difficulty of Paying Living Expenses:   Food Insecurity:   . Worried About Charity fundraiser in the Last Year:   . Arboriculturist in the Last Year:   Transportation Needs:   . Film/video editor (Medical):   Marland Kitchen Lack of Transportation (Non-Medical):   Physical Activity:   . Days of Exercise per Week:   . Minutes of Exercise per Session:   Stress:   . Feeling of Stress :   Social Connections:   . Frequency of Communication with Friends and Family:   . Frequency of Social Gatherings with Friends and Family:   . Attends Religious Services:   . Active Member of Clubs or Organizations:   . Attends English as a second language teacher Meetings:   Marland Kitchen Marital Status:   Intimate Partner Violence:   . Fear of Current or Ex-Partner:   . Emotionally Abused:   Marland Kitchen Physically Abused:   . Sexually Abused:     Outpatient Medications Prior to Visit  Medication Sig Dispense Refill  . aspirin 81 MG EC tablet Take 1 tablet (81 mg total) by mouth daily. 30 tablet 0  . atorvastatin (LIPITOR) 80 MG tablet Take 1 tablet (80 mg total) by mouth daily. 90 tablet 2  . Blood Glucose Monitoring Suppl (ONE TOUCH ULTRA SYSTEM KIT) W/DEVICE KIT 1 kit by Does not apply route once. 1 each 0  . esomeprazole (NEXIUM) 40 MG capsule Take 1 capsule (40 mg total) by mouth daily. Take 30 minutes before a meal. 90 capsule 3  . ezetimibe (ZETIA) 10 MG tablet Take 1 tablet (10 mg total) by mouth daily. 90 tablet 2  . liraglutide (VICTOZA) 18 MG/3ML SOPN Start 0.82m SQ once a day for 7 days, then increase to 1.272monce a day 3 pen 5  . metFORMIN (GLUCOPHAGE) 500 MG tablet Take 1 tablet (500 mg total) by mouth 3 (three) times daily. 270 tablet 2  . metoprolol tartrate (LOPRESSOR) 50 MG tablet Take 1 tablet (50 mg total) by mouth 2 (two) times daily. 180 tablet 1  . nitroGLYCERIN (NITROSTAT) 0.4 MG SL tablet Place 1 tablet (0.4 mg total) under the tongue every 5 (five) minutes as needed. 25 tablet 1  . tamsulosin (FLOMAX) 0.4 MG CAPS capsule TAKE 1 CAPSULE BY MOUTH ONCE DAILY AFTER SUPPER 90 capsule 2  . methocarbamol (ROBAXIN) 500 MG tablet Take 1 tablet (500 mg total) by mouth 3 (three) times daily as needed for muscle spasms. 90 tablet 0  . ALPRAZolam (XANAX) 0.5 MG tablet TAKE 1/2 TO 1 TABLET BY MOUTH THREE TIMES DAILY AS DIRECTED (Patient not taking: Reported on 05/18/2019) 90 tablet 5   No facility-administered medications prior to visit.    No Known Allergies  ROS Review of Systems  Constitutional: Negative.   HENT: Negative.   Eyes: Negative for photophobia and visual disturbance.  Respiratory: Negative.   Cardiovascular: Negative.     Gastrointestinal: Negative.   Endocrine: Negative for polyphagia and polyuria.  Genitourinary: Negative.   Musculoskeletal: Positive for back pain and myalgias.  Skin: Negative for pallor and rash.  Allergic/Immunologic: Negative for immunocompromised state.  Neurological: Negative for light-headedness and headaches.  Hematological: Does not bruise/bleed easily.  Psychiatric/Behavioral: Negative.       Objective:  Physical Exam  Constitutional: He is oriented to person, place, and time. He appears well-developed and well-nourished. No distress.  HENT:  Head: Normocephalic and atraumatic.  Right Ear: Tympanic membrane, external ear and ear canal normal.  Left Ear: Tympanic membrane, external ear and ear canal normal.  Mouth/Throat: Oropharynx is clear and moist.  Eyes: Right eye exhibits no discharge. Left eye exhibits no discharge. No scleral icterus.  Neck: No JVD present. No tracheal deviation present.  Cardiovascular: Normal rate, regular rhythm and normal heart sounds.  Pulmonary/Chest: Effort normal and breath sounds normal. No stridor.  Abdominal: Bowel sounds are normal.  Musculoskeletal:        General: No edema.     Right foot: Decreased range of motion. Tenderness present.       Feet:  Neurological: He is alert and oriented to person, place, and time.  Skin: Skin is warm and dry. He is not diaphoretic.  Psychiatric: He has a normal mood and affect. His behavior is normal.    BP 128/70   Pulse 73   Temp (!) 96.4 F (35.8 C) (Tympanic)   Ht 5' 6"  (1.676 m)   Wt 195 lb 9.6 oz (88.7 kg)   SpO2 95%   BMI 31.57 kg/m  Wt Readings from Last 3 Encounters:  05/18/19 195 lb 9.6 oz (88.7 kg)  04/13/19 193 lb (87.5 kg)  02/16/19 198 lb 6.4 oz (90 kg)     Health Maintenance Due  Topic Date Due  . Hepatitis C Screening  Never done  . FOOT EXAM  Never done  . PNA vac Low Risk Adult (2 of 2 - PPSV23) 05/28/2018    There are no preventive care reminders to  display for this patient.  Lab Results  Component Value Date   TSH 1.95 05/27/2017   Lab Results  Component Value Date   WBC 6.9 02/16/2019   HGB 14.2 02/16/2019   HCT 40.5 02/16/2019   MCV 85.5 02/16/2019   PLT 252.0 02/16/2019   Lab Results  Component Value Date   NA 138 02/16/2019   K 4.6 02/16/2019   CO2 27 02/16/2019   GLUCOSE 151 (H) 02/16/2019   BUN 12 02/16/2019   CREATININE 0.64 02/16/2019   BILITOT 0.6 02/16/2019   ALKPHOS 75 02/16/2019   AST 24 02/16/2019   ALT 35 02/16/2019   PROT 6.6 02/16/2019   ALBUMIN 4.4 02/16/2019   CALCIUM 9.6 02/16/2019   GFR 123.55 02/16/2019   Lab Results  Component Value Date   CHOL 145 02/16/2019   Lab Results  Component Value Date   HDL 46.90 02/16/2019   Lab Results  Component Value Date   LDLCALC 76 02/16/2019   Lab Results  Component Value Date   TRIG 112.0 02/16/2019   Lab Results  Component Value Date   CHOLHDL 3 02/16/2019   Lab Results  Component Value Date   HGBA1C 7.1 (H) 02/16/2019      Assessment & Plan:   Problem List Items Addressed This Visit      Cardiovascular and Mediastinum   Essential hypertension     Nervous and Auditory   Acute eczematoid otitis externa of left ear   Relevant Medications   prednisoLONE acetate (PRED MILD) 0.12 % ophthalmic suspension     Musculoskeletal and Integument   Plantar fasciitis of left foot - Primary   Relevant Medications   diclofenac Sodium (VOLTAREN) 1 % GEL     Other   Low back pain   Relevant  Medications   methocarbamol (ROBAXIN) 500 MG tablet      Meds ordered this encounter  Medications  . prednisoLONE acetate (PRED MILD) 0.12 % ophthalmic suspension    Sig: One drop in left ear daily as needed for itching.    Dispense:  5 mL    Refill:  1  . diclofenac Sodium (VOLTAREN) 1 % GEL    Sig: Apply a dab every 6 hours as needed to heel.    Dispense:  150 g    Refill:  3  . methocarbamol (ROBAXIN) 500 MG tablet    Sig: Take 1 tablet (500  mg total) by mouth 3 (three) times daily as needed for muscle spasms.    Dispense:  90 tablet    Refill:  0    Follow-up: Return in about 6 months (around 11/18/2019), or if symptoms worsen or fail to improve.    Libby Maw, MD

## 2019-06-02 ENCOUNTER — Telehealth: Payer: Self-pay | Admitting: Family Medicine

## 2019-06-02 DIAGNOSIS — H60549 Acute eczematoid otitis externa, unspecified ear: Secondary | ICD-10-CM

## 2019-06-02 NOTE — Telephone Encounter (Signed)
Dr. Ethelene Hal please advise, insurance is not cover rx for prednisoLONE acetate (PRED MILD) 0.12 % ophthalmic suspension  that you sent to on 05/18/2019.

## 2019-06-02 NOTE — Telephone Encounter (Signed)
Kurt Kidd is calling and stated that insurance doesn't cover  pred .12 percent medication and if an alternate be sent to [facility] on [address]. CB is [phone]

## 2019-06-03 MED ORDER — PREDNISOLONE ACETATE 0.12 % OP SUSP: OPHTHALMIC | 0 refills | Status: DC

## 2019-06-03 NOTE — Telephone Encounter (Signed)
Hi P,  Sent in a different formula of the same. Software clearly says that his insurance prefers what I just sent. I asked the pharmacy to let us know what they will cover if not this.

## 2019-06-15 DIAGNOSIS — M79672 Pain in left foot: Secondary | ICD-10-CM | POA: Diagnosis not present

## 2019-06-15 DIAGNOSIS — M898X7 Other specified disorders of bone, ankle and foot: Secondary | ICD-10-CM | POA: Diagnosis not present

## 2019-06-15 DIAGNOSIS — M722 Plantar fascial fibromatosis: Secondary | ICD-10-CM | POA: Diagnosis not present

## 2019-06-21 ENCOUNTER — Other Ambulatory Visit: Payer: Self-pay | Admitting: Family Medicine

## 2019-08-05 ENCOUNTER — Other Ambulatory Visit: Payer: Self-pay | Admitting: Family Medicine

## 2019-08-05 DIAGNOSIS — E782 Mixed hyperlipidemia: Secondary | ICD-10-CM

## 2019-08-05 DIAGNOSIS — I6523 Occlusion and stenosis of bilateral carotid arteries: Secondary | ICD-10-CM

## 2019-10-07 ENCOUNTER — Ambulatory Visit (INDEPENDENT_AMBULATORY_CARE_PROVIDER_SITE_OTHER): Payer: Medicare Other

## 2019-10-07 VITALS — Ht 66.0 in | Wt 195.0 lb

## 2019-10-07 DIAGNOSIS — Z0000 Encounter for general adult medical examination without abnormal findings: Secondary | ICD-10-CM

## 2019-10-07 NOTE — Progress Notes (Signed)
Subjective:   Kurt Kidd is a 69 y.o. male who presents for an Initial Medicare Annual Wellness Visit.  I connected with Yahel today by telephone and verified that I am speaking with the correct person using two identifiers. Location patient: home Location provider: work Persons participating in the virtual visit: patient, Marine scientist.    I discussed the limitations, risks, security and privacy concerns of performing an evaluation and management service by telephone and the availability of in person appointments. I also discussed with the patient that there may be a patient responsible charge related to this service. The patient expressed understanding and verbally consented to this telephonic visit.    Interactive audio and video telecommunications were attempted between this provider and patient, however failed, due to patient having technical difficulties OR patient did not have access to video capability.  We continued and completed visit with audio only.  Some vital signs may be absent or patient reported.   Time Spent with patient on telephone encounter: 20 minutes  Review of Systems   Cardiac Risk Factors include: advanced age (>91mn, >>14women);dyslipidemia;hypertension;obesity (BMI >30kg/m2);male gender;diabetes mellitus    Objective:    Today's Vitals   10/07/19 1027  Weight: 195 lb (88.5 kg)  Height: _0  (1.676 m)  PainSc: 7    Body mass index is 31.47 kg/m.  Advanced Directives 10/07/2019 07/13/2016 07/04/2015  Does Patient Have a Medical Advance Directive? No No No  Would patient like information on creating a medical advance directive? No - Patient declined No - Patient declined -    Current Medications (verified) Outpatient Encounter Medications as of 10/07/2019  Medication Sig  . aspirin 81 MG EC tablet Take 1 tablet (81 mg total) by mouth daily.  .Marland Kitchenatorvastatin (LIPITOR) 80 MG tablet Take 1 tablet by mouth once daily  . Blood Glucose Monitoring Suppl (ONE  TOUCH ULTRA SYSTEM KIT) W/DEVICE KIT 1 kit by Does not apply route once.  . diclofenac Sodium (VOLTAREN) 1 % GEL Apply a dab every 6 hours as needed to heel.  . esomeprazole (NEXIUM) 40 MG capsule Take 1 capsule (40 mg total) by mouth daily. Take 30 minutes before a meal.  . ezetimibe (ZETIA) 10 MG tablet Take 1 tablet by mouth once daily  . metFORMIN (GLUCOPHAGE) 500 MG tablet Take 1 tablet (500 mg total) by mouth 3 (three) times daily.  . methocarbamol (ROBAXIN) 500 MG tablet Take 1 tablet (500 mg total) by mouth 3 (three) times daily as needed for muscle spasms.  . metoprolol tartrate (LOPRESSOR) 50 MG tablet Take 1 tablet (50 mg total) by mouth 2 (two) times daily.  . nitroGLYCERIN (NITROSTAT) 0.4 MG SL tablet Place 1 tablet (0.4 mg total) under the tongue every 5 (five) minutes as needed.  . tamsulosin (FLOMAX) 0.4 MG CAPS capsule TAKE 1 CAPSULE BY MOUTH ONCE DAILY AFTER SUPPER  . ALPRAZolam (XANAX) 0.5 MG tablet TAKE 1/2 TO 1 TABLET BY MOUTH THREE TIMES DAILY AS DIRECTED (Patient not taking: Reported on 05/18/2019)  . liraglutide (VICTOZA) 18 MG/3ML SOPN Start 0.654mSQ once a day for 7 days, then increase to 1.18m37mnce a day (Patient not taking: Reported on 10/07/2019)  . [DISCONTINUED] prednisoLONE acetate (PRED MILD) 0.12 % ophthalmic suspension One drop in ear daily as needed for itching.   No facility-administered encounter medications on file as of 10/07/2019.    Allergies (verified) Patient has no known allergies.   History: Past Medical History:  Diagnosis Date  . CAD (coronary  artery disease)    DES.. 2004 /  catheterization 2008, 10% narrowing stent site, pain not cardiac  . Colitis, ischemic ([place])   . Colon polyp   . Degenerative joint disease   . Diabetes mellitus 2012  . Diverticulosis   . Dyslipidemia   . Ejection fraction    65-70%, echo, March, 2012  . GERD (gastroesophageal reflux disease)   . Hyperlipidemia   . Hypertension   . Mitral regurgitation    Echo, mild,  2012  . Murmur   . Onychomycosis   . Overweight(278.02)   . Palpitations    Rapid heartbeat and shortness of breath after eating 2008 recurrent episode 2012   Past Surgical History:  Procedure Laterality Date  . ANGIOPLASTY / STENTING FEMORAL  09/09/2002  . CARDIAC CATHETERIZATION    . COLONOSCOPY  2014   Dr Bary Castilla  . COLONOSCOPY  11/26/2007   Dr Patterson/POLYPOID COLONIC MUCOSA WITH HYPEREMIA AND  . COLONOSCOPY WITH PROPOFOL N/A 07/13/2016   Procedure: COLONOSCOPY WITH PROPOFOL;  Surgeon: Robert Bellow, MD;  Location: [facility] ENDOSCOPY;  Service: Endoscopy;  Laterality: N/A;  . ESOPHAGOGASTRODUODENOSCOPY (EGD) WITH PROPOFOL N/A 07/13/2016   Procedure: ESOPHAGOGASTRODUODENOSCOPY (EGD) WITH PROPOFOL;  Surgeon: Robert Bellow, MD;  Location: [facility] ENDOSCOPY;  Service: Endoscopy;  Laterality: N/A;  . UPPER GI ENDOSCOPY  11/26/2007   Dr Sharlett Iles   Family History  Problem Relation Age of Onset  . Heart failure Mother   . Heart disease Mother   . Diabetes Father        father also had ASHD/ CABG  . Heart disease Father   . Colon cancer Brother 84  . AAA (abdominal aortic aneurysm) Brother 23   Social History   Socioeconomic History  . Marital status: Married    Spouse name: Not on file  . Number of children: Not on file  . Years of education: Not on file  . Highest education level: Not on file  Occupational History  . 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 Smoking status: Former Smoker    Packs/day: 1.00    Years: 26.00    Pack years: 26.00    Types: Cigarettes    Quit date: 03/05/1990    Years since quitting: 29.6  . Smokeless tobacco: Never Used  Substance and Sexual Activity  . Alcohol use: Yes    Alcohol/week: 0.0 standard drinks    Comment: 2 drinks per day  . Drug use: No  . Sexual activity: Not on file  Other Topics Concern  . Not on file  Social History Narrative  . Not on file   Social Determinants of  Health   Financial Resource Strain: Low Risk   . Difficulty of Paying Living Expenses: Not hard at all  Food Insecurity: No Food Insecurity  . Worried About Charity fundraiser in the Last Year: Never true  . Ran Out of Food in the Last Year: Never true  Transportation Needs: No Transportation Needs  . Lack of Transportation (Medical): No  . Lack of Transportation (Non-Medical): No  Physical Activity: Inactive  . Days of Exercise per Week: 0 days  . Minutes of Exercise per Session: 0 min  Stress: No Stress Concern Present  . Feeling of Stress : Not at all  Social Connections: Moderately Isolated  . Frequency of Communication with Friends and Family: More than three times a week  . Frequency of Social Gatherings with Friends and Family:  More than three times a week  . Attends Religious Services: Never  . Active Member of Clubs or Organizations: No  . Attends Archivist Meetings: Never  . Marital Status: Married   Tobacco Counseling Counseling given: Not Answered   Clinical Intake:  Pre-visit preparation completed: Yes  Pain : 0-10 Pain Score: 7  Pain Type: Chronic pain Pain Location: Foot Pain Orientation: Left Pain Onset: More than a month ago Pain Frequency: Intermittent Pain Relieving Factors: advil  Pain Relieving Factors: advil  Nutritional Status: BMI > 30  Obese Nutritional Risks: None Diabetes: Yes CBG done?: No Did pt. bring in CBG monitor from home?: No  How often do you need to have someone help you when you read instructions, pamphlets, or other written materials from your doctor or pharmacy?: 1 - Never What is the last grade level you completed in school?: [employer]  Nutrition Risk Assessment:  Has the patient had any N/V/D within the last 2 months?  No  Does the patient have any non-healing wounds?  No  Has the patient had any unintentional weight loss or weight gain?  No   Diabetes:  Is the patient diabetic?  Yes  If  diabetic, was a CBG obtained today?  No  Did the patient bring in their glucometer from home?  No phone visit How often do you monitor your CBG's? occasionally.   Financial Strains and Diabetes Management:  Are you having any financial strains with the device, your supplies or your medication? No . States Victoza is too expensive Does the patient want to be seen by Chronic Care Management for management of their diabetes?  No  Would likw to speak with pharmacist to discuss cost saving options for Victoza Would the patient like to be referred to a Nutritionist or for Diabetic Management?  No   Diabetic Exams:  Diabetic Eye Exam: Completed 12/15/2018.   Diabetic Foot Exam:  Pt has been advised about the importance in completing this exam. PCP to complete   Interpreter Needed?: No  Information entered by :: Caroleen Hamman LPN  Activities of Daily Living In your present state of health, do you have any difficulty performing the following activities: 10/07/2019  Hearing? N  Vision? N  Difficulty concentrating or making decisions? N  Walking or climbing stairs? N  Dressing or bathing? N  Doing errands, shopping? N  Preparing Food and eating ? N  Using the Toilet? N  In the past six months, have you accidently leaked urine? N  Do you have problems with loss of bowel control? N  Managing your Medications? N  Managing your Finances? N  Housekeeping or managing your Housekeeping? N  Some recent data might be hidden     Immunizations and Health Maintenance Immunization History  Administered Date(s) Administered  . Fluad Quad(high Dose 65+) 02/16/2019  . Influenza Split 12/26/2011  . Influenza Whole 12/03/2008  . Influenza, High Dose Seasonal PF 11/21/2015, 11/26/2016, 12/02/2017  . Influenza,inj,Quad PF,6+ Mos 12/29/2012, 11/23/2013, 11/16/2014  . PFIZER SARS-COV-2 Vaccination 05/04/2019, 05/25/2019  . Pneumococcal Conjugate-13 05/27/2017  . Tdap 05/11/2014   Health Maintenance  Due  Topic Date Due  . Hepatitis C Screening  Never done  . FOOT EXAM  Never done  . PNA vac Low Risk Adult (2 of 2 - PPSV23) 05/28/2018  . HEMOGLOBIN A1C  08/17/2019  . INFLUENZA VACCINE  10/04/2019    Patient Care Team: Libby Maw, MD as PCP - General (Family Medicine) Teressa Lower  M, MD as Consulting Physician (Pulmonary Disease) Bary Castilla Forest Gleason, MD (General Surgery)  Indicate any recent Medical Services you may have received from other than [facility] providers in the past year (date may be approximate).    Assessment:   This is a routine wellness examination for [place].  Hearing/Vision screen  Hearing Screening   _0  _1  _2  _3  _4  _5  _6  _7  _8   Right ear:           Left ear:           Comments: Mild hearing loss  Vision Screening Comments: Wears glasses Last Eye Exam 12/15/2018 Sees Dr. Hassell Done  Dietary issues and exercise activities discussed: Current Exercise Habits: The patient has a physically strenuous job, but has no regular exercise apart from work., Exercise limited by: None identified  Goals Addressed            This Visit's Progress   . Patient Stated       Maintain current health      Depression Screen PHQ 2/9 Scores 10/07/2019 02/16/2019 07/07/2013 06/26/2012  PHQ - 2 Score 0 0 0 0    Fall Risk Fall Risk  10/07/2019 05/18/2019 02/16/2019  Falls in the past year? 0 0 0  Number falls in past yr: 0 - -  Injury with Fall? 0 - -  Follow up Falls prevention discussed - -    FALL RISK PREVENTION PERTAINING TO THE HOME:  Any stairs in or around the home? Yes  If so, are there any without handrails? No   Home free of loose throw rugs in walkways, pet beds, electrical cords, etc? Yes  Adequate lighting in your home to reduce risk of falls? Yes   ASSISTIVE DEVICES UTILIZED TO PREVENT FALLS:  Life alert? No  Use of a cane, walker or w/c? No  Grab bars in the bathroom? Yes  Shower chair or bench in shower? No    Elevated toilet seat or a handicapped toilet? No    TIMED UP AND GO:  Was the test performed? No . Phone visit    Cognitive Function:No cognitive impairment noted.        Screening Tests Health Maintenance  Topic Date Due  . Hepatitis C Screening  Never done  . FOOT EXAM  Never done  . PNA vac Low Risk Adult (2 of 2 - PPSV23) 05/28/2018  . HEMOGLOBIN A1C  08/17/2019  . INFLUENZA VACCINE  10/04/2019  . OPHTHALMOLOGY EXAM  12/15/2019  . URINE MICROALBUMIN  02/16/2020  . TETANUS/TDAP  05/10/2024  . COLONOSCOPY  07/14/2026  . COVID-19 Vaccine  Completed    Qualifies for Shingles Vaccine? Yes  Zostavax completed No. Due for Shingrix. Education has been provided regarding the importance of this vaccine. Pt has been advised to call insurance company to determine out of pocket expense. Advised may also receive vaccine at local pharmacy or Health Dept. Verbalized acceptance and understanding.  Tdap: Up to Date  Flu Vaccine: Due 11/2019  Pneumococcal Vaccine: Due for Pneumovax-23. Discuss with PCP at next office visit  Covid-19:  Completed vaccines   Cancer Screenings:  Colorectal Screening: Completed 07/13/2016. Repeat every 10 years.  Lung Cancer Screening: (Low Dose CT Chest recommended if Age 68-80 years, 30 pack-year currently smoking OR have quit w/in 15years.) does not qualify.     Additional Screening:  Hepatitis C Screening: does qualify; Discuss with PCP  Vision Screening: Recommended annual ophthalmology exams for early detection of glaucoma and other disorders of the eye.  Is the patient up to date with their annual eye exam?  Yes  Who is the provider or what is the name of the office in which the pt attends annual eye exams? Dr. Hassell Done .  Dental Screening: Recommended annual dental exams for proper oral hygiene  Community Resource Referral:  CRR required this visit?  No        Plan:  I have personally reviewed and addressed the Medicare Annual Wellness  questionnaire and have noted the following in the patient's chart:  A. Medical and social history B. Use of alcohol, tobacco or illicit drugs  C. Current medications and supplements D. Functional ability and status E.  Nutritional status F.  Physical activity G. Advance directives H. List of other physicians I.  Hospitalizations, surgeries, and ER visits in previous 12 months J.  [place] such as hearing and vision if needed, cognitive and depression L. Referrals and appointments   In addition, I have reviewed and discussed with patient certain preventive protocols, quality metrics, and best practice recommendations. A written personalized care plan for preventive services as well as general preventive health recommendations were provided to patient.  Due to this being a telephonic visit, the after visit summary with patients personalized plan was offered to patient via mail or my-chart.  Patient would like to access on my-chart.  Signed,    Marta Antu, LPN   04/13/209  Nurse Health Advisor    Nurse Notes: Referred to [facility]-pharmacist due to patient states Victoza is too expensive.

## 2019-10-07 NOTE — Patient Instructions (Signed)
Kurt Kidd , Thank you for taking time to complete your Medicare Wellness Visit. I appreciate your ongoing commitment to your health goals. Please review the following plan we discussed and let me know if I can assist you in the future.   Screening recommendations/referrals: Colonoscopy: Completed 07/13/2016-Due 07/14/2026 Recommended yearly ophthalmology/optometry visit for glaucoma screening and checkup Recommended yearly dental visit for hygiene and checkup  Vaccinations: Influenza vaccine: Up to Date-Due 11/2019 Pneumococcal vaccine: Pneumovax 23-Due-Discuss with PCP at next office visit Tdap vaccine: Completed 05/11/2014-Due-05/10/2024 Shingles vaccine: Discuss with pharmacy   Covid-19: Completed vaccines  Advanced directives: Discussed. Declined at this time.Information available upon request.  Conditions/risks identified: See problem list  Next appointment: Follow up in one year for your annual wellness visit. 10/12/2020 @ 10:30am  Preventive Care 71 Years and Older, Male Preventive care refers to lifestyle choices and visits with your health care provider that can promote health and wellness. What does preventive care include?  A yearly physical exam. This is also called an annual well check.  Dental exams once or twice a year.  Routine eye exams. Ask your health care provider how often you should have your eyes checked.  Personal lifestyle choices, including:  Daily care of your teeth and gums.  Regular physical activity.  Eating a healthy diet.  Avoiding tobacco and drug use.  Limiting alcohol use.  Practicing safe sex.  Taking low doses of aspirin every day.  Taking vitamin and mineral supplements as recommended by your health care provider. What happens during an annual well check? The services and screenings done by your health care provider during your annual well check will depend on your age, overall health, lifestyle risk factors, and family history of  disease. Counseling  Your health care provider may ask you questions about your:  Alcohol use.  Tobacco use.  Drug use.  Emotional well-being.  Home and relationship well-being.  Sexual activity.  Eating habits.  History of falls.  Memory and ability to understand (cognition).  Work and work Statistician. Screening  You may have the following tests or measurements:  Height, weight, and BMI.  Blood pressure.  Lipid and cholesterol levels. These may be checked every 5 years, or more frequently if you are over 60 years old.  Skin check.  Lung cancer screening. You may have this screening every year starting at age 56 if you have a 30-pack-year history of smoking and currently smoke or have quit within the past 15 years.  Fecal occult blood test (FOBT) of the stool. You may have this test every year starting at age 37.  Flexible sigmoidoscopy or colonoscopy. You may have a sigmoidoscopy every 5 years or a colonoscopy every 10 years starting at age 110.  Prostate cancer screening. Recommendations will vary depending on your family history and other risks.  Hepatitis C blood test.  Hepatitis B blood test.  Sexually transmitted disease (STD) testing.  Diabetes screening. This is done by checking your blood sugar (glucose) after you have not eaten for a while (fasting). You may have this done every 1-3 years.  Abdominal aortic aneurysm (AAA) screening. You may need this if you are a current or former smoker.  Osteoporosis. You may be screened starting at age 89 if you are at high risk. Talk with your health care provider about your test results, treatment options, and if necessary, the need for more tests. Vaccines  Your health care provider may recommend certain vaccines, such as:  Influenza vaccine. This is recommended  every year.  Tetanus, diphtheria, and acellular pertussis (Tdap, Td) vaccine. You may need a Td booster every 10 years.  Zoster vaccine. You may  need this after age 34.  Pneumococcal 13-valent conjugate (PCV13) vaccine. One dose is recommended after age 69.  Pneumococcal polysaccharide (PPSV23) vaccine. One dose is recommended after age 41. Talk to your health care provider about which screenings and vaccines you need and how often you need them. This information is not intended to replace advice given to you by your health care provider. Make sure you discuss any questions you have with your health care provider. Document Released: 03/18/2015 Document Revised: 11/09/2015 Document Reviewed: 12/21/2014 Elsevier Interactive Patient Education  2017 [place] Prevention in the Home Falls can cause injuries. They can happen to people of all ages. There are many things you can do to make your home safe and to help prevent falls. What can I do on the outside of my home?  Regularly fix the edges of walkways and driveways and fix any cracks.  Remove anything that might make you trip as you walk through a door, such as a raised step or threshold.  Trim any bushes or trees on the path to your home.  Use bright outdoor lighting.  Clear any walking paths of anything that might make someone trip, such as rocks or tools.  Regularly check to see if handrails are loose or broken. Make sure that both sides of any steps have handrails.  Any raised decks and porches should have guardrails on the edges.  Have any leaves, snow, or ice cleared regularly.  Use sand or salt on walking paths during winter.  Clean up any spills in your garage right away. This includes oil or grease spills. What can I do in the bathroom?  Use night lights.  Install grab bars by the toilet and in the tub and shower. Do not use towel bars as grab bars.  Use non-skid mats or decals in the tub or shower.  If you need to sit down in the shower, use a plastic, non-slip stool.  Keep the floor dry. Clean up any water that spills on the floor as soon as it  happens.  Remove soap buildup in the tub or shower regularly.  Attach bath mats securely with double-sided non-slip rug tape.  Do not have throw rugs and other things on the floor that can make you trip. What can I do in the bedroom?  Use night lights.  Make sure that you have a light by your bed that is easy to reach.  Do not use any sheets or blankets that are too big for your bed. They should not hang down onto the floor.  Have a firm chair that has side arms. You can use this for support while you get dressed.  Do not have throw rugs and other things on the floor that can make you trip. What can I do in the kitchen?  Clean up any spills right away.  Avoid walking on wet floors.  Keep items that you use a lot in easy-to-reach places.  If you need to reach something above you, use a strong step stool that has a grab bar.  Keep electrical cords out of the way.  Do not use floor polish or wax that makes floors slippery. If you must use wax, use non-skid floor wax.  Do not have throw rugs and other things on the floor that can make you trip. What  can I do with my stairs?  Do not leave any items on the stairs.  Make sure that there are handrails on both sides of the stairs and use them. Fix handrails that are broken or loose. Make sure that handrails are as long as the stairways.  Check any carpeting to make sure that it is firmly attached to the stairs. Fix any carpet that is loose or worn.  Avoid having throw rugs at the top or bottom of the stairs. If you do have throw rugs, attach them to the floor with carpet tape.  Make sure that you have a light switch at the top of the stairs and the bottom of the stairs. If you do not have them, ask someone to add them for you. What else can I do to help prevent falls?  Wear shoes that:  Do not have high heels.  Have rubber bottoms.  Are comfortable and fit you well.  Are closed at the toe. Do not wear sandals.  If you  use a stepladder:  Make sure that it is fully opened. Do not climb a closed stepladder.  Make sure that both sides of the stepladder are locked into place.  Ask someone to hold it for you, if possible.  Clearly mark and make sure that you can see:  Any grab bars or handrails.  First and last steps.  Where the edge of each step is.  Use tools that help you move around (mobility aids) if they are needed. These include:  Canes.  Walkers.  Scooters.  Crutches.  Turn on the lights when you go into a dark area. Replace any light bulbs as soon as they burn out.  Set up your furniture so you have a clear path. Avoid moving your furniture around.  If any of your floors are uneven, fix them.  If there are any pets around you, be aware of where they are.  Review your medicines with your doctor. Some medicines can make you feel dizzy. This can increase your chance of falling. Ask your doctor what other things that you can do to help prevent falls. This information is not intended to replace advice given to you by your health care provider. Make sure you discuss any questions you have with your health care provider. Document Released: 12/16/2008 Document Revised: 07/28/2015 Document Reviewed: 03/26/2014 Elsevier Interactive Patient Education  2017 [employer].

## 2019-10-13 ENCOUNTER — Other Ambulatory Visit: Payer: Self-pay | Admitting: Family Medicine

## 2019-10-30 ENCOUNTER — Telehealth: Payer: Self-pay | Admitting: Internal Medicine

## 2019-10-30 NOTE — Telephone Encounter (Signed)
lvm for patient to return call to get follow up scheduled with [facility] from recall list 

## 2019-11-14 ENCOUNTER — Other Ambulatory Visit: Payer: Self-pay | Admitting: Family Medicine

## 2019-11-14 DIAGNOSIS — E782 Mixed hyperlipidemia: Secondary | ICD-10-CM

## 2019-11-14 DIAGNOSIS — I10 Essential (primary) hypertension: Secondary | ICD-10-CM

## 2019-11-14 DIAGNOSIS — I6523 Occlusion and stenosis of bilateral carotid arteries: Secondary | ICD-10-CM

## 2019-11-17 ENCOUNTER — Telehealth: Payer: Self-pay | Admitting: Family Medicine

## 2019-11-17 NOTE — Progress Notes (Signed)
  Chronic Care Management   Outreach Note  11/17/2019 Name: Kurt Kidd MRN: [id number] DOB: 01-17-1951  Referred by: Libby Maw, MD Reason for referral : No chief complaint on file.   An unsuccessful telephone outreach was attempted today. The patient was referred to the pharmacist for assistance with care management and care coordination.   Follow Up Plan:   Carley Perdue [employer] Scheduler

## 2019-11-23 ENCOUNTER — Other Ambulatory Visit: Payer: Self-pay

## 2019-11-23 ENCOUNTER — Encounter: Payer: Self-pay | Admitting: Family Medicine

## 2019-11-23 ENCOUNTER — Telehealth: Payer: Self-pay | Admitting: Family Medicine

## 2019-11-23 ENCOUNTER — Ambulatory Visit (INDEPENDENT_AMBULATORY_CARE_PROVIDER_SITE_OTHER): Payer: Medicare Other | Admitting: Family Medicine

## 2019-11-23 VITALS — BP 118/68 | HR 71 | Temp 97.6°F | Ht 66.0 in | Wt 192.6 lb

## 2019-11-23 DIAGNOSIS — Z862 Personal history of diseases of the blood and blood-forming organs and certain disorders involving the immune mechanism: Secondary | ICD-10-CM | POA: Diagnosis not present

## 2019-11-23 DIAGNOSIS — E119 Type 2 diabetes mellitus without complications: Secondary | ICD-10-CM

## 2019-11-23 DIAGNOSIS — I10 Essential (primary) hypertension: Secondary | ICD-10-CM

## 2019-11-23 DIAGNOSIS — E782 Mixed hyperlipidemia: Secondary | ICD-10-CM | POA: Diagnosis not present

## 2019-11-23 DIAGNOSIS — M25572 Pain in left ankle and joints of left foot: Secondary | ICD-10-CM

## 2019-11-23 DIAGNOSIS — R3911 Hesitancy of micturition: Secondary | ICD-10-CM

## 2019-11-23 DIAGNOSIS — N401 Enlarged prostate with lower urinary tract symptoms: Secondary | ICD-10-CM

## 2019-11-23 DIAGNOSIS — G8929 Other chronic pain: Secondary | ICD-10-CM

## 2019-11-23 LAB — URINALYSIS, ROUTINE W REFLEX MICROSCOPIC
Bilirubin Urine: NEGATIVE
Hgb urine dipstick: NEGATIVE
Ketones, ur: NEGATIVE
Leukocytes,Ua: NEGATIVE
Nitrite: NEGATIVE
RBC / HPF: NONE SEEN (ref 0–?)
Specific Gravity, Urine: 1.025 (ref 1.000–1.030)
Total Protein, Urine: NEGATIVE
Urine Glucose: NEGATIVE
Urobilinogen, UA: 0.2 (ref 0.0–1.0)
pH: 6 (ref 5.0–8.0)

## 2019-11-23 LAB — LIPID PANEL
Cholesterol: 138 mg/dL (ref 0–200)
HDL: 53.2 mg/dL (ref >39.00)
LDL Cholesterol: 66 mg/dL (ref 0–99)
NonHDL: 85.19
Total CHOL/HDL Ratio: 3
Triglycerides: 98 mg/dL (ref 0.0–149.0)
VLDL: 19.6 mg/dL (ref 0.0–40.0)

## 2019-11-23 LAB — CBC
HCT: 40.9 % (ref 39.0–52.0)
Hemoglobin: 13.9 g/dL (ref 13.0–17.0)
MCHC: 34 g/dL (ref 30.0–36.0)
MCV: 88 fl (ref 78.0–100.0)
Platelets: 303 10*3/uL (ref 150.0–400.0)
RBC: 4.65 Mil/uL (ref 4.22–5.81)
RDW: 14.2 % (ref 11.5–15.5)
WBC: 6.7 10*3/uL (ref 4.0–10.5)

## 2019-11-23 LAB — LDL CHOLESTEROL, DIRECT: Direct LDL: 72 mg/dL

## 2019-11-23 LAB — COMPREHENSIVE METABOLIC PANEL
ALT: 47 U/L (ref 0–53)
AST: 33 U/L (ref 0–37)
Albumin: 4.5 g/dL (ref 3.5–5.2)
Alkaline Phosphatase: 71 U/L (ref 39–117)
BUN: 14 mg/dL (ref 6–23)
CO2: 29 mEq/L (ref 19–32)
Calcium: 9.6 mg/dL (ref 8.4–10.5)
Chloride: 101 mEq/L (ref 96–112)
Creatinine, Ser: 0.67 mg/dL (ref 0.40–1.50)
GFR: 116.93 mL/min (ref >60.00)
Glucose, Bld: 145 mg/dL — ABNORMAL HIGH (ref 70–99)
Potassium: 4.6 mEq/L (ref 3.5–5.1)
Sodium: 140 mEq/L (ref 135–145)
Total Bilirubin: 0.6 mg/dL (ref 0.2–1.2)
Total Protein: 6.6 g/dL (ref 6.0–8.3)

## 2019-11-23 LAB — HEMOGLOBIN A1C: Hgb A1c MFr Bld: 7.3 % — ABNORMAL HIGH (ref 4.6–6.5)

## 2019-11-23 MED ORDER — ATORVASTATIN CALCIUM 80 MG PO TABS: 80.0000 mg | ORAL_TABLET | Freq: Every day | ORAL | 3 refills | Status: DC

## 2019-11-23 MED ORDER — TRAMADOL HCL 50 MG PO TABS: ORAL_TABLET | ORAL | 2 refills | Status: DC

## 2019-11-23 MED ORDER — METOPROLOL TARTRATE 50 MG PO TABS: 50.0000 mg | ORAL_TABLET | Freq: Two times a day (BID) | ORAL | 3 refills | Status: DC

## 2019-11-23 MED ORDER — TAMSULOSIN HCL 0.4 MG PO CAPS: ORAL_CAPSULE | ORAL | 3 refills | Status: DC

## 2019-11-23 NOTE — Telephone Encounter (Signed)
Spoke with Katrina who's calling to verify the type of pain that patient is currently having. Katrina/pharmacist verbally understood left ankle pain is what the notes say.

## 2019-11-23 NOTE — Telephone Encounter (Signed)
Kurt Kidd is calling and wanted to speak to someone regarding a prescription that was sent over, please advise [phone]

## 2019-11-23 NOTE — Progress Notes (Signed)
Established Patient Office Visit  Subjective:  Patient ID: Kurt Kidd, male    DOB: [birth year]  Age: 69 y.o. MRN: [id number]  CC:  Chief Complaint  Patient presents with  . Follow-up    6 month follow up on BP and labs, patient fasting for labs.     HPI Kurt Kidd presents for follow-up of hypertension, elevated cholesterol, diabetes, BPH and chronic left ankle pain.  Ankle is bothering him since a traumatic injury many years ago.  There is limited range of motion and it is bothering him mostly at night when he is trying to sleep.  He has tried Aleve and BC powders with some relief.  Would like something a little stronger.  Blood pressure is controlled with metoprolol.  Cholesterol has been controlled with Setia and atorvastatin.  No issues taking any of these medicines.  Coronary artery disease is stable with above.  He has been able to lose some weight.  Hoping the diabetes is under control.  He is only taking Metformin 3 times daily for this at this point.  No regular dental care.  Past Medical History:  Diagnosis Date  . CAD (coronary artery disease)    DES.. 2004 /  catheterization 2008, 10% narrowing stent site, pain not cardiac  . Colitis, ischemic ([place])   . Colon polyp   . Degenerative joint disease   . Diabetes mellitus 2012  . Diverticulosis   . Dyslipidemia   . Ejection fraction    65-70%, echo, March, 2012  . GERD (gastroesophageal reflux disease)   . Hyperlipidemia   . Hypertension   . Mitral regurgitation    Echo, mild, 2012  . Murmur   . Onychomycosis   . Overweight(278.02)   . Palpitations    Rapid heartbeat and shortness of breath after eating 2008 recurrent episode 2012    Past Surgical History:  Procedure Laterality Date  . ANGIOPLASTY / STENTING FEMORAL  09/09/2002  . CARDIAC CATHETERIZATION    . COLONOSCOPY  2014   Dr Bary Castilla  . COLONOSCOPY  11/26/2007   Dr Patterson/POLYPOID COLONIC MUCOSA WITH HYPEREMIA AND  . COLONOSCOPY WITH PROPOFOL  N/A 07/13/2016   Procedure: COLONOSCOPY WITH PROPOFOL;  Surgeon: Robert Bellow, MD;  Location: [facility] ENDOSCOPY;  Service: Endoscopy;  Laterality: N/A;  . ESOPHAGOGASTRODUODENOSCOPY (EGD) WITH PROPOFOL N/A 07/13/2016   Procedure: ESOPHAGOGASTRODUODENOSCOPY (EGD) WITH PROPOFOL;  Surgeon: Robert Bellow, MD;  Location: [facility] ENDOSCOPY;  Service: Endoscopy;  Laterality: N/A;  . UPPER GI ENDOSCOPY  11/26/2007   Dr Sharlett Iles    Family History  Problem Relation Age of Onset  . Heart failure Mother   . Heart disease Mother   . Diabetes Father        father also had ASHD/ CABG  . Heart disease Father   . Colon cancer Brother 37  . AAA (abdominal aortic aneurysm) Brother 38    Social History   Socioeconomic History  . Marital status: Married    Spouse name: Not on file  . Number of children: Not on file  . Years of education: Not on file  . Highest education level: Not on file  Occupational History  . 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 Smoking status: Former Smoker    Packs/day: 1.00    Years: 26.00    Pack years: 26.00    Types: Cigarettes    Quit date: 03/05/1990    Years since quitting: 29.7  .  Smokeless tobacco: Never Used  Substance and Sexual Activity  . Alcohol use: Yes    Alcohol/week: 0.0 standard drinks    Comment: 2 drinks per day  . Drug use: No  . Sexual activity: Not on file  Other Topics Concern  . Not on file  Social History Narrative  . Not on file   Social Determinants of Health   Financial Resource Strain: Low Risk   . Difficulty of Paying Living Expenses: Not hard at all  Food Insecurity: No Food Insecurity  . Worried About Charity fundraiser in the Last Year: Never true  . Ran Out of Food in the Last Year: Never true  Transportation Needs: No Transportation Needs  . Lack of Transportation (Medical): No  . Lack of Transportation (Non-Medical): No  Physical Activity: Inactive  . Days of Exercise  per Week: 0 days  . Minutes of Exercise per Session: 0 min  Stress: No Stress Concern Present  . Feeling of Stress : Not at all  Social Connections: Moderately Isolated  . Frequency of Communication with Friends and Family: More than three times a week  . Frequency of Social Gatherings with Friends and Family: More than three times a week  . Attends Religious Services: Never  . Active Member of Clubs or Organizations: No  . Attends Archivist Meetings: Never  . Marital Status: Married  Human resources officer Violence: Not At Risk  . Fear of Current or Ex-Partner: No  . Emotionally Abused: No  . Physically Abused: No  . Sexually Abused: No    Outpatient Medications Prior to Visit  Medication Sig Dispense Refill  . aspirin 81 MG EC tablet Take 1 tablet (81 mg total) by mouth daily. 30 tablet 0  . Blood Glucose Monitoring Suppl (ONE TOUCH ULTRA SYSTEM KIT) W/DEVICE KIT 1 kit by Does not apply route once. 1 each 0  . diclofenac Sodium (VOLTAREN) 1 % GEL Apply a dab every 6 hours as needed to heel. 150 g 3  . esomeprazole (NEXIUM) 40 MG capsule Take 1 capsule (40 mg total) by mouth daily. Take 30 minutes before a meal. 90 capsule 3  . ezetimibe (ZETIA) 10 MG tablet Take 1 tablet by mouth once daily 90 tablet 0  . metFORMIN (GLUCOPHAGE) 500 MG tablet Take 1 tablet (500 mg total) by mouth 3 (three) times daily. 270 tablet 2  . nitroGLYCERIN (NITROSTAT) 0.4 MG SL tablet Place 1 tablet (0.4 mg total) under the tongue every 5 (five) minutes as needed. 25 tablet 1  . atorvastatin (LIPITOR) 80 MG tablet Take 1 tablet by mouth once daily 90 tablet 2  . metoprolol tartrate (LOPRESSOR) 50 MG tablet Take 1 tablet by mouth twice daily 180 tablet 0  . tamsulosin (FLOMAX) 0.4 MG CAPS capsule TAKE 1 CAPSULE BY MOUTH ONCE DAILY AFTER SUPPER 90 capsule 0  . ALPRAZolam (XANAX) 0.5 MG tablet TAKE 1/2 TO 1 TABLET BY MOUTH THREE TIMES DAILY AS DIRECTED (Patient not taking: Reported on 05/18/2019) 90 tablet 5    . liraglutide (VICTOZA) 18 MG/3ML SOPN Start 0.76m SQ once a day for 7 days, then increase to 1.263monce a day (Patient not taking: Reported on 10/07/2019) 3 pen 5  . methocarbamol (ROBAXIN) 500 MG tablet Take 1 tablet (500 mg total) by mouth 3 (three) times daily as needed for muscle spasms. (Patient not taking: Reported on 11/23/2019) 90 tablet 0   No facility-administered medications prior to visit.    No Known Allergies  ROS Review of Systems  Constitutional: Negative.   HENT: Negative.   Eyes: Negative for photophobia and visual disturbance.  Respiratory: Negative.   Cardiovascular: Negative.   Gastrointestinal: Negative.   Endocrine: Negative for polyphagia and polyuria.  Genitourinary: Negative.   Musculoskeletal: Positive for arthralgias. Negative for gait problem.  Skin: Negative for pallor and rash.  Allergic/Immunologic: Negative for immunocompromised state.  Neurological: Negative for light-headedness and headaches.  Hematological: Does not bruise/bleed easily.  Psychiatric/Behavioral: Negative.       Objective:    Physical Exam Vitals and nursing note reviewed.  Constitutional:      General: He is not in acute distress.    Appearance: Normal appearance. He is not ill-appearing, toxic-appearing or diaphoretic.  HENT:     Head: Normocephalic and atraumatic.     Right Ear: External ear normal.     Left Ear: External ear normal.  Eyes:     General: No scleral icterus.       Right eye: No discharge.        Left eye: No discharge.     Extraocular Movements: Extraocular movements intact.     Conjunctiva/sclera: Conjunctivae normal.     Pupils: Pupils are equal, round, and reactive to light.  Cardiovascular:     Rate and Rhythm: Normal rate and regular rhythm.  Pulmonary:     Effort: Pulmonary effort is normal.     Breath sounds: Normal breath sounds.  Abdominal:     General: Bowel sounds are normal.  Musculoskeletal:     Cervical back: No rigidity or  tenderness.     Right lower leg: No edema.     Left lower leg: No edema.     Left ankle: Swelling present. No ecchymosis. No tenderness. Decreased range of motion.  Lymphadenopathy:     Cervical: No cervical adenopathy.  Skin:    General: Skin is warm and dry.  Neurological:     Mental Status: He is alert and oriented to person, place, and time.  Psychiatric:        Mood and Affect: Mood normal.        Behavior: Behavior normal.    Diabetic Foot Exam - Simple   Simple Foot Form Visual Inspection See comments: Yes Sensation Testing Intact to touch and monofilament testing bilaterally: Yes Pulse Check Posterior Tibialis and Dorsalis pulse intact bilaterally: Yes Comments  Left ankle is swollen without erythema.  No tenderness to palpation.  There is limited range of motion.    BP 118/68   Pulse 71   Temp 97.6 F (36.4 C) (Tympanic)   Ht 5' 6"  (1.676 m)   Wt 192 lb 9.6 oz (87.4 kg)   SpO2 96%   BMI 31.09 kg/m  Wt Readings from Last 3 Encounters:  11/23/19 192 lb 9.6 oz (87.4 kg)  10/07/19 195 lb (88.5 kg)  05/18/19 195 lb 9.6 oz (88.7 kg)     Health Maintenance Due  Topic Date Due  . Hepatitis C Screening  Never done  . FOOT EXAM  Never done  . PNA vac Low Risk Adult (2 of 2 - PPSV23) 05/28/2018  . HEMOGLOBIN A1C  08/17/2019  . INFLUENZA VACCINE  10/04/2019    There are no preventive care reminders to display for this patient.  Lab Results  Component Value Date   TSH 1.95 05/27/2017   Lab Results  Component Value Date   WBC 6.9 02/16/2019   HGB 14.2 02/16/2019   HCT 40.5 02/16/2019   MCV  85.5 02/16/2019   PLT 252.0 02/16/2019   Lab Results  Component Value Date   NA 138 02/16/2019   K 4.6 02/16/2019   CO2 27 02/16/2019   GLUCOSE 151 (H) 02/16/2019   BUN 12 02/16/2019   CREATININE 0.64 02/16/2019   BILITOT 0.6 02/16/2019   ALKPHOS 75 02/16/2019   AST 24 02/16/2019   ALT 35 02/16/2019   PROT 6.6 02/16/2019   ALBUMIN 4.4 02/16/2019   CALCIUM  9.6 02/16/2019   GFR 123.55 02/16/2019   Lab Results  Component Value Date   CHOL 145 02/16/2019   Lab Results  Component Value Date   HDL 46.90 02/16/2019   Lab Results  Component Value Date   LDLCALC 76 02/16/2019   Lab Results  Component Value Date   TRIG 112.0 02/16/2019   Lab Results  Component Value Date   CHOLHDL 3 02/16/2019   Lab Results  Component Value Date   HGBA1C 7.1 (H) 02/16/2019      Assessment & Plan:   Problem List Items Addressed This Visit      Cardiovascular and Mediastinum   Essential hypertension - Primary   Relevant Medications   atorvastatin (LIPITOR) 80 MG tablet   metoprolol tartrate (LOPRESSOR) 50 MG tablet   Other Relevant Orders   CBC   Comprehensive metabolic panel   Urinalysis, Routine w reflex microscopic   Microalbumin / creatinine urine ratio     Endocrine   Diabetes mellitus type 2, noninsulin dependent (HCC)   Relevant Medications   atorvastatin (LIPITOR) 80 MG tablet   Other Relevant Orders   Comprehensive metabolic panel   Hemoglobin A1c   Urinalysis, Routine w reflex microscopic   Microalbumin / creatinine urine ratio     Genitourinary   Benign prostatic hyperplasia with urinary hesitancy   Relevant Medications   tamsulosin (FLOMAX) 0.4 MG CAPS capsule     Other   Mixed hyperlipidemia   Relevant Medications   atorvastatin (LIPITOR) 80 MG tablet   metoprolol tartrate (LOPRESSOR) 50 MG tablet   Other Relevant Orders   LDL cholesterol, direct   Lipid panel   H/O iron deficiency anemia   Relevant Orders   CBC   Iron, TIBC and Ferritin Panel   Chronic pain of left ankle   Relevant Medications   traMADol (ULTRAM) 50 MG tablet   Other Relevant Orders   Ambulatory referral to Orthopedic Surgery      Meds ordered this encounter  Medications  . atorvastatin (LIPITOR) 80 MG tablet    Sig: Take 1 tablet (80 mg total) by mouth daily.    Dispense:  90 tablet    Refill:  3  . metoprolol tartrate (LOPRESSOR)  50 MG tablet    Sig: Take 1 tablet (50 mg total) by mouth 2 (two) times daily.    Dispense:  180 tablet    Refill:  3  . tamsulosin (FLOMAX) 0.4 MG CAPS capsule    Sig: Take one daily    Dispense:  90 capsule    Refill:  3  . traMADol (ULTRAM) 50 MG tablet    Sig: Take one at night as needed.    Dispense:  30 tablet    Refill:  2    Follow-up: Return in about 6 months (around 05/22/2020).   Continue medicines as above.  May adjust Metformin pending hemoglobin A1c results.  Iron therapy pending results. Libby Maw, MD

## 2019-11-24 LAB — IRON,TIBC AND FERRITIN PANEL
%SAT: 25 % (calc) (ref 20–48)
Ferritin: 79 ng/mL (ref 24–380)
Iron: 98 ug/dL (ref 50–180)
TIBC: 400 mcg/dL (calc) (ref 250–425)

## 2019-11-24 LAB — MICROALBUMIN / CREATININE URINE RATIO
Creatinine,U: 138.8 mg/dL
Microalb Creat Ratio: 4.6 mg/g (ref 0.0–30.0)
Microalb, Ur: 6.3 mg/dL — ABNORMAL HIGH (ref 0.0–1.9)

## 2019-12-01 ENCOUNTER — Telehealth: Payer: Self-pay | Admitting: Family Medicine

## 2019-12-01 NOTE — Progress Notes (Signed)
°  Chronic Care Management   Outreach Note  12/01/2019 Name: Kurt Kidd MRN: [id number] DOB: [birth year]  Referred by: Libby Maw, MD Reason for referral : No chief complaint on file.   A second unsuccessful telephone outreach was attempted today. The patient was referred to pharmacist for assistance with care management and care coordination.  Follow Up Plan:   Carley Perdue [employer] Scheduler

## 2019-12-11 ENCOUNTER — Telehealth: Payer: Self-pay | Admitting: Family Medicine

## 2019-12-11 NOTE — Progress Notes (Signed)
  Chronic Care Management   Outreach Note  12/11/2019 Name: Kurt Kidd MRN: [id number] DOB: 02-Sep-1950  Referred by: Libby Maw, MD Reason for referral : No chief complaint on file.   Third unsuccessful telephone outreach was attempted today. The patient was referred to the pharmacist for assistance with care management and care coordination.   Follow Up Plan:   Carley Perdue [employer] Scheduler

## 2019-12-21 DIAGNOSIS — M12572 Traumatic arthropathy, left ankle and foot: Secondary | ICD-10-CM | POA: Diagnosis not present

## 2019-12-23 ENCOUNTER — Telehealth: Payer: Self-pay

## 2019-12-23 NOTE — Telephone Encounter (Signed)
   Primary Cardiologist: Pixie Casino, MD  Chart reviewed as part of pre-operative protocol coverage. Because of Kurt Kidd past medical history and time since last visit, he will require a follow-up visit in order to better assess preoperative cardiovascular risk.  Pre-op covering staff: - Please schedule appointment and call patient to inform them. If patient already had an upcoming appointment within acceptable timeframe, please add "pre-op clearance" to the appointment notes so provider is aware. - Please contact requesting surgeon's office via preferred method (i.e, phone, fax) to inform them of need for appointment prior to surgery.  If applicable, this message will also be routed to pharmacy pool and/or primary cardiologist for input on holding anticoagulant/antiplatelet agent as requested below so that this information is available to the clearing provider at time of patient's appointment.   Tami Lin Christol Thetford, PA  12/23/2019, 2:11 PM

## 2019-12-23 NOTE — Telephone Encounter (Signed)
   [facility] Pre-operative Risk Assessment    HEARTCARE STAFF: - Please ensure there is not already an duplicate clearance open for this procedure. - Under Visit Info/Reason for Call, type in Other and utilize the format Clearance MM/DD/YY or Clearance TBD. Do not use dashes or single digits. - If request is for dental extraction, please clarify the # of teeth to be extracted.  Request for surgical clearance:  1. What type of surgery is being performed? Left total Ankle  2. When is this surgery scheduled? TBD   3. What type of clearance is required (medical clearance vs. Pharmacy clearance to hold med vs. Both)? Both  4. Are there any medications that need to be held prior to surgery and how long?ASA   5. Practice name and name of physician performing surgery? [facility]. Dr. Wylene Simmer   6. What is the office phone number? [phone]   7.   What is the office fax number? [phone]   8.   Anesthesia type (None, local, MAC, general) ? General   Monia Pouch 12/23/2019, 1:31 PM  _________________________________________________________________   (provider comments below)

## 2019-12-23 NOTE — Telephone Encounter (Signed)
   [facility] Pre-operative Risk Assessment    HEARTCARE STAFF: - Please ensure there is not already an duplicate clearance open for this procedure. - Under Visit Info/Reason for Call, type in Other and utilize the format Clearance MM/DD/YY or Clearance TBD. Do not use dashes or single digits. - If request is for dental extraction, please clarify the # of teeth to be extracted.  Request for surgical clearance:  1. What type of surgery is being performed? Total L Ankle  2. When is this surgery scheduled? TBD   3. What type of clearance is required (medical clearance vs. Pharmacy clearance to hold med vs. Both)? Both  4. Are there any medications that need to be held prior to surgery and how long?ASA   5. Practice name and name of physician performing surgery? Dr. Wylene Simmer   6. What is the office phone number? [phone]   7.   What is the office fax number? [phone]  8.   Anesthesia type (None, local, MAC, general) ? General   Monia Pouch 12/23/2019, 1:47 PM  _________________________________________________________________   (provider comments below)

## 2019-12-24 NOTE — Telephone Encounter (Signed)
Pt has appt with Dr. Debara Pickett 01/19/20. Added pre op clearance needed to appt notes. Will forward notes to MD for upcoming appt. Will send FYI to requesting office pt has appt. Will remove from the pre op call back pool.

## 2020-01-05 DIAGNOSIS — M25572 Pain in left ankle and joints of left foot: Secondary | ICD-10-CM | POA: Diagnosis not present

## 2020-01-19 ENCOUNTER — Other Ambulatory Visit: Payer: Self-pay

## 2020-01-19 ENCOUNTER — Ambulatory Visit: Payer: Medicare Other | Admitting: Internal Medicine

## 2020-01-19 ENCOUNTER — Encounter: Payer: Self-pay | Admitting: Internal Medicine

## 2020-01-19 VITALS — BP 126/70 | HR 68 | Ht 66.0 in | Wt 188.0 lb

## 2020-01-19 DIAGNOSIS — I6523 Occlusion and stenosis of bilateral carotid arteries: Secondary | ICD-10-CM

## 2020-01-19 DIAGNOSIS — I2510 Atherosclerotic heart disease of native coronary artery without angina pectoris: Secondary | ICD-10-CM

## 2020-01-19 DIAGNOSIS — I10 Essential (primary) hypertension: Secondary | ICD-10-CM | POA: Diagnosis not present

## 2020-01-19 DIAGNOSIS — E785 Hyperlipidemia, unspecified: Secondary | ICD-10-CM

## 2020-01-19 DIAGNOSIS — Z01810 Encounter for preprocedural cardiovascular examination: Secondary | ICD-10-CM

## 2020-01-19 NOTE — Progress Notes (Signed)
OFFICE NOTE  Chief Complaint:  Preoperative clearance  Primary Care Physician: Libby Maw, MD  HPI:  Kurt Kidd is a 69 y.o. male with a past medial history significant for coronary artery disease status post Cypher DES in 2004 to the LAD. He underwent repeat cardiac catheterization in 2008 showing 10% in-stent restenosis and 40% proximal circumflex stenosis as well as 40% proximal RCA stenosis with normal LV function. He also has dyslipidemia and strong family history of premature coronary artery disease in both parents. He reports that his father actually had a carotid endarterectomy as well. Recently he was found to have a left carotid bruit which I also heard on exam today. In September 2017 he was found to have moderate left carotid artery stenosis and no significant stenosis in the right. He is a former smoker and has not had a screening abdominal ultrasound for aneurysm. He did have ultrasound of the abdomen in 2010 which showed no significant aneurysm at the time. Recent lab work was performed 2 weeks ago showing total cholesterol 166, HDL-C 50, LDL-C 99 and triglycerides 82. Non-HDL cholesterol of 115. Goal for aggressive therapy would be to lower the LDL-C less than 70 and non-HDL less than 100. I discussed that with him today. Overall he reports being asymptomatic. He denies any chest pain or worsening shortness of breath. 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  07/16/2016  Kurt Kidd returns today for follow-up of his studies. He underwent abdominal aortic aneurysm screening Doppler which was negative for aneurysm (based on family history of AAA). He does have a history of moderate left internal carotid artery stenosis and has pending carotid Doppler scheduled for 07/20/2016. He's had no other symptoms of coronary disease. Cholesterol was not at goal and I started him on ezetimibe. He is  due for repeat lipid profile in one month.  02/19/2018  Kurt Kidd is seen today in follow-up.  He underwent carotid Dopplers recently which shows moderate bilateral carotid artery stenosis.  We had added ezetimibe and recently his labs have improved a total cholesterol 135, triglycerides 126, HDL 46 and LDL 61.  This now puts him at goal.  He is really asymptomatic.  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  That being said he does have a harsh mitral murmur.  He was known to have longstanding mitral valve disease and last had an echo in 2015 which showed moderate mitral regurgitation.  04/13/2019  Kurt Kidd is seen today in follow-up.  He continues to do well.  He denies any chest pain or shortness of breath.  Overall he feels well.  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  Blood pressure is initially elevated 150/82 however came down to 130/68.  He did have moderate to severe carotid stenosis on his last Dopplers in September.  I recommended following him every 6 months so he will be due for them again this month.  01/19/2020  Kurt Kidd is seen today for preoperative clearance.  He continues to struggle with issues with left ankle pain.  He apparently has bone-on-bone and will need left ankle replacement.  He said he had trauma to that in his 27s and it has become significantly worse since that surgery.  From a cardiac standpoint  he is very active.  He does well more than 4 metabolic levels of activity and denies chest pain or worsening shortness of breath.  We continue to follow carotid Dopplers which showed moderate to severe carotid artery stenosis in the left.  He is due for repeat Dopplers in February 2021.  PMHx:  Past Medical History:  Diagnosis Date  . CAD (coronary artery disease)    DES.. 2004 /  catheterization 2008, 10% narrowing stent site, pain not cardiac  .  Colitis, ischemic ([place])   . Colon polyp   . Degenerative joint disease   . Diabetes mellitus 2012  . Diverticulosis   . Dyslipidemia   . Ejection fraction    65-70%, echo, March, 2012  . GERD (gastroesophageal reflux disease)   . Hyperlipidemia   . Hypertension   . Mitral regurgitation    Echo, mild, 2012  . Murmur   . Onychomycosis   . Overweight(278.02)   . Palpitations    Rapid heartbeat and shortness of breath after eating 2008 recurrent episode 2012    Past Surgical History:  Procedure Laterality Date  . ANGIOPLASTY / STENTING FEMORAL  09/09/2002  . CARDIAC CATHETERIZATION    . COLONOSCOPY  2014   Dr Bary Castilla  . COLONOSCOPY  11/26/2007   Dr Patterson/POLYPOID COLONIC MUCOSA WITH HYPEREMIA AND  . COLONOSCOPY WITH PROPOFOL N/A 07/13/2016   Procedure: COLONOSCOPY WITH PROPOFOL;  Surgeon: Robert Bellow, MD;  Location: [facility] ENDOSCOPY;  Service: Endoscopy;  Laterality: N/A;  . ESOPHAGOGASTRODUODENOSCOPY (EGD) WITH PROPOFOL N/A 07/13/2016   Procedure: ESOPHAGOGASTRODUODENOSCOPY (EGD) WITH PROPOFOL;  Surgeon: Robert Bellow, MD;  Location: [facility] ENDOSCOPY;  Service: Endoscopy;  Laterality: N/A;  . UPPER GI ENDOSCOPY  11/26/2007   Dr Sharlett Iles    FAMHx:  Family History  Problem Relation Age of Onset  . Heart failure Mother   . Heart disease Mother   . Diabetes Father        father also had ASHD/ CABG  . Heart disease Father   . Colon cancer Brother 3  . AAA (abdominal aortic aneurysm) Brother 24    SOCHx:   reports that he quit smoking about 29 years ago. His smoking use included cigarettes. He has a 26.00 pack-year smoking history. He has never used smokeless tobacco. He reports current alcohol use. He reports that he does not use drugs.  ALLERGIES:  No Known Allergies  ROS: Pertinent items noted in HPI and remainder of comprehensive ROS otherwise negative.  HOME MEDS: Current Outpatient Medications on File Prior to Visit  Medication Sig Dispense Refill    . aspirin 81 MG EC tablet Take 1 tablet (81 mg total) by mouth daily. 30 tablet 0  . atorvastatin (LIPITOR) 80 MG tablet Take 1 tablet (80 mg total) by mouth daily. 90 tablet 3  . Blood Glucose Monitoring Suppl (ONE TOUCH ULTRA SYSTEM KIT) W/DEVICE KIT 1 kit by Does not apply route once. 1 each 0  . diclofenac Sodium (VOLTAREN) 1 % GEL Apply a dab every 6 hours as needed to heel. 150 g 3  . esomeprazole (NEXIUM) 40 MG capsule Take 1 capsule (40 mg total) by mouth daily. Take 30 minutes before a meal. 90 capsule 3  . ezetimibe (ZETIA) 10 MG tablet Take 1 tablet by mouth once daily 90 tablet 0  . metFORMIN (GLUCOPHAGE) 500 MG tablet Take 1 tablet (500 mg total) by mouth 3 (three) times daily. 270 tablet 2  . metoprolol tartrate (LOPRESSOR) 50 MG tablet Take 1  tablet (50 mg total) by mouth 2 (two) times daily. 180 tablet 3  . nitroGLYCERIN (NITROSTAT) 0.4 MG SL tablet Place 1 tablet (0.4 mg total) under the tongue every 5 (five) minutes as needed. 25 tablet 1  . tamsulosin (FLOMAX) 0.4 MG CAPS capsule Take one daily 90 capsule 3  . traMADol (ULTRAM) 50 MG tablet Take one at night as needed. (Patient not taking: Reported on 01/19/2020) 30 tablet 2   No current facility-administered medications on file prior to visit.    LABS/IMAGING: No results found for this or any previous visit (from the past 48 hour(s)). No results found.  WEIGHTS: Wt Readings from Last 3 Encounters:  01/19/20 188 lb (85.3 kg)  11/23/19 192 lb 9.6 oz (87.4 kg)  10/07/19 195 lb (88.5 kg)    VITALS: BP 126/70   Pulse 68   Ht 5' 6"  (1.676 m)   Wt 188 lb (85.3 kg)   SpO2 96%   BMI 30.34 kg/m   EXAM: General appearance: alert and no distress Neck: no carotid bruit, no JVD and thyroid not enlarged, symmetric, no tenderness/mass/nodules Lungs: clear to auscultation bilaterally Heart: regular rate and rhythm and systolic murmur: holosystolic 3/6, blowing at apex Abdomen: soft, non-tender; bowel sounds normal; no  masses,  no organomegaly Extremities: extremities normal, atraumatic, no cyanosis or edema Pulses: 2+ and symmetric Skin: Skin color, texture, turgor normal. No rashes or lesions Neurologic: Grossly normal Psych: Pleasant  EKG: Normal sinus rhythm at 68-personally reviewed  ASSESSMENT: 1. Acceptable risk for upcoming surgery 2. CAD status post Cypher DES to the LAD in 2004, 10% in-stent restenosis in 2008 - moderate proximal LCX and RCA disease at the time 3. Dyslipidemia 4. Moderate left internal carotid artery stenosis 5. Former smoker 6. Family history of AAA - no AAA by ultrasound 07/2016 7. DM2 8. Moderate mitral regurgitation  PLAN: 1.   Kurt Kidd is at acceptable risk for upcoming left ankle replacement surgery with Dr. Doran Durand.  He denies any active coronary disease.  He denies any angina.  He can do more than 4 metabolic levels of activity and therefore stress testing is not necessary.  His dyslipidemia is reasonably well controlled with LDL just over 70, improved from previous studies.  We continue to follow his carotid arteries which showed moderate to severe left carotid artery stenosis.  No changes to his medicines today.  Follow-up with me annually or sooner as necessary.  Pixie Casino, MD, [facility], [place] Director of the Advanced Lipid Disorders &  Cardiovascular Risk Reduction Clinic Diplomate of the American Board of Clinical Lipidology Attending Cardiologist  Direct Dial: [phone]  Fax: [phone]  Website:  [email or web address] Herrada 01/19/2020, 1:36 PM

## 2020-01-19 NOTE — Patient Instructions (Signed)
Medication Instructions:  Your physician recommends that you continue on your current medications as directed. Please refer to the Current Medication list given to you today.  *If you need a refill on your cardiac medications before your next appointment, please call your pharmacy*   Testing/Procedures: Carotid Doppler due Feb 2022   Follow-Up: At [facility], you and your health needs are our priority.  As part of our continuing mission to provide you with exceptional heart care, we have created designated Provider Care Teams.  These Care Teams include your primary Cardiologist (physician) and Advanced Practice Providers (APPs -  Physician Assistants and Nurse Practitioners) who all work together to provide you with the care you need, when you need it.  We recommend signing up for the patient portal called "MyChart".  Sign up information is provided on this After Visit Summary.  MyChart is used to connect with patients for Virtual Visits (Telemedicine).  Patients are able to view lab/test results, encounter notes, upcoming appointments, etc.  Non-urgent messages can be sent to your provider as well.   To learn more about what you can do with MyChart, go to [email or web address].    Your next appointment:   12 month(s)  The format for your next appointment:   In Person  Provider:   Dr. Lyman Bishop   Other Instructions

## 2020-02-02 ENCOUNTER — Other Ambulatory Visit (HOSPITAL_COMMUNITY): Payer: Self-pay | Admitting: Orthopedic Surgery

## 2020-02-17 ENCOUNTER — Telehealth: Payer: Self-pay

## 2020-02-17 NOTE — Telephone Encounter (Signed)
   [facility] Pre-operative Risk Assessment    Request for surgical clearance:  1. What type of surgery is being performed? TOTAL ANKLE ARTHOPLASTY   2. When is this surgery scheduled? 02-25-2020   3. What type of clearance is required (medical clearance vs. Pharmacy clearance to hold med vs. Both)? BOTH  4. Are there any medications that need to be held prior to surgery and how long? ASA   5. Practice name and name of physician performing surgery? [facility]   6. What is the office phone number? [phone]   7.   What is the office fax number? [phone]  8.   Anesthesia type (None, local, MAC, general) ? GENERAL

## 2020-02-17 NOTE — Telephone Encounter (Signed)
   Pt cleared in note by Dr. Debara Pickett last month.  Will call pt to make sure no change in clinical status since last OV.   Left message for pt to call back. Richardson Dopp, PA-C    02/17/2020 10:55 AM

## 2020-02-19 ENCOUNTER — Encounter (HOSPITAL_BASED_OUTPATIENT_CLINIC_OR_DEPARTMENT_OTHER): Payer: Self-pay | Admitting: Orthopedic Surgery

## 2020-02-19 ENCOUNTER — Other Ambulatory Visit: Payer: Self-pay

## 2020-02-19 NOTE — Progress Notes (Signed)
[place] ([place]), [place] - [address] [place] ([place]) [place] [address] Phone: [phone] Fax: [phone]  [place], [place] - [place] [place] [address] Phone: [phone] Fax: [place], [place] - [place] [address] Phone: [phone] Fax: [phone]      Your procedure is scheduled on 02/25/20.  Report to [facility] Main Entrance "A" at 5:30 A.M., and check in at the Admitting office.  Call this number if you have problems the morning of surgery:  [phone]  Call [phone] if you have any questions prior to your surgery date Monday-Friday 8am-4pm    Remember:  Do not eat after midnight the night before your surgery  You may drink clear liquids until 4:30 the morning of your surgery.   Clear liquids allowed are: Water, Non-Citrus Juices (without pulp), Carbonated Beverages, Clear Tea, Black Coffee Only, and Gatorade  Please complete your bottle of water that was provided to you by 4:30 the morning of surgery.  Please, if able, drink it in one setting. DO NOT SIP.    Take these medicines the morning of surgery with A SIP OF WATER: ezetimibe (ZETIA) metoprolol tartrate (LOPRESSOR)  tamsulosin (FLOMAX)  esomeprazole (NEXIUM)  As Needed: nitroGLYCERIN (NITROSTAT)   As of today, STOP taking any Aspirin (unless otherwise instructed by your surgeon) Aleve, Naproxen, Ibuprofen, Motrin, Advil, Goody's, BC's, all herbal medications, fish oil, and all vitamins.   WHAT DO I DO ABOUT MY DIABETES MEDICATION?   Marland Kitchen Do not take oral diabetes medicines (pills) the morning of surgery.   HOW TO MANAGE YOUR DIABETES BEFORE AND AFTER SURGERY  Why is it important to control my blood sugar before and after surgery? . Improving blood sugar levels before and after surgery helps healing and can limit  problems. . A way of improving blood sugar control is eating a healthy diet by: o  Eating less sugar and carbohydrates o  Increasing activity/exercise o  Talking with your doctor about reaching your blood sugar goals . High blood sugars (greater than 180 mg/dL) can raise your risk of infections and slow your recovery, so you will need to focus on controlling your diabetes during the weeks before surgery. . Make sure that the doctor who takes care of your diabetes knows about your planned surgery including the date and location.  How do I manage my blood sugar before surgery? . Check your blood sugar at least 4 times a day, starting 2 days before surgery, to make sure that the level is not too high or low. . Check your blood sugar the morning of your surgery when you wake up and every 2 hours until you get to the Short Stay unit. o If your blood sugar is less than 70 mg/dL, you will need to treat for low blood sugar: - Do not take insulin. - Treat a low blood sugar (less than 70 mg/dL) with  cup of clear juice (cranberry or apple), 4 glucose tablets, OR glucose gel. - Recheck blood sugar in 15 minutes after treatment (to make sure it is greater than 70 mg/dL). If your blood sugar is not greater than 70 mg/dL on recheck, call [phone] for further instructions. . Report your blood sugar to the short stay nurse when you get to Short Stay.  . If you are admitted to the hospital  after surgery: o Your blood sugar will be checked by the staff and you will probably be given insulin after surgery (instead of oral diabetes medicines) to make sure you have good blood sugar levels. o The goal for blood sugar control after surgery is 80-180 mg/dL.                   Do not wear jewelry            Do not wear lotions, powders, colognes, or deodorant.            Do not shave 48 hours prior to surgery.  Men may shave face and neck.            Do not bring valuables to the hospital.            [facility] is not responsible for any belongings or valuables.  Do NOT Smoke (Tobacco/Vaping) or drink Alcohol 24 hours prior to your procedure If you use a CPAP at night, you may bring all equipment for your overnight stay.   Contacts, glasses, dentures or bridgework may not be worn into surgery.      For patients admitted to the hospital, discharge time will be determined by your treatment team.   Patients discharged the day of surgery will not be allowed to drive home, and someone needs to stay with them for 24 hours.    Special instructions:   [place]- Preparing For Surgery  Before surgery, you can play an important role. Because skin is not sterile, your skin needs to be as free of germs as possible. You can reduce the number of germs on your skin by washing with CHG (chlorahexidine gluconate) Soap before surgery.  CHG is an antiseptic cleaner which kills germs and bonds with the skin to continue killing germs even after washing.    Oral Hygiene is also important to reduce your risk of infection.  Remember - BRUSH YOUR TEETH THE MORNING OF SURGERY WITH YOUR REGULAR TOOTHPASTE  Please do not use if you have an allergy to CHG or antibacterial soaps. If your skin becomes reddened/irritated stop using the CHG.  Do not shave (including legs and underarms) for at least 48 hours prior to first CHG shower. It is OK to shave your face.  Please follow these instructions carefully.   1. Shower the NIGHT BEFORE SURGERY and the MORNING OF SURGERY with CHG Soap.   2. If you chose to wash your hair, wash your hair first as usual with your normal shampoo.  3. After you shampoo, rinse your hair and body thoroughly to remove the shampoo.  4. Use CHG as you would any other liquid soap. You can apply CHG directly to the skin and wash gently with a scrungie or a clean washcloth.   5. Apply the CHG Soap to your body ONLY FROM THE NECK DOWN.  Do not use on open wounds or open sores. Avoid contact with  your eyes, ears, mouth and genitals (private parts). Wash Face and genitals (private parts)  with your normal soap.   6. Wash thoroughly, paying special attention to the area where your surgery will be performed.  7. Thoroughly rinse your body with warm water from the neck down.  8. DO NOT shower/wash with your normal soap after using and rinsing off the CHG Soap.  9. Pat yourself dry with a CLEAN TOWEL.  10. Wear CLEAN PAJAMAS to bed the night before surgery  11. Place  CLEAN SHEETS on your bed the night of your first shower and DO NOT SLEEP WITH PETS.   Day of Surgery: Wear Clean/Comfortable clothing the morning of surgery Do not apply any deodorants/lotions.   Remember to brush your teeth WITH YOUR REGULAR TOOTHPASTE.   Please read over the following fact sheets that you were given.

## 2020-02-21 NOTE — Telephone Encounter (Signed)
I spoke with the patient, there has been no worsening chest discomfort or shortness of breath.  I have resent Dr. Lysbeth Penner note to Dr. Nona Dell office.

## 2020-02-22 ENCOUNTER — Inpatient Hospital Stay (HOSPITAL_COMMUNITY)
Admission: RE | Admit: 2020-02-22 | Discharge: 2020-02-22 | Disposition: A | Discharge status: Home / Self Care | Payer: Medicare Other | Source: Ambulatory Visit

## 2020-02-22 ENCOUNTER — Other Ambulatory Visit (HOSPITAL_COMMUNITY)
Admission: RE | Admit: 2020-02-22 | Discharge: 2020-02-22 | Disposition: A | Discharge status: Home / Self Care | Payer: Medicare Other | Source: Ambulatory Visit | Attending: Orthopedic Surgery | Admitting: Orthopedic Surgery

## 2020-02-22 ENCOUNTER — Encounter (HOSPITAL_BASED_OUTPATIENT_CLINIC_OR_DEPARTMENT_OTHER)
Admission: RE | Admit: 2020-02-22 | Discharge: 2020-02-22 | Disposition: A | Discharge status: Home / Self Care | Payer: Medicare Other | Source: Ambulatory Visit | Attending: Orthopedic Surgery | Admitting: Orthopedic Surgery

## 2020-02-22 DIAGNOSIS — Z01812 Encounter for preprocedural laboratory examination: Secondary | ICD-10-CM | POA: Insufficient documentation

## 2020-02-22 DIAGNOSIS — Z20822 Contact with and (suspected) exposure to covid-19: Secondary | ICD-10-CM | POA: Insufficient documentation

## 2020-02-22 LAB — BASIC METABOLIC PANEL
Anion gap: 10 (ref 5–15)
BUN: 9 mg/dL (ref 8–23)
CO2: 27 mmol/L (ref 22–32)
Calcium: 9.5 mg/dL (ref 8.9–10.3)
Chloride: 100 mmol/L (ref 98–111)
Creatinine, Ser: 0.63 mg/dL (ref 0.61–1.24)
GFR, Estimated: >60 mL/min (ref >60)
Glucose, Bld: 163 mg/dL — ABNORMAL HIGH (ref 70–99)
Potassium: 5.1 mmol/L (ref 3.5–5.1)
Sodium: 137 mmol/L (ref 135–145)

## 2020-02-22 LAB — SARS CORONAVIRUS 2 (TAT 6-24 HRS): SARS Coronavirus 2: NEGATIVE

## 2020-02-22 LAB — SURGICAL PCR SCREEN
MRSA, PCR: NEGATIVE
Staphylococcus aureus: NEGATIVE

## 2020-02-22 NOTE — Progress Notes (Signed)

## 2020-02-24 NOTE — Anesthesia Preprocedure Evaluation (Addendum)
Anesthesia Evaluation  Patient identified by MRN, date of birth, ID band Patient awake    Reviewed: Allergy & Precautions, NPO status , Patient's Chart, lab work & pertinent test results, reviewed documented beta blocker date and time   History of Anesthesia Complications Negative for: history of anesthetic complications  Airway Mallampati: II  TM Distance: >3 FB Neck ROM: Full    Dental no notable dental hx.    Pulmonary former smoker,    Pulmonary exam normal        Cardiovascular hypertension, Pt. on medications and Pt. on home beta blockers + CAD and + Cardiac Stents (2004)  Normal cardiovascular exam  TTE 2019: moderate LVH, mild focalbasal hypertrophy of the septum, EF 50-55%, possible hypokinesis of the apical myocardium, grade 1 DD, mild MR, severe LAE, PASP mildly increased 33 mmHg   Neuro/Psych negative neurological ROS  negative psych ROS   GI/Hepatic Neg liver ROS, GERD  Medicated and Controlled,  Endo/Other  diabetes, Type 2, Oral Hypoglycemic Agents  Renal/GU negative Renal ROS  negative genitourinary   Musculoskeletal negative musculoskeletal ROS (+)   Abdominal   Peds  Hematology negative hematology ROS (+)   Anesthesia Other Findings Day of surgery medications reviewed with patient.  Reproductive/Obstetrics negative OB ROS                            Anesthesia Physical Anesthesia Plan  ASA: III  Anesthesia Plan: General   Post-op Pain Management: GA combined w/ Regional for post-op pain   Induction: Intravenous  PONV Risk Score and Plan: 2 and Treatment may vary due to age or medical condition, Ondansetron and Dexamethasone  Airway Management Planned: LMA  Additional Equipment: None  Intra-op Plan:   Post-operative Plan: Extubation in OR  Informed Consent: I have reviewed the patients History and Physical, chart, labs and discussed the procedure including  the risks, benefits and alternatives for the proposed anesthesia with the patient or authorized representative who has indicated his/her understanding and acceptance.     Dental advisory given  Plan Discussed with: CRNA  Anesthesia Plan Comments:        Anesthesia Quick Evaluation

## 2020-02-25 ENCOUNTER — Ambulatory Visit (HOSPITAL_BASED_OUTPATIENT_CLINIC_OR_DEPARTMENT_OTHER): Payer: Medicare Other | Admitting: Anesthesiology

## 2020-02-25 ENCOUNTER — Ambulatory Visit (HOSPITAL_COMMUNITY): Payer: Medicare Other

## 2020-02-25 ENCOUNTER — Other Ambulatory Visit: Payer: Self-pay

## 2020-02-25 ENCOUNTER — Ambulatory Visit (HOSPITAL_BASED_OUTPATIENT_CLINIC_OR_DEPARTMENT_OTHER)
Admission: RE | Admit: 2020-02-25 | Discharge: 2020-02-25 | Disposition: A | Discharge status: Home / Self Care | Payer: Medicare Other | Attending: Orthopedic Surgery | Admitting: Orthopedic Surgery

## 2020-02-25 ENCOUNTER — Encounter (HOSPITAL_BASED_OUTPATIENT_CLINIC_OR_DEPARTMENT_OTHER): Payer: Self-pay | Admitting: Orthopedic Surgery

## 2020-02-25 ENCOUNTER — Encounter (HOSPITAL_BASED_OUTPATIENT_CLINIC_OR_DEPARTMENT_OTHER)
Admission: RE | Disposition: A | Discharge status: Home / Self Care | Payer: Self-pay | Source: Home / Self Care | Attending: Orthopedic Surgery

## 2020-02-25 DIAGNOSIS — Z9889 Other specified postprocedural states: Secondary | ICD-10-CM | POA: Diagnosis not present

## 2020-02-25 DIAGNOSIS — M19172 Post-traumatic osteoarthritis, left ankle and foot: Secondary | ICD-10-CM | POA: Insufficient documentation

## 2020-02-25 DIAGNOSIS — Z7984 Long term (current) use of oral hypoglycemic drugs: Secondary | ICD-10-CM | POA: Insufficient documentation

## 2020-02-25 DIAGNOSIS — Z87891 Personal history of nicotine dependence: Secondary | ICD-10-CM | POA: Diagnosis not present

## 2020-02-25 DIAGNOSIS — M25772 Osteophyte, left ankle: Secondary | ICD-10-CM | POA: Insufficient documentation

## 2020-02-25 DIAGNOSIS — G8918 Other acute postprocedural pain: Secondary | ICD-10-CM | POA: Diagnosis not present

## 2020-02-25 DIAGNOSIS — Z419 Encounter for procedure for purposes other than remedying health state, unspecified: Secondary | ICD-10-CM

## 2020-02-25 DIAGNOSIS — Z79899 Other long term (current) drug therapy: Secondary | ICD-10-CM | POA: Insufficient documentation

## 2020-02-25 DIAGNOSIS — M6702 Short Achilles tendon (acquired), left ankle: Secondary | ICD-10-CM | POA: Insufficient documentation

## 2020-02-25 DIAGNOSIS — I10 Essential (primary) hypertension: Secondary | ICD-10-CM | POA: Diagnosis not present

## 2020-02-25 DIAGNOSIS — M12572 Traumatic arthropathy, left ankle and foot: Secondary | ICD-10-CM | POA: Diagnosis not present

## 2020-02-25 DIAGNOSIS — Z7982 Long term (current) use of aspirin: Secondary | ICD-10-CM | POA: Diagnosis not present

## 2020-02-25 DIAGNOSIS — E782 Mixed hyperlipidemia: Secondary | ICD-10-CM | POA: Diagnosis not present

## 2020-02-25 HISTORY — PX: TOTAL ANKLE ARTHROPLASTY: SHX811

## 2020-02-25 LAB — GLUCOSE, CAPILLARY
Glucose-Capillary: 151 mg/dL — ABNORMAL HIGH (ref 70–99)
Glucose-Capillary: 164 mg/dL — ABNORMAL HIGH (ref 70–99)

## 2020-02-25 SURGERY — ARTHROPLASTY, ANKLE, TOTAL
Anesthesia: General | Site: Ankle | Laterality: Left

## 2020-02-25 MED ORDER — EPHEDRINE SULFATE 50 MG/ML IJ SOLN
INTRAMUSCULAR | Status: DC | PRN
  Administered 2020-02-25: 5 mg via INTRAVENOUS
  Administered 2020-02-25: 10 mg via INTRAVENOUS

## 2020-02-25 MED ORDER — LIDOCAINE-EPINEPHRINE 1 %-1:100000 IJ SOLN
INTRAMUSCULAR | Status: AC
  Filled 2020-02-25: qty 1

## 2020-02-25 MED ORDER — SENNA 8.6 MG PO TABS: 2.0000 | ORAL_TABLET | Freq: Two times a day (BID) | ORAL | 0 refills | Status: DC

## 2020-02-25 MED ORDER — FENTANYL CITRATE (PF) 100 MCG/2ML IJ SOLN
100.0000 ug | Freq: Once | INTRAMUSCULAR | Status: AC
  Administered 2020-02-25: 07:00:00 50 ug via INTRAVENOUS

## 2020-02-25 MED ORDER — FENTANYL CITRATE (PF) 100 MCG/2ML IJ SOLN
INTRAMUSCULAR | Status: AC
  Filled 2020-02-25: qty 2

## 2020-02-25 MED ORDER — RIVAROXABAN 10 MG PO TABS: 10.0000 mg | ORAL_TABLET | Freq: Every day | ORAL | 0 refills | Status: DC

## 2020-02-25 MED ORDER — LIDOCAINE 2% (20 MG/ML) 5 ML SYRINGE
INTRAMUSCULAR | Status: AC
  Filled 2020-02-25: qty 5

## 2020-02-25 MED ORDER — MIDAZOLAM HCL 2 MG/2ML IJ SOLN
2.0000 mg | Freq: Once | INTRAMUSCULAR | Status: AC
  Administered 2020-02-25: 07:00:00 1 mg via INTRAVENOUS

## 2020-02-25 MED ORDER — MIDAZOLAM HCL 2 MG/2ML IJ SOLN
INTRAMUSCULAR | Status: AC
  Filled 2020-02-25: qty 2

## 2020-02-25 MED ORDER — EPHEDRINE SULFATE-NACL 50-0.9 MG/10ML-% IV SOSY
PREFILLED_SYRINGE | INTRAVENOUS | Status: DC | PRN
  Administered 2020-02-25: 15 mg via INTRAVENOUS

## 2020-02-25 MED ORDER — DEXAMETHASONE SODIUM PHOSPHATE 10 MG/ML IJ SOLN
INTRAMUSCULAR | Status: AC
  Filled 2020-02-25: qty 1

## 2020-02-25 MED ORDER — 0.9 % SODIUM CHLORIDE (POUR BTL) OPTIME
TOPICAL | Status: DC | PRN
  Administered 2020-02-25: 09:00:00 300 mL

## 2020-02-25 MED ORDER — ONDANSETRON HCL 4 MG/2ML IJ SOLN
INTRAMUSCULAR | Status: DC | PRN
  Administered 2020-02-25: 4 mg via INTRAVENOUS

## 2020-02-25 MED ORDER — MIDAZOLAM HCL 5 MG/5ML IJ SOLN
INTRAMUSCULAR | Status: DC | PRN
  Administered 2020-02-25 (×2): 1 mg via INTRAVENOUS

## 2020-02-25 MED ORDER — ACETAMINOPHEN 500 MG PO TABS
1000.0000 mg | ORAL_TABLET | Freq: Once | ORAL | Status: AC
  Administered 2020-02-25: 07:00:00 1000 mg via ORAL

## 2020-02-25 MED ORDER — PROMETHAZINE HCL 25 MG/ML IJ SOLN: 6.2500 mg | INTRAMUSCULAR | Status: DC | PRN

## 2020-02-25 MED ORDER — CEFAZOLIN SODIUM-DEXTROSE 2-4 GM/100ML-% IV SOLN
INTRAVENOUS | Status: AC
  Filled 2020-02-25: qty 100

## 2020-02-25 MED ORDER — SODIUM CHLORIDE 0.9 % IV SOLN: INTRAVENOUS | Status: DC

## 2020-02-25 MED ORDER — VANCOMYCIN HCL 500 MG IV SOLR
INTRAVENOUS | Status: DC | PRN
  Administered 2020-02-25: 500 mg via TOPICAL

## 2020-02-25 MED ORDER — DEXAMETHASONE SODIUM PHOSPHATE 4 MG/ML IJ SOLN
INTRAMUSCULAR | Status: DC | PRN
  Administered 2020-02-25: 4 mg via INTRAVENOUS

## 2020-02-25 MED ORDER — ONDANSETRON HCL 4 MG/2ML IJ SOLN
INTRAMUSCULAR | Status: AC
  Filled 2020-02-25: qty 2

## 2020-02-25 MED ORDER — FENTANYL CITRATE (PF) 100 MCG/2ML IJ SOLN: 25.0000 ug | INTRAMUSCULAR | Status: DC | PRN

## 2020-02-25 MED ORDER — BUPIVACAINE HCL (PF) 0.5 % IJ SOLN
INTRAMUSCULAR | Status: AC
  Filled 2020-02-25: qty 30

## 2020-02-25 MED ORDER — PHENYLEPHRINE HCL (PRESSORS) 10 MG/ML IV SOLN
INTRAVENOUS | Status: AC
  Filled 2020-02-25: qty 1

## 2020-02-25 MED ORDER — DOCUSATE SODIUM 100 MG PO CAPS: 100.0000 mg | ORAL_CAPSULE | Freq: Two times a day (BID) | ORAL | 0 refills | Status: DC

## 2020-02-25 MED ORDER — PROPOFOL 10 MG/ML IV BOLUS
INTRAVENOUS | Status: DC | PRN
  Administered 2020-02-25: 150 mg via INTRAVENOUS

## 2020-02-25 MED ORDER — VANCOMYCIN HCL 500 MG IV SOLR
INTRAVENOUS | Status: AC
  Filled 2020-02-25: qty 500

## 2020-02-25 MED ORDER — ACETAMINOPHEN 500 MG PO TABS
ORAL_TABLET | ORAL | Status: AC
  Filled 2020-02-25: qty 2

## 2020-02-25 MED ORDER — OXYCODONE HCL 5 MG PO TABS: 5.0000 mg | ORAL_TABLET | ORAL | 0 refills | Status: AC | PRN

## 2020-02-25 MED ORDER — EPHEDRINE 5 MG/ML INJ
INTRAVENOUS | Status: AC
  Filled 2020-02-25: qty 10

## 2020-02-25 MED ORDER — PROPOFOL 10 MG/ML IV BOLUS
INTRAVENOUS | Status: AC
  Filled 2020-02-25: qty 20

## 2020-02-25 MED ORDER — PHENYLEPHRINE HCL-NACL 10-0.9 MG/250ML-% IV SOLN
INTRAVENOUS | Status: DC | PRN
  Administered 2020-02-25: 30 ug/min via INTRAVENOUS

## 2020-02-25 MED ORDER — LIDOCAINE HCL 1 % IJ SOLN
INTRAMUSCULAR | Status: DC | PRN
  Administered 2020-02-25: 80 mg via INTRADERMAL

## 2020-02-25 MED ORDER — CEFAZOLIN SODIUM-DEXTROSE 2-4 GM/100ML-% IV SOLN
2.0000 g | INTRAVENOUS | Status: AC
  Administered 2020-02-25: 07:00:00 2 g via INTRAVENOUS

## 2020-02-25 MED ORDER — OXYCODONE HCL 5 MG PO TABS: 5.0000 mg | ORAL_TABLET | Freq: Once | ORAL | Status: DC | PRN

## 2020-02-25 MED ORDER — CLONIDINE HCL (ANALGESIA) 100 MCG/ML EP SOLN
EPIDURAL | Status: DC | PRN
  Administered 2020-02-25: 67 ug
  Administered 2020-02-25: 33 ug

## 2020-02-25 MED ORDER — BUPIVACAINE-EPINEPHRINE (PF) 0.5% -1:200000 IJ SOLN
INTRAMUSCULAR | Status: AC
  Filled 2020-02-25: qty 30

## 2020-02-25 MED ORDER — OXYCODONE HCL 5 MG/5ML PO SOLN: 5.0000 mg | Freq: Once | ORAL | Status: DC | PRN

## 2020-02-25 MED ORDER — LACTATED RINGERS IV SOLN: INTRAVENOUS | Status: DC

## 2020-02-25 MED ORDER — BUPIVACAINE-EPINEPHRINE (PF) 0.5% -1:200000 IJ SOLN
INTRAMUSCULAR | Status: DC | PRN
  Administered 2020-02-25: 20 mL via PERINEURAL
  Administered 2020-02-25: 10 mL via PERINEURAL

## 2020-02-25 MED ORDER — FENTANYL CITRATE (PF) 100 MCG/2ML IJ SOLN
INTRAMUSCULAR | Status: DC | PRN
  Administered 2020-02-25: 25 ug via INTRAVENOUS
  Administered 2020-02-25: 50 ug via INTRAVENOUS

## 2020-02-25 SURGICAL SUPPLY — 86 items
BANDAGE ESMARK 6X9 LF (GAUZE/BANDAGES/DRESSINGS) IMPLANT
BLADE KM SAW (BLADE) ×3 IMPLANT
BLADE OSC (BLADE) IMPLANT
BLADE RECIP (BLADE) IMPLANT
BLADE RECIPRO TAPERED (BLADE) ×3 IMPLANT
BLADE SAW 8X12 (BLADE) ×1
BLADE SAW OSC ANKLE 8X63X1.19 (BLADE) ×2 IMPLANT
BLADE SAW RECIP ANKLE 8X50X1 (PIN) ×2 IMPLANT
BLADE SURG 15 STRL LF DISP TIS (BLADE) ×3 IMPLANT
BLADE SURG 15 STRL SS (BLADE) ×9
BNDG COHESIVE 4X5 TAN STRL (GAUZE/BANDAGES/DRESSINGS) ×3 IMPLANT
BNDG ELASTIC 4X5.8 VLCR STR LF (GAUZE/BANDAGES/DRESSINGS) ×3 IMPLANT
BNDG ELASTIC 6X5.8 VLCR STR LF (GAUZE/BANDAGES/DRESSINGS) IMPLANT
BNDG ESMARK 6X9 LF (GAUZE/BANDAGES/DRESSINGS)
CHLORAPREP W/TINT 26 (MISCELLANEOUS) ×3 IMPLANT
CLIP LOCKING ANKLE SZ3 (Clip) ×3 IMPLANT
COVER BACK TABLE 60X90IN (DRAPES) ×6 IMPLANT
COVER MAYO STAND STRL (DRAPES) ×3 IMPLANT
COVER WAND RF STERILE (DRAPES) IMPLANT
CUFF TOURN SGL QUICK 34 (TOURNIQUET CUFF) ×3
CUFF TRNQT CYL 34X4.125X (TOURNIQUET CUFF) ×1 IMPLANT
DECANTER SPIKE VIAL GLASS SM (MISCELLANEOUS) IMPLANT
DRAPE C-ARM 42X72 X-RAY (DRAPES) ×3 IMPLANT
DRAPE C-ARMOR (DRAPES) ×3 IMPLANT
DRAPE EXTREMITY T 121X128X90 (DISPOSABLE) ×3 IMPLANT
DRAPE U-SHAPE 47X51 STRL (DRAPES) ×3 IMPLANT
DRESSING MEPILEX FLEX 4X4 (GAUZE/BANDAGES/DRESSINGS) IMPLANT
DRSG MEPILEX BORDER 4X8 (GAUZE/BANDAGES/DRESSINGS) IMPLANT
DRSG MEPILEX FLEX 4X4 (GAUZE/BANDAGES/DRESSINGS)
DRSG MEPITEL 4X7.2 (GAUZE/BANDAGES/DRESSINGS) ×3 IMPLANT
DRSG PAD ABDOMINAL 8X10 ST (GAUZE/BANDAGES/DRESSINGS) ×6 IMPLANT
ELECT REM PT RETURN 9FT ADLT (ELECTROSURGICAL) ×3
ELECTRODE REM PT RTRN 9FT ADLT (ELECTROSURGICAL) ×1 IMPLANT
GAUZE SPONGE 4X4 12PLY STRL (GAUZE/BANDAGES/DRESSINGS) ×3 IMPLANT
GLOVE BIO SURGEON STRL SZ7 (GLOVE) ×3 IMPLANT
GLOVE BIO SURGEON STRL SZ8 (GLOVE) ×3 IMPLANT
GLOVE BIOGEL PI IND STRL 7.5 (GLOVE) ×1 IMPLANT
GLOVE BIOGEL PI INDICATOR 7.5 (GLOVE) ×2
GLOVE ECLIPSE 8.0 STRL XLNG CF (GLOVE) ×3 IMPLANT
GLOVE SRG 8 PF TXTR STRL LF DI (GLOVE) ×2 IMPLANT
GLOVE SURG UNDER POLY LF SZ8 (GLOVE) ×6
GOWN STRL REUS W/ TWL LRG LVL3 (GOWN DISPOSABLE) ×1 IMPLANT
GOWN STRL REUS W/ TWL XL LVL3 (GOWN DISPOSABLE) ×1 IMPLANT
GOWN STRL REUS W/TWL LRG LVL3 (GOWN DISPOSABLE) ×3
GOWN STRL REUS W/TWL XL LVL3 (GOWN DISPOSABLE) ×6 IMPLANT
IMPL TALAR PC VT SZ 3 LT (Ankle) ×1 IMPLANT
IMPLANT TALAR PC VT SZ 3 LT (Ankle) ×3 IMPLANT
INSERT TIB FB ANKLE SZ 3X7 LT (Ankle) ×3 IMPLANT
NEEDLE HYPO 22GX1.5 SAFETY (NEEDLE) IMPLANT
NS IRRIG 1000ML POUR BTL (IV SOLUTION) ×3 IMPLANT
PACK BASIN DAY SURGERY FS (CUSTOM PROCEDURE TRAY) ×3 IMPLANT
PAD CAST 4YDX4 CTTN HI CHSV (CAST SUPPLIES) ×2 IMPLANT
PADDING CAST ABS 4INX4YD NS (CAST SUPPLIES)
PADDING CAST ABS COTTON 4X4 ST (CAST SUPPLIES) IMPLANT
PADDING CAST COTTON 4X4 STRL (CAST SUPPLIES) ×6
PADDING CAST COTTON 6X4 STRL (CAST SUPPLIES) ×3 IMPLANT
PENCIL SMOKE EVACUATOR (MISCELLANEOUS) ×3 IMPLANT
PIN POUCH TALAR VANTAGE 2.0 (PIN) ×3 IMPLANT
PIN POUCH TALAR VANTAGE 2.5 (PIN) ×3 IMPLANT
PIN POUCH TALAR VANTAGE 3.5 (PIN) ×3 IMPLANT
PLATE TIB FB ANKLE SZ 3 LT (Ankle) ×3 IMPLANT
SANITIZER HAND PURELL 535ML FO (MISCELLANEOUS) ×3 IMPLANT
SAWBLADE RECIP ANKLE 8X50X1 (PIN) ×1
SCOTCHCAST PLUS 3X4 WHITE (CAST SUPPLIES) IMPLANT
SCOTCHCAST PLUS 4X4 WHITE (CAST SUPPLIES) ×9 IMPLANT
SHEET MEDIUM DRAPE 40X70 STRL (DRAPES) ×3 IMPLANT
SLEEVE SCD COMPRESS KNEE MED (MISCELLANEOUS) ×3 IMPLANT
SPONGE LAP 18X18 RF (DISPOSABLE) ×6 IMPLANT
STOCKINETTE 6  STRL (DRAPES) ×3
STOCKINETTE 6 STRL (DRAPES) ×1 IMPLANT
SUCTION FRAZIER HANDLE 10FR (MISCELLANEOUS)
SUCTION TUBE FRAZIER 10FR DISP (MISCELLANEOUS) IMPLANT
SURGILUBE 2OZ TUBE FLIPTOP (MISCELLANEOUS) ×3 IMPLANT
SUT ETHILON 3 0 PS 1 (SUTURE) ×6 IMPLANT
SUT MNCRL AB 3-0 PS2 18 (SUTURE) ×6 IMPLANT
SUT VIC AB 0 CT1 27 (SUTURE) ×3
SUT VIC AB 0 CT1 27XBRD ANBCTR (SUTURE) ×1 IMPLANT
SUT VIC AB 2-0 SH 27 (SUTURE)
SUT VIC AB 2-0 SH 27XBRD (SUTURE) IMPLANT
SYR 20ML LL LF (SYRINGE) IMPLANT
SYR BULB IRRIG 60ML STRL (SYRINGE) ×3 IMPLANT
TOWEL GREEN STERILE FF (TOWEL DISPOSABLE) ×6 IMPLANT
TUBE CONNECTING 20'X1/4 (TUBING) ×1
TUBE CONNECTING 20X1/4 (TUBING) ×2 IMPLANT
UNDERPAD 30X36 HEAVY ABSORB (UNDERPADS AND DIAPERS) ×3 IMPLANT
YANKAUER SUCT BULB TIP NO VENT (SUCTIONS) ×3 IMPLANT

## 2020-02-25 NOTE — Transfer of Care (Signed)
Immediate Anesthesia Transfer of Care Note  Patient: Kurt Kidd  Procedure(s) Performed: TOTAL ANKLE ARTHROPLASTY (Left Ankle)  Patient Location: PACU  Anesthesia Type:GA combined with regional for post-op pain  Level of Consciousness: drowsy  Airway & Oxygen Therapy: Patient Spontanous Breathing and Patient connected to face mask oxygen  Post-op Assessment: Report given to RN and Post -op Vital signs reviewed and stable  Post vital signs: Reviewed and stable  Last Vitals:  Vitals Value Taken Time  BP 138/68 02/25/20 0945  Temp    Pulse 77 02/25/20 0946  Resp 14 02/25/20 0946  SpO2 100 % 02/25/20 0946  Vitals shown include unvalidated device data.  Last Pain:  Vitals:   02/25/20 0656  TempSrc: Oral  PainSc: 0-No pain         Complications: No complications documented.

## 2020-02-25 NOTE — Anesthesia Postprocedure Evaluation (Signed)
Anesthesia Post Note  Patient: Kurt Kidd  Procedure(s) Performed: TOTAL ANKLE ARTHROPLASTY (Left Ankle)     Patient location during evaluation: PACU Anesthesia Type: General Level of consciousness: awake and alert and oriented Pain management: pain level controlled Vital Signs Assessment: post-procedure vital signs reviewed and stable Respiratory status: spontaneous breathing, nonlabored ventilation and respiratory function stable Cardiovascular status: blood pressure returned to baseline Postop Assessment: no apparent nausea or vomiting Anesthetic complications: no   No complications documented.  Last Vitals:  Vitals:   02/25/20 1000 02/25/20 1015  BP: 130/65 129/67  Pulse: 77 77  Resp: 15 18  Temp:  36.7 C  SpO2: 93% 94%    Last Pain:  Vitals:   02/25/20 1015  TempSrc:   PainSc: 0-No pain                 Brennan Bailey

## 2020-02-25 NOTE — H&P (Signed)
Kurt Kidd is an 69 y.o. male.   Chief Complaint: left ankle pain HPI: The patient is a 69 y/o male without significant PMH.  He injured his left ankle in his twenties and has developed post traumatic arthritis.  He has failed non op treatment including activity modification, oral nsaids, shoe wear modification and bracing.  He presents now for surgical treatment.  Past Medical History:  Diagnosis Date  . CAD (coronary artery disease)    DES.. 2004 /  catheterization 2008, 10% narrowing stent site, pain not cardiac  . Colitis, ischemic ([place])   . Colon polyp   . Degenerative joint disease   . Diabetes mellitus 2012  . Diverticulosis   . Dyslipidemia   . Ejection fraction    65-70%, echo, March, 2012  . GERD (gastroesophageal reflux disease)   . Hyperlipidemia   . Hypertension   . Mitral regurgitation    Echo, mild, 2012  . Murmur   . Onychomycosis   . Overweight(278.02)   . Palpitations    Rapid heartbeat and shortness of breath after eating 2008 recurrent episode 2012    Past Surgical History:  Procedure Laterality Date  . ANGIOPLASTY / STENTING FEMORAL  09/09/2002  . CARDIAC CATHETERIZATION    . COLONOSCOPY  2014   Dr Bary Castilla  . COLONOSCOPY  11/26/2007   Dr Patterson/POLYPOID COLONIC MUCOSA WITH HYPEREMIA AND  . COLONOSCOPY WITH PROPOFOL N/A 07/13/2016   Procedure: COLONOSCOPY WITH PROPOFOL;  Surgeon: Robert Bellow, MD;  Location: [facility] ENDOSCOPY;  Service: Endoscopy;  Laterality: N/A;  . ESOPHAGOGASTRODUODENOSCOPY (EGD) WITH PROPOFOL N/A 07/13/2016   Procedure: ESOPHAGOGASTRODUODENOSCOPY (EGD) WITH PROPOFOL;  Surgeon: Robert Bellow, MD;  Location: [facility] ENDOSCOPY;  Service: Endoscopy;  Laterality: N/A;  . UPPER GI ENDOSCOPY  11/26/2007   Dr Sharlett Iles    Family History  Problem Relation Age of Onset  . Heart failure Mother   . Heart disease Mother   . Diabetes Father        father also had ASHD/ CABG  . Heart disease Father   . Colon cancer Brother 5   . AAA (abdominal aortic aneurysm) Brother 58   Social History:  reports that he quit smoking about 29 years ago. His smoking use included cigarettes. He has a 26.00 pack-year smoking history. He has never used smokeless tobacco. He reports current alcohol use. He reports that he does not use drugs.  Allergies: No Known Allergies  Medications Prior to Admission  Medication Sig Dispense Refill  . aspirin 81 MG EC tablet Take 1 tablet (81 mg total) by mouth daily. 30 tablet 0  . atorvastatin (LIPITOR) 80 MG tablet Take 1 tablet (80 mg total) by mouth daily. 90 tablet 3  . Blood Glucose Monitoring Suppl (ONE TOUCH ULTRA SYSTEM KIT) W/DEVICE KIT 1 kit by Does not apply route once. 1 each 0  . diclofenac Sodium (VOLTAREN) 1 % GEL Apply a dab every 6 hours as needed to heel. (Patient taking differently: Apply 1 application topically every 6 (six) hours as needed (pain). Apply a dab every 6 hours as needed to heel.) 150 g 3  . esomeprazole (NEXIUM) 20 MG capsule Take 20 mg by mouth daily at 12 noon.    . ezetimibe (ZETIA) 10 MG tablet Take 1 tablet by mouth once daily 90 tablet 0  . metFORMIN (GLUCOPHAGE) 500 MG tablet Take 1 tablet (500 mg total) by mouth 3 (three) times daily. (Patient taking differently: Take 500 mg by mouth 2 (two) times  daily with a meal.) 270 tablet 2  . metoprolol tartrate (LOPRESSOR) 50 MG tablet Take 1 tablet (50 mg total) by mouth 2 (two) times daily. 180 tablet 3  . Multiple Vitamins-Minerals (MULTIVITAMIN WITH MINERALS) tablet Take 1 tablet by mouth daily.    . naproxen sodium (ALEVE) 220 MG tablet Take 220 mg by mouth daily as needed (pain).    . tamsulosin (FLOMAX) 0.4 MG CAPS capsule Take one daily (Patient taking differently: Take 0.4 mg by mouth daily. Take one daily) 90 capsule 3  . traMADol (ULTRAM) 50 MG tablet Take one at night as needed. (Patient taking differently: Take by mouth at bedtime as needed for severe pain. Take one at night as needed.) 30 tablet 2  .  nitroGLYCERIN (NITROSTAT) 0.4 MG SL tablet Place 1 tablet (0.4 mg total) under the tongue every 5 (five) minutes as needed. 25 tablet 1    Results for orders placed or performed during the hospital encounter of Mar 04, 2020 (from the past 48 hour(s))  Glucose, capillary     Status: Abnormal   Collection Time: 03/04/2020  6:46 AM  Result Value Ref Range   Glucose-Capillary 151 (H) 70 - 99 mg/dL    Comment: Glucose reference range applies only to samples taken after fasting for at least 8 hours.   No results found.  Review of Systems  No recent f/c/n/v/wt loss.  Blood pressure (!) 148/90, pulse 64, temperature 97.6 F (36.4 C), temperature source Oral, resp. rate 15, height 5' 6"  (1.676 m), weight 84.5 kg, SpO2 98 %. Physical Exam  wn wd male in nad.  A and O x 4.  Normal mood and affect.  EOMI.  resp unlabored.  L ankle with healthy skin.  heelcord is tight.  No lymphadenopathy.  2+ DP pulse.  Sens to LT intact around the ankle and foot.  Assessment/Plan L ankle post traumatic arthritis.  To OR today for L total ankle replacement.  The risks and benefits of the alternative treatment options have been discussed in detail.  The patient wishes to proceed with surgery and specifically understands risks of bleeding, infection, nerve damage, blood clots, need for additional surgery, amputation and death.   Wylene Simmer, MD 03/04/2020, 7:19 AM

## 2020-02-25 NOTE — Anesthesia Procedure Notes (Signed)
Procedure Name: LMA Insertion Date/Time: 02/25/2020 7:43 AM Performed by: Garrel Ridgel, CRNA Pre-anesthesia Checklist: Patient identified, Emergency Drugs available, Suction available and Patient being monitored Patient Re-evaluated:Patient Re-evaluated prior to induction Oxygen Delivery Method: Circle system utilized Preoxygenation: Pre-oxygenation with 100% oxygen Induction Type: IV induction Ventilation: Mask ventilation without difficulty LMA: LMA inserted LMA Size: 5.0 Number of attempts: 2 Placement Confirmation: positive ETCO2 Tube secured with: Tape

## 2020-02-25 NOTE — Progress Notes (Signed)
Assisted Dr. Howze with left, ultrasound guided, popliteal, adductor canal block. Side rails up, monitors on throughout procedure. See vital signs in flow sheet. Tolerated Procedure well. 

## 2020-02-25 NOTE — Discharge Instructions (Addendum)
Wylene Simmer, MD [facility]  Please read the following information regarding your care after surgery.  Medications  You only need a prescription for the narcotic pain medicine (ex. oxycodone, Percocet, Norco).  All of the other medicines listed below are available over the counter. X Aleve 2 pills twice a day for the first 3 days after surgery. X acetominophen (Tylenol) 650 mg every 4-6 hours as you need for minor to moderate pain X oxycodone as prescribed for severe pain X xarelto as prescribed to prevent blood clots  Narcotic pain medicine (ex. oxycodone, Percocet, Vicodin) will cause constipation.  To prevent this problem, take the following medicines while you are taking any pain medicine. X docusate sodium (Colace) 100 mg twice a day X senna (Senokot) 2 tablets twice a day  X To help prevent blood clots, take Xarelto as prescribed for two weeks after surgery.  After you finish the xarelto prescription please resume your baby aspirin that you generally take on a daily basis.  You should also get up every hour while you are awake to move around.    Weight Bearing X Do not bear any weight on the operated leg or foot.  Cast / Splint / Dressing X Keep your splint, cast or dressing clean and dry.  Dont put anything (coat hanger, pencil, etc) down inside of it.  If it gets damp, use a hair dryer on the cool setting to dry it.  If it gets soaked, call the office to schedule an appointment for a cast change.   After your dressing, cast or splint is removed; you may shower, but do not soak or scrub the wound.  Allow the water to run over it, and then gently pat it dry.  Swelling It is normal for you to have swelling where you had surgery.  To reduce swelling and pain, keep your toes above your nose for at least 3 days after surgery.  It may be necessary to keep your foot or leg elevated for several weeks.  If it hurts, it should be elevated.  Follow Up Call my office at [phone] when  you are discharged from the hospital or surgery center to schedule an appointment to be seen two weeks after surgery.  Call my office at [phone] if you develop a fever >101.5 F, nausea, vomiting, bleeding from the surgical site or severe pain.     Post Anesthesia Home Care Instructions  Activity: Get plenty of rest for the remainder of the day. A responsible individual must stay with you for 24 hours following the procedure.  For the next 24 hours, DO NOT: -Drive a car -Paediatric nurse -Drink alcoholic beverages -Take any medication unless instructed by your physician -Make any legal decisions or sign important papers.  Meals: Start with liquid foods such as gelatin or soup. Progress to regular foods as tolerated. Avoid greasy, spicy, heavy foods. If nausea and/or vomiting occur, drink only clear liquids until the nausea and/or vomiting subsides. Call your physician if vomiting continues.  Special Instructions/Symptoms: Your throat may feel dry or sore from the anesthesia or the breathing tube placed in your throat during surgery. If this causes discomfort, gargle with warm salt water. The discomfort should disappear within 24 hours.  If you had a scopolamine patch placed behind your ear for the management of post- operative nausea and/or vomiting:  1. The medication in the patch is effective for 72 hours, after which it should be removed.  Wrap patch in a tissue and discard  in the trash. Wash hands thoroughly with soap and water. 2. You may remove the patch earlier than 72 hours if you experience unpleasant side effects which may include dry mouth, dizziness or visual disturbances. 3. Avoid touching the patch. Wash your hands with soap and water after contact with the patch.    Regional Anesthesia Blocks  1. Numbness or the inability to move the "blocked" extremity may last from 3-48 hours after placement. The length of time depends on the medication injected and your  individual response to the medication. If the numbness is not going away after 48 hours, call your surgeon.  2. The extremity that is blocked will need to be protected until the numbness is gone and the  Strength has returned. Because you cannot feel it, you will need to take extra care to avoid injury. Because it may be weak, you may have difficulty moving it or using it. You may not know what position it is in without looking at it while the block is in effect.  3. For blocks in the legs and feet, returning to weight bearing and walking needs to be done carefully. You will need to wait until the numbness is entirely gone and the strength has returned. You should be able to move your leg and foot normally before you try and bear weight or walk. You will need someone to be with you when you first try to ensure you do not fall and possibly risk injury.  4. Bruising and tenderness at the needle site are common side effects and will resolve in a few days.  5. Persistent numbness or new problems with movement should be communicated to the surgeon or the [place] [phone] [place] [phone]).  No Tylenol until 1:05 PM.

## 2020-02-25 NOTE — Anesthesia Procedure Notes (Signed)
Anesthesia Regional Block: Popliteal block   Pre-Anesthetic Checklist: ,, timeout performed, Correct Patient, Correct Site, Correct Laterality, Correct Procedure, Correct Position, site marked, Risks and benefits discussed, pre-op evaluation,  At surgeon's request and post-op pain management  Laterality: Left  Prep: Maximum Sterile Barrier Precautions used, chloraprep       Needles:  Injection technique: Single-shot  Needle Type: Echogenic Stimulator Needle     Needle Length: 9cm  Needle Gauge: 22     Additional Needles:   Procedures:,,,, ultrasound used (permanent image in chart),,,,  Narrative:  Start time: 02/25/2020 7:13 AM End time: 02/25/2020 7:15 AM Injection made incrementally with aspirations every 5 mL.  Performed by: Personally  Anesthesiologist: Brennan Bailey, MD  Additional Notes: Risks, benefits, and alternative discussed. Patient gave consent for procedure. Patient prepped and draped in sterile fashion. Sedation administered, patient remains easily responsive to voice. Relevant anatomy identified with ultrasound guidance. Local anesthetic given in 5cc increments with no signs or symptoms of intravascular injection. No pain or paraesthesias with injection. Patient monitored throughout procedure with signs of LAST or immediate complications. Tolerated well. Ultrasound image placed in chart.  Tawny Asal, MD

## 2020-02-25 NOTE — Op Note (Signed)
02/25/2020  9:44 AM  PATIENT:  Kurt Kidd  69 y.o. male  PRE-OPERATIVE DIAGNOSIS: Posttraumatic arthritis of the left ankle  POST-OPERATIVE DIAGNOSIS: 1.  Posttraumatic arthritis of the left ankle 2.  Short Achilles tendon on the left  Procedure(s): 1.  Left total ankle replacement 2.  Percutaneous left Achilles tendon lengthening  SURGEON:  Toni Arthurs, MD  ASSISTANT: Alfredo Martinez, PA-C  ANESTHESIA:   General, regional  EBL:  minimal   TOURNIQUET:   Total Tourniquet Time Documented: Thigh (Left) - 92 minutes Total: Thigh (Left) - 92 minutes  COMPLICATIONS:  None apparent  DISPOSITION:  Extubated, awake and stable to recovery.  INDICATION FOR PROCEDURE: The patient is a 69 year old male who has a history of left ankle fracture in his 20s.  He has posttraumatic arthritis of the ankle and has failed nonoperative treatment to date.  He presents today for a left total ankle replacement.  The risks and benefits of the alternative treatment options have been discussed in detail.  The patient wishes to proceed with surgery and specifically understands risks of bleeding, infection, nerve damage, blood clots, need for additional surgery, amputation and death.  PROCEDURE IN DETAIL:  After pre operative consent was obtained, and the correct operative site was identified, the patient was brought to the operating room and placed supine on the OR table.  Anesthesia was administered.  Pre-operative antibiotics were administered.  A surgical timeout was taken.  The left lower extremity was prepped and draped in standard sterile fashion with a tourniquet around the thigh.  The extremity was elevated and the tourniquet was inflated to 250 mmHg.  A longitudinal incision was then made over the anterior ankle in line with the extensor houses longus tendon.  Dissection was carried down through the subcutaneous tissues taking care to protect branches of the superficial peroneal nerve.  The extensor  retinaculum was incised over the EHL and released proximally and distally.  The interval between the EHL and the tibialis anterior was then developed.  The neurovascular bundle was identified.  It was mobilized and retracted laterally.  It was protected throughout the case.  The anterior joint capsule was elevated medially and laterally exposing the ankle joint.  Large osteophytes were evident at the distal tibia and talar neck.  These were resected with an osteotome and rondure.  A pin was inserted percutaneously at the tibial tubercle in line with the medial gutter sword.  The external alignment guide was then attached to the proximal pin and aligned with the joint distally.  An AP radiograph confirmed appropriate alignment.  Rotation of the cutting guide was set in line with the medial gutter.  The guide was then provisionally pinned distally.  Appropriate varus valgus alignment was noted.  The cutting block was then pinned.  A lateral view was obtained confirming appropriate slope.  The resection height was set 7 mm proximal from the tibial plafond.  The distal cutting block was pinned.  The oscillating saw was then used to make the distal tibial cut taking care to protect the posterior soft tissues.  The vertical cut was made with the reciprocating saw.  The block and pins were removed.  The cut bone was removed with a curved osteotome.  The talar cutting guide was then positioned and a lateral radiograph used to confirm the appropriate position.  The guide was pinned into place.  A 4 mm talar cut was made.  Bone was resected.  A size 3 trial was inserted and  was noted to fit appropriately.  The talus was then sized and was also a size 3.  The size 3 lollipop was pinned in position after radiographs confirmed appropriate position.  The lollipop was exchanged for the cutting guide.  The talar cuts were then made and all cut bone removed.  The rasp was used to smooth the cut surfaces.  The wound was irrigated  copiously.  The distal tibial cut was measured and was noted to be a size 3 as well.  The talar trial was inserted and was noted to fit appropriately.  Screw was inserted holding it in place.  The tibial trial was inserted with a punch liner.  AP and lateral radiographs confirmed appropriate position of the tibial trial.  It was then pinned in position.  The talar lug holes were drilled and the talar implant removed.  The peripheral peg holes in the tibia were punched followed by the central peg hole.  The wound was then irrigated copiously.  The size 3 tibial implant was sprinkled with vancomycin powder and inserted.  It was impacted into position.  Radiographs confirmed appropriate position of the implant.  The protective liner was inserted to the tibial component.  The talar component was then sprinkled with vancomycin powder and inserted.  It was impacted into position.  A radiograph confirmed appropriate position.  A size 7 mm trial polyspacer was inserted.  The ankle was noted to lack a few degrees of dorsiflexion to neutral.  The heel cord was noted to be quite tight.  Medial and lateral balance was appropriate.  Percutaneous heel cord lengthening was performed with a 15 blade.  This allowed the ankle to dorsiflex appropriately.  The trial polywas removed.  The wound was irrigated.  The final 7 mm polyspacer was inserted.  The locking clip was inserted and was seated appropriately.  Final AP, mortise and lateral radiographs confirmed appropriate position of all hardware.  The wound was again irrigated copiously.  Vancomycin powder was sprinkled in the deep portion of the wound.  The anterior joint capsule was repaired with figure-of-eight sutures of 0 Vicryl.  The extensor retinaculum was then repaired with figure-of-eight and running sutures of 0 Vicryl.  Subcutaneous tissues were approximated with Monocryl.  Skin incisions were closed with nylon.  The proximal guidepin was removed and the wound closed with  nylon after irrigating.  Sterile dressings were applied followed by a well-padded short leg cast.  The tourniquet was released after application of the dressings.  The patient was awakened from anesthesia and transported to the recovery room in stable condition.   FOLLOW UP PLAN: Nonweightbearing on the left lower extremity in a short leg cast.  Follow-up in the office in 3 weeks for suture removal and conversion to a cam boot to initiate range of motion and weightbearing.  Xarelto for DVT prophylaxis.   Mechele Claude PA-C was present and scrubbed for the duration of the operative case. His assistance was essential in positioning the patient, prepping and draping, gaining and maintaining exposure, performing the operation, closing and dressing the wounds and applying the splint.

## 2020-02-25 NOTE — Anesthesia Procedure Notes (Signed)
Anesthesia Regional Block: Adductor canal block   Pre-Anesthetic Checklist: ,, timeout performed, Correct Patient, Correct Site, Correct Laterality, Correct Procedure, Correct Position, site marked, Risks and benefits discussed, pre-op evaluation,  At surgeon's request and post-op pain management  Laterality: Left  Prep: Maximum Sterile Barrier Precautions used, chloraprep       Needles:  Injection technique: Single-shot  Needle Type: Echogenic Stimulator Needle     Needle Length: 9cm  Needle Gauge: 22     Additional Needles:   Procedures:,,,, ultrasound used (permanent image in chart),,,,  Narrative:  Start time: 02/25/2020 7:11 AM End time: 02/25/2020 7:13 AM Injection made incrementally with aspirations every 5 mL.  Performed by: Personally  Anesthesiologist: Brennan Bailey, MD  Additional Notes: Risks, benefits, and alternative discussed. Patient gave consent for procedure. Patient prepped and draped in sterile fashion. Sedation administered, patient remains easily responsive to voice. Relevant anatomy identified with ultrasound guidance. Local anesthetic given in 5cc increments with no signs or symptoms of intravascular injection. No pain or paraesthesias with injection. Patient monitored throughout procedure with signs of LAST or immediate complications. Tolerated well. Ultrasound image placed in chart.  Tawny Asal, MD

## 2020-03-01 ENCOUNTER — Encounter (HOSPITAL_BASED_OUTPATIENT_CLINIC_OR_DEPARTMENT_OTHER): Payer: Self-pay | Admitting: Orthopedic Surgery

## 2020-03-16 DIAGNOSIS — M12572 Traumatic arthropathy, left ankle and foot: Secondary | ICD-10-CM | POA: Diagnosis not present

## 2020-04-06 DIAGNOSIS — M25572 Pain in left ankle and joints of left foot: Secondary | ICD-10-CM | POA: Diagnosis not present

## 2020-04-18 DIAGNOSIS — M25572 Pain in left ankle and joints of left foot: Secondary | ICD-10-CM | POA: Diagnosis not present

## 2020-04-24 ENCOUNTER — Other Ambulatory Visit: Payer: Self-pay | Admitting: Family Medicine

## 2020-04-24 DIAGNOSIS — I6523 Occlusion and stenosis of bilateral carotid arteries: Secondary | ICD-10-CM

## 2020-04-24 DIAGNOSIS — E782 Mixed hyperlipidemia: Secondary | ICD-10-CM

## 2020-04-25 DIAGNOSIS — M25572 Pain in left ankle and joints of left foot: Secondary | ICD-10-CM | POA: Diagnosis not present

## 2020-04-25 NOTE — Telephone Encounter (Signed)
Last OV 11/23/19 Last fill for Zetia 11/14/19  #90/0 Last fill for Metformin 01/22/19  #270/2

## 2020-04-26 ENCOUNTER — Other Ambulatory Visit: Payer: Self-pay

## 2020-04-26 ENCOUNTER — Other Ambulatory Visit (HOSPITAL_COMMUNITY): Payer: Self-pay | Admitting: Internal Medicine

## 2020-04-26 ENCOUNTER — Ambulatory Visit (HOSPITAL_COMMUNITY)
Admission: RE | Admit: 2020-04-26 | Discharge: 2020-04-26 | Disposition: A | Discharge status: Home / Self Care | Payer: Medicare Other | Source: Ambulatory Visit | Attending: Cardiovascular Disease | Admitting: Cardiovascular Disease

## 2020-04-26 DIAGNOSIS — I6523 Occlusion and stenosis of bilateral carotid arteries: Secondary | ICD-10-CM

## 2020-05-02 DIAGNOSIS — M25572 Pain in left ankle and joints of left foot: Secondary | ICD-10-CM | POA: Diagnosis not present

## 2020-05-09 DIAGNOSIS — M12572 Traumatic arthropathy, left ankle and foot: Secondary | ICD-10-CM | POA: Diagnosis not present

## 2020-05-16 DIAGNOSIS — M25572 Pain in left ankle and joints of left foot: Secondary | ICD-10-CM | POA: Diagnosis not present

## 2020-07-07 ENCOUNTER — Other Ambulatory Visit: Payer: Self-pay | Admitting: Family Medicine

## 2020-07-07 DIAGNOSIS — G8929 Other chronic pain: Secondary | ICD-10-CM

## 2020-08-10 ENCOUNTER — Other Ambulatory Visit: Payer: Self-pay | Admitting: Family Medicine

## 2020-08-10 DIAGNOSIS — E782 Mixed hyperlipidemia: Secondary | ICD-10-CM

## 2020-08-10 DIAGNOSIS — I6523 Occlusion and stenosis of bilateral carotid arteries: Secondary | ICD-10-CM

## 2020-09-18 ENCOUNTER — Other Ambulatory Visit: Payer: Self-pay | Admitting: Family Medicine

## 2020-09-30 ENCOUNTER — Telehealth: Payer: Self-pay | Admitting: Family Medicine

## 2020-09-30 DIAGNOSIS — J029 Acute pharyngitis, unspecified: Secondary | ICD-10-CM | POA: Diagnosis not present

## 2020-09-30 DIAGNOSIS — R051 Acute cough: Secondary | ICD-10-CM | POA: Diagnosis not present

## 2020-09-30 DIAGNOSIS — M791 Myalgia, unspecified site: Secondary | ICD-10-CM | POA: Diagnosis not present

## 2020-09-30 NOTE — Telephone Encounter (Signed)
Pt tested positive for covid and wife wanted to schedule appt but we didn't have anything available, I recommended either an evisit through mychart or an urgent care

## 2020-10-03 NOTE — Telephone Encounter (Signed)
Returned call no answer LMTCB 

## 2020-10-05 NOTE — Telephone Encounter (Signed)
Called patient to check and see how he was feeling with positive covid. Per patient no more symptoms. Appointment scheduled for routine follow up.

## 2020-10-12 ENCOUNTER — Ambulatory Visit: Payer: Medicare Other

## 2020-10-17 ENCOUNTER — Encounter: Payer: Self-pay | Admitting: Family Medicine

## 2020-10-17 ENCOUNTER — Ambulatory Visit (INDEPENDENT_AMBULATORY_CARE_PROVIDER_SITE_OTHER): Payer: Medicare Other | Admitting: Family Medicine

## 2020-10-17 ENCOUNTER — Other Ambulatory Visit: Payer: Self-pay

## 2020-10-17 VITALS — BP 136/70 | HR 69 | Temp 97.3°F | Ht 66.0 in | Wt 184.2 lb

## 2020-10-17 DIAGNOSIS — M19042 Primary osteoarthritis, left hand: Secondary | ICD-10-CM

## 2020-10-17 DIAGNOSIS — M19041 Primary osteoarthritis, right hand: Secondary | ICD-10-CM | POA: Diagnosis not present

## 2020-10-17 DIAGNOSIS — R3911 Hesitancy of micturition: Secondary | ICD-10-CM | POA: Diagnosis not present

## 2020-10-17 DIAGNOSIS — N401 Enlarged prostate with lower urinary tract symptoms: Secondary | ICD-10-CM | POA: Diagnosis not present

## 2020-10-17 DIAGNOSIS — E119 Type 2 diabetes mellitus without complications: Secondary | ICD-10-CM | POA: Diagnosis not present

## 2020-10-17 DIAGNOSIS — I10 Essential (primary) hypertension: Secondary | ICD-10-CM

## 2020-10-17 DIAGNOSIS — B002 Herpesviral gingivostomatitis and pharyngotonsillitis: Secondary | ICD-10-CM

## 2020-10-17 DIAGNOSIS — E782 Mixed hyperlipidemia: Secondary | ICD-10-CM | POA: Diagnosis not present

## 2020-10-17 LAB — COMPREHENSIVE METABOLIC PANEL
ALT: 26 U/L (ref 0–53)
AST: 21 U/L (ref 0–37)
Albumin: 4.4 g/dL (ref 3.5–5.2)
Alkaline Phosphatase: 82 U/L (ref 39–117)
BUN: 11 mg/dL (ref 6–23)
CO2: 29 mEq/L (ref 19–32)
Calcium: 9.6 mg/dL (ref 8.4–10.5)
Chloride: 102 mEq/L (ref 96–112)
Creatinine, Ser: 0.65 mg/dL (ref 0.40–1.50)
GFR: 94.53 mL/min (ref >60.00)
Glucose, Bld: 124 mg/dL — ABNORMAL HIGH (ref 70–99)
Potassium: 4.1 mEq/L (ref 3.5–5.1)
Sodium: 139 mEq/L (ref 135–145)
Total Bilirubin: 0.7 mg/dL (ref 0.2–1.2)
Total Protein: 6.5 g/dL (ref 6.0–8.3)

## 2020-10-17 LAB — MICROALBUMIN / CREATININE URINE RATIO
Creatinine,U: 125.9 mg/dL
Microalb Creat Ratio: 3.2 mg/g (ref 0.0–30.0)
Microalb, Ur: 4 mg/dL — ABNORMAL HIGH (ref 0.0–1.9)

## 2020-10-17 LAB — URINALYSIS, ROUTINE W REFLEX MICROSCOPIC
Bilirubin Urine: NEGATIVE
Hgb urine dipstick: NEGATIVE
Ketones, ur: NEGATIVE
Leukocytes,Ua: NEGATIVE
Nitrite: NEGATIVE
RBC / HPF: NONE SEEN (ref 0–?)
Specific Gravity, Urine: 1.02 (ref 1.000–1.030)
Total Protein, Urine: NEGATIVE
Urine Glucose: NEGATIVE
Urobilinogen, UA: 0.2 (ref 0.0–1.0)
pH: 6 (ref 5.0–8.0)

## 2020-10-17 LAB — CBC
HCT: 39.5 % (ref 39.0–52.0)
Hemoglobin: 13.6 g/dL (ref 13.0–17.0)
MCHC: 34.4 g/dL (ref 30.0–36.0)
MCV: 85.9 fl (ref 78.0–100.0)
Platelets: 268 10*3/uL (ref 150.0–400.0)
RBC: 4.6 Mil/uL (ref 4.22–5.81)
RDW: 13.9 % (ref 11.5–15.5)
WBC: 7.1 10*3/uL (ref 4.0–10.5)

## 2020-10-17 LAB — LIPID PANEL
Cholesterol: 149 mg/dL (ref 0–200)
HDL: 54.2 mg/dL (ref >39.00)
LDL Cholesterol: 74 mg/dL (ref 0–99)
NonHDL: 95.24
Total CHOL/HDL Ratio: 3
Triglycerides: 104 mg/dL (ref 0.0–149.0)
VLDL: 20.8 mg/dL (ref 0.0–40.0)

## 2020-10-17 LAB — LDL CHOLESTEROL, DIRECT: Direct LDL: 82 mg/dL

## 2020-10-17 LAB — PSA: PSA: 2.57 ng/mL (ref 0.10–4.00)

## 2020-10-17 NOTE — Progress Notes (Addendum)
Established Patient Office Visit  Subjective:  Patient ID: Kurt Kidd, male    DOB: 1950-06-21  Age: 70 y.o. MRN: [id number]  CC:  Chief Complaint  Patient presents with   Follow-up    Routine follow up on BP and DM, no concerns patient fasting for labs.     HPI Kurt Kidd presents for follow-up of hypertension, diabetes, BPH, hyperlipidemia with carotid artery stenosis and coronary artery disease and arthritis of pends.  Blood pressure has been well controlled with metoprolol.  Diabetes controlled with metformin.  Status post eye check in October.  Lipids are controlled with Setia and high-dose atorvastatin.  Status post left total ankle replacement.  Far less pain.  He does have chronic pain and swelling in the joints of his fingers.  Continues to work hard at his [employer].  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  Urine flow is okay with tamsulosin.  Past Medical History:  Diagnosis Date   CAD (coronary artery disease)    DES.. 2004 /  catheterization 2008, 10% narrowing stent site, pain not cardiac   Colitis, ischemic ([place])    Colon polyp    Degenerative joint disease    Diabetes mellitus 2012   Diverticulosis    Dyslipidemia    Ejection fraction    65-70%, echo, March, 2012   GERD (gastroesophageal reflux disease)    Hyperlipidemia    Hypertension    Mitral regurgitation    Echo, mild, 2012   Murmur    Onychomycosis    Overweight(278.02)    Palpitations    Rapid heartbeat and shortness of breath after eating 2008 recurrent episode 2012    Past Surgical History:  Procedure Laterality Date   ANGIOPLASTY / STENTING FEMORAL  09/09/2002   CARDIAC CATHETERIZATION     COLONOSCOPY  2014   Dr Bary Castilla   COLONOSCOPY  11/26/2007   Dr Patterson/POLYPOID COLONIC MUCOSA WITH HYPEREMIA AND   COLONOSCOPY WITH PROPOFOL N/A 07/13/2016   Procedure: COLONOSCOPY WITH PROPOFOL;  Surgeon: Robert Bellow, MD;  Location: [facility];   Service: Endoscopy;  Laterality: N/A;   ESOPHAGOGASTRODUODENOSCOPY (EGD) WITH PROPOFOL N/A 07/13/2016   Procedure: ESOPHAGOGASTRODUODENOSCOPY (EGD) WITH PROPOFOL;  Surgeon: Robert Bellow, MD;  Location: [facility];  Service: Endoscopy;  Laterality: N/A;   TOTAL ANKLE ARTHROPLASTY Left 02/25/2020   Procedure: TOTAL ANKLE ARTHROPLASTY;  Surgeon: Wylene Simmer, MD;  Location: [place];  Service: Orthopedics;  Laterality: Left;   UPPER GI ENDOSCOPY  11/26/2007   Dr Sharlett Iles    Family History  Problem Relation Age of Onset   Heart failure Mother    Heart disease Mother    Diabetes Father        father also had ASHD/ CABG   Heart disease Father    Colon cancer Brother 62   AAA (abdominal aortic aneurysm) Brother 49    Social History   Socioeconomic History   Marital status: Married    Spouse name: Not on file   Number of children: Not on file   Years of education: Not on file   Highest education level: Not on file  Occupational History   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    Employer: Manufacturing engineer  Tobacco Use   Smoking status: Former    Packs/day: 1.00    Years: 26.00    Pack years: 26.00    Types: Cigarettes    Quit date: 03/05/1990    Years  since quitting: 30.6   Smokeless tobacco: Never  Substance and Sexual Activity   Alcohol use: Yes    Alcohol/week: 0.0 standard drinks    Comment: 2 drinks per day   Drug use: No   Sexual activity: Not on file  Other Topics Concern   Not on file  Social History Narrative   Not on file   Social Determinants of Health   Financial Resource Strain: Not on file  Food Insecurity: Not on file  Transportation Needs: Not on file  Physical Activity: Not on file  Stress: Not on file  Social Connections: Not on file  Intimate Partner Violence: Not on file    Outpatient Medications Prior to Visit  Medication Sig Dispense Refill   atorvastatin (LIPITOR) 80 MG tablet Take 1 tablet (80 mg total) by mouth  daily. 90 tablet 3   Blood Glucose Monitoring Suppl (ONE TOUCH ULTRA SYSTEM KIT) W/DEVICE KIT 1 kit by Does not apply route once. 1 each 0   docusate sodium (COLACE) 100 MG capsule Take 1 capsule (100 mg total) by mouth 2 (two) times daily. While taking narcotic pain medicine. 30 capsule 0   esomeprazole (NEXIUM) 20 MG capsule Take 20 mg by mouth daily at 12 noon.     ezetimibe (ZETIA) 10 MG tablet Take 1 tablet by mouth once daily 90 tablet 0   metFORMIN (GLUCOPHAGE) 500 MG tablet TAKE 1 TABLET BY MOUTH THREE TIMES DAILY 270 tablet 0   metoprolol tartrate (LOPRESSOR) 50 MG tablet Take 1 tablet (50 mg total) by mouth 2 (two) times daily. 180 tablet 3   Multiple Vitamins-Minerals (MULTIVITAMIN WITH MINERALS) tablet Take 1 tablet by mouth daily.     naproxen sodium (ALEVE) 220 MG tablet Take 220 mg by mouth daily as needed (pain).     nitroGLYCERIN (NITROSTAT) 0.4 MG SL tablet Place 1 tablet (0.4 mg total) under the tongue every 5 (five) minutes as needed. 25 tablet 1   rivaroxaban (XARELTO) 10 MG TABS tablet Take 1 tablet (10 mg total) by mouth daily. 14 tablet 0   senna (SENOKOT) 8.6 MG TABS tablet Take 2 tablets (17.2 mg total) by mouth 2 (two) times daily. 30 tablet 0   tamsulosin (FLOMAX) 0.4 MG CAPS capsule Take one daily (Patient taking differently: Take 0.4 mg by mouth daily. Take one daily) 90 capsule 3   traMADol (ULTRAM) 50 MG tablet TAKE 1 TABLET BY MOUTH AT NIGHT AS NEEDED (Patient not taking: Reported on 10/17/2020) 30 tablet 0   No facility-administered medications prior to visit.    No Known Allergies  ROS Review of Systems  Constitutional: Negative.   HENT: Negative.    Eyes:  Negative for photophobia and visual disturbance.  Respiratory:  Negative for chest tightness and shortness of breath.   Cardiovascular:  Negative for chest pain.  Gastrointestinal: Negative.   Endocrine: Negative for polyphagia and polyuria.  Genitourinary:  Positive for difficulty urinating. Negative  for frequency and urgency.  Musculoskeletal:  Positive for arthralgias.  Skin:  Negative for color change and pallor.  Neurological:  Negative for speech difficulty and weakness.  Hematological:  Does not bruise/bleed easily.  Psychiatric/Behavioral: Negative.       Objective:    Physical Exam Vitals and nursing note reviewed.  Constitutional:      General: He is not in acute distress.    Appearance: Normal appearance. He is normal weight. He is not ill-appearing, toxic-appearing or diaphoretic.  HENT:     Head: Normocephalic and  atraumatic.     Right Ear: Tympanic membrane, ear canal and external ear normal.     Left Ear: Tympanic membrane, ear canal and external ear normal.     Mouth/Throat:     Mouth: Mucous membranes are moist.     Pharynx: Oropharynx is clear. No oropharyngeal exudate or posterior oropharyngeal erythema.  Eyes:     General: No scleral icterus.       Right eye: No discharge.        Left eye: No discharge.     Extraocular Movements: Extraocular movements intact.     Conjunctiva/sclera: Conjunctivae normal.     Pupils: Pupils are equal, round, and reactive to light.  Neck:     Vascular: Carotid bruit present.  Cardiovascular:     Rate and Rhythm: Normal rate and regular rhythm.  Pulmonary:     Effort: Pulmonary effort is normal.     Breath sounds: Normal breath sounds.  Abdominal:     General: Bowel sounds are normal.  Musculoskeletal:     Cervical back: No rigidity or tenderness.  Lymphadenopathy:     Cervical: No cervical adenopathy.  Skin:    General: Skin is warm and dry.  Neurological:     Mental Status: He is alert and oriented to person, place, and time.   Diabetic Foot Exam - Simple   Simple Foot Form Visual Inspection See comments: Yes Sensation Testing Intact to touch and monofilament testing bilaterally: Yes Pulse Check See comments: Yes Comments Feet are cavus bilaterally.  Left ankle status post replacement flexion and extension  are somewhat limited.  Well-healed anterior scar.  Capillary refill is brisk bilaterally.     BP 136/70 (BP Location: Right Arm, Patient Position: Sitting, Cuff Size: Normal)   Pulse 69   Temp (!) 97.3 F (36.3 C) (Temporal)   Ht 5' 6"  (1.676 m)   Wt 184 lb 3.2 oz (83.6 kg)   SpO2 96%   BMI 29.73 kg/m  Wt Readings from Last 3 Encounters:  10/17/20 184 lb 3.2 oz (83.6 kg)  02/25/20 186 lb 4.6 oz (84.5 kg)  01/19/20 188 lb (85.3 kg)     Health Maintenance Due  Topic Date Due   FOOT EXAM  Never done   Hepatitis C Screening  Never done   Zoster Vaccines- Shingrix (1 of 2) Never done   PNA vac Low Risk Adult (2 of 2 - PPSV23) 05/28/2018   OPHTHALMOLOGY EXAM  12/15/2019   HEMOGLOBIN A1C  05/22/2020   INFLUENZA VACCINE  10/03/2020   URINE MICROALBUMIN  11/22/2020    There are no preventive care reminders to display for this patient.  Lab Results  Component Value Date   TSH 1.95 05/27/2017   Lab Results  Component Value Date   WBC 6.7 11/23/2019   HGB 13.9 11/23/2019   HCT 40.9 11/23/2019   MCV 88.0 11/23/2019   PLT 303.0 11/23/2019   Lab Results  Component Value Date   NA 137 02/22/2020   K 5.1 02/22/2020   CO2 27 02/22/2020   GLUCOSE 163 (H) 02/22/2020   BUN 9 02/22/2020   CREATININE 0.63 02/22/2020   BILITOT 0.6 11/23/2019   ALKPHOS 71 11/23/2019   AST 33 11/23/2019   ALT 47 11/23/2019   PROT 6.6 11/23/2019   ALBUMIN 4.5 11/23/2019   CALCIUM 9.5 02/22/2020   ANIONGAP 10 02/22/2020   GFR 116.93 11/23/2019   Lab Results  Component Value Date   CHOL 138 11/23/2019   Lab Results  Component Value Date   HDL 53.20 11/23/2019   Lab Results  Component Value Date   LDLCALC 66 11/23/2019   Lab Results  Component Value Date   TRIG 98.0 11/23/2019   Lab Results  Component Value Date   CHOLHDL 3 11/23/2019   Lab Results  Component Value Date   HGBA1C 7.3 (H) 11/23/2019      Assessment & Plan:   Problem List Items Addressed This Visit        Cardiovascular and Mediastinum   Essential hypertension - Primary   Relevant Orders   CBC   Comprehensive metabolic panel   Urinalysis, Routine w reflex microscopic   Microalbumin / creatinine urine ratio     Endocrine   Diabetes mellitus type 2, noninsulin dependent (HCC)   Relevant Orders   Comprehensive metabolic panel   Urinalysis, Routine w reflex microscopic   Microalbumin / creatinine urine ratio     Musculoskeletal and Integument   Arthritis of both hands     Genitourinary   Benign prostatic hyperplasia with urinary hesitancy   Relevant Orders   PSA   Urinalysis, Routine w reflex microscopic     Other   Mixed hyperlipidemia   Relevant Orders   LDL cholesterol, direct   Lipid panel    No orders of the defined types were placed in this encounter.   Follow-up: Return in about 6 months (around 04/19/2021), or Use Voltaren Gel for hands..   Advised patient to have the Shingrix vaccine.  He was given information on BPH.  Will use Voltaren gel for arthritis of his hands.  Continue all other medicines as above. Libby Maw, MD  8/25 addendum: stress after friend and business partner passed un expectantly and patient has developed ongoing fever blisters in the perioral area.

## 2020-10-26 ENCOUNTER — Telehealth: Payer: Self-pay

## 2020-10-26 NOTE — Telephone Encounter (Signed)
Patient calling states that he has some fever blisters would like something for this. Last OV 10/17/20 please advise

## 2020-10-27 DIAGNOSIS — B002 Herpesviral gingivostomatitis and pharyngotonsillitis: Secondary | ICD-10-CM | POA: Insufficient documentation

## 2020-10-27 MED ORDER — VALACYCLOVIR HCL 1 G PO TABS: 1000.0000 mg | ORAL_TABLET | Freq: Two times a day (BID) | ORAL | 0 refills | Status: AC

## 2020-10-27 NOTE — Addendum Note (Signed)
Addended by: Jon Billings on: 10/27/2020 07:59 AM   Modules accepted: Orders

## 2020-10-27 NOTE — Telephone Encounter (Signed)
Patient aware and will pick up Rx today.

## 2020-11-06 ENCOUNTER — Other Ambulatory Visit: Payer: Self-pay | Admitting: Family Medicine

## 2020-11-06 DIAGNOSIS — E782 Mixed hyperlipidemia: Secondary | ICD-10-CM

## 2021-01-06 ENCOUNTER — Telehealth: Payer: Self-pay | Admitting: Family Medicine

## 2021-01-06 NOTE — Telephone Encounter (Signed)
Left message for patient to call back and schedule Medicare Annual Wellness Visit (AWV) in office.   If not able to come in office, please offer to do virtually or by telephone.  Left office number and my jabber [phone].  Last AWV:10/07/2019  Please schedule at anytime with Nurse Health Advisor.

## 2021-01-09 ENCOUNTER — Other Ambulatory Visit: Payer: Self-pay

## 2021-01-10 ENCOUNTER — Ambulatory Visit: Payer: Medicare Other | Admitting: Nurse Practitioner

## 2021-01-10 DIAGNOSIS — N3000 Acute cystitis without hematuria: Secondary | ICD-10-CM | POA: Diagnosis not present

## 2021-01-10 DIAGNOSIS — S39012A Strain of muscle, fascia and tendon of lower back, initial encounter: Secondary | ICD-10-CM | POA: Diagnosis not present

## 2021-01-16 ENCOUNTER — Other Ambulatory Visit: Payer: Self-pay

## 2021-01-16 ENCOUNTER — Ambulatory Visit (INDEPENDENT_AMBULATORY_CARE_PROVIDER_SITE_OTHER): Payer: Medicare Other | Admitting: Nurse Practitioner

## 2021-01-16 ENCOUNTER — Encounter: Payer: Self-pay | Admitting: Nurse Practitioner

## 2021-01-16 ENCOUNTER — Ambulatory Visit (INDEPENDENT_AMBULATORY_CARE_PROVIDER_SITE_OTHER)
Admission: RE | Admit: 2021-01-16 | Discharge: 2021-01-16 | Disposition: A | Discharge status: Home / Self Care | Payer: Medicare Other | Source: Ambulatory Visit | Attending: Nurse Practitioner | Admitting: Nurse Practitioner

## 2021-01-16 VITALS — BP 128/68 | HR 62 | Temp 97.7°F | Ht 66.0 in | Wt 187.0 lb

## 2021-01-16 DIAGNOSIS — R809 Proteinuria, unspecified: Secondary | ICD-10-CM | POA: Insufficient documentation

## 2021-01-16 DIAGNOSIS — R350 Frequency of micturition: Secondary | ICD-10-CM

## 2021-01-16 DIAGNOSIS — R39198 Other difficulties with micturition: Secondary | ICD-10-CM | POA: Diagnosis not present

## 2021-01-16 DIAGNOSIS — M545 Low back pain, unspecified: Secondary | ICD-10-CM

## 2021-01-16 LAB — MICROALBUMIN / CREATININE URINE RATIO
Creatinine,U: 107.9 mg/dL
Microalb Creat Ratio: 7.7 mg/g (ref 0.0–30.0)
Microalb, Ur: 8.3 mg/dL — ABNORMAL HIGH (ref 0.0–1.9)

## 2021-01-16 LAB — POC URINALSYSI DIPSTICK (AUTOMATED)
Blood, UA: NEGATIVE
Glucose, UA: NEGATIVE
Leukocytes, UA: NEGATIVE
Nitrite, UA: NEGATIVE
Protein, UA: POSITIVE — AB
Spec Grav, UA: 1.02 (ref 1.010–1.025)
Urobilinogen, UA: 0.2 E.U./dL
pH, UA: 6 (ref 5.0–8.0)

## 2021-01-16 LAB — BASIC METABOLIC PANEL
BUN: 16 mg/dL (ref 6–23)
CO2: 30 mEq/L (ref 19–32)
Calcium: 9.7 mg/dL (ref 8.4–10.5)
Chloride: 95 mEq/L — ABNORMAL LOW (ref 96–112)
Creatinine, Ser: 0.8 mg/dL (ref 0.40–1.50)
GFR: 88.63 mL/min (ref >60.00)
Glucose, Bld: 278 mg/dL — ABNORMAL HIGH (ref 70–99)
Potassium: 4.2 mEq/L (ref 3.5–5.1)
Sodium: 135 mEq/L (ref 135–145)

## 2021-01-16 LAB — PSA: PSA: 2.83 ng/mL (ref 0.10–4.00)

## 2021-01-16 NOTE — Assessment & Plan Note (Signed)
Patient was seen and evaluated in urgent care and was written for methocarbamol prednisone.  Patient is finishing up both medications states that the methocarbamol really seem to help.  Continue taking meds as prescribed we will get x-ray in office given length of time patient is having back discomfort.  Pending x-ray results continue medications as prescribed from other providers.

## 2021-01-16 NOTE — Progress Notes (Signed)
Acute Office Visit  Subjective:    Patient ID: Kurt Kidd, male    DOB: [birth year], 70 y.o.   MRN: [id number]  Chief Complaint  Patient presents with   Back Pain    X 3 months. Was seen at urgent care 6 days ago. Received letter that he needed to have his urine rechecked.    Polyuria    Over one month      Patient is in today for Back Pain and urinary complaints   Back Pain: states started approx 3 months ago. Feels tight and states that it would "grab" him from sitting to a standing position. No know  injury. Concerned it is his kidneys because he Intermittently, worse in the mornings. States that after he gets up and moving it will losen  Urinary: currently on flomax worked for him but now having stream issues. States not a strong. Trouble starting stream and dribbling. Nocturia x3 last night but also drank 3 bottles of water.   Past Medical History:  Diagnosis Date   CAD (coronary artery disease)    DES.. 2004 /  catheterization 2008, 10% narrowing stent site, pain not cardiac   Colitis, ischemic ([place])    Colon polyp    Degenerative joint disease    Diabetes mellitus 2012   Diverticulosis    Dyslipidemia    Ejection fraction    65-70%, echo, March, 2012   GERD (gastroesophageal reflux disease)    Hyperlipidemia    Hypertension    Mitral regurgitation    Echo, mild, 2012   Murmur    Onychomycosis    Overweight(278.02)    Palpitations    Rapid heartbeat and shortness of breath after eating 2008 recurrent episode 2012    Past Surgical History:  Procedure Laterality Date   ANGIOPLASTY / STENTING FEMORAL  09/09/2002   CARDIAC CATHETERIZATION     COLONOSCOPY  2014   Dr Bary Castilla   COLONOSCOPY  11/26/2007   Dr Patterson/POLYPOID COLONIC MUCOSA WITH HYPEREMIA AND   COLONOSCOPY WITH PROPOFOL N/A 07/13/2016   Procedure: COLONOSCOPY WITH PROPOFOL;  Surgeon: Robert Bellow, MD;  Location: [facility];  Service: Endoscopy;  Laterality: N/A;    ESOPHAGOGASTRODUODENOSCOPY (EGD) WITH PROPOFOL N/A 07/13/2016   Procedure: ESOPHAGOGASTRODUODENOSCOPY (EGD) WITH PROPOFOL;  Surgeon: Robert Bellow, MD;  Location: [facility];  Service: Endoscopy;  Laterality: N/A;   TOTAL ANKLE ARTHROPLASTY Left 02/25/2020   Procedure: TOTAL ANKLE ARTHROPLASTY;  Surgeon: Wylene Simmer, MD;  Location: [place];  Service: Orthopedics;  Laterality: Left;   UPPER GI ENDOSCOPY  11/26/2007   Dr Sharlett Iles    Family History  Problem Relation Age of Onset   Heart failure Mother    Heart disease Mother    Diabetes Father        father also had ASHD/ CABG   Heart disease Father    Colon cancer Brother 74   AAA (abdominal aortic aneurysm) Brother 69    Social History   Socioeconomic History   Marital status: Married    Spouse name: Not on file   Number of children: Not on file   Years of education: Not on file   Highest education level: Not on file  Occupational History   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    Employer: Manufacturing engineer  Tobacco Use   Smoking status: Former    Packs/day: 1.00    Years: 26.00    Pack years: 26.00    Types: Cigarettes    Quit  date: 03/05/1990    Years since quitting: 30.8   Smokeless tobacco: Never  Substance and Sexual Activity   Alcohol use: Yes    Alcohol/week: 0.0 standard drinks    Comment: 2 drinks per day   Drug use: No   Sexual activity: Not on file  Other Topics Concern   Not on file  Social History Narrative   Not on file   Social Determinants of Health   Financial Resource Strain: Not on file  Food Insecurity: Not on file  Transportation Needs: Not on file  Physical Activity: Not on file  Stress: Not on file  Social Connections: Not on file  Intimate Partner Violence: Not on file    Outpatient Medications Prior to Visit  Medication Sig Dispense Refill   atorvastatin (LIPITOR) 80 MG tablet Take 1 tablet by mouth once daily 90 tablet 0   Blood Glucose Monitoring Suppl  (ONE TOUCH ULTRA SYSTEM KIT) W/DEVICE KIT 1 kit by Does not apply route once. 1 each 0   esomeprazole (NEXIUM) 20 MG capsule Take 20 mg by mouth daily at 12 noon.     ezetimibe (ZETIA) 10 MG tablet Take 1 tablet by mouth once daily 90 tablet 0   metFORMIN (GLUCOPHAGE) 500 MG tablet TAKE 1 TABLET BY MOUTH THREE TIMES DAILY 270 tablet 0   metoprolol tartrate (LOPRESSOR) 50 MG tablet Take 1 tablet (50 mg total) by mouth 2 (two) times daily. 180 tablet 3   Multiple Vitamins-Minerals (MULTIVITAMIN WITH MINERALS) tablet Take 1 tablet by mouth daily.     naproxen sodium (ALEVE) 220 MG tablet Take 220 mg by mouth daily as needed (pain).     nitroGLYCERIN (NITROSTAT) 0.4 MG SL tablet Place 1 tablet (0.4 mg total) under the tongue every 5 (five) minutes as needed. 25 tablet 1   rivaroxaban (XARELTO) 10 MG TABS tablet Take 1 tablet (10 mg total) by mouth daily. 14 tablet 0   tamsulosin (FLOMAX) 0.4 MG CAPS capsule Take one daily (Patient taking differently: Take 0.4 mg by mouth daily. Take one daily) 90 capsule 3   traMADol (ULTRAM) 50 MG tablet TAKE 1 TABLET BY MOUTH AT NIGHT AS NEEDED 30 tablet 0   methocarbamol (ROBAXIN) 750 MG tablet Take 750 mg by mouth 3 (three) times daily as needed.     predniSONE (DELTASONE) 20 MG tablet Take by mouth.     docusate sodium (COLACE) 100 MG capsule Take 1 capsule (100 mg total) by mouth 2 (two) times daily. While taking narcotic pain medicine. 30 capsule 0   senna (SENOKOT) 8.6 MG TABS tablet Take 2 tablets (17.2 mg total) by mouth 2 (two) times daily. 30 tablet 0   No facility-administered medications prior to visit.    No Known Allergies  Review of Systems  Respiratory:  Negative for cough and shortness of breath.   Cardiovascular:  Negative for chest pain.  Gastrointestinal:  Negative for constipation, diarrhea, nausea and vomiting.  Genitourinary:  Positive for frequency. Negative for dysuria, hematuria, penile discharge and penile pain.  Musculoskeletal:   Positive for back pain.  Neurological:  Negative for weakness and numbness.      Objective:    Physical Exam Vitals and nursing note reviewed. Exam conducted with a chaperone present Kurt Kidd, CMA).  Constitutional:      Appearance: Normal appearance.  Neck:     Vascular: Carotid bruit present.  Cardiovascular:     Rate and Rhythm: Normal rate and regular rhythm.     Heart  sounds: Murmur heard.  Pulmonary:     Effort: Pulmonary effort is normal.     Breath sounds: Normal breath sounds.  Abdominal:     General: Bowel sounds are normal.  Genitourinary:    Penis: Normal and circumcised.      Testes: Normal.     Prostate: Not tender and no nodules present.     Rectum: Normal.  Musculoskeletal:        General: No tenderness or signs of injury.     Lumbar back: No signs of trauma, tenderness or bony tenderness. Negative right straight leg raise test and negative left straight leg raise test.     Right lower leg: No edema.     Left lower leg: No edema.     Comments: Paraspinal lumbar muscle tightness  Lymphadenopathy:     Lower Body: No right inguinal adenopathy. No left inguinal adenopathy.  Skin:    General: Skin is warm.  Neurological:     Mental Status: He is alert.     Deep Tendon Reflexes:     Reflex Scores:      Patellar reflexes are 2+ on the right side and 2+ on the left side.    Comments: Bilateral upper and lower extremity strength 5/5  Psychiatric:        Mood and Affect: Mood normal.        Behavior: Behavior normal.        Thought Content: Thought content normal.        Judgment: Judgment normal.    BP 128/68   Pulse 62   Temp 97.7 F (36.5 C) (Temporal)   Ht 5' 6"  (1.676 m)   Wt 187 lb (84.8 kg)   SpO2 95%   BMI 30.18 kg/m  Wt Readings from Last 3 Encounters:  01/16/21 187 lb (84.8 kg)  10/17/20 184 lb 3.2 oz (83.6 kg)  02/25/20 186 lb 4.6 oz (84.5 kg)    Health Maintenance Due  Topic Date Due   FOOT EXAM  Never done   Hepatitis C  Screening  Never done   Zoster Vaccines- Shingrix (1 of 2) Never done   Pneumonia Vaccine 74+ Years old (2 - PPSV23 if available, else PCV20) 05/28/2018   OPHTHALMOLOGY EXAM  12/15/2019   HEMOGLOBIN A1C  05/22/2020   INFLUENZA VACCINE  10/03/2020    There are no preventive care reminders to display for this patient.   Lab Results  Component Value Date   TSH 1.95 05/27/2017   Lab Results  Component Value Date   WBC 7.1 10/17/2020   HGB 13.6 10/17/2020   HCT 39.5 10/17/2020   MCV 85.9 10/17/2020   PLT 268.0 10/17/2020   Lab Results  Component Value Date   NA 139 10/17/2020   K 4.1 10/17/2020   CO2 29 10/17/2020   GLUCOSE 124 (H) 10/17/2020   BUN 11 10/17/2020   CREATININE 0.65 10/17/2020   BILITOT 0.7 10/17/2020   ALKPHOS 82 10/17/2020   AST 21 10/17/2020   ALT 26 10/17/2020   PROT 6.5 10/17/2020   ALBUMIN 4.4 10/17/2020   CALCIUM 9.6 10/17/2020   ANIONGAP 10 02/22/2020   GFR 94.53 10/17/2020   Lab Results  Component Value Date   CHOL 149 10/17/2020   Lab Results  Component Value Date   HDL 54.20 10/17/2020   Lab Results  Component Value Date   LDLCALC 74 10/17/2020   Lab Results  Component Value Date   TRIG 104.0 10/17/2020   Lab Results  Component Value Date   CHOLHDL 3 10/17/2020   Lab Results  Component Value Date   HGBA1C 7.3 (H) 11/23/2019       Assessment & Plan:   Problem List Items Addressed This Visit       Other   Lumbar back pain    Patient was seen and evaluated in urgent care and was written for methocarbamol prednisone.  Patient is finishing up both medications states that the methocarbamol really seem to help.  Continue taking meds as prescribed we will get x-ray in office given length of time patient is having back discomfort.  Pending x-ray results continue medications as prescribed from other providers.        Relevant Medications   methocarbamol (ROBAXIN) 750 MG tablet   predniSONE (DELTASONE) 20 MG tablet   Other  Relevant Orders   DG Lumbar Spine Complete   Frequent urination - Primary    Patient is complaining about frequent urination along with urinary stream issues.  Does have a history of BPH urinary hesitancy and chart.  Patient maintained on tamsulosin 0.4 mg not established with urologist.  No history of prostatitis per  Patient.  We will send urine off for culture checked basic labs inclusive of PSA and kidney function.  Pending lab results.      Relevant Orders   POCT Urinalysis Dipstick (Automated) (Completed)   Urine Culture   PSA   Basic metabolic panel   Abnormal urinary stream   Relevant Orders   PSA   Microalbuminuria    Patient was evaluated in urgent care checked microalbumin.  Sent letter stating that he had followed up and checked out by primary care provider.  He had a microalbumin creatinine ratio done in August of this year we will repeat today had a concerning letter to patient from urgent care.  Pending lab results.      Relevant Orders   Microalbumin / creatinine urine ratio   Basic metabolic panel     No orders of the defined types were placed in this encounter.  This visit occurred during the SARS-CoV-2 public health emergency.  Safety protocols were in place, including screening questions prior to the visit, additional usage of staff PPE, and extensive cleaning of exam room while observing appropriate contact time as indicated for disinfecting solutions.    Romilda Garret, NP

## 2021-01-16 NOTE — Assessment & Plan Note (Signed)
Patient was evaluated in urgent care checked microalbumin.  Sent letter stating that he had followed up and checked out by primary care provider.  He had a microalbumin creatinine ratio done in August of this year we will repeat today had a concerning letter to patient from urgent care.  Pending lab results.

## 2021-01-16 NOTE — Assessment & Plan Note (Signed)
Patient is complaining about frequent urination along with urinary stream issues.  Does have a history of BPH urinary hesitancy and chart.  Patient maintained on tamsulosin 0.4 mg not established with urologist.  No history of prostatitis per  Patient.  We will send urine off for culture checked basic labs inclusive of PSA and kidney function.  Pending lab results.

## 2021-01-16 NOTE — Patient Instructions (Signed)
Nice to see you today Will be in touch with the lab results and back xray

## 2021-01-17 LAB — URINE CULTURE
MICRO NUMBER:: 12633297
Result:: NO GROWTH
SPECIMEN QUALITY:: ADEQUATE

## 2021-01-19 ENCOUNTER — Encounter: Payer: Self-pay | Admitting: Family Medicine

## 2021-01-19 ENCOUNTER — Ambulatory Visit (INDEPENDENT_AMBULATORY_CARE_PROVIDER_SITE_OTHER): Payer: Medicare Other | Admitting: Family Medicine

## 2021-01-19 ENCOUNTER — Other Ambulatory Visit: Payer: Self-pay

## 2021-01-19 VITALS — BP 118/60 | HR 93 | Temp 96.1°F | Ht 66.0 in | Wt 184.1 lb

## 2021-01-19 DIAGNOSIS — R808 Other proteinuria: Secondary | ICD-10-CM | POA: Diagnosis not present

## 2021-01-19 DIAGNOSIS — M545 Low back pain, unspecified: Secondary | ICD-10-CM

## 2021-01-19 DIAGNOSIS — R809 Proteinuria, unspecified: Secondary | ICD-10-CM | POA: Diagnosis not present

## 2021-01-19 MED ORDER — PREDNISONE 20 MG PO TABS: ORAL_TABLET | ORAL | 0 refills | Status: DC

## 2021-01-19 MED ORDER — METHOCARBAMOL 750 MG PO TABS: 750.0000 mg | ORAL_TABLET | Freq: Three times a day (TID) | ORAL | 0 refills | Status: DC | PRN

## 2021-01-19 NOTE — Patient Instructions (Signed)
If you are still doing poorly in 3 weeks, then please let me know.

## 2021-01-19 NOTE — Progress Notes (Signed)
Kurt Bartolotta T. Irineo Gaulin, MD, [place] [place], [id number]  Phone: [phone]  FAX: [phone]  Kurt Kidd - 70 y.o. male  MRN [id number]  Date of Birth: 1951-02-18  Date: 01/19/2021  PCP: Libby Maw, MD  Referral: Libby Maw,*  Chief Complaint  Patient presents with   Back Pain    Seen by Romilda Garret 01/16/21    This visit occurred during the SARS-CoV-2 public health emergency.  Safety protocols were in place, including screening questions prior to the visit, additional usage of staff PPE, and extensive cleaning of exam room while observing appropriate contact time as indicated for disinfecting solutions.   Subjective:   Kurt Kidd is a 69 y.o. very pleasant male patient with Body mass index is 29.72 kg/m. who presents with the following:  F/u regarding back pain:  3 months history.  He saw Mr. Cable only a few days ago, and prior to this he did go to urgent care for evaluation of his back.  History is also clinically significant for L total ankle arthroplasty done in 2021 by Dr. Doran Durand.  Right now, he has been having difficulty rising from a seated position while sitting in a chair, couch, or toilet for example.  He is having pain predominantly on his right posterior back and buttocks.  He does not describe any radicular symptoms, numbness, or tingling.  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  L total ankle 02/2020.  Has been to [facility] in the past (Dr. Nelva Bush) for other back symptoms, and procedural intervention by history.  Robaxin will take two and some steoids, and then took some pain pulls in the morning and at night.   Best friend just past away. He died from cancer in the spine.   Got a letter and a kit from medicare, where urinated in a cup   I pulled up his  lumbar spine films, and we reviewed them together today.  Rate 70, he does have relatively mild degenerative disc disease, predominantly L4-L5 as well as L5-S1.  There is also some scattered facet arthropathy but again not notable for age. Electronically Signed  By: Owens Loffler, MD On: 01/19/2021  3:20 PM EST   01/16/2021: he did have protein in his urine on dip, mildly elevated microalbumin without elevation microalbumin to creatinine ratio.  Results for orders placed or performed in visit on 01/16/21  Urine Culture   Specimen: Urine  Result Value Ref Range   MICRO NUMBER: [id number]    SPECIMEN QUALITY: Adequate    Sample Source NOT GIVEN    STATUS: FINAL    Result: No Growth   PSA  Result Value Ref Range   PSA 2.83 0.10 - 4.00 ng/mL  Microalbumin / creatinine urine ratio  Result Value Ref Range   Microalb, Ur 8.3 (H) 0.0 - 1.9 mg/dL   Creatinine,U 107.9 mg/dL   Microalb Creat Ratio 7.7 0.0 - 30.0 mg/g  Basic metabolic panel  Result Value Ref Range   Sodium 135 135 - 145 mEq/L   Potassium 4.2 3.5 - 5.1 mEq/L   Chloride 95 (L) 96 - 112 mEq/L   CO2 30 19 - 32 mEq/L   Glucose, Bld 278 (H) 70 - 99 mg/dL   BUN 16 6 - 23 mg/dL   Creatinine, Ser 0.80 0.40 - 1.50  mg/dL   GFR 88.63 >60.00 mL/min   Calcium 9.7 8.4 - 10.5 mg/dL  POCT Urinalysis Dipstick (Automated)  Result Value Ref Range   Color, UA yellow    Clarity, UA clear    Glucose, UA Negative Negative   Bilirubin, UA 1+    Ketones, UA trace    Spec Grav, UA 1.020 1.010 - 1.025   Blood, UA neg    pH, UA 6.0 5.0 - 8.0   Protein, UA Positive (A) Negative   Urobilinogen, UA 0.2 0.2 or 1.0 E.U./dL   Nitrite, UA neg    Leukocytes, UA Negative Negative     Review of Systems is noted in the HPI, as appropriate  Patient Active Problem List   Diagnosis Date Noted   Frequent urination 01/16/2021   Abnormal urinary stream 01/16/2021   Microalbuminuria 01/16/2021   Oral herpes 10/27/2020   Arthritis of both hands  10/17/2020   Chronic pain of left ankle 11/23/2019   Acute eczematoid otitis externa of left ear 05/18/2019   Plantar fasciitis of left foot 05/18/2019   Bilateral carotid artery stenosis 02/16/2019   Need for influenza vaccination 02/16/2019   Asthmatic bronchitis 03/28/2018   Pterygium eye, right 02/17/2018   H/O iron deficiency anemia 05/27/2017   Low serum iron 06/23/2016   Absolute anemia 06/22/2016   Family history of abdominal aortic aneurysm 06/04/2016   Left carotid bruit 06/04/2016   Former smoker 06/04/2016   Carotid artery disease ([place]) 05/21/2016   Right hand pain 12/30/2015   Diabetes mellitus type 2, noninsulin dependent ([place]) 05/11/2014   Lumbar back pain 08/24/2013   Shortness of breath 07/13/2013   Adjustment disorder with mixed anxiety and depressed mood 07/07/2013   Benign prostatic hyperplasia with urinary hesitancy 12/29/2012   Essential hypertension    Coronary artery disease involving native coronary artery of native heart without angina pectoris    Dyslipidemia    GERD (gastroesophageal reflux disease)    Diverticulosis    Colitis, ischemic (HCC)    Onychomycosis    Palpitations    Mitral regurgitation    Overweight 06/01/2007   Mixed hyperlipidemia 04/14/2007   BRONCHITIS, ACUTE 04/14/2007   Osteoarthritis 04/14/2007    Past Medical History:  Diagnosis Date   CAD (coronary artery disease)    DES.. 2004 /  catheterization 2008, 10% narrowing stent site, pain not cardiac   Colitis, ischemic ([place])    Colon polyp    Degenerative joint disease    Diabetes mellitus 2012   Diverticulosis    Dyslipidemia    Ejection fraction    65-70%, echo, March, 2012   GERD (gastroesophageal reflux disease)    Hyperlipidemia    Hypertension    Mitral regurgitation    Echo, mild, 2012   Murmur    Onychomycosis    Overweight(278.02)    Palpitations    Rapid heartbeat and shortness of breath after eating 2008 recurrent episode 2012    Past Surgical  History:  Procedure Laterality Date   ANGIOPLASTY / STENTING FEMORAL  09/09/2002   CARDIAC CATHETERIZATION     COLONOSCOPY  2014   Dr Bary Castilla   COLONOSCOPY  11/26/2007   Dr Patterson/POLYPOID COLONIC MUCOSA WITH HYPEREMIA AND   COLONOSCOPY WITH PROPOFOL N/A 07/13/2016   Procedure: COLONOSCOPY WITH PROPOFOL;  Surgeon: Robert Bellow, MD;  Location: [facility] ENDOSCOPY;  Service: Endoscopy;  Laterality: N/A;   ESOPHAGOGASTRODUODENOSCOPY (EGD) WITH PROPOFOL N/A 07/13/2016   Procedure: ESOPHAGOGASTRODUODENOSCOPY (EGD) WITH PROPOFOL;  Surgeon: Robert Bellow, MD;  Location: [facility] ENDOSCOPY;  Service: Endoscopy;  Laterality: N/A;   TOTAL ANKLE ARTHROPLASTY Left 02/25/2020   Procedure: TOTAL ANKLE ARTHROPLASTY;  Surgeon: Wylene Simmer, MD;  Location: [place];  Service: Orthopedics;  Laterality: Left;   UPPER GI ENDOSCOPY  11/26/2007   Dr Sharlett Iles    Family History  Problem Relation Age of Onset   Heart failure Mother    Heart disease Mother    Diabetes Father        father also had ASHD/ CABG   Heart disease Father    Colon cancer Brother 4   AAA (abdominal aortic aneurysm) Brother 73     Objective:   BP 118/60   Pulse 93   Temp (!) 96.1 F (35.6 C) (Temporal)   Ht 5' 6"  (1.676 m)   Wt 184 lb 2 oz (83.5 kg)   SpO2 95%   BMI 29.72 kg/m   GEN: No acute distress; alert,appropriate. PULM: Breathing comfortably in no respiratory distress PSYCH: Normally interactive.    Range of motion at  the waist: Flexion, extension, lateral bending and rotation: Restriction of extension with pain.  Otherwise grossly normal.  No echymosis or edema Rises to examination table with mild difficulty Gait: minimally antalgic  Inspection/Deformity: N Paraspinus Tenderness: Mild, L4-S1 bilaterally.  B Ankle Dorsiflexion (L5,4): 5/5 B Great Toe Dorsiflexion (L5,4): 5/5 Heel Walk (L5): WNL Toe Walk (S1): WNL Rise/Squat (L4): WNL, mild pain  He has notable decrease Mast on  the left lower extremity compared to the right, and in the calf there is remarkable loss of muscle on the left compared to the right.  SENSORY B Medial Foot (L4): WNL B Dorsum (L5): WNL B Lateral (S1): WNL Light Touch: WNL Pinprick: WNL  REFLEXES Knee (L4): 2+ Ankle (S1): 2+  B SLR, seated: neg B SLR, supine: neg B FABER: neg B Reverse FABER: neg B Greater Troch: NT B Log Roll: neg B Sciatic Notch: NT   Laboratory and Imaging Data: CLINICAL DATA:  Lumbar pain.   EXAM: LUMBAR SPINE - COMPLETE 4+ VIEW   COMPARISON:  None.   FINDINGS: There is no evidence of lumbar spine fracture. Alignment is normal. There is mild disc space narrowing and endplate osteophyte formation throughout the visualized lower thoracic and lumbar spine. There are degenerative changes of lower lumbar facet joints at L4-L5 and L5-S1. There are atherosclerotic calcifications of the aorta and iliac arteries.   IMPRESSION: 1. No acute fracture or malalignment. 2. Mild to moderate degenerative changes.     Electronically Signed   By: Ronney Asters M.D.   On: 01/16/2021 22:23  Assessment and Plan:     ICD-10-CM   1. Low back pain of over 3 months duration  M54.50 Protein, urine, 24 hour    2. Microalbuminuria  R80.9     3. Other proteinuria  R80.8      Ongoing mechanical back pain.  Degenerative changes are minimal for age.  Focal without radicular symptoms or neurological changes.  He also has dramatic muscular atrophy on the left lower extremity, presumably postoperative since his total ankle arthroplasty.  My suspicion is that this has played a role in the provocation of his ongoing back pain.  Pulse with steroids again, Robaxin as needed, and gave him a fairly comprehensive rehab program.  He knows to call me in a few weeks and if he is still having issues then I think around the physical therapy would be entirely appropriate in his case.  With  proteinuria and mild elevation of  microalbumin even with normal creatinine ratio, he is quite concerned about his urine and kidneys.  I think it is appropriate in this case to get a 24-hour urine to put the issue at rest and ensure that the patient does have a normal kidney function.  If this becomes abnormal then I we will have him follow-up with his primary care doctor.  Patient Instructions  If you are still doing poorly in 3 weeks, then please let me know.   Meds ordered this encounter  Medications   methocarbamol (ROBAXIN) 750 MG tablet    Sig: Take 1 tablet (750 mg total) by mouth 3 (three) times daily as needed.    Dispense:  60 tablet    Refill:  0   predniSONE (DELTASONE) 20 MG tablet    Sig: 2 tabs po for 7 days, then 1 tab po for 7 days    Dispense:  21 tablet    Refill:  0   Medications Discontinued During This Encounter  Medication Reason   predniSONE (DELTASONE) 20 MG tablet Completed Course   methocarbamol (ROBAXIN) 750 MG tablet Reorder   Orders Placed This Encounter  Procedures   Protein, urine, 24 hour    Follow-up: No follow-ups on file.  [employer] speech-to-text software was used for transcription in this dictation.  Possible transcriptional errors can occur using Editor, commissioning.   Signed,  Maud Deed. Lujain Kraszewski, MD   Outpatient Encounter Medications as of 01/19/2021  Medication Sig   atorvastatin (LIPITOR) 80 MG tablet Take 1 tablet by mouth once daily   Blood Glucose Monitoring Suppl (ONE TOUCH ULTRA SYSTEM KIT) W/DEVICE KIT 1 kit by Does not apply route once.   esomeprazole (NEXIUM) 20 MG capsule Take 20 mg by mouth daily at 12 noon.   ezetimibe (ZETIA) 10 MG tablet Take 1 tablet by mouth once daily   metFORMIN (GLUCOPHAGE) 500 MG tablet TAKE 1 TABLET BY MOUTH THREE TIMES DAILY   metoprolol tartrate (LOPRESSOR) 50 MG tablet Take 1 tablet (50 mg total) by mouth 2 (two) times daily.   Multiple Vitamins-Minerals (MULTIVITAMIN WITH MINERALS) tablet Take 1 tablet by mouth daily.    naproxen sodium (ALEVE) 220 MG tablet Take 220 mg by mouth daily as needed (pain).   nitroGLYCERIN (NITROSTAT) 0.4 MG SL tablet Place 1 tablet (0.4 mg total) under the tongue every 5 (five) minutes as needed.   predniSONE (DELTASONE) 20 MG tablet 2 tabs po for 7 days, then 1 tab po for 7 days   rivaroxaban (XARELTO) 10 MG TABS tablet Take 1 tablet (10 mg total) by mouth daily.   tamsulosin (FLOMAX) 0.4 MG CAPS capsule Take one daily   traMADol (ULTRAM) 50 MG tablet TAKE 1 TABLET BY MOUTH AT NIGHT AS NEEDED   [DISCONTINUED] methocarbamol (ROBAXIN) 750 MG tablet Take 750 mg by mouth 3 (three) times daily as needed.   [DISCONTINUED] predniSONE (DELTASONE) 20 MG tablet Take by mouth.   methocarbamol (ROBAXIN) 750 MG tablet Take 1 tablet (750 mg total) by mouth 3 (three) times daily as needed.   No facility-administered encounter medications on file as of 01/19/2021.

## 2021-01-22 ENCOUNTER — Encounter: Payer: Self-pay | Admitting: Family Medicine

## 2021-01-23 ENCOUNTER — Other Ambulatory Visit: Payer: Medicare Other

## 2021-01-23 ENCOUNTER — Other Ambulatory Visit: Payer: Self-pay

## 2021-01-23 DIAGNOSIS — M545 Low back pain, unspecified: Secondary | ICD-10-CM

## 2021-01-24 LAB — PROTEIN, URINE, 24 HOUR: Protein, 24H Urine: 264 mg/24 h — ABNORMAL HIGH (ref 0–149)

## 2021-02-02 ENCOUNTER — Other Ambulatory Visit: Payer: Self-pay | Admitting: Family Medicine

## 2021-02-02 DIAGNOSIS — I6523 Occlusion and stenosis of bilateral carotid arteries: Secondary | ICD-10-CM

## 2021-02-02 DIAGNOSIS — N401 Enlarged prostate with lower urinary tract symptoms: Secondary | ICD-10-CM

## 2021-02-02 DIAGNOSIS — E782 Mixed hyperlipidemia: Secondary | ICD-10-CM

## 2021-03-15 ENCOUNTER — Ambulatory Visit (INDEPENDENT_AMBULATORY_CARE_PROVIDER_SITE_OTHER): Payer: Medicare Other

## 2021-03-15 ENCOUNTER — Other Ambulatory Visit: Payer: Self-pay

## 2021-03-15 VITALS — BP 140/78 | HR 69 | Temp 97.3°F | Ht 66.0 in | Wt 186.0 lb

## 2021-03-15 DIAGNOSIS — Z0000 Encounter for general adult medical examination without abnormal findings: Secondary | ICD-10-CM | POA: Diagnosis not present

## 2021-03-15 NOTE — Progress Notes (Addendum)
Subjective:   Kurt Kidd is a 71 y.o. male who presents for an Subsequent  Medicare Annual Wellness Visit.  Review of Systems     Cardiac Risk Factors include: advanced age (>19mn, >>70women);diabetes mellitus;dyslipidemia;male gender;hypertension     Objective:    Today's Vitals   03/15/21 0823 03/15/21 0824  BP: 140/78   Pulse: 69   Temp: (!) 97.3 F (36.3 C)   SpO2: 95%   Weight: 186 lb (84.4 kg)   Height: 5' 6" (1.676 m)   PainSc:  3    Body mass index is 30.02 kg/m.  Advanced Directives 03/15/2021 02/25/2020 02/19/2020 10/07/2019 07/13/2016 07/04/2015  Does Patient Have a Medical Advance Directive? _0  No  Would patient like information on creating a medical advance directive? No - Patient declined No - Patient declined No - Patient declined No - Patient declined No - Patient declined -    Current Medications (verified) Outpatient Encounter Medications as of 03/15/2021  Medication Sig   atorvastatin (LIPITOR) 80 MG tablet Take 1 tablet by mouth once daily   Blood Glucose Monitoring Suppl (ONE TOUCH ULTRA SYSTEM KIT) W/DEVICE KIT 1 kit by Does not apply route once.   esomeprazole (NEXIUM) 20 MG capsule Take 20 mg by mouth daily at 12 noon.   ezetimibe (ZETIA) 10 MG tablet TAKE 1 TABLET BY MOUTH ONCE DAILY(CALL TO SCHEDULE A FOLLOW UP APPT)   metFORMIN (GLUCOPHAGE) 500 MG tablet TAKE 1 TABLET BY MOUTH THREE TIMES DAILY   methocarbamol (ROBAXIN) 750 MG tablet Take 1 tablet (750 mg total) by mouth 3 (three) times daily as needed.   metoprolol tartrate (LOPRESSOR) 50 MG tablet Take 1 tablet (50 mg total) by mouth 2 (two) times daily.   Multiple Vitamins-Minerals (MULTIVITAMIN WITH MINERALS) tablet Take 1 tablet by mouth daily.   nitroGLYCERIN (NITROSTAT) 0.4 MG SL tablet Place 1 tablet (0.4 mg total) under the tongue every 5 (five) minutes as needed.   rivaroxaban (XARELTO) 10 MG TABS tablet Take 1 tablet (10 mg total) by mouth daily.   tamsulosin (FLOMAX) 0.4  MG CAPS capsule Take 1 capsule by mouth once daily   traMADol (ULTRAM) 50 MG tablet TAKE 1 TABLET BY MOUTH AT NIGHT AS NEEDED   predniSONE (DELTASONE) 20 MG tablet 2 tabs po for 7 days, then 1 tab po for 7 days (Patient not taking: Reported on 03/15/2021)   No facility-administered encounter medications on file as of 03/15/2021.    Allergies (verified) Patient has no known allergies.   History: Past Medical History:  Diagnosis Date   CAD (coronary artery disease)    DES.. 2004 /  catheterization 2008, 10% narrowing stent site, pain not cardiac   Colitis, ischemic ([facility]    Colon polyp    Degenerative joint disease    Diabetes mellitus 2012   Diverticulosis    Dyslipidemia    Ejection fraction    65-70%, echo, March, 2012   GERD (gastroesophageal reflux disease)    Hyperlipidemia    Hypertension    Mitral regurgitation    Echo, mild, 2012   Murmur    Onychomycosis    Overweight(278.02)    Palpitations    Rapid heartbeat and shortness of breath after eating 2008 recurrent episode 2012   Past Surgical History:  Procedure Laterality Date   ANGIOPLASTY / STENTING FEMORAL  09/09/2002   CARDIAC CATHETERIZATION     COLONOSCOPY  2014   Dr BBary Castilla  COLONOSCOPY  11/26/2007  Dr Elvera Maria COLONIC MUCOSA WITH HYPEREMIA AND   COLONOSCOPY WITH PROPOFOL N/A 07/13/2016   Procedure: COLONOSCOPY WITH PROPOFOL;  Surgeon: Robert Bellow, MD;  Location: [facility] ENDOSCOPY;  Service: Endoscopy;  Laterality: N/A;   ESOPHAGOGASTRODUODENOSCOPY (EGD) WITH PROPOFOL N/A 07/13/2016   Procedure: ESOPHAGOGASTRODUODENOSCOPY (EGD) WITH PROPOFOL;  Surgeon: Robert Bellow, MD;  Location: [facility] ENDOSCOPY;  Service: Endoscopy;  Laterality: N/A;   TOTAL ANKLE ARTHROPLASTY Left 02/25/2020   Procedure: TOTAL ANKLE ARTHROPLASTY;  Surgeon: Wylene Simmer, MD;  Location: [facility];  Service: Orthopedics;  Laterality: Left;   UPPER GI ENDOSCOPY  11/26/2007   Dr Sharlett Iles   Family History   Problem Relation Age of Onset   Heart failure Mother    Heart disease Mother    Diabetes Father        father also had ASHD/ CABG   Heart disease Father    Colon cancer Brother 50   AAA (abdominal aortic aneurysm) Brother 32   Social History   Socioeconomic History   Marital status: Married    Spouse name: Not on file   Number of children: Not on file   Years of education: Not on file   Highest education level: Not on file  Occupational History   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    Employer: Manufacturing engineer  Tobacco Use   Smoking status: Former    Packs/day: 1.00    Years: 26.00    Pack years: 26.00    Types: Cigarettes    Quit date: 03/05/1990    Years since quitting: 31.0   Smokeless tobacco: Never  Substance and Sexual Activity   Alcohol use: Yes    Alcohol/week: 0.0 standard drinks    Comment: 2 drinks per day   Drug use: No   Sexual activity: Not on file  Other Topics Concern   Not on file  Social History Narrative   Not on file   Social Determinants of Health   Financial Resource Strain: Low Risk    Difficulty of Paying Living Expenses: Not hard at all  Food Insecurity: No Food Insecurity   Worried About Charity fundraiser in the Last Year: Never true   [place] in the Last Year: Never true  Transportation Needs: No Transportation Needs   Lack of Transportation (Medical): No   Lack of Transportation (Non-Medical): No  Physical Activity: Sufficiently Active   Days of Exercise per Week: 5 days   Minutes of Exercise per Session: 60 min  Stress: No Stress Concern Present   Feeling of Stress : Not at all  Social Connections: Moderately Isolated   Frequency of Communication with Friends and Family: Twice a week   Frequency of Social Gatherings with Friends and Family: Twice a week   Attends Religious Services: Never   Marine scientist or Organizations: No   Attends Music therapist: Never   Marital Status: Married     Tobacco Counseling Counseling given: Not Answered   Clinical Intake:  Pre-visit preparation completed: Yes  Pain : 0-10 Pain Score: 3  Pain Location: Back Pain Orientation: Lower Pain Descriptors / Indicators: Constant Pain Onset: Other (comment) (3 months) Pain Relieving Factors: Tylenol  Pain Relieving Factors: Tylenol  Nutritional Risks: None Diabetes: Yes CBG done?: No Did pt. bring in CBG monitor from home?: No  How often do you need to have someone help you when you read instructions, pamphlets, or other written materials from your doctor or pharmacy?: 1 -  Never What is the last grade level you completed in school?: college  Diabetic?Yes Nutrition Risk Assessment:  Has the patient had any N/V/D within the last 2 months?  No  Does the patient have any non-healing wounds?  No  Has the patient had any unintentional weight loss or weight gain?  No   Diabetes:  Is the patient diabetic?  Yes  If diabetic, was a CBG obtained today?  No  Did the patient bring in their glucometer from home?  No  How often do you monitor your CBG's? Never .   Financial Strains and Diabetes Management:  Are you having any financial strains with the device, your supplies or your medication? No .  Does the patient want to be seen by Chronic Care Management for management of their diabetes?  No  Would the patient like to be referred to a Nutritionist or for Diabetic Management?  No   Diabetic Exams:  Diabetic Eye Exam: Completed 03/2020 Diabetic Foot Exam: Overdue, Pt has been advised about the importance in completing this exam. Pt is scheduled for diabetic foot exam on next office visit .   Interpreter Needed?: No  Information entered by :: [place] of Daily Living In your present state of health, do you have any difficulty performing the following activities: 03/15/2021  Hearing? N  Vision? N  Difficulty concentrating or making decisions? N  Walking or  climbing stairs? N  Dressing or bathing? N  Doing errands, shopping? N  Preparing Food and eating ? N  Using the Toilet? N  In the past six months, have you accidently leaked urine? N  Do you have problems with loss of bowel control? N  Managing your Medications? N  Managing your Finances? N  Housekeeping or managing your Housekeeping? N  Some recent data might be hidden    Patient Care Team: Libby Maw, MD as PCP - General (Family Medicine) Debara Pickett Nadean Corwin, MD as PCP - Cardiology (Cardiology) Noralee Space, MD as Consulting Physician (Pulmonary Disease) Bary Castilla Forest Gleason, MD (General Surgery)  Indicate any recent Medical Services you may have received from other than [facility] providers in the past year (date may be approximate).     Assessment:   This is a routine wellness examination for Kurt Kidd.  Hearing/Vision screen Vision Screening - Comments:: Annual eye exams wears glasses   Dietary issues and exercise activities discussed: Current Exercise Habits: Home exercise routine, Type of exercise: walking, Time (Minutes): 60, Frequency (Times/Week): 5, Weekly Exercise (Minutes/Week): 300, Intensity: Mild, Exercise limited by: None identified   Goals Addressed             This Visit's Progress    Patient Stated   On track    Maintain current health       Depression Screen PHQ 2/9 Scores 03/15/2021 10/17/2020 10/07/2019 02/16/2019 07/07/2013 06/26/2012  PHQ - 2 Score 0 0 0 0 0 0    Fall Risk Fall Risk  03/15/2021 10/17/2020 10/07/2019 05/18/2019 02/16/2019  Falls in the past year? 0 0 0 0 0  Number falls in past yr: 0 - 0 - -  Injury with Fall? 0 - 0 - -  Follow up Falls evaluation completed - Falls prevention discussed - -    FALL RISK PREVENTION PERTAINING TO THE HOME:  Any stairs in or around the home? Yes  If so, are there any without handrails? No  Home free of loose throw rugs in walkways, pet beds, electrical cords,  etc? Yes  Adequate lighting in your  home to reduce risk of falls? Yes   ASSISTIVE DEVICES UTILIZED TO PREVENT FALLS:  Life alert? No  Use of a cane, walker or w/c? No  Grab bars in the bathroom? Yes  Shower chair or bench in shower? Yes  Elevated toilet seat or a handicapped toilet? Yes   TIMED UP AND GO:  Was the test performed? Yes .  Length of time to ambulate 10 feet: 8 sec.   Gait steady and fast without use of assistive device  Cognitive Function:  Normal cognitive status assessed by direct observation by this Nurse Health Advisor. No abnormalities found.        Immunizations Immunization History  Administered Date(s) Administered   Fluad Quad(high Dose 65+) 02/16/2019   Influenza Split 12/26/2011   Influenza Whole 12/03/2008   Influenza, High Dose Seasonal PF 11/21/2015, 11/26/2016, 12/02/2017   Influenza,inj,Quad PF,6+ Mos 12/29/2012, 11/23/2013, 11/16/2014   PFIZER(Purple Top)SARS-COV-2 Vaccination 05/04/2019, 05/25/2019   Pneumococcal Conjugate-13 05/27/2017   Tdap 05/11/2014    TDAP status: Up to date  Flu Vaccine status: Due, Education has been provided regarding the importance of this vaccine. Advised may receive this vaccine at local pharmacy or Health Dept. Aware to provide a copy of the vaccination record if obtained from local pharmacy or Health Dept. Verbalized acceptance and understanding.  Pneumococcal vaccine status: Up to date  Covid-19 vaccine status: Completed vaccines  Qualifies for Shingles Vaccine? Yes   Zostavax completed No   Shingrix Completed?: No.    Education has been provided regarding the importance of this vaccine. Patient has been advised to call insurance company to determine out of pocket expense if they have not yet received this vaccine. Advised may also receive vaccine at local pharmacy or Health Dept. Verbalized acceptance and understanding.  Screening Tests Health Maintenance  Topic Date Due   FOOT EXAM  Never done   Hepatitis C Screening  Never done    Zoster Vaccines- Shingrix (1 of 2) Never done   Pneumonia Vaccine 26+ Years old (2 - PPSV23 if available, else PCV20) 05/28/2018   OPHTHALMOLOGY EXAM  12/15/2019   HEMOGLOBIN A1C  05/22/2020   INFLUENZA VACCINE  10/03/2020   URINE MICROALBUMIN  01/16/2022   TETANUS/TDAP  05/10/2024   COLONOSCOPY (Pts 45-42yrs Insurance coverage will need to be confirmed)  07/14/2026   HPV VACCINES  Aged Out   COVID-19 Vaccine  Discontinued    Health Maintenance  Health Maintenance Due  Topic Date Due   FOOT EXAM  Never done   Hepatitis C Screening  Never done   Zoster Vaccines- Shingrix (1 of 2) Never done   Pneumonia Vaccine 10+ Years old (2 - PPSV23 if available, else PCV20) 05/28/2018   OPHTHALMOLOGY EXAM  12/15/2019   HEMOGLOBIN A1C  05/22/2020   INFLUENZA VACCINE  10/03/2020    Colorectal cancer screening: Type of screening: Colonoscopy. Completed 07/13/2016. Repeat every 10 years  Lung Cancer Screening: (Low Dose CT Chest recommended if Age 35-80 years, 30 pack-year currently smoking OR have quit w/in 15years.) does not qualify.   Lung Cancer Screening Referral: n/a  Additional Screening:  Hepatitis C Screening: does qualify;   Vision Screening: Recommended annual ophthalmology exams for early detection of glaucoma and other disorders of the eye. Is the patient up to date with their annual eye exam?  Yes  Who is the provider or what is the name of the office in which the patient attends annual eye exams? [facility]  If pt is not established with a provider, would they like to be referred to a provider to establish care? No .   Dental Screening: Recommended annual dental exams for proper oral hygiene  Community Resource Referral / Chronic Care Management: CRR required this visit?  No   CCM required this visit?  No      Plan:     I have personally reviewed and noted the following in the patients chart:   Medical and social history Use of alcohol, tobacco or illicit drugs   Current medications and supplements including opioid prescriptions. Patient is not currently taking opioid prescriptions. Functional ability and status Nutritional status Physical activity Advanced directives List of other physicians Hospitalizations, surgeries, and ER visits in previous 12 months Vitals Screenings to include cognitive, depression, and falls Referrals and appointments  In addition, I have reviewed and discussed with patient certain preventive protocols, quality metrics, and best practice recommendations. A written personalized care plan for preventive services as well as general preventive health recommendations were provided to patient.     Randel Pigg, LPN   1/56/[id number]   Nurse Notes: none     Reviewed

## 2021-03-15 NOTE — Patient Instructions (Signed)
[name] , Thank you for taking time to come for your Medicare Wellness Visit. I appreciate your ongoing commitment to your health goals. Please review the following plan we discussed and let me know if I can assist you in the future.   Screening recommendations/referrals: Colonoscopy: 07/13/2016  due 2028 Recommended yearly ophthalmology/optometry visit for glaucoma screening and checkup Recommended yearly dental visit for hygiene and checkup  Vaccinations: Influenza vaccine: due  Pneumococcal vaccine: completed  Tdap vaccine: 05/11/2014 Shingles vaccine: will consider   Advanced directives: none   Conditions/risks identified: none   Next appointment: none   Preventive Care 73 Years and Older, Male Preventive care refers to lifestyle choices and visits with your health care provider that can promote health and wellness. What does preventive care include? A yearly physical exam. This is also called an annual well check. Dental exams once or twice a year. Routine eye exams. Ask your health care provider how often you should have your eyes checked. Personal lifestyle choices, including: Daily care of your teeth and gums. Regular physical activity. Eating a healthy diet. Avoiding tobacco and drug use. Limiting alcohol use. Practicing safe sex. Taking low doses of aspirin every day. Taking vitamin and mineral supplements as recommended by your health care provider. What happens during an annual well check? The services and screenings done by your health care provider during your annual well check will depend on your age, overall health, lifestyle risk factors, and family history of disease. Counseling  Your health care provider may ask you questions about your: Alcohol use. Tobacco use. Drug use. Emotional well-being. Home and relationship well-being. Sexual activity. Eating habits. History of falls. Memory and ability to understand (cognition). Work and work  Statistician. Screening  You may have the following tests or measurements: Height, weight, and BMI. Blood pressure. Lipid and cholesterol levels. These may be checked every 5 years, or more frequently if you are over 37 years old. Skin check. Lung cancer screening. You may have this screening every year starting at age 35 if you have a 30-pack-year history of smoking and currently smoke or have quit within the past 15 years. Fecal occult blood test (FOBT) of the stool. You may have this test every year starting at age 75. Flexible sigmoidoscopy or colonoscopy. You may have a sigmoidoscopy every 5 years or a colonoscopy every 10 years starting at age 67. Prostate cancer screening. Recommendations will vary depending on your family history and other risks. Hepatitis C blood test. Hepatitis B blood test. Sexually transmitted disease (STD) testing. Diabetes screening. This is done by checking your blood sugar (glucose) after you have not eaten for a while (fasting). You may have this done every 1-3 years. Abdominal aortic aneurysm (AAA) screening. You may need this if you are a current or former smoker. Osteoporosis. You may be screened starting at age 13 if you are at high risk. Talk with your health care provider about your test results, treatment options, and if necessary, the need for more tests. Vaccines  Your health care provider may recommend certain vaccines, such as: Influenza vaccine. This is recommended every year. Tetanus, diphtheria, and acellular pertussis (Tdap, Td) vaccine. You may need a Td booster every 10 years. Zoster vaccine. You may need this after age 48. Pneumococcal 13-valent conjugate (PCV13) vaccine. One dose is recommended after age 40. Pneumococcal polysaccharide (PPSV23) vaccine. One dose is recommended after age 7. Talk to your health care provider about which screenings and vaccines you need and how often  you need them. This information is not intended to replace  advice given to you by your health care provider. Make sure you discuss any questions you have with your health care provider. Document Released: 03/18/2015 Document Revised: 11/09/2015 Document Reviewed: 12/21/2014 Elsevier Interactive Patient Education  2017 [place] Prevention in the Home Falls can cause injuries. They can happen to people of all ages. There are many things you can do to make your home safe and to help prevent falls. What can I do on the outside of my home? Regularly fix the edges of walkways and driveways and fix any cracks. Remove anything that might make you trip as you walk through a door, such as a raised step or threshold. Trim any bushes or trees on the path to your home. Use bright outdoor lighting. Clear any walking paths of anything that might make someone trip, such as rocks or tools. Regularly check to see if handrails are loose or broken. Make sure that both sides of any steps have handrails. Any raised decks and porches should have guardrails on the edges. Have any leaves, snow, or ice cleared regularly. Use sand or salt on walking paths during winter. Clean up any spills in your garage right away. This includes oil or grease spills. What can I do in the bathroom? Use night lights. Install grab bars by the toilet and in the tub and shower. Do not use towel bars as grab bars. Use non-skid mats or decals in the tub or shower. If you need to sit down in the shower, use a plastic, non-slip stool. Keep the floor dry. Clean up any water that spills on the floor as soon as it happens. Remove soap buildup in the tub or shower regularly. Attach bath mats securely with double-sided non-slip rug tape. Do not have throw rugs and other things on the floor that can make you trip. What can I do in the bedroom? Use night lights. Make sure that you have a light by your bed that is easy to reach. Do not use any sheets or blankets that are too big for your bed.  They should not hang down onto the floor. Have a firm chair that has side arms. You can use this for support while you get dressed. Do not have throw rugs and other things on the floor that can make you trip. What can I do in the kitchen? Clean up any spills right away. Avoid walking on wet floors. Keep items that you use a lot in easy-to-reach places. If you need to reach something above you, use a strong step stool that has a grab bar. Keep electrical cords out of the way. Do not use floor polish or wax that makes floors slippery. If you must use wax, use non-skid floor wax. Do not have throw rugs and other things on the floor that can make you trip. What can I do with my stairs? Do not leave any items on the stairs. Make sure that there are handrails on both sides of the stairs and use them. Fix handrails that are broken or loose. Make sure that handrails are as long as the stairways. Check any carpeting to make sure that it is firmly attached to the stairs. Fix any carpet that is loose or worn. Avoid having throw rugs at the top or bottom of the stairs. If you do have throw rugs, attach them to the floor with carpet tape. Make sure that you have a light  switch at the top of the stairs and the bottom of the stairs. If you do not have them, ask someone to add them for you. What else can I do to help prevent falls? Wear shoes that: Do not have high heels. Have rubber bottoms. Are comfortable and fit you well. Are closed at the toe. Do not wear sandals. If you use a stepladder: Make sure that it is fully opened. Do not climb a closed stepladder. Make sure that both sides of the stepladder are locked into place. Ask someone to hold it for you, if possible. Clearly mark and make sure that you can see: Any grab bars or handrails. First and last steps. Where the edge of each step is. Use tools that help you move around (mobility aids) if they are needed. These  include: Canes. Walkers. Scooters. Crutches. Turn on the lights when you go into a dark area. Replace any light bulbs as soon as they burn out. Set up your furniture so you have a clear path. Avoid moving your furniture around. If any of your floors are uneven, fix them. If there are any pets around you, be aware of where they are. Review your medicines with your doctor. Some medicines can make you feel dizzy. This can increase your chance of falling. Ask your doctor what other things that you can do to help prevent falls. This information is not intended to replace advice given to you by your health care provider. Make sure you discuss any questions you have with your health care provider. Document Released: 12/16/2008 Document Revised: 07/28/2015 Document Reviewed: 03/26/2014 Elsevier Interactive Patient Education  2017 [employer].

## 2021-03-18 ENCOUNTER — Other Ambulatory Visit: Payer: Self-pay | Admitting: Family Medicine

## 2021-03-18 DIAGNOSIS — I10 Essential (primary) hypertension: Secondary | ICD-10-CM

## 2021-03-20 ENCOUNTER — Telehealth: Payer: Self-pay | Admitting: Family Medicine

## 2021-03-20 NOTE — Telephone Encounter (Signed)
Pt wife Jackelyn Poling called in stated pt is still having back pain would like a call to discuss next steps . Please Advise [phone]

## 2021-03-20 NOTE — Telephone Encounter (Signed)
I spoke with pt and he said he has been having back pain for 5 - 6 months. Pt said after appt with Dr Lorelei Pont 01/19/2021 he really did not improve but pt did not cb. Michela Pitcher that he is still having rt lower back pain that sometimes on and off pain runs down the rt leg. Pt not having tingling or numbness. Pt has been taking muscle relaxant and tylenol to manage the pain. Pt said right now he is not having pain but took tylenol couple of hrs ago. Pt said the back pain is usually worse when he first gets up in the mornings. Pt is also concerned because a good friend of pt just recently deceased due to spine and bone CA. Pt wants to find out the cause of this back pain. Offered pt an appt but pt said he appreciates Dr Lorelei Pont but pt already has appt with PCP on 03/30/21 and pt will wait to see him. Pt said was already given UC & ED precautions by Dr Bebe Shaggy office. Sending note to Dr Lorelei Pont and Butch Penny CMA.

## 2021-03-20 NOTE — Telephone Encounter (Signed)
I agree that additional evaluation is reasonable and appropriate.

## 2021-03-20 NOTE — Telephone Encounter (Signed)
Appointment scheduled patient and wife aware if this pain continues they may need to to hospital or urgent care prior to scheduled appointment on 03/30/21

## 2021-03-20 NOTE — Telephone Encounter (Signed)
Pts wife called saying Kurt Kidd' back has not gotten any better.

## 2021-03-20 NOTE — Telephone Encounter (Signed)
Rena,  Please triage.

## 2021-03-30 ENCOUNTER — Encounter: Payer: Self-pay | Admitting: Family Medicine

## 2021-03-30 ENCOUNTER — Ambulatory Visit (INDEPENDENT_AMBULATORY_CARE_PROVIDER_SITE_OTHER): Payer: Medicare Other | Admitting: Family Medicine

## 2021-03-30 ENCOUNTER — Other Ambulatory Visit: Payer: Self-pay

## 2021-03-30 VITALS — BP 122/70 | HR 77 | Temp 97.8°F | Ht 66.0 in | Wt 185.8 lb

## 2021-03-30 DIAGNOSIS — R109 Unspecified abdominal pain: Secondary | ICD-10-CM | POA: Diagnosis not present

## 2021-03-30 DIAGNOSIS — Z23 Encounter for immunization: Secondary | ICD-10-CM | POA: Insufficient documentation

## 2021-03-30 DIAGNOSIS — E119 Type 2 diabetes mellitus without complications: Secondary | ICD-10-CM

## 2021-03-30 DIAGNOSIS — M545 Low back pain, unspecified: Secondary | ICD-10-CM

## 2021-03-30 DIAGNOSIS — R809 Proteinuria, unspecified: Secondary | ICD-10-CM

## 2021-03-30 DIAGNOSIS — Z1159 Encounter for screening for other viral diseases: Secondary | ICD-10-CM

## 2021-03-30 LAB — URINALYSIS, ROUTINE W REFLEX MICROSCOPIC
Bilirubin Urine: NEGATIVE
Hgb urine dipstick: NEGATIVE
Ketones, ur: NEGATIVE
Leukocytes,Ua: NEGATIVE
Nitrite: NEGATIVE
RBC / HPF: NONE SEEN (ref 0–?)
Specific Gravity, Urine: 1.025 (ref 1.000–1.030)
Total Protein, Urine: 30 — AB
Urine Glucose: NEGATIVE
Urobilinogen, UA: 0.2 (ref 0.0–1.0)
pH: 6 (ref 5.0–8.0)

## 2021-03-30 LAB — CBC
HCT: 42.6 % (ref 39.0–52.0)
Hemoglobin: 14.5 g/dL (ref 13.0–17.0)
MCHC: 34.2 g/dL (ref 30.0–36.0)
MCV: 86.1 fl (ref 78.0–100.0)
Platelets: 299 10*3/uL (ref 150.0–400.0)
RBC: 4.94 Mil/uL (ref 4.22–5.81)
RDW: 14.1 % (ref 11.5–15.5)
WBC: 7 10*3/uL (ref 4.0–10.5)

## 2021-03-30 LAB — COMPREHENSIVE METABOLIC PANEL
ALT: 28 U/L (ref 0–53)
AST: 21 U/L (ref 0–37)
Albumin: 4.7 g/dL (ref 3.5–5.2)
Alkaline Phosphatase: 62 U/L (ref 39–117)
BUN: 15 mg/dL (ref 6–23)
CO2: 28 mEq/L (ref 19–32)
Calcium: 10.1 mg/dL (ref 8.4–10.5)
Chloride: 102 mEq/L (ref 96–112)
Creatinine, Ser: 0.69 mg/dL (ref 0.40–1.50)
GFR: 92.55 mL/min (ref >60.00)
Glucose, Bld: 142 mg/dL — ABNORMAL HIGH (ref 70–99)
Potassium: 4.3 mEq/L (ref 3.5–5.1)
Sodium: 138 mEq/L (ref 135–145)
Total Bilirubin: 0.6 mg/dL (ref 0.2–1.2)
Total Protein: 7.4 g/dL (ref 6.0–8.3)

## 2021-03-30 LAB — HEMOGLOBIN A1C: Hgb A1c MFr Bld: 6.9 % — ABNORMAL HIGH (ref 4.6–6.5)

## 2021-03-30 LAB — MICROALBUMIN / CREATININE URINE RATIO
Creatinine,U: 152.7 mg/dL
Microalb Creat Ratio: 11.2 mg/g (ref 0.0–30.0)
Microalb, Ur: 17.1 mg/dL — ABNORMAL HIGH (ref 0.0–1.9)

## 2021-03-30 NOTE — Progress Notes (Signed)
Established Patient Office Visit  Subjective:  Patient ID: Kurt Kidd, male    DOB: 02-Oct-1950  Age: 71 y.o. MRN: [id number]  CC:  Chief Complaint  Patient presents with   Pain    Still having lots of back and right side pains noting helping would like MRI.     HPI Kurt Kidd presents for evaluation of a 19-monthhistory of ongoing right flank pain.  Pain is been intermittent and nonprogressive.  He has had no changes in bowel function.  Denies hematochezia or melena.  Ongoing history of BPH treated with tamsulosin.  Urine flow is stable with this medication.  Denies nausea or vomiting.  Denies weight loss or night sweats.  Up-to-date with colonoscopy.  Ongoing history of lower back pain.  It is nonradiating.  Back is stiff each morning.  Patient is taking Tylenol and a muscle relaxer.  Past Medical History:  Diagnosis Date   CAD (coronary artery disease)    DES.. 2004 /  catheterization 2008, 10% narrowing stent site, pain not cardiac   [place]    Colon polyp    Degenerative joint disease    Diabetes mellitus 2012   Diverticulosis    Dyslipidemia    Ejection fraction    65-70%, echo, March, 2012   GERD (gastroesophageal reflux disease)    Hyperlipidemia    Hypertension    Mitral regurgitation    Echo, mild, 2012   Murmur    Onychomycosis    Overweight(278.02)    Palpitations    Rapid heartbeat and shortness of breath after eating 2008 recurrent episode 2012    Past Surgical History:  Procedure Laterality Date   ANGIOPLASTY / STENTING FEMORAL  09/09/2002   CARDIAC CATHETERIZATION     COLONOSCOPY  2014   Dr BBary Castilla  COLONOSCOPY  11/26/2007   Dr Patterson/POLYPOID COLONIC MUCOSA WITH HYPEREMIA AND   COLONOSCOPY WITH PROPOFOL N/A 07/13/2016   Procedure: COLONOSCOPY WITH PROPOFOL;  Surgeon: BRobert Bellow MD;  Location: [facility];  Service: Endoscopy;  Laterality: N/A;   ESOPHAGOGASTRODUODENOSCOPY (EGD) WITH PROPOFOL N/A 07/13/2016    Procedure: ESOPHAGOGASTRODUODENOSCOPY (EGD) WITH PROPOFOL;  Surgeon: BRobert Bellow MD;  Location: [facility];  Service: Endoscopy;  Laterality: N/A;   TOTAL ANKLE ARTHROPLASTY Left 02/25/2020   Procedure: TOTAL ANKLE ARTHROPLASTY;  Surgeon: HWylene Simmer MD;  [place]  Service: Orthopedics;  Laterality: Left;   UPPER GI ENDOSCOPY  11/26/2007   Dr PSharlett Iles   Family History  Problem Relation Age of Onset   Heart failure Mother    Heart disease Mother    Diabetes Father        father also had ASHD/ CABG   Heart disease Father    Colon cancer Brother 637  AAA (abdominal aortic aneurysm) Brother 776   Social History   Socioeconomic History   Marital status: Married    Spouse name: Not on file   Number of children: Not on file   Years of education: Not on file   Highest education level: Not on file  Occupational History   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 Tobacco Use   Smoking status: Former    Packs/day: 1.00    Years: 26.00    Pack years: 26.00    Types: Cigarettes    Quit date: 03/05/1990    Years since quitting: 31.0   Smokeless tobacco: Never  Substance and Sexual Activity  Alcohol use: Yes    Alcohol/week: 0.0 standard drinks    Comment: 2 drinks per day   Drug use: No   Sexual activity: Not on file  Other Topics Concern   Not on file  Social History Narrative   Not on file   Social Determinants of Health   Financial Resource Strain: Low Risk    Difficulty of Paying Living Expenses: Not hard at all  Food Insecurity: No Food Insecurity   Worried About Charity fundraiser in the Last Year: Never true   [place] in the Last Year: Never true  Transportation Needs: No Transportation Needs   Lack of Transportation (Medical): No   Lack of Transportation (Non-Medical): No  Physical Activity: Sufficiently Active   Days of Exercise per Week: 5 days   Minutes of Exercise per Session: 60 min   Stress: No Stress Concern Present   Feeling of Stress : Not at all  Social Connections: Moderately Isolated   Frequency of Communication with Friends and Family: Twice a week   Frequency of Social Gatherings with Friends and Family: Twice a week   Attends Religious Services: Never   Marine scientist or Organizations: No   Attends Music therapist: Never   Marital Status: Married  Human resources officer Violence: Not At Risk   Fear of Current or Ex-Partner: No   Emotionally Abused: No   Physically Abused: No   Sexually Abused: No    Outpatient Medications Prior to Visit  Medication Sig Dispense Refill   atorvastatin (LIPITOR) 80 MG tablet Take 1 tablet by mouth once daily 90 tablet 0   Blood Glucose Monitoring Suppl (ONE TOUCH ULTRA SYSTEM KIT) W/DEVICE KIT 1 kit by Does not apply route once. 1 each 0   esomeprazole (NEXIUM) 20 MG capsule Take 20 mg by mouth daily at 12 noon.     ezetimibe (ZETIA) 10 MG tablet TAKE 1 TABLET BY MOUTH ONCE DAILY(CALL TO SCHEDULE A FOLLOW UP APPT) 90 tablet 0   metFORMIN (GLUCOPHAGE) 500 MG tablet TAKE 1 TABLET BY MOUTH THREE TIMES DAILY 270 tablet 0   methocarbamol (ROBAXIN) 750 MG tablet Take 1 tablet (750 mg total) by mouth 3 (three) times daily as needed. 60 tablet 0   metoprolol tartrate (LOPRESSOR) 50 MG tablet Take 1 tablet by mouth twice daily 180 tablet 0   Multiple Vitamins-Minerals (MULTIVITAMIN WITH MINERALS) tablet Take 1 tablet by mouth daily.     nitroGLYCERIN (NITROSTAT) 0.4 MG SL tablet Place 1 tablet (0.4 mg total) under the tongue every 5 (five) minutes as needed. 25 tablet 1   rivaroxaban (XARELTO) 10 MG TABS tablet Take 1 tablet (10 mg total) by mouth daily. 14 tablet 0   tamsulosin (FLOMAX) 0.4 MG CAPS capsule Take 1 capsule by mouth once daily 90 capsule 0   traMADol (ULTRAM) 50 MG tablet TAKE 1 TABLET BY MOUTH AT NIGHT AS NEEDED 30 tablet 0   predniSONE (DELTASONE) 20 MG tablet 2 tabs po for 7 days, then 1 tab po for 7  days (Patient not taking: Reported on 03/15/2021) 21 tablet 0   No facility-administered medications prior to visit.    No Known Allergies  ROS Review of Systems  Constitutional:  Negative for diaphoresis, fatigue, fever and unexpected weight change.  HENT: Negative.    Eyes:  Negative for photophobia and visual disturbance.  Respiratory: Negative.  Negative for shortness of breath and wheezing.   Cardiovascular: Negative.  Negative for chest  pain and palpitations.  Gastrointestinal:  Positive for abdominal pain. Negative for abdominal distention, anal bleeding, blood in stool, constipation, diarrhea, nausea and vomiting.  Endocrine: Negative for polyphagia and polyuria.  Genitourinary:  Negative for decreased urine volume, difficulty urinating, dysuria, frequency, hematuria and urgency.  Musculoskeletal:  Positive for back pain and myalgias.  Neurological:  Negative for weakness and numbness.     Objective:    Physical Exam Vitals and nursing note reviewed.  Constitutional:      General: He is not in acute distress.    Appearance: Normal appearance. He is not ill-appearing, toxic-appearing or diaphoretic.  HENT:     Head: Normocephalic and atraumatic.     Right Ear: External ear normal.     Left Ear: External ear normal.  Eyes:     General: No scleral icterus.       Right eye: No discharge.        Left eye: No discharge.     Extraocular Movements: Extraocular movements intact.     Conjunctiva/sclera: Conjunctivae normal.  Cardiovascular:     Rate and Rhythm: Normal rate and regular rhythm.  Pulmonary:     Effort: Pulmonary effort is normal.     Breath sounds: Normal breath sounds.  Abdominal:     General: Bowel sounds are normal. There is no distension.     Palpations: Abdomen is soft. There is no mass.     Tenderness: There is no abdominal tenderness. There is no right CVA tenderness, left CVA tenderness, guarding or rebound.     Hernia: No hernia is present.   Musculoskeletal:     Cervical back: No rigidity or tenderness.     Lumbar back: No spasms, tenderness or bony tenderness. Decreased range of motion. Negative right straight leg raise test and negative left straight leg raise test.  Lymphadenopathy:     Cervical: No cervical adenopathy.  Skin:    General: Skin is warm and dry.  Neurological:     Mental Status: He is alert and oriented to person, place, and time.     Cranial Nerves: No dysarthria or facial asymmetry.     Motor: No weakness.     Deep Tendon Reflexes:     Reflex Scores:      Patellar reflexes are 2+ on the right side and 2+ on the left side.      Achilles reflexes are 1+ on the right side and 1+ on the left side. Psychiatric:        Mood and Affect: Mood normal.        Behavior: Behavior normal.    BP 122/70 (BP Location: Right Arm, Patient Position: Sitting, Cuff Size: Normal)    Pulse 77    Temp 97.8 F (36.6 C) (Temporal)    Ht 5' 6"  (1.676 m)    Wt 185 lb 12.8 oz (84.3 kg)    SpO2 94%    BMI 29.99 kg/m  Wt Readings from Last 3 Encounters:  03/30/21 185 lb 12.8 oz (84.3 kg)  03/15/21 186 lb (84.4 kg)  01/19/21 184 lb 2 oz (83.5 kg)     Health Maintenance Due  Topic Date Due   FOOT EXAM  Never done   Hepatitis C Screening  Never done   Zoster Vaccines- Shingrix (1 of 2) Never done   Pneumonia Vaccine 98+ Years old (2 - PPSV23 if available, else PCV20) 05/28/2018   OPHTHALMOLOGY EXAM  12/15/2019   HEMOGLOBIN A1C  05/22/2020    There are  no preventive care reminders to display for this patient.  Lab Results  Component Value Date   TSH 1.95 05/27/2017   Lab Results  Component Value Date   WBC 7.1 10/17/2020   HGB 13.6 10/17/2020   HCT 39.5 10/17/2020   MCV 85.9 10/17/2020   PLT 268.0 10/17/2020   Lab Results  Component Value Date   NA 135 01/16/2021   K 4.2 01/16/2021   CO2 30 01/16/2021   GLUCOSE 278 (H) 01/16/2021   BUN 16 01/16/2021   CREATININE 0.80 01/16/2021   BILITOT 0.7 10/17/2020    ALKPHOS 82 10/17/2020   AST 21 10/17/2020   ALT 26 10/17/2020   PROT 6.5 10/17/2020   ALBUMIN 4.4 10/17/2020   CALCIUM 9.7 01/16/2021   ANIONGAP 10 02/22/2020   GFR 88.63 01/16/2021   Lab Results  Component Value Date   CHOL 149 10/17/2020   Lab Results  Component Value Date   HDL 54.20 10/17/2020   Lab Results  Component Value Date   LDLCALC 74 10/17/2020   Lab Results  Component Value Date   TRIG 104.0 10/17/2020   Lab Results  Component Value Date   CHOLHDL 3 10/17/2020   Lab Results  Component Value Date   HGBA1C 7.3 (H) 11/23/2019      Assessment & Plan:   Problem List Items Addressed This Visit       Endocrine   Diabetes mellitus type 2, noninsulin dependent ([place])   Relevant Orders   Comprehensive metabolic panel   Hemoglobin A1c   Urinalysis, Routine w reflex microscopic   Microalbumin / creatinine urine ratio   Ambulatory referral to Ophthalmology     Other   Low back pain of over 3 months duration   Relevant Orders   MR Lumbar Spine Wo Contrast   Need for influenza vaccination - Primary   Relevant Orders   Flu Vaccine QUAD High Dose(Fluad) (Completed)   Microalbuminuria   Right flank pain   Relevant Orders   CBC   Comprehensive metabolic panel   CT Abdomen Pelvis W Contrast   CT Abdomen Pelvis Wo Contrast   Need for vaccination against Streptococcus pneumoniae   Relevant Orders   Pneumococcal conjugate vaccine 20-valent (Prevnar 20)   Encounter for hepatitis C screening test for low risk patient   Relevant Orders   Hepatitis C antibody    No orders of the defined types were placed in this encounter.   Follow-up: Return in about 3 months (around 06/28/2021).  With ongoing history of lower back pain and right flank pain we will send for MRI of lower back and CT of abdomen and pelvis for flank pain.  Advised follow-up with sports medicine.  Okay him here comes Mr. Dimare is  Kurt Maw, MD

## 2021-03-31 LAB — HEPATITIS C ANTIBODY
Hepatitis C Ab: NONREACTIVE
SIGNAL TO CUT-OFF: <0.02 (ref <1.00)

## 2021-04-08 ENCOUNTER — Other Ambulatory Visit: Payer: Self-pay

## 2021-04-08 ENCOUNTER — Ambulatory Visit (HOSPITAL_BASED_OUTPATIENT_CLINIC_OR_DEPARTMENT_OTHER)
Admission: RE | Admit: 2021-04-08 | Discharge: 2021-04-08 | Disposition: A | Discharge status: Home / Self Care | Payer: Medicare Other | Source: Ambulatory Visit | Attending: Family Medicine | Admitting: Family Medicine

## 2021-04-08 DIAGNOSIS — M545 Low back pain, unspecified: Secondary | ICD-10-CM | POA: Insufficient documentation

## 2021-04-08 DIAGNOSIS — M48061 Spinal stenosis, lumbar region without neurogenic claudication: Secondary | ICD-10-CM | POA: Diagnosis not present

## 2021-04-08 DIAGNOSIS — M4316 Spondylolisthesis, lumbar region: Secondary | ICD-10-CM | POA: Diagnosis not present

## 2021-04-17 ENCOUNTER — Telehealth (HOSPITAL_BASED_OUTPATIENT_CLINIC_OR_DEPARTMENT_OTHER): Payer: Self-pay

## 2021-04-22 ENCOUNTER — Telehealth (HOSPITAL_BASED_OUTPATIENT_CLINIC_OR_DEPARTMENT_OTHER): Payer: Self-pay

## 2021-04-24 ENCOUNTER — Ambulatory Visit: Payer: Medicare Other | Admitting: Family Medicine

## 2021-04-26 ENCOUNTER — Other Ambulatory Visit: Payer: Self-pay | Admitting: Family Medicine

## 2021-04-26 ENCOUNTER — Other Ambulatory Visit: Payer: Self-pay

## 2021-04-26 ENCOUNTER — Ambulatory Visit (HOSPITAL_COMMUNITY)
Admission: RE | Admit: 2021-04-26 | Discharge: 2021-04-26 | Disposition: A | Discharge status: Home / Self Care | Payer: Medicare Other | Source: Ambulatory Visit | Attending: Cardiovascular Disease | Admitting: Cardiovascular Disease

## 2021-04-26 ENCOUNTER — Telehealth: Payer: Self-pay | Admitting: Family Medicine

## 2021-04-26 DIAGNOSIS — I6523 Occlusion and stenosis of bilateral carotid arteries: Secondary | ICD-10-CM | POA: Insufficient documentation

## 2021-04-26 DIAGNOSIS — N401 Enlarged prostate with lower urinary tract symptoms: Secondary | ICD-10-CM

## 2021-04-26 DIAGNOSIS — R3911 Hesitancy of micturition: Secondary | ICD-10-CM

## 2021-04-26 NOTE — Telephone Encounter (Signed)
Pt's wife(Debbie) called in wanting Dr. Ethelene Hal to know her husband is not able to schedule his CT Abdomen Pelvis Wo Contrast (Order [id number]) appointment.  They are telling him he needs labs done before they can let him schedule. Please advise at [phone].

## 2021-04-28 ENCOUNTER — Other Ambulatory Visit: Payer: Self-pay | Admitting: Internal Medicine

## 2021-04-28 DIAGNOSIS — I6523 Occlusion and stenosis of bilateral carotid arteries: Secondary | ICD-10-CM

## 2021-05-09 ENCOUNTER — Other Ambulatory Visit: Payer: Self-pay

## 2021-05-09 ENCOUNTER — Encounter: Payer: Self-pay | Admitting: Family Medicine

## 2021-05-09 ENCOUNTER — Ambulatory Visit (INDEPENDENT_AMBULATORY_CARE_PROVIDER_SITE_OTHER): Payer: Medicare Other | Admitting: Internal Medicine

## 2021-05-09 ENCOUNTER — Encounter: Payer: Self-pay | Admitting: Internal Medicine

## 2021-05-09 VITALS — BP 130/62 | HR 70 | Ht 66.0 in | Wt 183.2 lb

## 2021-05-09 DIAGNOSIS — I10 Essential (primary) hypertension: Secondary | ICD-10-CM

## 2021-05-09 DIAGNOSIS — I2510 Atherosclerotic heart disease of native coronary artery without angina pectoris: Secondary | ICD-10-CM

## 2021-05-09 DIAGNOSIS — E785 Hyperlipidemia, unspecified: Secondary | ICD-10-CM

## 2021-05-09 DIAGNOSIS — I6523 Occlusion and stenosis of bilateral carotid arteries: Secondary | ICD-10-CM

## 2021-05-09 NOTE — Progress Notes (Signed)
OFFICE NOTE  Chief Complaint:  No complaints  Primary Care Physician: Libby Maw, MD  HPI:  Kurt Kidd is a 71 y.o. male with a past medial history significant for coronary artery disease status post Cypher DES in 2004 to the LAD. He underwent repeat cardiac catheterization in 2008 showing 10% in-stent restenosis and 40% proximal circumflex stenosis as well as 40% proximal RCA stenosis with normal LV function. He also has dyslipidemia and strong family history of premature coronary artery disease in both parents. He reports that his father actually had a carotid endarterectomy as well. Recently he was found to have a left carotid bruit which I also heard on exam today. In September 2017 he was found to have moderate left carotid artery stenosis and no significant stenosis in the right. He is a former smoker and has not had a screening abdominal ultrasound for aneurysm. He did have ultrasound of the abdomen in 2010 which showed no significant aneurysm at the time. Recent lab work was performed 2 weeks ago showing total cholesterol 166, HDL-C 50, LDL-C 99 and triglycerides 82. Non-HDL cholesterol of 115. Goal for aggressive therapy would be to lower the LDL-C less than 70 and non-HDL less than 100. I discussed that with him today. Overall he reports being asymptomatic. He denies any chest pain or worsening shortness of breath. 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  07/16/2016  Kurt Kidd returns today for follow-up of his studies. He underwent abdominal aortic aneurysm screening Doppler which was negative for aneurysm (based on family history of AAA). He does have a history of moderate left internal carotid artery stenosis and has pending carotid Doppler scheduled for 07/20/2016. He's had no other symptoms of coronary disease. Cholesterol was not at goal and I started him on ezetimibe. He is due for  repeat lipid profile in one month.  02/19/2018  Kurt Kidd is seen today in follow-up.  He underwent carotid Dopplers recently which shows moderate bilateral carotid artery stenosis.  We had added ezetimibe and recently his labs have improved a total cholesterol 135, triglycerides 126, HDL 46 and LDL 61.  This now puts him at goal.  He is really asymptomatic.  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  That being said he does have a harsh mitral murmur.  He was known to have longstanding mitral valve disease and last had an echo in 2015 which showed moderate mitral regurgitation.  04/13/2019  Kurt Kidd is seen today in follow-up.  He continues to do well.  He denies any chest pain or shortness of breath.  Overall he feels well.  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  Blood pressure is initially elevated 150/82 however came down to 130/68.  He did have moderate to severe carotid stenosis on his last Dopplers in September.  I recommended following him every 6 months so he will be due for them again this month.  01/19/2020  Kurt Kidd is seen today for preoperative clearance.  He continues to struggle with issues with left ankle pain.  He apparently has bone-on-bone and will need left ankle replacement.  He said he had trauma to that in his 59s and it has become significantly worse since that surgery.  From a cardiac standpoint  he is very active.  He does well more than 4 metabolic levels of activity and denies chest pain or worsening shortness of breath.  We continue to follow carotid Dopplers which showed moderate to severe carotid artery stenosis in the left.  He is due for repeat Dopplers in February 2021.  05/09/2021  Kurt Kidd returns today for follow-up.  Overall he is doing well.  He had a successful ankle replacement.  He continues to work in his truck [employer].  He denies  any chest pain or worsening shortness of breath.  He had repeat carotid Dopplers which showed stable moderate left carotid artery stenosis and mild on the right.  I think it is really unchanged over the past several years.  We continue to repeat this annually.  He had no chest pain symptoms.  His lipids have been good with LDL around 70.  Blood pressure is well controlled today.  EKG is unchanged and shows a PVC but otherwise sinus rhythm.  PMHx:  Past Medical History:  Diagnosis Date   CAD (coronary artery disease)    DES.. 2004 /  catheterization 2008, 10% narrowing stent site, pain not cardiac   Colitis, ischemic ([place])    Colon polyp    Degenerative joint disease    Diabetes mellitus 2012   Diverticulosis    Dyslipidemia    Ejection fraction    65-70%, echo, March, 2012   GERD (gastroesophageal reflux disease)    Hyperlipidemia    Hypertension    Mitral regurgitation    Echo, mild, 2012   Murmur    Onychomycosis    Overweight(278.02)    Palpitations    Rapid heartbeat and shortness of breath after eating 2008 recurrent episode 2012    Past Surgical History:  Procedure Laterality Date   ANGIOPLASTY / STENTING FEMORAL  09/09/2002   CARDIAC CATHETERIZATION     COLONOSCOPY  2014   Dr Bary Castilla   COLONOSCOPY  11/26/2007   Dr Patterson/POLYPOID COLONIC MUCOSA WITH HYPEREMIA AND   COLONOSCOPY WITH PROPOFOL N/A 07/13/2016   Procedure: COLONOSCOPY WITH PROPOFOL;  Surgeon: Robert Bellow, MD;  Location: [facility];  Service: Endoscopy;  Laterality: N/A;   ESOPHAGOGASTRODUODENOSCOPY (EGD) WITH PROPOFOL N/A 07/13/2016   Procedure: ESOPHAGOGASTRODUODENOSCOPY (EGD) WITH PROPOFOL;  Surgeon: Robert Bellow, MD;  Location: [facility];  Service: Endoscopy;  Laterality: N/A;   TOTAL ANKLE ARTHROPLASTY Left 02/25/2020   Procedure: TOTAL ANKLE ARTHROPLASTY;  Surgeon: Wylene Simmer, MD;  Location: [place];  Service: Orthopedics;  Laterality: Left;   UPPER GI  ENDOSCOPY  11/26/2007   Dr Sharlett Iles    FAMHx:  Family History  Problem Relation Age of Onset   Heart failure Mother    Heart disease Mother    Diabetes Father        father also had ASHD/ CABG   Heart disease Father    Colon cancer Brother 87   AAA (abdominal aortic aneurysm) Brother 85    SOCHx:   reports that he quit smoking about 31 years ago. His smoking use included cigarettes. He has a 26.00 pack-year smoking history. He has never used smokeless tobacco. He reports current alcohol use. He reports that he does not use drugs.  ALLERGIES:  No Known Allergies  ROS: Pertinent items noted in HPI and remainder of comprehensive ROS otherwise negative.  HOME MEDS: Current Outpatient Medications on File Prior to Visit  Medication Sig Dispense Refill   atorvastatin (LIPITOR) 80 MG tablet Take 1 tablet by  mouth once daily 90 tablet 0   Blood Glucose Monitoring Suppl (ONE TOUCH ULTRA SYSTEM KIT) W/DEVICE KIT 1 kit by Does not apply route once. 1 each 0   esomeprazole (NEXIUM) 20 MG capsule Take 20 mg by mouth daily at 12 noon.     ezetimibe (ZETIA) 10 MG tablet TAKE 1 TABLET BY MOUTH ONCE DAILY(CALL TO SCHEDULE A FOLLOW UP APPT) 90 tablet 0   metFORMIN (GLUCOPHAGE) 500 MG tablet TAKE 1 TABLET BY MOUTH THREE TIMES DAILY 270 tablet 0   metoprolol tartrate (LOPRESSOR) 50 MG tablet Take 1 tablet by mouth twice daily 180 tablet 0   Multiple Vitamins-Minerals (MULTIVITAMIN WITH MINERALS) tablet Take 1 tablet by mouth daily.     rivaroxaban (XARELTO) 10 MG TABS tablet Take 1 tablet (10 mg total) by mouth daily. 14 tablet 0   tamsulosin (FLOMAX) 0.4 MG CAPS capsule Take 1 capsule by mouth once daily 90 capsule 0   methocarbamol (ROBAXIN) 750 MG tablet Take 1 tablet (750 mg total) by mouth 3 (three) times daily as needed. (Patient not taking: Reported on 05/09/2021) 60 tablet 0   nitroGLYCERIN (NITROSTAT) 0.4 MG SL tablet Place 1 tablet (0.4 mg total) under the tongue every 5 (five) minutes as  needed. (Patient not taking: Reported on 05/09/2021) 25 tablet 1   traMADol (ULTRAM) 50 MG tablet TAKE 1 TABLET BY MOUTH AT NIGHT AS NEEDED (Patient not taking: Reported on 05/09/2021) 30 tablet 0   No current facility-administered medications on file prior to visit.    LABS/IMAGING: No results found for this or any previous visit (from the past 48 hour(s)). No results found.  WEIGHTS: Wt Readings from Last 3 Encounters:  05/09/21 183 lb 3.2 oz (83.1 kg)  03/30/21 185 lb 12.8 oz (84.3 kg)  03/15/21 186 lb (84.4 kg)    VITALS: BP 130/62    Pulse 70    Ht 5' 6"  (1.676 m)    Wt 183 lb 3.2 oz (83.1 kg)    SpO2 95%    BMI 29.57 kg/m   EXAM: General appearance: alert and no distress Neck: no carotid bruit, no JVD and thyroid not enlarged, symmetric, no tenderness/mass/nodules Lungs: clear to auscultation bilaterally Heart: regular rate and rhythm and systolic murmur: holosystolic 3/6, blowing at apex Abdomen: soft, non-tender; bowel sounds normal; no masses,  no organomegaly Extremities: extremities normal, atraumatic, no cyanosis or edema Pulses: 2+ and symmetric Skin: Skin color, texture, turgor normal. No rashes or lesions Neurologic: Grossly normal Psych: Pleasant  EKG: Normal sinus rhythm at 70, PVC-personally reviewed  ASSESSMENT: CAD status post Cypher DES to the LAD in 2004, 10% in-stent restenosis in 2008 - moderate proximal LCX and RCA disease at the time Dyslipidemia Moderate left internal carotid artery stenosis Former smoker Family history of AAA - no AAA by ultrasound 07/2016 DM2 Moderate mitral regurgitation  PLAN: 1.   Kurt Kidd continues to do well without any chest pain or worsening shortness of breath.  His carotid artery stenosis is stable.  LDL is around target at 70.  He denies any worsening shortness of breath or chest pain and is very physically active.  No changes to his meds today.  Follow-up with me annually or sooner as necessary.  Pixie Casino,  MD, [facility], [place] Director of the Advanced Lipid Disorders &  Cardiovascular Risk Reduction Clinic Diplomate of the American Board of Clinical Lipidology Attending Cardiologist  Direct Dial: [phone]   Fax:  [phone]  Website:  [email or web address] 05/09/2021, 8:30 AM

## 2021-05-09 NOTE — Patient Instructions (Signed)
Medication Instructions:  ?NO CHANGES ? ?*If you need a refill on your cardiac medications before your next appointment, please call your pharmacy* ? ? ?Testing/Procedures: ?Carotid Doppler in 1 year ? ? ?Follow-Up: ?At [facility], you and your health needs are our priority.  As part of our continuing mission to provide you with exceptional heart care, we have created designated Provider Care Teams.  These Care Teams include your primary Cardiologist (physician) and Advanced Practice Providers (APPs -  Physician Assistants and Nurse Practitioners) who all work together to provide you with the care you need, when you need it. ? ?We recommend signing up for the patient portal called "MyChart".  Sign up information is provided on this After Visit Summary.  MyChart is used to connect with patients for Virtual Visits (Telemedicine).  Patients are able to view lab/test results, encounter notes, upcoming appointments, etc.  Non-urgent messages can be sent to your provider as well.   ?To learn more about what you can do with MyChart, go to [email or web address].   ? ?Your next appointment:   ?12 month(s) ? ?The format for your next appointment:   ?In Person ? ?Provider:   ?Pixie Casino, MD { ? ? ?

## 2021-05-11 NOTE — Telephone Encounter (Signed)
Called and spoke to renee from [facility]. Provider does not have option to combined orders. Okay to continue with orders submitted. Rep voiced understanding. Sw, cma ?

## 2021-05-11 NOTE — Addendum Note (Signed)
Addended by: Jon Billings on: 05/11/2021 08:02 AM   Modules accepted: Orders

## 2021-05-15 ENCOUNTER — Other Ambulatory Visit: Payer: Self-pay

## 2021-05-16 ENCOUNTER — Other Ambulatory Visit: Payer: Self-pay

## 2021-05-16 ENCOUNTER — Other Ambulatory Visit (INDEPENDENT_AMBULATORY_CARE_PROVIDER_SITE_OTHER): Payer: Medicare Other

## 2021-05-16 DIAGNOSIS — R109 Unspecified abdominal pain: Secondary | ICD-10-CM | POA: Diagnosis not present

## 2021-05-16 LAB — BASIC METABOLIC PANEL
BUN: 13 mg/dL (ref 6–23)
CO2: 30 mEq/L (ref 19–32)
Calcium: 9.8 mg/dL (ref 8.4–10.5)
Chloride: 102 mEq/L (ref 96–112)
Creatinine, Ser: 0.66 mg/dL (ref 0.40–1.50)
GFR: 93.72 mL/min (ref >60.00)
Glucose, Bld: 148 mg/dL — ABNORMAL HIGH (ref 70–99)
Potassium: 4.9 mEq/L (ref 3.5–5.1)
Sodium: 138 mEq/L (ref 135–145)

## 2021-05-17 NOTE — Telephone Encounter (Signed)
Labs completed, patient scheduled for CT ?

## 2021-05-18 ENCOUNTER — Encounter: Payer: Self-pay | Admitting: Family Medicine

## 2021-05-18 DIAGNOSIS — H52203 Unspecified astigmatism, bilateral: Secondary | ICD-10-CM | POA: Diagnosis not present

## 2021-05-18 DIAGNOSIS — H2513 Age-related nuclear cataract, bilateral: Secondary | ICD-10-CM | POA: Diagnosis not present

## 2021-05-18 DIAGNOSIS — H35373 Puckering of macula, bilateral: Secondary | ICD-10-CM | POA: Diagnosis not present

## 2021-05-18 DIAGNOSIS — E119 Type 2 diabetes mellitus without complications: Secondary | ICD-10-CM | POA: Diagnosis not present

## 2021-05-18 LAB — HM DIABETES EYE EXAM

## 2021-05-19 LAB — BUN/CREATININE RATIO
BUN/Creatinine Ratio: 19 (calc) (ref 6–22)
BUN: 13 mg/dL (ref 7–25)
Creat: 0.68 mg/dL — ABNORMAL LOW (ref 0.70–1.28)
eGFR: 99 mL/min/{1.73_m2} (ref >=60)

## 2021-05-19 LAB — CREATINE, SERUM

## 2021-05-20 ENCOUNTER — Encounter (HOSPITAL_BASED_OUTPATIENT_CLINIC_OR_DEPARTMENT_OTHER): Payer: Self-pay

## 2021-05-20 ENCOUNTER — Ambulatory Visit (HOSPITAL_BASED_OUTPATIENT_CLINIC_OR_DEPARTMENT_OTHER)
Admission: RE | Admit: 2021-05-20 | Discharge: 2021-05-20 | Disposition: A | Discharge status: Home / Self Care | Payer: Medicare Other | Source: Ambulatory Visit | Attending: Family Medicine | Admitting: Family Medicine

## 2021-05-20 ENCOUNTER — Other Ambulatory Visit: Payer: Self-pay

## 2021-05-20 DIAGNOSIS — K5730 Diverticulosis of large intestine without perforation or abscess without bleeding: Secondary | ICD-10-CM | POA: Diagnosis not present

## 2021-05-20 DIAGNOSIS — N200 Calculus of kidney: Secondary | ICD-10-CM | POA: Diagnosis not present

## 2021-05-20 DIAGNOSIS — R109 Unspecified abdominal pain: Secondary | ICD-10-CM | POA: Diagnosis not present

## 2021-05-20 MED ORDER — IOHEXOL 300 MG/ML  SOLN
100.0000 mL | Freq: Once | INTRAMUSCULAR | Status: AC | PRN
  Administered 2021-05-20: 100 mL via INTRAVENOUS

## 2021-05-25 ENCOUNTER — Telehealth: Payer: Self-pay | Admitting: Gastroenterology

## 2021-05-25 NOTE — Telephone Encounter (Signed)
Previous endoscopy records and recent imaging reviewed: ? ?- Colonoscopy (11/26/2007): Sigmoid diverticulosis, 3 mm sigmoid polyp (path benign) ?- Colonoscopy (04/15/2012): Normal.  Repeat 5 years ?- Colonoscopy (07/13/2016): 2 subcentimeter tubular adenomas removed from the cecum. ?- EGD (07/13/2016): Benign duodenal polyps, large fundic gland polyp resected with hot snare.  No evidence of H. pylori or dysplasia. ?- Small bowel follow-through (07/23/2016): Normal ?- CT A/P (05/20/2021): Benign hepatic cyst, normal pancreas, spleen.  Descending and sigmoid diverticulosis with otherwise normal GI tract ? ?Per available records, family history notable for sibling with colon cancer at age 23 ? ?Labs from 03/2021 with normal CBC and CMP (BG 142). ? ?Based on previous records and recommendation from previous Endoscopist, reasonable timeline for repeat colonoscopy.  He was recently seen in the Cardiology Clinic as well.  Given his past medical history, recommend OV first to discuss continued colonoscopy.  Will also need to obtain clearance to proceed with procedure along with clearance to hold Xarelto in the perioperative setting. ?

## 2021-05-25 NOTE — Telephone Encounter (Signed)
Hi Dr. Bryan Lemma, ? ? ?Patiens wife called to request a transfer of care over to you from [facility]. She is wanting to schedule him for a colonoscopy. I see in Epic that he had one done in 2018 along with a EGD. He was a previous patient of Dr. Sharlett Iles please review and advise on scheduling. ? ?Thanks ?

## 2021-05-25 NOTE — Telephone Encounter (Signed)
Called patient to advise and schedule left voicemail. 

## 2021-05-30 ENCOUNTER — Encounter: Payer: Self-pay | Admitting: Gastroenterology

## 2021-06-03 ENCOUNTER — Other Ambulatory Visit: Payer: Self-pay | Admitting: Family Medicine

## 2021-06-03 DIAGNOSIS — E782 Mixed hyperlipidemia: Secondary | ICD-10-CM

## 2021-06-19 ENCOUNTER — Encounter: Payer: Self-pay | Admitting: Gastroenterology

## 2021-06-19 ENCOUNTER — Ambulatory Visit: Payer: Medicare Other | Admitting: Gastroenterology

## 2021-06-19 VITALS — BP 122/84 | HR 69 | Ht 68.0 in | Wt 183.4 lb

## 2021-06-19 DIAGNOSIS — Z800 Family history of malignant neoplasm of digestive organs: Secondary | ICD-10-CM | POA: Diagnosis not present

## 2021-06-19 DIAGNOSIS — Z86010 Personal history of colonic polyps: Secondary | ICD-10-CM

## 2021-06-19 MED ORDER — CLENPIQ 10-3.5-12 MG-GM -GM/160ML PO SOLN: 1.0000 | ORAL | 0 refills | Status: DC

## 2021-06-19 NOTE — Progress Notes (Signed)
? ? ?          ?Chief Complaint: Colon cancer screening, family history of colon cancer,, discuss colonoscopy ? ? ?Referring Provider:     Libby Maw, MD ? ? ?HPI:   ? ? ?Kurt Kidd is a 71 y.o. male with a history of CAD  s/p DES 2004 cardiac catheterization 2008, dyslipidemia, diabetes, HTN, diverticulosis, GERD, former smoker, referred to the Gastroenterology Clinic for evaluation of ongoing colon cancer screening.  Previously followed at [facility] with prior GI history as below: ? ?Endoscopic History: ?- Colonoscopy (11/26/2007): Sigmoid diverticulosis, 3 mm sigmoid polyp (path benign) ?- Colonoscopy (04/15/2012): Normal.  Repeat 5 years ?- Colonoscopy (07/13/2016): 2 subcentimeter tubular adenomas removed from the cecum. ?- EGD (07/13/2016): Benign duodenal polyps, large fundic gland polyp resected with hot snare.  No evidence of H. pylori or dysplasia. ? ?- Small bowel follow-through (07/23/2016): Normal ?- CT A/P (05/20/2021): Benign hepatic cyst, normal pancreas, spleen.  Descending and sigmoid diverticulosis with otherwise normal GI tract ?  ?Family history notable for brother with colon cancer at age 59. ?  ?Reviewed Labs from 03/2021 with normal CBC and CMP (BG 142) in 05/2021 normal BMP (BG 148). ? ?He is otherwise without any GI complaints.  No change in bowel habits, hematochezia, melena. Only issue is 8 months of low back pain. MRI L spine largely unrevealing.  ? ?Last seen in the Cardiology Clinic on 05/09/2021 with plan for annual follow-up. ? ? ?Past Medical History:  ?Diagnosis Date  ? CAD (coronary artery disease)   ? DES.. 2004 /  catheterization 2008, 10% narrowing stent site, pain not cardiac  ? Colitis, ischemic ([place])   ? Colon polyp   ? Degenerative joint disease   ? Diabetes mellitus 2012  ? Diverticulosis   ? Dyslipidemia   ? Ejection fraction   ? 65-70%, echo, March, 2012  ? GERD (gastroesophageal reflux disease)   ? Hyperlipidemia   ? Hypertension   ? Mitral regurgitation   ?  Echo, mild, 2012  ? Murmur   ? Onychomycosis   ? Overweight(278.02)   ? Palpitations   ? Rapid heartbeat and shortness of breath after eating 2008 recurrent episode 2012  ? ? ? ?Past Surgical History:  ?Procedure Laterality Date  ? ANGIOPLASTY / STENTING FEMORAL  09/09/2002  ? CARDIAC CATHETERIZATION    ? COLONOSCOPY  2014  ? Dr Bary Castilla  ? COLONOSCOPY  11/26/2007  ? Dr Elvera Maria COLONIC MUCOSA WITH HYPEREMIA AND  ? COLONOSCOPY WITH PROPOFOL N/A 07/13/2016  ? Procedure: COLONOSCOPY WITH PROPOFOL;  Surgeon: Robert Bellow, MD;  [facility];  Service: Endoscopy;  Laterality: N/A;  ? ESOPHAGOGASTRODUODENOSCOPY (EGD) WITH PROPOFOL N/A 07/13/2016  ? Procedure: ESOPHAGOGASTRODUODENOSCOPY (EGD) WITH PROPOFOL;  Surgeon: Robert Bellow, MD;  Location: [facility] ENDOSCOPY;  Service: Endoscopy;  Laterality: N/A;  ? TOTAL ANKLE ARTHROPLASTY Left 02/25/2020  ? Procedure: TOTAL ANKLE ARTHROPLASTY;  Surgeon: Wylene Simmer, MD;  Location: [facility];  Service: Orthopedics;  Laterality: Left;  ? UPPER GI ENDOSCOPY  11/26/2007  ? Dr Sharlett Iles  ? ?Family History  ?Problem Relation Age of Onset  ? Heart failure Mother   ? Heart disease Mother   ? Diabetes Father   ?     father also had ASHD/ CABG  ? Heart disease Father   ? Colon cancer Brother 105  ? AAA (abdominal aortic aneurysm) Brother 42  ? ?Social History  ? ?Tobacco Use  ? Smoking status: Former  ?  Packs/day: 1.00  ?  Years: 26.00  ?  Pack years: 26.00  ?  Types: Cigarettes  ?  Quit date: 03/05/1990  ?  Years since quitting: 31.3  ? Smokeless tobacco: Never  ?Substance Use Topics  ? Alcohol use: Yes  ?  Alcohol/week: 0.0 standard drinks  ?  Comment: 2 drinks per day  ? Drug use: No  ? ?Current Outpatient Medications  ?Medication Sig Dispense Refill  ? atorvastatin (LIPITOR) 80 MG tablet Take 1 tablet by mouth once daily 90 tablet 0  ? Blood Glucose Monitoring Suppl (ONE TOUCH ULTRA SYSTEM KIT) W/DEVICE KIT 1 kit by Does not apply route once. 1  each 0  ? esomeprazole (NEXIUM) 20 MG capsule Take 20 mg by mouth daily at 12 noon.    ? ezetimibe (ZETIA) 10 MG tablet TAKE 1 TABLET BY MOUTH ONCE DAILY(CALL TO SCHEDULE A FOLLOW UP APPT) 90 tablet 0  ? metFORMIN (GLUCOPHAGE) 500 MG tablet TAKE 1 TABLET BY MOUTH THREE TIMES DAILY (Patient taking differently: Take 500 mg by mouth 2 (two) times daily.) 270 tablet 0  ? metoprolol tartrate (LOPRESSOR) 50 MG tablet Take 1 tablet by mouth twice daily 180 tablet 0  ? Multiple Vitamins-Minerals (MULTIVITAMIN WITH MINERALS) tablet Take 1 tablet by mouth daily.    ? nitroGLYCERIN (NITROSTAT) 0.4 MG SL tablet Place 1 tablet (0.4 mg total) under the tongue every 5 (five) minutes as needed. 25 tablet 1  ? tamsulosin (FLOMAX) 0.4 MG CAPS capsule Take 1 capsule by mouth once daily 90 capsule 0  ? ?No current facility-administered medications for this visit.  ? ?No Known Allergies ? ? ?Review of Systems: ?All systems reviewed and negative except where noted in HPI.  ? ? ? ?Physical Exam:   ? ?Wt Readings from Last 3 Encounters:  ?06/19/21 183 lb 6 oz (83.2 kg)  ?05/09/21 183 lb 3.2 oz (83.1 kg)  ?03/30/21 185 lb 12.8 oz (84.3 kg)  ? ? ?BP 122/84 (BP Location: Left Arm, Patient Position: Sitting, Cuff Size: Normal)   Pulse 69   Ht 5' 8"  (1.727 m)   Wt 183 lb 6 oz (83.2 kg)   SpO2 98%   BMI 27.88 kg/m?  ?Constitutional:  Pleasant, in no acute distress. ?Psychiatric: Normal mood and affect. Behavior is normal. ?Cardiovascular: Normal rate, regular rhythm. No edema ?Pulmonary/chest: Effort normal and breath sounds normal. No wheezing, rales or rhonchi. ?Abdominal: Soft, nondistended, nontender. Bowel sounds active throughout. There are no masses palpable. No hepatomegaly. ?Neurological: Alert and oriented to person place and time. ?Skin: Skin is warm and dry. No rashes noted. ? ? ?ASSESSMENT AND PLAN;  ? ?1) Family history of colon cancer ?2) Personal history of colon polyps ?- Due for repeat colonoscopy for ongoing polyp  surveillance and colon cancer screening ?- Schedule colonoscopy ? ?The indications, risks, and benefits of colonoscopy were explained to the patient in detail. Risks include but are not limited to bleeding, perforation, adverse reaction to medications, and cardiopulmonary compromise. Sequelae include but are not limited to the possibility of surgery, hospitalization, and mortality. The patient verbalized understanding and wished to proceed. All questions answered, referred to the scheduler and bowel prep ordered. Further recommendations pending results of the exam.  ? ? ?Lavena Bullion, DO, FACG  06/19/2021, 9:05 AM ? ? ?Libby Maw,* ? ?

## 2021-06-19 NOTE — Patient Instructions (Addendum)
If you are age 73 or older, your body mass index should be between 23-30. Your Body mass index is 27.88 kg/m?Marland Kitchen If this is out of the aforementioned range listed, please consider follow up with your Primary Care Provider. ? ?__________________________________________________________ ? ?The [place] [facility] providers would like to encourage you to use [facility] to communicate with providers for non-urgent requests or questions.  Due to long hold times on the telephone, sending your provider a message by [facility] may be a faster and more efficient way to get a response.  Please allow 48 business hours for a response.  Please remember that this is for non-urgent requests.  ? ?Due to recent changes in healthcare laws, you may see the results of your imaging and laboratory studies on MyChart before your provider has had a chance to review them.  We understand that in some cases there may be results that are confusing or concerning to you. Not all laboratory results come back in the same time frame and the provider may be waiting for multiple results in order to interpret others.  Please give [place] 48 hours in order for your provider to thoroughly review all the results before contacting the office for clarification of your results.   ? ?You have been scheduled for a colonoscopy. Please follow written instructions given to you at your visit today.  ?Please pick up your prep supplies at the pharmacy within the next 1-3 days. ?If you use inhalers (even only as needed), please bring them with you on the day of your procedure.  ? ?We have sent the following medications to your pharmacy for you to pick up at your convenience: Clenpiq ? ? ?Thank you for choosing me and [facility]. ? ?Gerrit Heck, D.O.  ? ? ? ? ?We want to thank you for trusting [facility] with your care. All of our staff and providers value the relationships we have built with our patients, and it is an honor to care for you.   ? ?We are writing to let you know that [facility] will close on Jul 17, 2021, and we invite you to continue to see Dr. Carmell Austria and Gerrit Heck at the [facility] [place] office location. We are consolidating our serices at these [facility] practices to better provide care. Our office staff will work with you to ensure a seamless transition.  ? ?Gerrit Heck, DO -Dr. Bryan Lemma will be movig to [facility] at [address], [place], [place] [address], effective Jul 17, 2021.  Contact [phone] to schedule an appointment with him.  ? ?Carmell Austria, MD- Dr. Lyndel Safe will be movig to [facility] at [address] [place], [place] [address], effective Jul 17, 2021.  Contact [phone] to schedule an appointment with him.  ? ?Requesting Medical Records ?If you need to request your medical records, please follow the instructions below. Your medical records are confidential, and a copy can be transferred to another provider or released to you or another person you designate only with your permission. ? ?There are several ways to request your medical records: ?Requests for medical records can be submitted through our practice.   ?You can also request your records electronically, in your MyChart account by selecting the ?Request Health Records? tab.  ?If you need additional information on how to request records, please go to [email or web address]/, choose Patient Information, then select Request Medical Records. ?To make an appointment or if you have any questions about  your health care needs, please contact our office at [phone] and one of our staff members will be glad to assist you. ?[facility] is committed to providing exceptional care for you and our community. Thank you for allowing [place] to serve your health care needs. ?Sincerely, ? ?Windy Canny, Director [facility] ?[facility] also offers convenient virtual care options.  Sore throat? Sinus problems? Cold or flu symptoms? Get care from the comfort of home with [facility] Video Visits and e-Visits. Learn more about the non-emergency conditions treated and start your virtual visit at [email or web address]  ? ?

## 2021-06-27 ENCOUNTER — Ambulatory Visit: Payer: Medicare Other | Admitting: Family Medicine

## 2021-06-27 ENCOUNTER — Encounter: Payer: Self-pay | Admitting: Gastroenterology

## 2021-06-27 ENCOUNTER — Ambulatory Visit (AMBULATORY_SURGERY_CENTER): Payer: Medicare Other | Admitting: Gastroenterology

## 2021-06-27 VITALS — BP 163/76 | HR 62 | Temp 98.4°F | Resp 15 | Ht 68.0 in | Wt 183.0 lb

## 2021-06-27 DIAGNOSIS — Z800 Family history of malignant neoplasm of digestive organs: Secondary | ICD-10-CM | POA: Diagnosis not present

## 2021-06-27 DIAGNOSIS — K5730 Diverticulosis of large intestine without perforation or abscess without bleeding: Secondary | ICD-10-CM

## 2021-06-27 DIAGNOSIS — Z86010 Personal history of colonic polyps: Secondary | ICD-10-CM

## 2021-06-27 DIAGNOSIS — K640 First degree hemorrhoids: Secondary | ICD-10-CM

## 2021-06-27 MED ORDER — SODIUM CHLORIDE 0.9 % IV SOLN: 500.0000 mL | Freq: Once | INTRAVENOUS | Status: DC

## 2021-06-27 NOTE — Patient Instructions (Signed)
YOU HAD AN ENDOSCOPIC PROCEDURE TODAY AT [facility]:   Refer to the procedure report that was given to you for any specific questions about what was found during the examination.  If the procedure report does not answer your questions, please call your gastroenterologist to clarify.  If you requested that your care partner not be given the details of your procedure findings, then the procedure report has been included in a sealed envelope for you to review at your convenience later. ? ?**handouts given on diverticulosis and hemorrhoids** ? ?YOU SHOULD EXPECT: Some feelings of bloating in the abdomen. Passage of more gas than usual.  Walking can help get rid of the air that was put into your GI tract during the procedure and reduce the bloating. If you had a lower endoscopy (such as a colonoscopy or flexible sigmoidoscopy) you may notice spotting of blood in your stool or on the toilet paper. If you underwent a bowel prep for your procedure, you may not have a normal bowel movement for a few days. ? ?Please Note:  You might notice some irritation and congestion in your nose or some drainage.  This is from the oxygen used during your procedure.  There is no need for concern and it should clear up in a day or so. ? ?SYMPTOMS TO REPORT IMMEDIATELY: ? ?Following lower endoscopy (colonoscopy or flexible sigmoidoscopy): ? Excessive amounts of blood in the stool ? Significant tenderness or worsening of abdominal pains ? Swelling of the abdomen that is new, acute ? Fever of 100?F or higher ? ? ?For urgent or emergent issues, a gastroenterologist can be reached at any hour by calling [phone]. ?Do not use MyChart messaging for urgent concerns.  ? ? ?DIET:  We do recommend a small meal at first, but then you may proceed to your regular diet.  Drink plenty of fluids but you should avoid alcoholic beverages for 24 hours. ? ?ACTIVITY:  You should plan to take it easy for the rest of today and you should  NOT DRIVE or use heavy machinery until tomorrow (because of the sedation medicines used during the test).   ? ?FOLLOW UP: ?Our staff will call the number listed on your records 48-72 hours following your procedure to check on you and address any questions or concerns that you may have regarding the information given to you following your procedure. If we do not reach you, we will leave a message.  We will attempt to reach you two times.  During this call, we will ask if you have developed any symptoms of COVID 19. If you develop any symptoms (ie: fever, flu-like symptoms, shortness of breath, cough etc.) before then, please call [phone].  If you test positive for Covid 19 in the 2 weeks post procedure, please call and report this information to [place].   ? ?If any biopsies were taken you will be contacted by phone or by letter within the next 1-3 weeks.  Please call us at [phone] if you have not heard about the biopsies in 3 weeks.  ? ? ?SIGNATURES/CONFIDENTIALITY: ?You and/or your care partner have signed paperwork which will be entered into your electronic medical record.  These signatures attest to the fact that that the information above on your After Visit Summary has been reviewed and is understood.  Full responsibility of the confidentiality of this discharge information lies with you and/or your care-partner.  ?

## 2021-06-27 NOTE — Op Note (Signed)
[place] ?Patient Name: Kurt Kidd ?Procedure Date: 06/27/2021 11:19 AM ?MRN: [id number] ?Endoscopist: Gerrit Heck , MD ?Age: 71 ?Referring MD:  ?Date of Birth: 21-Mar-1950 ?Gender: Male ?Account #: [id number] ?Procedure:                Colonoscopy ?Indications:              Screening in patient at increased risk: Colorectal  ?                          cancer in brother before age 73, Surveillance:  ?                          Personal history of adenomatous polyps on last  ?                          colonoscopy 5 years ago ?                          - Colonoscopy (11/26/2007): Sigmoid diverticulosis,  ?                          3 mm sigmoid polyp (path benign) ?                          - Colonoscopy (04/15/2012): Normal. ??Repeat 5 years ?                          - Colonoscopy (07/13/2016): 2 subcentimeter tubular  ?                          adenomas removed from the cecum. ?Medicines:                Monitored Anesthesia Care ?Procedure:                Pre-Anesthesia Assessment: ?                          - Prior to the procedure, a History and Physical  ?                          was performed, and patient medications and  ?                          allergies were reviewed. The patient's tolerance of  ?                          previous anesthesia was also reviewed. The risks  ?                          and benefits of the procedure and the sedation  ?                          options and risks were discussed with the patient.  ?  All questions were answered, and informed consent  ?                          was obtained. Prior Anticoagulants: The patient has  ?                          taken no previous anticoagulant or antiplatelet  ?                          agents. ASA Grade Assessment: III - A patient with  ?                          severe systemic disease. After reviewing the risks  ?                          and benefits, the patient was deemed in  ?                           satisfactory condition to undergo the procedure. ?                          After obtaining informed consent, the colonoscope  ?                          was passed under direct vision. Throughout the  ?                          procedure, the patient's blood pressure, pulse, and  ?                          oxygen saturations were monitored continuously. The  ?                          Olympus CF-[id number] (#[id number]) Colonoscope was  ?                          introduced through the anus and advanced to the the  ?                          terminal ileum. The colonoscopy was performed  ?                          without difficulty. The patient tolerated the  ?                          procedure well. The quality of the bowel  ?                          preparation was good. The terminal ileum, ileocecal  ?                          valve, appendiceal orifice, and rectum were  ?  photographed. ?Scope In: 11:26:19 AM ?Scope Out: 11:36:38 AM ?Scope Withdrawal Time: 0 hours 9 minutes 19 seconds  ?Total Procedure Duration: 0 hours 10 minutes 19 seconds  ?Findings:                 The perianal and digital rectal examinations were  ?                          normal. ?                          Multiple small-mouthed diverticula were found in  ?                          the sigmoid colon. ?                          Non-bleeding internal hemorrhoids were found during  ?                          retroflexion. The hemorrhoids were small. ?                          The exam was otherwise normal throughout the  ?                          remainder of the colon. ?                          The terminal ileum appeared normal. ?Complications:            No immediate complications. ?Estimated Blood Loss:     Estimated blood loss: none. ?Impression:               - Diverticulosis in the sigmoid colon. ?                          - Non-bleeding internal hemorrhoids. ?                          - The examined  portion of the ileum was normal. ?                          - No specimens collected. ?Recommendation:           - Patient has a contact number available for  ?                          emergencies. The signs and symptoms of potential  ?                          delayed complications were discussed with the  ?                          patient. Return to normal activities tomorrow.  ?                          Written discharge instructions were provided to the  ?  patient. ?                          - Resume previous diet. ?                          - Continue present medications. ?                          - Due to family history, repeat colonoscopy in 5  ?                          years for continued screening purposes. ?                          - Return to GI office PRN. ?Gerrit Heck, MD ?06/27/2021 11:43:55 AM ?

## 2021-06-27 NOTE — Progress Notes (Signed)
? ?GASTROENTEROLOGY PROCEDURE H&P NOTE  ? ?Primary Care Physician: ?Libby Maw, MD ? ? ? ?Reason for Procedure:  Family history of colon cancer, colon cancer screening, personal history of colon polyps ? ?Plan:    Colonoscopy ? ?Patient is appropriate for endoscopic procedure(s) in the ambulatory ([place]) setting. ? ?The nature of the procedure, as well as the risks, benefits, and alternatives were carefully and thoroughly reviewed with the patient. Ample time for discussion and questions allowed. The patient understood, was satisfied, and agreed to proceed.  ? ? ? ?HPI: ?Kurt Kidd is a 71 y.o. male who presents for colonoscopy for evaluation of family history of colon cancer, personal history of colon polyps.  Patient was most recently seen in the Gastroenterology Clinic on 06/19/2021 by me.  No interval change in medical history since that appointment. Please refer to that note for full details regarding GI history and clinical presentation.  ? ?Endoscopic History: ?- Colonoscopy (11/26/2007): Sigmoid diverticulosis, 3 mm sigmoid polyp (path benign) ?- Colonoscopy (04/15/2012): Normal.  Repeat 5 years ?- Colonoscopy (07/13/2016): 2 subcentimeter tubular adenomas removed from the cecum. ?- EGD (07/13/2016): Benign duodenal polyps, large fundic gland polyp resected with hot snare.  No evidence of H. pylori or dysplasia. ? ?Past Medical History:  ?Diagnosis Date  ? Blood transfusion without reported diagnosis   ? CAD (coronary artery disease)   ? DES.. 2004 /  catheterization 2008, 10% narrowing stent site, pain not cardiac  ? Colitis, ischemic ([place])   ? Colon polyp   ? Degenerative joint disease   ? Diabetes mellitus 2012  ? Diverticulosis   ? Dyslipidemia   ? Ejection fraction   ? 65-70%, echo, March, 2012  ? GERD (gastroesophageal reflux disease)   ? Hyperlipidemia   ? Hypertension   ? Mitral regurgitation   ? Echo, mild, 2012  ? Murmur   ? Onychomycosis   ? Overweight(278.02)   ? Palpitations   ?  Rapid heartbeat and shortness of breath after eating 2008 recurrent episode 2012  ? ? ?Past Surgical History:  ?Procedure Laterality Date  ? ANGIOPLASTY / STENTING FEMORAL  09/09/2002  ? CARDIAC CATHETERIZATION    ? COLONOSCOPY  2014  ? Dr Bary Castilla  ? COLONOSCOPY  11/26/2007  ? Dr Elvera Maria COLONIC MUCOSA WITH HYPEREMIA AND  ? COLONOSCOPY WITH PROPOFOL N/A 07/13/2016  ? Procedure: COLONOSCOPY WITH PROPOFOL;  Surgeon: Robert Bellow, MD;  Location: [facility] ENDOSCOPY;  Service: Endoscopy;  Laterality: N/A;  ? ESOPHAGOGASTRODUODENOSCOPY (EGD) WITH PROPOFOL N/A 07/13/2016  ? Procedure: ESOPHAGOGASTRODUODENOSCOPY (EGD) WITH PROPOFOL;  Surgeon: Robert Bellow, MD;  Location: [facility] ENDOSCOPY;  Service: Endoscopy;  Laterality: N/A;  ? TOTAL ANKLE ARTHROPLASTY Left 02/25/2020  ? Procedure: TOTAL ANKLE ARTHROPLASTY;  Surgeon: Wylene Simmer, MD;  Location: [facility];  Service: Orthopedics;  Laterality: Left;  ? UPPER GI ENDOSCOPY  11/26/2007  ? Dr Sharlett Iles  ? ? ?Prior to Admission medications   ?Medication Sig Start Date End Date Taking? Authorizing Provider  ?atorvastatin (LIPITOR) 80 MG tablet Take 1 tablet by mouth once daily 06/04/21  Yes Libby Maw, MD  ?Blood Glucose Monitoring Suppl (ONE TOUCH ULTRA SYSTEM KIT) W/DEVICE KIT 1 kit by Does not apply route once. 02/21/15  Yes Noralee Space, MD  ?esomeprazole (NEXIUM) 20 MG capsule Take 20 mg by mouth daily at 12 noon.   Yes [provider]  ?ezetimibe (ZETIA) 10 MG tablet TAKE 1 TABLET BY MOUTH ONCE DAILY(CALL TO SCHEDULE A FOLLOW UP  APPT) 02/02/21  Yes Libby Maw, MD  ?metFORMIN (GLUCOPHAGE) 500 MG tablet TAKE 1 TABLET BY MOUTH THREE TIMES DAILY ?Patient taking differently: Take 500 mg by mouth 2 (two) times daily. 03/20/21  Yes Libby Maw, MD  ?metoprolol tartrate (LOPRESSOR) 50 MG tablet Take 1 tablet by mouth twice daily 03/20/21  Yes Libby Maw, MD  ?Multiple Vitamins-Minerals (MULTIVITAMIN  WITH MINERALS) tablet Take 1 tablet by mouth daily.   Yes [provider]  ?Sod Picosulfate-Mag Ox-Cit Acd (CLENPIQ) 10-3.5-12 MG-GM -GM/160ML SOLN Take 1 kit by mouth as directed. 06/19/21  Yes Allyse Fregeau V, DO  ?tamsulosin (FLOMAX) 0.4 MG CAPS capsule Take 1 capsule by mouth once daily 04/27/21  Yes Libby Maw, MD  ?nitroGLYCERIN (NITROSTAT) 0.4 MG SL tablet Place 1 tablet (0.4 mg total) under the tongue every 5 (five) minutes as needed. 07/07/13   Noralee Space, MD  ? ? ?Current Outpatient Medications  ?Medication Sig Dispense Refill  ? atorvastatin (LIPITOR) 80 MG tablet Take 1 tablet by mouth once daily 90 tablet 0  ? Blood Glucose Monitoring Suppl (ONE TOUCH ULTRA SYSTEM KIT) W/DEVICE KIT 1 kit by Does not apply route once. 1 each 0  ? esomeprazole (NEXIUM) 20 MG capsule Take 20 mg by mouth daily at 12 noon.    ? ezetimibe (ZETIA) 10 MG tablet TAKE 1 TABLET BY MOUTH ONCE DAILY(CALL TO SCHEDULE A FOLLOW UP APPT) 90 tablet 0  ? metFORMIN (GLUCOPHAGE) 500 MG tablet TAKE 1 TABLET BY MOUTH THREE TIMES DAILY (Patient taking differently: Take 500 mg by mouth 2 (two) times daily.) 270 tablet 0  ? metoprolol tartrate (LOPRESSOR) 50 MG tablet Take 1 tablet by mouth twice daily 180 tablet 0  ? Multiple Vitamins-Minerals (MULTIVITAMIN WITH MINERALS) tablet Take 1 tablet by mouth daily.    ? Sod Picosulfate-Mag Ox-Cit Acd (CLENPIQ) 10-3.5-12 MG-GM -GM/160ML SOLN Take 1 kit by mouth as directed. 320 mL 0  ? tamsulosin (FLOMAX) 0.4 MG CAPS capsule Take 1 capsule by mouth once daily 90 capsule 0  ? nitroGLYCERIN (NITROSTAT) 0.4 MG SL tablet Place 1 tablet (0.4 mg total) under the tongue every 5 (five) minutes as needed. 25 tablet 1  ? ?Current Facility-Administered Medications  ?Medication Dose Route Frequency Provider Last Rate Last Admin  ? 0.9 %  sodium chloride infusion  500 mL Intravenous Once Lechelle Wrigley V, DO      ? ? ?Allergies as of 06/27/2021  ? (No Known Allergies)  ? ? ?Family History   ?Problem Relation Age of Onset  ? Heart failure Mother   ? Heart disease Mother   ? Diabetes Father   ?     father also had ASHD/ CABG  ? Heart disease Father   ? Colon cancer Brother 44  ? AAA (abdominal aortic aneurysm) Brother 69  ? Esophageal cancer Neg Hx   ? Rectal cancer Neg Hx   ? Stomach cancer Neg Hx   ? ? ?Social History  ? ?Socioeconomic History  ? Marital status: Married  ?  Spouse name: Not on file  ? Number of children: Not on file  ? Years of education: Not on file  ? Highest education level: Not on file  ?Occupational History  ? 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  Employer: [employer]  ?Tobacco Use  ? Smoking status: Former  ?  Packs/day: 1.00  ?  Years: 26.00  ?  Pack years: 26.00  ?  Types: Cigarettes  ?  Quit date: 03/05/1990  ?  Years since quitting: 31.3  ? Smokeless tobacco: Never  ?Substance and Sexual Activity  ? Alcohol use: Yes  ?  Alcohol/week: 0.0 standard drinks  ?  Comment: 2 drinks per day  ? Drug use: No  ? Sexual activity: Not on file  ?Other Topics Concern  ? Not on file  ?Social History Narrative  ? Not on file  ? ?Social Determinants of Health  ? ?Financial Resource Strain: Low Risk   ? Difficulty of Paying Living Expenses: Not hard at all  ?Food Insecurity: No Food Insecurity  ? Worried About Charity fundraiser in the Last Year: Never true  ? Ran Out of Food in the Last Year: Never true  ?Transportation Needs: No Transportation Needs  ? Lack of Transportation (Medical): No  ? Lack of Transportation (Non-Medical): No  ?Physical Activity: Sufficiently Active  ? Days of Exercise per Week: 5 days  ? Minutes of Exercise per Session: 60 min  ?Stress: No Stress Concern Present  ? Feeling of Stress : Not at all  ?Social Connections: Moderately Isolated  ? Frequency of Communication with Friends and Family: Twice a week  ? Frequency of Social Gatherings with Friends and Family: Twice a week  ? Attends Religious Services: Never  ? Active Member of Clubs or Organizations: No  ?  Attends Archivist Meetings: Never  ? Marital Status: Married  ?Intimate Partner Violence: Not At Risk  ? Fear of Current or Ex-Partner: No  ? Emotionally Abused: No  ? Physically Abused: No  ? S

## 2021-06-27 NOTE — Progress Notes (Signed)
To Pacu, VSS. Report to [email or web address] 

## 2021-06-29 ENCOUNTER — Telehealth: Payer: Self-pay

## 2021-06-29 NOTE — Telephone Encounter (Signed)
?  Follow up Call- ? ? ?  06/27/2021  ? 10:38 AM  ?Call back number  ?Post procedure Call Back phone  # [phone]  ?Permission to leave phone message Yes  ?  ? ?Patient questions: ? ?Do you have a fever, pain , or abdominal swelling? No. ?Pain Score  0 * ? ?Have you tolerated food without any problems? Yes.   ? ?Have you been able to return to your normal activities? Yes.   ? ?Do you have any questions about your discharge instructions: ?Diet   No. ?Medications  No. ?Follow up visit  No. ? ?Do you have questions or concerns about your Care? No. ? ?Actions: ?* If pain score is 4 or above: ?No action needed, pain <4. ? ? ?

## 2021-07-03 ENCOUNTER — Encounter: Payer: Self-pay | Admitting: Family Medicine

## 2021-07-03 ENCOUNTER — Ambulatory Visit (INDEPENDENT_AMBULATORY_CARE_PROVIDER_SITE_OTHER): Payer: Medicare Other | Admitting: Family Medicine

## 2021-07-03 VITALS — BP 140/78 | HR 79 | Temp 97.6°F | Ht 68.0 in | Wt 183.2 lb

## 2021-07-03 DIAGNOSIS — E782 Mixed hyperlipidemia: Secondary | ICD-10-CM | POA: Diagnosis not present

## 2021-07-03 DIAGNOSIS — I10 Essential (primary) hypertension: Secondary | ICD-10-CM | POA: Diagnosis not present

## 2021-07-03 DIAGNOSIS — R809 Proteinuria, unspecified: Secondary | ICD-10-CM | POA: Diagnosis not present

## 2021-07-03 DIAGNOSIS — E119 Type 2 diabetes mellitus without complications: Secondary | ICD-10-CM | POA: Diagnosis not present

## 2021-07-03 LAB — BASIC METABOLIC PANEL
BUN: 13 mg/dL (ref 6–23)
CO2: 28 mEq/L (ref 19–32)
Calcium: 9.7 mg/dL (ref 8.4–10.5)
Chloride: 101 mEq/L (ref 96–112)
Creatinine, Ser: 0.65 mg/dL (ref 0.40–1.50)
GFR: 94.06 mL/min (ref >60.00)
Glucose, Bld: 133 mg/dL — ABNORMAL HIGH (ref 70–99)
Potassium: 4.5 mEq/L (ref 3.5–5.1)
Sodium: 138 mEq/L (ref 135–145)

## 2021-07-03 LAB — LIPID PANEL
Cholesterol: 197 mg/dL (ref 0–200)
HDL: 60.2 mg/dL (ref >39.00)
LDL Cholesterol: 114 mg/dL — ABNORMAL HIGH (ref 0–99)
NonHDL: 136.51
Total CHOL/HDL Ratio: 3
Triglycerides: 114 mg/dL (ref 0.0–149.0)
VLDL: 22.8 mg/dL (ref 0.0–40.0)

## 2021-07-03 LAB — HEMOGLOBIN A1C: Hgb A1c MFr Bld: 6.7 % — ABNORMAL HIGH (ref 4.6–6.5)

## 2021-07-03 NOTE — Progress Notes (Signed)
? ?Established Patient Office Visit ? ?Subjective   ?Patient ID: Kurt Kidd, male    DOB: [birth year]  Age: 71 y.o. MRN: [id number] ? ?Chief Complaint  ?Patient presents with  ? Follow-up  ?  3 month follow up, no concerns. Patient fasting.   ? ? ?HPI follow-up of hypertension and elevated cholesterol diabetes and microalbuminuria.  Renal function remains excellent.  Patient decided not to start the SGLT2 secondary to cost.  Blood pressure normally well controlled with metoprolol.  Continues atorvastatin and Zetia for control of cholesterol.  Beatties well controlled with metformin. ? ? ? ?Review of Systems  ?Constitutional: Negative.   ?HENT: Negative.    ?Eyes:  Negative for blurred vision, discharge and redness.  ?Respiratory: Negative.    ?Cardiovascular: Negative.   ?Gastrointestinal:  Negative for abdominal pain.  ?Genitourinary: Negative.   ?Musculoskeletal: Negative.  Negative for myalgias.  ?Skin:  Negative for rash.  ?Neurological:  Negative for tingling, loss of consciousness and weakness.  ?Endo/Heme/Allergies:  Negative for polydipsia.  ? ?  ?Objective:  ?  ? ?BP 140/78 (BP Location: Right Arm, Patient Position: Sitting, Cuff Size: Normal)   Pulse 79   Temp 97.6 ?F (36.4 ?C) (Temporal)   Ht 5' 8"  (1.727 m)   Wt 183 lb 3.2 oz (83.1 kg)   SpO2 95%   BMI 27.86 kg/m?  ? ? ?Physical Exam ?Constitutional:   ?   General: He is not in acute distress. ?   Appearance: Normal appearance. He is not ill-appearing, toxic-appearing or diaphoretic.  ?HENT:  ?   Head: Normocephalic and atraumatic.  ?   Right Ear: External ear normal.  ?   Left Ear: External ear normal.  ?   Mouth/Throat:  ?   Mouth: Mucous membranes are moist.  ?   Pharynx: Oropharynx is clear. No oropharyngeal exudate or posterior oropharyngeal erythema.  ?Eyes:  ?   General: No scleral icterus.    ?   Right eye: No discharge.     ?   Left eye: No discharge.  ?   Extraocular Movements: Extraocular movements intact.  ?   Conjunctiva/sclera:  Conjunctivae normal.  ?   Pupils: Pupils are equal, round, and reactive to light.  ?Cardiovascular:  ?   Rate and Rhythm: Normal rate and regular rhythm.  ?   Pulses:     ?     Dorsalis pedis pulses are 2+ on the right side and 2+ on the left side.  ?     Posterior tibial pulses are 1+ on the right side and 1+ on the left side.  ?   Heart sounds: Murmur heard.  ?Pulmonary:  ?   Effort: Pulmonary effort is normal. No respiratory distress.  ?   Breath sounds: Normal breath sounds.  ?Abdominal:  ?   General: Bowel sounds are normal.  ?   Tenderness: There is no abdominal tenderness. There is no guarding.  ?Musculoskeletal:  ?   Cervical back: No rigidity or tenderness.  ?Skin: ?   General: Skin is warm and dry.  ?Neurological:  ?   Mental Status: He is alert and oriented to person, place, and time.  ?Psychiatric:     ?   Mood and Affect: Mood normal.     ?   Behavior: Behavior normal.  ? ? ?Diabetic Foot Exam - Simple   ?Simple Foot Form ?Diabetic Foot exam was performed with the following findings: Yes 07/03/2021  8:50 AM  ?Visual Inspection ?See comments: Yes ?  Sensation Testing ?Intact to touch and monofilament testing bilaterally: Yes ?Pulse Check ?Posterior Tibialis and Dorsalis pulse intact bilaterally: Yes ?Comments ?Cavus feet bilaterally.  Well-healed left anterior ankle surgical scar consistent with past history of total ankle replacement ?  ?  ?No results found for any visits on 07/03/21. ? ? ? ?The 10-year ASCVD risk score (Arnett DK, et al., 2019) is: 40.4% ? ?  ?Assessment & Plan:  ? ?Problem List Items Addressed This Visit   ? ?  ? Cardiovascular and Mediastinum  ? Essential hypertension - Primary  ? Relevant Orders  ? Basic metabolic panel  ?  ? Endocrine  ? Diabetes mellitus type 2, noninsulin dependent ([place])  ? Relevant Orders  ? Basic metabolic panel  ? Hemoglobin A1c  ?  ? Other  ? Mixed hyperlipidemia  ? Relevant Orders  ? Lipid panel  ? Microalbuminuria  ? Relevant Orders  ? Basic metabolic panel  ?  Multiple Myeloma Panel (SPEP&IFE w/QIG)  ? ? ?Return in about 6 months (around 01/03/2022).  ?Discussed using an SGLT2 inhibitor.  It is too expensive for him at this point.  GFR remains excellent.  We will continue to follow to area for now.  Continue all current medications as above.  Information given on managing hypertension and microalbuminuria. ? ?Libby Maw, MD ? ?

## 2021-07-04 ENCOUNTER — Ambulatory Visit: Payer: Medicare Other | Admitting: Family Medicine

## 2021-07-06 LAB — MULTIPLE MYELOMA PANEL, SERUM
Albumin SerPl Elph-Mcnc: 3.9 g/dL (ref 2.9–4.4)
Albumin/Glob SerPl: 1.4 (ref 0.7–1.7)
Alpha 1: 0.3 g/dL (ref 0.0–0.4)
Alpha2 Glob SerPl Elph-Mcnc: 0.9 g/dL (ref 0.4–1.0)
B-Globulin SerPl Elph-Mcnc: 1.2 g/dL (ref 0.7–1.3)
Gamma Glob SerPl Elph-Mcnc: 0.6 g/dL (ref 0.4–1.8)
Globulin, Total: 2.9 g/dL (ref 2.2–3.9)
IgA/Immunoglobulin A, Serum: 132 mg/dL (ref 61–437)
IgG (Immunoglobin G), Serum: 631 mg/dL (ref 603–1613)
IgM (Immunoglobulin M), Srm: 37 mg/dL (ref 15–143)
Total Protein: 6.8 g/dL (ref 6.0–8.5)

## 2021-07-06 LAB — SPECIMEN STATUS REPORT

## 2021-07-16 ENCOUNTER — Other Ambulatory Visit: Payer: Self-pay | Admitting: Family Medicine

## 2021-07-16 DIAGNOSIS — E782 Mixed hyperlipidemia: Secondary | ICD-10-CM

## 2021-07-16 DIAGNOSIS — I6523 Occlusion and stenosis of bilateral carotid arteries: Secondary | ICD-10-CM

## 2021-07-30 ENCOUNTER — Other Ambulatory Visit: Payer: Self-pay | Admitting: Family Medicine

## 2021-07-30 DIAGNOSIS — N401 Enlarged prostate with lower urinary tract symptoms: Secondary | ICD-10-CM

## 2021-09-11 ENCOUNTER — Other Ambulatory Visit: Payer: Self-pay | Admitting: Family Medicine

## 2021-09-11 DIAGNOSIS — E782 Mixed hyperlipidemia: Secondary | ICD-10-CM

## 2021-10-07 ENCOUNTER — Other Ambulatory Visit: Payer: Self-pay | Admitting: Family Medicine

## 2021-10-07 DIAGNOSIS — I10 Essential (primary) hypertension: Secondary | ICD-10-CM

## 2021-12-02 DIAGNOSIS — I87332 Chronic venous hypertension (idiopathic) with ulcer and inflammation of left lower extremity: Secondary | ICD-10-CM | POA: Diagnosis not present

## 2021-12-02 DIAGNOSIS — S8012XA Contusion of left lower leg, initial encounter: Secondary | ICD-10-CM | POA: Diagnosis not present

## 2021-12-07 ENCOUNTER — Ambulatory Visit (INDEPENDENT_AMBULATORY_CARE_PROVIDER_SITE_OTHER): Payer: Medicare Other | Admitting: Family Medicine

## 2021-12-07 ENCOUNTER — Encounter: Payer: Self-pay | Admitting: Family Medicine

## 2021-12-07 VITALS — BP 148/70 | HR 79 | Temp 98.4°F | Ht 68.0 in | Wt 189.6 lb

## 2021-12-07 DIAGNOSIS — M79662 Pain in left lower leg: Secondary | ICD-10-CM | POA: Diagnosis not present

## 2021-12-07 DIAGNOSIS — L03116 Cellulitis of left lower limb: Secondary | ICD-10-CM

## 2021-12-07 DIAGNOSIS — T79A22A Traumatic compartment syndrome of left lower extremity, initial encounter: Secondary | ICD-10-CM | POA: Diagnosis not present

## 2021-12-07 DIAGNOSIS — Z23 Encounter for immunization: Secondary | ICD-10-CM | POA: Diagnosis not present

## 2021-12-07 DIAGNOSIS — I10 Essential (primary) hypertension: Secondary | ICD-10-CM

## 2021-12-07 MED ORDER — IRBESARTAN 75 MG PO TABS: 75.0000 mg | ORAL_TABLET | Freq: Every day | ORAL | 1 refills | Status: DC

## 2021-12-07 MED ORDER — CEPHALEXIN 500 MG PO CAPS: 500.0000 mg | ORAL_CAPSULE | Freq: Three times a day (TID) | ORAL | 0 refills | Status: AC

## 2021-12-07 NOTE — Progress Notes (Signed)
Established Patient Office Visit  Subjective   Patient ID: Kurt Kidd, male    DOB: 02-19-1951  Age: 71 y.o. MRN: [id number]  Chief Complaint  Patient presents with   Leg Injury    Left leg injury seen at urgent care 5 days ago leg still very painful with some burning at all times.     HPI sustained a glancing blow across his anterior lower leg 11 days ago.  He sustained a contusion.  Affected area remains swollen.  There is pain laterally and medial to the contusion.  He is having numbness in his foot.  He admits to ongoing numbness status post ORIF injury several years ago but believes that it is worse after this injury.  Strength has not been affected.  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  Blood pressure has been elevated over the last 3 visits.  He is a diabetic.  He is not on an ACE or  ARB.    Review of Systems  Constitutional: Negative.   HENT: Negative.    Eyes:  Negative for blurred vision, discharge and redness.  Respiratory: Negative.    Cardiovascular: Negative.   Gastrointestinal:  Negative for abdominal pain.  Genitourinary: Negative.   Musculoskeletal: Negative.  Negative for myalgias.  Skin:  Negative for rash.  Neurological:  Positive for tingling. Negative for loss of consciousness, weakness and headaches.  Endo/Heme/Allergies:  Negative for polydipsia.      Objective:     BP (!) 148/70 (BP Location: Right Arm, Patient Position: Sitting, Cuff Size: Normal)   Pulse 79   Temp 98.4 F (36.9 C) (Temporal)   Ht '5\' 8"'$  (1.727 m)   Wt 189 lb 9.6 oz (86 kg)   SpO2 94%   BMI 28.83 kg/m  BP Readings from Last 3 Encounters:  12/07/21 (!) 148/70  07/03/21 140/78  06/27/21 (!) 163/76   Wt Readings from Last 3 Encounters:  12/07/21 189 lb 9.6 oz (86 kg)  07/03/21 183 lb 3.2 oz (83.1 kg)  06/27/21 183 lb (83 kg)      Physical Exam Constitutional:      General: He is not in acute distress.    Appearance: Normal appearance.  He is not ill-appearing, toxic-appearing or diaphoretic.  HENT:     Head: Normocephalic and atraumatic.     Right Ear: External ear normal.     Left Ear: External ear normal.  Eyes:     General: No scleral icterus.       Right eye: No discharge.        Left eye: No discharge.     Extraocular Movements: Extraocular movements intact.     Conjunctiva/sclera: Conjunctivae normal.  Cardiovascular:     Comments: Pulses were difficult to find in the PT and DP areas.  However, capillary refill was brisk and immediate in the great and fifth toes. Pulmonary:     Effort: Pulmonary effort is normal. No respiratory distress.  Musculoskeletal:     Left lower leg: Swelling, laceration and tenderness present. Edema present.       Legs:  Skin:    General: Skin is warm and dry.  Neurological:     Mental Status: He is alert and oriented to person, place, and time.  Psychiatric:        Mood and Affect: Mood normal.        Behavior: Behavior normal.      No results found for any visits on  12/07/21.    The 10-year ASCVD risk score (Arnett DK, et al., 2019) is: 48.9%    Assessment & Plan:   Problem List Items Addressed This Visit       Cardiovascular and Mediastinum   Essential hypertension   Relevant Medications   irbesartan (AVAPRO) 75 MG tablet     Other   Need for influenza vaccination   Relevant Orders   Flu vaccine HIGH DOSE PF (Fluzone High dose) (Completed)   Other Visit Diagnoses     Traumatic compartment syndrome of left lower extremity, initial encounter [facility])    -  Primary   Relevant Orders   Ambulatory referral to Orthopedic Surgery   Cellulitis of left lower extremity       Relevant Medications   cephALEXin (KEFLEX) 500 MG capsule       Return in about 3 weeks (around 12/28/2021).  As for urgent consultation with Ortho to rule out compartment syndrome.  Will start on Keflex 500 3 times daily for 10 days.  Blood pressures been elevated over the last 3 visits.   He is a diabetic.  Have added starting dose of irbesartan.  Flu shot today.  Follow-up in 3 weeks.  Flu vaccine today.  Advised him to stay home for the next 5 days and elevate his leg above the level of his heart for 15 to 20 minutes 6-7 times daily.  Libby Maw, MD

## 2021-12-26 ENCOUNTER — Ambulatory Visit: Payer: Medicare Other | Admitting: Family Medicine

## 2022-01-15 ENCOUNTER — Encounter: Payer: Self-pay | Admitting: Family Medicine

## 2022-01-29 ENCOUNTER — Other Ambulatory Visit: Payer: Self-pay | Admitting: Family Medicine

## 2022-01-29 DIAGNOSIS — E782 Mixed hyperlipidemia: Secondary | ICD-10-CM

## 2022-02-22 DIAGNOSIS — R051 Acute cough: Secondary | ICD-10-CM | POA: Diagnosis not present

## 2022-02-22 DIAGNOSIS — R0981 Nasal congestion: Secondary | ICD-10-CM | POA: Diagnosis not present

## 2022-02-22 DIAGNOSIS — E119 Type 2 diabetes mellitus without complications: Secondary | ICD-10-CM | POA: Diagnosis not present

## 2022-02-22 DIAGNOSIS — R509 Fever, unspecified: Secondary | ICD-10-CM | POA: Diagnosis not present

## 2022-03-13 ENCOUNTER — Other Ambulatory Visit: Payer: Self-pay | Admitting: Family Medicine

## 2022-03-19 ENCOUNTER — Ambulatory Visit (INDEPENDENT_AMBULATORY_CARE_PROVIDER_SITE_OTHER): Payer: Medicare Other

## 2022-03-19 VITALS — BP 130/62 | HR 69 | Temp 97.7°F | Ht 66.5 in | Wt 190.2 lb

## 2022-03-19 DIAGNOSIS — Z0000 Encounter for general adult medical examination without abnormal findings: Secondary | ICD-10-CM

## 2022-03-19 NOTE — Patient Instructions (Signed)
[name] , Thank you for taking time to come for your Medicare Wellness Visit. I appreciate your ongoing commitment to your health goals. Please review the following plan we discussed and let me know if I can assist you in the future.   These are the goals we discussed:  Goals      Patient Stated     Maintain current health     Patient Stated     03/19/2022, no goals        This is a list of the screening recommended for you and due dates:  Health Maintenance  Topic Date Due   Hemoglobin A1C  01/03/2022   Yearly kidney health urinalysis for diabetes  03/30/2022   Zoster (Shingles) Vaccine (1 of 2) 07/18/2022*   Eye exam for diabetics  05/19/2022   Yearly kidney function blood test for diabetes  07/04/2022   Complete foot exam   07/04/2022   Medicare Annual Wellness Visit  03/20/2023   DTaP/Tdap/Td vaccine (2 - Td or Tdap) 05/10/2024   Colon Cancer Screening  06/28/2026   Pneumonia Vaccine  Completed   Flu Shot  Completed   Hepatitis C Screening: USPSTF Recommendation to screen - Ages 18-79 yo.  Completed   HPV Vaccine  Aged Out   COVID-19 Vaccine  Discontinued  *Topic was postponed. The date shown is not the original due date.    Advanced directives: Advance directive discussed with you today. Even though you declined this today please call our office should you change your mind and we can give you the proper paperwork for you to fill out.  Conditions/risks identified: none  Next appointment: Follow up in one year for your annual wellness visit.   Preventive Care 74 Years and Older, Male  Preventive care refers to lifestyle choices and visits with your health care provider that can promote health and wellness. What does preventive care include? A yearly physical exam. This is also called an annual well check. Dental exams once or twice a year. Routine eye exams. Ask your health care provider how often you should have your eyes checked. Personal lifestyle choices,  including: Daily care of your teeth and gums. Regular physical activity. Eating a healthy diet. Avoiding tobacco and drug use. Limiting alcohol use. Practicing safe sex. Taking low doses of aspirin every day. Taking vitamin and mineral supplements as recommended by your health care provider. What happens during an annual well check? The services and screenings done by your health care provider during your annual well check will depend on your age, overall health, lifestyle risk factors, and family history of disease. Counseling  Your health care provider may ask you questions about your: Alcohol use. Tobacco use. Drug use. Emotional well-being. Home and relationship well-being. Sexual activity. Eating habits. History of falls. Memory and ability to understand (cognition). Work and work Statistician. Screening  You may have the following tests or measurements: Height, weight, and BMI. Blood pressure. Lipid and cholesterol levels. These may be checked every 5 years, or more frequently if you are over 54 years old. Skin check. Lung cancer screening. You may have this screening every year starting at age 46 if you have a 30-pack-year history of smoking and currently smoke or have quit within the past 15 years. Fecal occult blood test (FOBT) of the stool. You may have this test every year starting at age 27. Flexible sigmoidoscopy or colonoscopy. You may have a sigmoidoscopy every 5 years or a colonoscopy every 10 years starting at  age 96. Prostate cancer screening. Recommendations will vary depending on your family history and other risks. Hepatitis C blood test. Hepatitis B blood test. Sexually transmitted disease (STD) testing. Diabetes screening. This is done by checking your blood sugar (glucose) after you have not eaten for a while (fasting). You may have this done every 1-3 years. Abdominal aortic aneurysm (AAA) screening. You may need this if you are a current or former  smoker. Osteoporosis. You may be screened starting at age 51 if you are at high risk. Talk with your health care provider about your test results, treatment options, and if necessary, the need for more tests. Vaccines  Your health care provider may recommend certain vaccines, such as: Influenza vaccine. This is recommended every year. Tetanus, diphtheria, and acellular pertussis (Tdap, Td) vaccine. You may need a Td booster every 10 years. Zoster vaccine. You may need this after age 6. Pneumococcal 13-valent conjugate (PCV13) vaccine. One dose is recommended after age 68. Pneumococcal polysaccharide (PPSV23) vaccine. One dose is recommended after age 33. Talk to your health care provider about which screenings and vaccines you need and how often you need them. This information is not intended to replace advice given to you by your health care provider. Make sure you discuss any questions you have with your health care provider. Document Released: 03/18/2015 Document Revised: 11/09/2015 Document Reviewed: 12/21/2014 Elsevier Interactive Patient Education  2017 [place] Prevention in the Home Falls can cause injuries. They can happen to people of all ages. There are many things you can do to make your home safe and to help prevent falls. What can I do on the outside of my home? Regularly fix the edges of walkways and driveways and fix any cracks. Remove anything that might make you trip as you walk through a door, such as a raised step or threshold. Trim any bushes or trees on the path to your home. Use bright outdoor lighting. Clear any walking paths of anything that might make someone trip, such as rocks or tools. Regularly check to see if handrails are loose or broken. Make sure that both sides of any steps have handrails. Any raised decks and porches should have guardrails on the edges. Have any leaves, snow, or ice cleared regularly. Use sand or salt on walking paths during  winter. Clean up any spills in your garage right away. This includes oil or grease spills. What can I do in the bathroom? Use night lights. Install grab bars by the toilet and in the tub and shower. Do not use towel bars as grab bars. Use non-skid mats or decals in the tub or shower. If you need to sit down in the shower, use a plastic, non-slip stool. Keep the floor dry. Clean up any water that spills on the floor as soon as it happens. Remove soap buildup in the tub or shower regularly. Attach bath mats securely with double-sided non-slip rug tape. Do not have throw rugs and other things on the floor that can make you trip. What can I do in the bedroom? Use night lights. Make sure that you have a light by your bed that is easy to reach. Do not use any sheets or blankets that are too big for your bed. They should not hang down onto the floor. Have a firm chair that has side arms. You can use this for support while you get dressed. Do not have throw rugs and other things on the floor that can make  you trip. What can I do in the kitchen? Clean up any spills right away. Avoid walking on wet floors. Keep items that you use a lot in easy-to-reach places. If you need to reach something above you, use a strong step stool that has a grab bar. Keep electrical cords out of the way. Do not use floor polish or wax that makes floors slippery. If you must use wax, use non-skid floor wax. Do not have throw rugs and other things on the floor that can make you trip. What can I do with my stairs? Do not leave any items on the stairs. Make sure that there are handrails on both sides of the stairs and use them. Fix handrails that are broken or loose. Make sure that handrails are as long as the stairways. Check any carpeting to make sure that it is firmly attached to the stairs. Fix any carpet that is loose or worn. Avoid having throw rugs at the top or bottom of the stairs. If you do have throw rugs,  attach them to the floor with carpet tape. Make sure that you have a light switch at the top of the stairs and the bottom of the stairs. If you do not have them, ask someone to add them for you. What else can I do to help prevent falls? Wear shoes that: Do not have high heels. Have rubber bottoms. Are comfortable and fit you well. Are closed at the toe. Do not wear sandals. If you use a stepladder: Make sure that it is fully opened. Do not climb a closed stepladder. Make sure that both sides of the stepladder are locked into place. Ask someone to hold it for you, if possible. Clearly mark and make sure that you can see: Any grab bars or handrails. First and last steps. Where the edge of each step is. Use tools that help you move around (mobility aids) if they are needed. These include: Canes. Walkers. Scooters. Crutches. Turn on the lights when you go into a dark area. Replace any light bulbs as soon as they burn out. Set up your furniture so you have a clear path. Avoid moving your furniture around. If any of your floors are uneven, fix them. If there are any pets around you, be aware of where they are. Review your medicines with your doctor. Some medicines can make you feel dizzy. This can increase your chance of falling. Ask your doctor what other things that you can do to help prevent falls. This information is not intended to replace advice given to you by your health care provider. Make sure you discuss any questions you have with your health care provider. Document Released: 12/16/2008 Document Revised: 07/28/2015 Document Reviewed: 03/26/2014 Elsevier Interactive Patient Education  2017 [employer].

## 2022-03-19 NOTE — Progress Notes (Signed)
Subjective:   Kurt Kidd is a 72 y.o. male who presents for Medicare Annual/Subsequent preventive examination.  Review of Systems     Cardiac Risk Factors include: advanced age (>77mn, >>26women);diabetes mellitus;dyslipidemia;hypertension;male gender;obesity (BMI >30kg/m2)     Objective:    Today's Vitals   03/19/22 0809 03/19/22 0816  BP: 130/62   Pulse: 69   Temp: 97.7 F (36.5 C)   TempSrc: Oral   SpO2: 97%   Weight: 190 lb 3.2 oz (86.3 kg)   Height: 5' 6.5" (1.689 m)   PainSc:  6    Body mass index is 30.24 kg/m.     03/19/2022    8:20 AM 03/15/2021    8:32 AM 02/25/2020    6:48 AM 02/19/2020    1:31 PM 10/07/2019   10:36 AM 07/13/2016    8:05 AM 07/04/2015   11:23 AM  Advanced Directives  Does Patient Have a Medical Advance Directive? No No No No No No No  Would patient like information on creating a medical advance directive? No - Patient declined No - Patient declined No - Patient declined No - Patient declined No - Patient declined No - Patient declined     Current Medications (verified) Outpatient Encounter Medications as of 03/19/2022  Medication Sig   atorvastatin (LIPITOR) 80 MG tablet Take 1 tablet by mouth once daily   Blood Glucose Monitoring Suppl (ONE TOUCH ULTRA SYSTEM KIT) W/DEVICE KIT 1 kit by Does not apply route once.   esomeprazole (NEXIUM) 20 MG capsule Take 20 mg by mouth daily at 12 noon.   ezetimibe (ZETIA) 10 MG tablet Take 1 tablet by mouth once daily   irbesartan (AVAPRO) 75 MG tablet Take 1 tablet (75 mg total) by mouth daily.   metFORMIN (GLUCOPHAGE) 500 MG tablet TAKE 1 TABLET BY MOUTH THREE TIMES DAILY   metoprolol tartrate (LOPRESSOR) 50 MG tablet Take 1 tablet by mouth twice daily   Multiple Vitamins-Minerals (MULTIVITAMIN WITH MINERALS) tablet Take 1 tablet by mouth daily.   nitroGLYCERIN (NITROSTAT) 0.4 MG SL tablet Place 1 tablet (0.4 mg total) under the tongue every 5 (five) minutes as needed.   tamsulosin (FLOMAX) 0.4 MG  CAPS capsule Take 1 capsule by mouth once daily   doxycycline (VIBRAMYCIN) 100 MG capsule Take 100 mg by mouth 2 (two) times daily. (Patient not taking: Reported on 03/19/2022)   No facility-administered encounter medications on file as of 03/19/2022.    Allergies (verified) Patient has no known allergies.   History: Past Medical History:  Diagnosis Date   Blood transfusion without reported diagnosis    CAD (coronary artery disease)    DES.. 2004 /  catheterization 2008, 10% narrowing stent site, pain not cardiac   [place]    Colon polyp    Degenerative joint disease    Diabetes mellitus 2012   Diverticulosis    Dyslipidemia    Ejection fraction    65-70%, echo, March, 2012   GERD (gastroesophageal reflux disease)    Hyperlipidemia    Hypertension    Mitral regurgitation    Echo, mild, 2012   Murmur    Onychomycosis    Overweight(278.02)    Palpitations    Rapid heartbeat and shortness of breath after eating 2008 recurrent episode 2012   Past Surgical History:  Procedure Laterality Date   ANGIOPLASTY / STENTING FEMORAL  09/09/2002   CARDIAC CATHETERIZATION     COLONOSCOPY  2014   Dr BBary Castilla  COLONOSCOPY  11/26/2007  Dr Elvera Maria COLONIC MUCOSA WITH HYPEREMIA AND   COLONOSCOPY WITH PROPOFOL N/A 07/13/2016   Procedure: COLONOSCOPY WITH PROPOFOL;  Surgeon: Robert Bellow, MD;  Location[facility] END[facility]COPY;  Service: Endoscopy;  Laterality: N/A;   ESOPHAGOGASTRODUODENOSCOPY (EGD) WITH PROPOFOL N/A 07/13/2016   Procedure: ESOPHAGOGASTRODUODENOSCOPY (EGD) WITH PROPOFOL;  Surgeon: Robert Bellow, MD;  Location: [facility] ENDOSCOPY;  Service: Endoscopy;  Laterality: N/A;   TOTAL ANKLE ARTHROPLASTY Left 02/25/2020   Procedure: TOTAL ANKLE ARTHROPLASTY;  Surgeon: Wylene Simmer, MD;  Location: [place];  Service: Orthopedics;  Laterality: Left;   UPPER GI ENDOSCOPY  11/26/2007   Dr Sharlett Iles   Family History  Problem Relation Age of Onset    Heart failure Mother    Heart disease Mother    Diabetes Father        father also had ASHD/ CABG   Heart disease Father    Colon cancer Brother 69   AAA (abdominal aortic aneurysm) Brother 58   Esophageal cancer Neg Hx    Rectal cancer Neg Hx    Stomach cancer Neg Hx    Social History   Socioeconomic History   Marital status: Married    Spouse name: Not on file   Number of children: Not on file   Years of education: Not on file   Highest education level: Not on file  Occupational History   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    Employer: Manufacturing engineer  Tobacco Use   Smoking status: Former    Packs/day: 1.00    Years: 26.00    Total pack years: 26.00    Types: Cigarettes    Quit date: 03/05/1990    Years since quitting: 32.0   Smokeless tobacco: Never  Vaping Use   Vaping Use: Never used  Substance and Sexual Activity   Alcohol use: Yes    Alcohol/week: 0.0 standard drinks of alcohol    Comment: 2 drinks per day   Drug use: No   Sexual activity: Not on file  Other Topics Concern   Not on file  Social History Narrative   Not on file   Social Determinants of Health   Financial Resource Strain: Low Risk  (03/19/2022)   Overall Financial Resource Strain (CARDIA)    Difficulty of Paying Living Expenses: Not hard at all  Food Insecurity: No Food Insecurity (03/19/2022)   Hunger Vital Sign    Worried About Running Out of Food in the Last Year: Never true    Ran Out of Food in the Last Year: Never true  Transportation Needs: No Transportation Needs (03/19/2022)   PRAPARE - Hydrologist (Medical): No    Lack of Transportation (Non-Medical): No  Physical Activity: Inactive (03/19/2022)   Exercise Vital Sign    Days of Exercise per Week: 0 days    Minutes of Exercise per Session: 0 min  Stress: No Stress Concern Present (03/19/2022)   [place]    Feeling of Stress : Not at  all  Social Connections: Moderately Isolated (03/15/2021)   Social Connection and Isolation Panel [NHANES]    Frequency of Communication with Friends and Family: Twice a week    Frequency of Social Gatherings with Friends and Family: Twice a week    Attends Religious Services: Never    Marine scientist or Organizations: No    Attends Archivist Meetings: Never    Marital Status: Married  Tobacco Counseling Counseling given: Not Answered   Clinical Intake:  Pre-visit preparation completed: Yes  Pain : 0-10 Pain Score: 6  Pain Type: Chronic pain Pain Location: Back Pain Orientation: Lower Pain Descriptors / Indicators: Aching Pain Onset: More than a month ago Pain Frequency: Constant     Nutritional Status: BMI > 30  Obese Nutritional Risks: None Diabetes: Yes  How often do you need to have someone help you when you read instructions, pamphlets, or other written materials from your doctor or pharmacy?: 1 - Never  Diabetic? Yes Nutrition Risk Assessment:  Has the patient had any N/V/D within the last 2 months?  No  Does the patient have any non-healing wounds?  No  Has the patient had any unintentional weight loss or weight gain?  No   Diabetes:  Is the patient diabetic?  Yes  If diabetic, was a CBG obtained today?  No  Did the patient bring in their glucometer from home?  No  How often do you monitor your CBG's? Does not.   Financial Strains and Diabetes Management:  Are you having any financial strains with the device, your supplies or your medication? No .  Does the patient want to be seen by Chronic Care Management for management of their diabetes?  No  Would the patient like to be referred to a Nutritionist or for Diabetic Management?  No   Diabetic Exams:  Diabetic Eye Exam: Completed 05/18/2021 Diabetic Foot Exam: Completed 07/03/2021   Interpreter Needed?: No  Information entered by :: NAllen LPN   Activities of Daily Living     03/19/2022    8:21 AM  In your present state of health, do you have any difficulty performing the following activities:  Hearing? 0  Vision? 0  Difficulty concentrating or making decisions? 0  Walking or climbing stairs? 0  Dressing or bathing? 0  Doing errands, shopping? 0  Preparing Food and eating ? N  Using the Toilet? N  In the past six months, have you accidently leaked urine? Y  Do you have problems with loss of bowel control? N  Managing your Medications? N  Managing your Finances? N  Housekeeping or managing your Housekeeping? N    Patient Care Team: Libby Maw, MD as PCP - General (Family Medicine) Debara Pickett Nadean Corwin, MD as PCP - Cardiology (Cardiology) Noralee Space, MD as Consulting Physician (Pulmonary Disease) Bary Castilla Forest Gleason, MD (General Surgery)  Indicate any recent Medical Services you may have received from other than [facility] providers in the past year (date may be approximate).     Assessment:   This is a routine wellness examination for Charan.  Hearing/Vision screen Vision Screening - Comments:: Regular eye exams, [place] [facility]  Dietary issues and exercise activities discussed: Current Exercise Habits: The patient has a physically strenuous job, but has no regular exercise apart from work.   Goals Addressed             This Visit's Progress    Patient Stated       03/19/2022, no goals       Depression Screen    03/19/2022    8:21 AM 12/07/2021   11:08 AM 07/03/2021    8:17 AM 03/30/2021    8:19 AM 03/15/2021    8:34 AM 10/17/2020    9:25 AM 10/07/2019   10:38 AM  PHQ 2/9 Scores  PHQ - 2 Score 0 0 0 0 0 0 0    Fall Risk  03/19/2022    8:20 AM 12/07/2021   11:08 AM 07/03/2021    8:17 AM 03/30/2021    8:20 AM 03/15/2021    8:34 AM  Fall Risk   Falls in the past year? 0 0 0 0 0  Number falls in past yr: 0 0 0 0 0  Injury with Fall? 0    0  Risk for fall due to : Medication side effect      Follow up Falls prevention  discussed;Education provided;Falls evaluation completed    Falls evaluation completed    FALL RISK PREVENTION PERTAINING TO THE HOME:  Any stairs in or around the home? Yes  If so, are there any without handrails? No  Home free of loose throw rugs in walkways, pet beds, electrical cords, etc? Yes  Adequate lighting in your home to reduce risk of falls? Yes   ASSISTIVE DEVICES UTILIZED TO PREVENT FALLS:  Life alert? No  Use of a cane, walker or w/c? No  Grab bars in the bathroom? Yes  Shower chair or bench in shower? Yes  Elevated toilet seat or a handicapped toilet? Yes   TIMED UP AND GO:  Was the test performed? Yes .  Length of time to ambulate 10 feet: 5 sec.   Gait slow and steady without use of assistive device  Cognitive Function:        03/19/2022    8:22 AM  6CIT Screen  What Year? 0 points  What month? 0 points  What time? 0 points  Count back from 20 0 points  Months in reverse 0 points  Repeat phrase 4 points  Total Score 4 points    Immunizations Immunization History  Administered Date(s) Administered   Fluad Quad(high Dose 65+) 02/16/2019, 03/30/2021   Influenza Split 12/26/2011   Influenza Whole 12/03/2008   Influenza, High Dose Seasonal PF 11/21/2015, 11/26/2016, 12/02/2017, 12/07/2021   Influenza,inj,Quad PF,6+ Mos 12/29/2012, 11/23/2013, 11/16/2014   PFIZER(Purple Top)SARS-COV-2 Vaccination 05/04/2019, 05/25/2019   PNEUMOCOCCAL CONJUGATE-20 03/30/2021   Pneumococcal Conjugate-13 05/27/2017   Tdap 05/11/2014    TDAP status: Up to date  Flu Vaccine status: Up to date  Pneumococcal vaccine status: Up to date  Covid-19 vaccine status: Completed vaccines  Qualifies for Shingles Vaccine? Yes   Zostavax completed No   Shingrix Completed?: No.    Education has been provided regarding the importance of this vaccine. Patient has been advised to call insurance company to determine out of pocket expense if they have not yet received this vaccine.  Advised may also receive vaccine at local pharmacy or Health Dept. Verbalized acceptance and understanding.  Screening Tests Health Maintenance  Topic Date Due   HEMOGLOBIN A1C  01/03/2022   Medicare Annual Wellness (AWV)  03/15/2022   Diabetic kidney evaluation - Urine ACR  03/30/2022   Zoster Vaccines- Shingrix (1 of 2) 07/18/2022 (Originally 11/19/1969)   OPHTHALMOLOGY EXAM  05/19/2022   Diabetic kidney evaluation - eGFR measurement  07/04/2022   FOOT EXAM  07/04/2022   DTaP/Tdap/Td (2 - Td or Tdap) 05/10/2024   COLONOSCOPY (Pts 45-77yr Insurance coverage will need to be confirmed)  06/28/2026   Pneumonia Vaccine 72 Years old  Completed   INFLUENZA VACCINE  Completed   Hepatitis C Screening  Completed   HPV VACCINES  Aged Out   COVID-19 Vaccine  Discontinued    Health Maintenance  Health Maintenance Due  Topic Date Due   HEMOGLOBIN A1C  01/03/2022   Medicare Annual Wellness (AWV)  03/15/2022  Diabetic kidney evaluation - Urine ACR  03/30/2022    Colorectal cancer screening: Type of screening: Colonoscopy. Completed 06/27/2021. Repeat every 5 years  Lung Cancer Screening: (Low Dose CT Chest recommended if Age 59-80 years, 30 pack-year currently smoking OR have quit w/in 15years.) does not qualify.   Lung Cancer Screening Referral: no  Additional Screening:  Hepatitis C Screening: does qualify; Completed 03/30/2021  Vision Screening: Recommended annual ophthalmology exams for early detection of glaucoma and other disorders of the eye. Is the patient up to date with their annual eye exam?  Yes  Who is the provider or what is the name of the office in which the patient attends annual eye exams? [facility] If pt is not established with a provider, would they like to be referred to a provider to establish care? No .   Dental Screening: Recommended annual dental exams for proper oral hygiene  Community Resource Referral / Chronic Care Management: CRR required this  visit?  No   CCM required this visit?  No      Plan:     I have personally reviewed and noted the following in the patient's chart:   Medical and social history Use of alcohol, tobacco or illicit drugs  Current medications and supplements including opioid prescriptions. Patient is not currently taking opioid prescriptions. Functional ability and status Nutritional status Physical activity Advanced directives List of other physicians Hospitalizations, surgeries, and ER visits in previous 12 months Vitals Screenings to include cognitive, depression, and falls Referrals and appointments  In addition, I have reviewed and discussed with patient certain preventive protocols, quality metrics, and best practice recommendations. A written personalized care plan for preventive services as well as general preventive health recommendations were provided to patient.     Kellie Simmering, LPN   11/01/5619   Nurse Notes: none

## 2022-04-27 ENCOUNTER — Other Ambulatory Visit (HOSPITAL_COMMUNITY): Payer: Self-pay | Admitting: Internal Medicine

## 2022-04-27 DIAGNOSIS — I6523 Occlusion and stenosis of bilateral carotid arteries: Secondary | ICD-10-CM

## 2022-04-30 ENCOUNTER — Ambulatory Visit (HOSPITAL_COMMUNITY)
Admission: RE | Admit: 2022-04-30 | Discharge: 2022-04-30 | Disposition: A | Discharge status: Home / Self Care | Payer: Medicare Other | Source: Ambulatory Visit | Attending: Cardiology | Admitting: Cardiology

## 2022-04-30 DIAGNOSIS — I6523 Occlusion and stenosis of bilateral carotid arteries: Secondary | ICD-10-CM | POA: Insufficient documentation

## 2022-05-01 ENCOUNTER — Other Ambulatory Visit: Payer: Self-pay | Admitting: *Deleted

## 2022-05-01 DIAGNOSIS — I6523 Occlusion and stenosis of bilateral carotid arteries: Secondary | ICD-10-CM

## 2022-05-07 ENCOUNTER — Ambulatory Visit (INDEPENDENT_AMBULATORY_CARE_PROVIDER_SITE_OTHER): Payer: PPO | Admitting: Vascular Surgery

## 2022-05-07 ENCOUNTER — Encounter: Payer: Self-pay | Admitting: Vascular Surgery

## 2022-05-07 VITALS — BP 142/79 | HR 62 | Temp 98.1°F | Resp 18 | Ht 66.0 in | Wt 188.0 lb

## 2022-05-07 DIAGNOSIS — I6523 Occlusion and stenosis of bilateral carotid arteries: Secondary | ICD-10-CM

## 2022-05-07 NOTE — Progress Notes (Signed)
[facility] [place]  ASSESSMENT / PLAN: Kurt Kidd is a 72 y.o. male with asymptomatic bilateral carotid artery stenosis (R 40-59%; L 60-79%).   The patient should continue best medical therapy for carotid artery stenosis including: Complete cessation from all tobacco products. Blood glucose control with goal A1c < 7%. Blood pressure control with goal blood pressure < 140/90 mmHg. Lipid reduction therapy with goal LDL-C <100 mg/dL (<70 if symptomatic from carotid artery stenosis).  Aspirin '81mg'$  PO QD.  Atorvastatin 40-'80mg'$  PO QD (or other "high intensity" statin therapy).  Follow up with me in 6 months. Will check carotid duplex, ABI and AAA at that time.  CHIEF COMPLAINT: asymptomatic carotid stenosis  HISTORY OF PRESENT ILLNESS: Kurt Kidd is a 72 y.o. male referred to clinic to evaluate carotid artery stenosis. Dr. Debara Pickett has been following this for some time. He has no symptoms of stroke or mini-stroke. No facial droop, visual changes, no unilateral weakness. We spent the bulk of the visit reviewing the natural history of carotid artery stenosis. I explained the rationale for intervention in asymptomatic patients at 80%. We reviewed the plan for surveillance.    Past Medical History:  Diagnosis Date   Blood transfusion without reported diagnosis    CAD (coronary artery disease)    DES.. 2004 /  catheterization 2008, 10% narrowing stent site, pain not cardiac   Colitis, ischemic ([place])    Colon polyp    Degenerative joint disease    Diabetes mellitus 2012   Diverticulosis    Dyslipidemia    Ejection fraction    65-70%, echo, March, 2012   GERD (gastroesophageal reflux disease)    Hyperlipidemia    Hypertension    Mitral regurgitation    Echo, mild, 2012   Murmur    Onychomycosis    Overweight(278.02)    Palpitations    Rapid heartbeat and shortness of breath after eating 2008 recurrent episode 2012    Past Surgical History:  Procedure  Laterality Date   ANGIOPLASTY / STENTING FEMORAL  09/09/2002   CARDIAC CATHETERIZATION     COLONOSCOPY  2014   Dr Bary Castilla   COLONOSCOPY  11/26/2007   Dr Patterson/POLYPOID COLONIC MUCOSA WITH HYPEREMIA AND   COLONOSCOPY WITH PROPOFOL N/A 07/13/2016   Procedure: COLONOSCOPY WITH PROPOFOL;  Surgeon: Robert Bellow, MD;  Location: [facility];  Service: Endoscopy;  Laterality: N/A;   ESOPHAGOGASTRODUODENOSCOPY (EGD) WITH PROPOFOL N/A 07/13/2016   Procedure: ESOPHAGOGASTRODUODENOSCOPY (EGD) WITH PROPOFOL;  Surgeon: Robert Bellow, MD;  Location: [facility];  Service: Endoscopy;  Laterality: N/A;   TOTAL ANKLE ARTHROPLASTY Left 02/25/2020   Procedure: TOTAL ANKLE ARTHROPLASTY;  Surgeon: Wylene Simmer, MD;  Location: [place];  Service: Orthopedics;  Laterality: Left;   UPPER GI ENDOSCOPY  11/26/2007   Dr Sharlett Iles    Family History  Problem Relation Age of Onset   Heart failure Mother    Heart disease Mother    Diabetes Father        father also had ASHD/ CABG   Heart disease Father    Colon cancer Brother 39   AAA (abdominal aortic aneurysm) Brother 34   Esophageal cancer Neg Hx    Rectal cancer Neg Hx    Stomach cancer Neg Hx     Social History   Socioeconomic History   Marital status: Married    Spouse name: Not on file   Number of children: Not on file   Years of education: Not on  file   Highest education level: Not on file  Occupational History   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    Employer: Manufacturing engineer  Tobacco Use   Smoking status: Former    Packs/day: 1.00    Years: 26.00    Total pack years: 26.00    Types: Cigarettes    Quit date: 03/05/1990    Years since quitting: 32.1   Smokeless tobacco: Never  Vaping Use   Vaping Use: Never used  Substance and Sexual Activity   Alcohol use: Yes    Alcohol/week: 0.0 standard drinks of alcohol    Comment: 2 drinks per day   Drug use: No   Sexual activity: Not on file  Other Topics  Concern   Not on file  Social History Narrative   Not on file   Social Determinants of Health   Financial Resource Strain: Low Risk  (03/19/2022)   Overall Financial Resource Strain (CARDIA)    Difficulty of Paying Living Expenses: Not hard at all  Food Insecurity: No Food Insecurity (03/19/2022)   Hunger Vital Sign    Worried About Running Out of Food in the Last Year: Never true    Ran Out of Food in the Last Year: Never true  Transportation Needs: No Transportation Needs (03/19/2022)   PRAPARE - Hydrologist (Medical): No    Lack of Transportation (Non-Medical): No  Physical Activity: Inactive (03/19/2022)   Exercise Vital Sign    Days of Exercise per Week: 0 days    Minutes of Exercise per Session: 0 min  Stress: No Stress Concern Present (03/19/2022)   [place]    Feeling of Stress : Not at all  Social Connections: Moderately Isolated (03/15/2021)   Social Connection and Isolation Panel [NHANES]    Frequency of Communication with Friends and Family: Twice a week    Frequency of Social Gatherings with Friends and Family: Twice a week    Attends Religious Services: Never    Marine scientist or Organizations: No    Attends Archivist Meetings: Never    Marital Status: Married  Human resources officer Violence: Not At Risk (03/15/2021)   Humiliation, Afraid, Rape, and Kick questionnaire    Fear of Current or Ex-Partner: No    Emotionally Abused: No    Physically Abused: No    Sexually Abused: No    No Known Allergies  Current Outpatient Medications  Medication Sig Dispense Refill   atorvastatin (LIPITOR) 80 MG tablet Take 1 tablet by mouth once daily 90 tablet 0   Blood Glucose Monitoring Suppl (ONE TOUCH ULTRA SYSTEM KIT) W/DEVICE KIT 1 kit by Does not apply route once. 1 each 0   esomeprazole (NEXIUM) 20 MG capsule Take 20 mg by mouth daily at 12 noon.     ezetimibe  (ZETIA) 10 MG tablet Take 1 tablet by mouth once daily 90 tablet 2   irbesartan (AVAPRO) 75 MG tablet Take 1 tablet (75 mg total) by mouth daily. 30 tablet 1   metFORMIN (GLUCOPHAGE) 500 MG tablet TAKE 1 TABLET BY MOUTH THREE TIMES DAILY 270 tablet 0   metoprolol tartrate (LOPRESSOR) 50 MG tablet Take 1 tablet by mouth twice daily 180 tablet 2   Multiple Vitamins-Minerals (MULTIVITAMIN WITH MINERALS) tablet Take 1 tablet by mouth daily.     nitroGLYCERIN (NITROSTAT) 0.4 MG SL tablet Place 1 tablet (0.4 mg total) under the tongue every 5 (five) minutes  as needed. 25 tablet 1   tamsulosin (FLOMAX) 0.4 MG CAPS capsule Take 1 capsule by mouth once daily 90 capsule 3   doxycycline (VIBRAMYCIN) 100 MG capsule Take 100 mg by mouth 2 (two) times daily. (Patient not taking: Reported on 03/19/2022)     No current facility-administered medications for this visit.    PHYSICAL EXAM Vitals:   05/07/22 0803 05/07/22 0805  BP: (!) 141/76 (!) 142/79  Pulse: 62 62  Resp: 18   Temp: 98.1 F (36.7 C)   TempSrc: Temporal   SpO2: 98%   Weight: 188 lb (85.3 kg)   Height: '5\' 6"'$  (1.676 m)    Well appearing man in no distress Regular rate and rhythm Unlabored breathing 2+ radial pulses Weakly palpable PT bilaterally Anterior L ankle scar  PERTINENT LABORATORY AND RADIOLOGIC DATA  Most recent CBC    Latest Ref Rng & Units 03/30/2021    9:18 AM 10/17/2020    9:53 AM 11/23/2019    9:30 AM  CBC  WBC 4.0 - 10.5 K/uL 7.0  7.1  6.7   Hemoglobin 13.0 - 17.0 g/dL 14.5  13.6  13.9   Hematocrit 39.0 - 52.0 % 42.6  39.5  40.9   Platelets 150.0 - 400.0 K/uL 299.0  268.0  303.0      Most recent CMP    Latest Ref Rng & Units 07/03/2021    9:30 AM 05/16/2021    8:23 AM 03/30/2021    9:18 AM  CMP  Glucose 70 - 99 mg/dL 133  148  142   BUN 6 - 23 mg/dL '13  13    13  15   '$ Creatinine 0.40 - 1.50 mg/dL 0.65  CANCELED    0.68    0.66  0.69   Sodium 135 - 145 mEq/L 138  138  138   Potassium 3.5 - 5.1 mEq/L 4.5   4.9  4.3   Chloride 96 - 112 mEq/L 101  102  102   CO2 19 - 32 mEq/L '28  30  28   '$ Calcium 8.4 - 10.5 mg/dL 9.7  9.8  10.1   Total Protein 6.0 - 8.5 g/dL 6.8   7.4   Total Bilirubin 0.2 - 1.2 mg/dL   0.6   Alkaline Phos 39 - 117 U/L   62   AST 0 - 37 U/L   21   ALT 0 - 53 U/L   28     Renal function CrCl cannot be calculated (Patient's most recent lab result is older than the maximum 21 days allowed.).  Hgb A1c MFr Bld (%)  Date Value  07/03/2021 6.7 (H)    LDL Cholesterol  Date Value Ref Range Status  07/03/2021 114 (H) 0 - 99 mg/dL Final   Direct LDL  Date Value Ref Range Status  10/17/2020 82.0 mg/dL Final    Comment:    Optimal:  <100 mg/dLNear or Above Optimal:  100-129 mg/dLBorderline High:  130-159 mg/dLHigh:  160-189 mg/dLVery High:  >190 mg/dL     Vascular Imaging:  Right Carotid: Velocities in the right ICA are consistent with a 40-59%                 stenosis. Non-hemodynamically significant plaque <50% noted  in                the CCA.   Left Carotid: Velocities in the left ICA are consistent with a 60-79%  stenosis.  Non-hemodynamically significant plaque <50% noted in the  CCA.   Vertebrals: Bilateral vertebral arteries demonstrate antegrade flow.  Subclavians: Normal flow hemodynamics were seen in bilateral subclavian               arteries.   Yevonne Aline. Stanford Breed, MD FACS [facility] [place] Phone Number: [phone] 05/07/2022 9:41 AM   Total time spent on preparing this encounter including chart review, data review, collecting history, examining the patient, coordinating care for this new patient, 60 minutes.  Portions of this report may have been transcribed using voice recognition software.  Every effort has been made to ensure accuracy; however, inadvertent computerized transcription errors may still be present.

## 2022-05-08 ENCOUNTER — Other Ambulatory Visit: Payer: Self-pay

## 2022-05-08 DIAGNOSIS — Z8249 Family history of ischemic heart disease and other diseases of the circulatory system: Secondary | ICD-10-CM

## 2022-05-08 DIAGNOSIS — E782 Mixed hyperlipidemia: Secondary | ICD-10-CM

## 2022-05-08 DIAGNOSIS — I6523 Occlusion and stenosis of bilateral carotid arteries: Secondary | ICD-10-CM

## 2022-05-21 ENCOUNTER — Other Ambulatory Visit: Payer: Self-pay | Admitting: Family Medicine

## 2022-05-21 DIAGNOSIS — E782 Mixed hyperlipidemia: Secondary | ICD-10-CM

## 2022-05-22 DIAGNOSIS — E119 Type 2 diabetes mellitus without complications: Secondary | ICD-10-CM | POA: Diagnosis not present

## 2022-05-22 DIAGNOSIS — H52203 Unspecified astigmatism, bilateral: Secondary | ICD-10-CM | POA: Diagnosis not present

## 2022-05-22 DIAGNOSIS — H5203 Hypermetropia, bilateral: Secondary | ICD-10-CM | POA: Diagnosis not present

## 2022-05-22 LAB — HM DIABETES EYE EXAM

## 2022-05-24 ENCOUNTER — Other Ambulatory Visit: Payer: Self-pay

## 2022-05-24 DIAGNOSIS — I6523 Occlusion and stenosis of bilateral carotid arteries: Secondary | ICD-10-CM

## 2022-05-29 ENCOUNTER — Encounter: Payer: Self-pay | Admitting: Family Medicine

## 2022-06-04 ENCOUNTER — Ambulatory Visit (HOSPITAL_BASED_OUTPATIENT_CLINIC_OR_DEPARTMENT_OTHER)
Admission: RE | Admit: 2022-06-04 | Discharge: 2022-06-04 | Disposition: A | Discharge status: Home / Self Care | Payer: PPO | Source: Ambulatory Visit | Attending: Vascular Surgery | Admitting: Vascular Surgery

## 2022-06-04 ENCOUNTER — Ambulatory Visit (HOSPITAL_BASED_OUTPATIENT_CLINIC_OR_DEPARTMENT_OTHER): Payer: PPO

## 2022-06-04 ENCOUNTER — Encounter (HOSPITAL_BASED_OUTPATIENT_CLINIC_OR_DEPARTMENT_OTHER): Payer: Self-pay

## 2022-06-04 DIAGNOSIS — I6503 Occlusion and stenosis of bilateral vertebral arteries: Secondary | ICD-10-CM | POA: Diagnosis not present

## 2022-06-04 DIAGNOSIS — I6523 Occlusion and stenosis of bilateral carotid arteries: Secondary | ICD-10-CM | POA: Insufficient documentation

## 2022-06-04 MED ORDER — IOHEXOL 350 MG/ML SOLN
75.0000 mL | Freq: Once | INTRAVENOUS | Status: AC | PRN
  Administered 2022-06-04: 75 mL via INTRAVENOUS

## 2022-06-06 LAB — POCT I-STAT CREATININE: Creatinine, Ser: 0.8 mg/dL (ref 0.61–1.24)

## 2022-06-11 NOTE — Progress Notes (Unsigned)
[facility] [place]  ASSESSMENT / PLAN: Kurt FisherMichael E Kidd is a 72 y.o. male with asymptomatic bilateral carotid artery stenosis recent CT angiogram shows critical left carotid artery stenosis.  He remains asymptomatic.  The patient should continue best medical therapy for carotid artery stenosis including: Complete cessation from all tobacco products. Blood glucose control with goal A1c < 7%. Blood pressure control with goal blood pressure < 140/90 mmHg. Lipid reduction therapy with goal LDL-C <100 mg/dL (<28<70 if symptomatic from carotid artery stenosis).  Aspirin 81mg  PO QD.  Atorvastatin 40-80mg  PO QD (or other "high intensity" statin therapy).  Plan left carotid endarterectomy 06/18/2022.  CHIEF COMPLAINT: asymptomatic carotid stenosis  HISTORY OF PRESENT ILLNESS: Kurt Kidd is a 72 y.o. male referred to clinic to evaluate carotid artery stenosis. Dr. Rennis GoldenHilty has been following this for some time. He has no symptoms of stroke or mini-stroke. No facial droop, visual changes, no unilateral weakness. We spent the bulk of the visit reviewing the natural history of carotid artery stenosis. I explained the rationale for intervention in asymptomatic patients at 80%. We reviewed the plan for surveillance.   06/12/22: Returns to discuss CT angiogram results.  I counseled him about the worse than expected carotid stenosis in the left carotid bifurcation.  We reviewed his unique anatomy.  Ultimately I recommended a carotid endarterectomy given the low bifurcation in his neck.  Counseled him about his 1 to 2% risk of perioperative stroke, and risk of cranial nerve injury and hematoma.   Past Medical History:  Diagnosis Date   Blood transfusion without reported diagnosis    CAD (coronary artery disease)    DES.. 2004 /  catheterization 2008, 10% narrowing stent site, pain not cardiac   Colitis, ischemic ([facility])    Colon polyp    Degenerative joint disease    Diabetes mellitus  2012   Diverticulosis    Dyslipidemia    Ejection fraction    65-70%, echo, March, 2012   GERD (gastroesophageal reflux disease)    Hyperlipidemia    Hypertension    Mitral regurgitation    Echo, mild, 2012   Murmur    Onychomycosis    Overweight(278.02)    Palpitations    Rapid heartbeat and shortness of breath after eating 2008 recurrent episode 2012    Past Surgical History:  Procedure Laterality Date   ANGIOPLASTY / STENTING FEMORAL  09/09/2002   CARDIAC CATHETERIZATION     COLONOSCOPY  2014   Dr Lemar LivingsByrnett   COLONOSCOPY  11/26/2007   Dr Patterson/POLYPOID COLONIC MUCOSA WITH HYPEREMIA AND   COLONOSCOPY WITH PROPOFOL N/A 07/13/2016   Procedure: COLONOSCOPY WITH PROPOFOL;  Surgeon: Earline MayotteByrnett, Jeffrey W, MD;  Location: [facility];  Service: Endoscopy;  Laterality: N/A;   ESOPHAGOGASTRODUODENOSCOPY (EGD) WITH PROPOFOL N/A 07/13/2016   Procedure: ESOPHAGOGASTRODUODENOSCOPY (EGD) WITH PROPOFOL;  Surgeon: Earline MayotteByrnett, Jeffrey W, MD;  Location: [facility];  Service: Endoscopy;  Laterality: N/A;   TOTAL ANKLE ARTHROPLASTY Left 02/25/2020   Procedure: TOTAL ANKLE ARTHROPLASTY;  Surgeon: Toni ArthursHewitt, John, MD;  Location: [facility];  Service: Orthopedics;  Laterality: Left;   UPPER GI ENDOSCOPY  11/26/2007   Dr Jarold MottoPatterson    Family History  Problem Relation Age of Onset   Heart failure Mother    Heart disease Mother    Diabetes Father        father also had ASHD/ CABG   Heart disease Father    Colon cancer Brother [id number]   AAA (abdominal aortic aneurysm)  Brother 71   Esophageal cancer Neg Hx    Rectal cancer Neg Hx    Stomach cancer Neg Hx     Social History   Socioeconomic History   Marital status: Married    Spouse name: Not on file   Number of children: Not on file   Years of education: Not on file   Highest education level: Not on file  Occupational History   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    Employer: [employer]  Tobacco Use   Smoking status:  Former    Packs/day: 1.00    Years: 26.00    Additional pack years: 0.00    Total pack years: 26.00    Types: Cigarettes    Quit date: 03/05/1990    Years since quitting: 32.2   Smokeless tobacco: Never  Vaping Use   Vaping Use: Never used  Substance and Sexual Activity   Alcohol use: Yes    Alcohol/week: 0.0 standard drinks of alcohol    Comment: 2 drinks per day   Drug use: No   Sexual activity: Not on file  Other Topics Concern   Not on file  Social History Narrative   Not on file   Social Determinants of Health   Financial Resource Strain: Low Risk  (03/19/2022)   Overall Financial Resource Strain (CARDIA)    Difficulty of Paying Living Expenses: Not hard at all  Food Insecurity: No Food Insecurity (03/19/2022)   Hunger Vital Sign    Worried About Running Out of Food in the Last Year: Never true    Ran Out of Food in the Last Year: Never true  Transportation Needs: No Transportation Needs (03/19/2022)   PRAPARE - Transportation    Lack of Transportation (Medical): No    Lack of Transportation (Non-Medical): No  Physical Activity: Inactive (03/19/2022)   Exercise Vital Sign    Days of Exercise per Week: 0 days    Minutes of Exercise per Session: 0 min  Stress: No Stress Concern Present (03/19/2022)   [employer] - Occupational Stress Questionnaire    Feeling of Stress : Not at all  Social Connections: Moderately Isolated (03/15/2021)   Social Connection and Isolation Panel [NHANES]    Frequency of Communication with Friends and Family: Twice a week    Frequency of Social Gatherings with Friends and Family: Twice a week    Attends Religious Services: Never    Active Member of Clubs or Organizations: No    Attends Club or Organization Meetings: Never    Marital Status: Married  Intimate Partner Violence: Not At Risk (03/15/2021)   Humiliation, Afraid, Rape, and Kick questionnaire    Fear of Current or Ex-Partner: No    Emotionally Abused: No     Physically Abused: No    Sexually Abused: No    No Known Allergies  Current Outpatient Medications  Medication Sig Dispense Refill   atorvastatin (LIPITOR) 80 MG tablet Take 1 tablet by mouth once daily 90 tablet 0   Blood Glucose Monitoring Suppl (ONE TOUCH ULTRA SYSTEM KIT) W/DEVICE KIT 1 kit by Does not apply route once. 1 each 0   doxycycline (VIBRAMYCIN) 100 MG capsule Take 100 mg by mouth 2 (two) times daily. (Patient not taking: Reported on 03/19/2022)     esomeprazole (NEXIUM) 20 MG capsule Take 20 mg by mouth daily at 12 noon.     ezetimibe (ZETIA) 10 MG tablet Take 1 tablet by mouth once daily 90 tablet 2     irbesartan (AVAPRO) 75 MG tablet Take 1 tablet (75 mg total) by mouth daily. 30 tablet 1   metFORMIN (GLUCOPHAGE) 500 MG tablet TAKE 1 TABLET BY MOUTH THREE TIMES DAILY 270 tablet 0   metoprolol tartrate (LOPRESSOR) 50 MG tablet Take 1 tablet by mouth twice daily 180 tablet 2   Multiple Vitamins-Minerals (MULTIVITAMIN WITH MINERALS) tablet Take 1 tablet by mouth daily.     nitroGLYCERIN (NITROSTAT) 0.4 MG SL tablet Place 1 tablet (0.4 mg total) under the tongue every 5 (five) minutes as needed. 25 tablet 1   tamsulosin (FLOMAX) 0.4 MG CAPS capsule Take 1 capsule by mouth once daily 90 capsule 3   No current facility-administered medications for this visit.    PHYSICAL EXAM There were no vitals filed for this visit.  Well appearing man in no distress Regular rate and rhythm Unlabored breathing 2+ radial pulses Weakly palpable PT bilaterally Anterior L ankle scar  PERTINENT LABORATORY AND RADIOLOGIC DATA  Most recent CBC    Latest Ref Rng & Units 03/30/2021    9:18 AM 10/17/2020    9:53 AM 11/23/2019    9:30 AM  CBC  WBC 4.0 - 10.5 K/uL 7.0  7.1  6.7   Hemoglobin 13.0 - 17.0 g/dL 40.1  02.7  25.3   Hematocrit 39.0 - 52.0 % 42.6  39.5  40.9   Platelets 150.0 - 400.0 K/uL 299.0  268.0  303.0      Most recent CMP    Latest Ref Rng & Units 06/04/2022    1:22 PM  07/03/2021    9:30 AM 05/16/2021    8:23 AM  CMP  Glucose 70 - 99 mg/dL  664  403   BUN 6 - 23 mg/dL  13  13    13    Creatinine 0.61 - 1.24 mg/dL 4.74  2.59  CANCELED    0.68    0.66   Sodium 135 - 145 mEq/L  138  138   Potassium 3.5 - 5.1 mEq/L  4.5  4.9   Chloride 96 - 112 mEq/L  101  102   CO2 19 - 32 mEq/L  28  30   Calcium 8.4 - 10.5 mg/dL  9.7  9.8   Total Protein 6.0 - 8.5 g/dL  6.8      Renal function CrCl cannot be calculated (Unknown ideal weight.).  Hgb A1c MFr Bld (%)  Date Value  07/03/2021 6.7 (H)    LDL Cholesterol  Date Value Ref Range Status  07/03/2021 114 (H) 0 - 99 mg/dL Final   Direct LDL  Date Value Ref Range Status  10/17/2020 82.0 mg/dL Final    Comment:    Optimal:  <100 mg/dLNear or Above Optimal:  100-129 mg/dLBorderline High:  130-159 mg/dLHigh:  160-189 mg/dLVery High:  >190 mg/dL    CT angiogram 1. Critical left ICA origin stenosis due to atheromatous plaque. 2. 50% atheromatous narrowing of the proximal right ICA. 3. Advanced bilateral vertebral origin stenosis due to bulky calcified plaque.  Rande Brunt. Lenell Antu, MD FACS [facility] Phone Number: [phone] 06/11/2022 10:31 AM   Total time spent on preparing this encounter including chart review, data review, collecting history, examining the patient, coordinating care for this new patient, 60 minutes.  Portions of this report may have been transcribed using voice recognition software.  Every effort has been made to ensure accuracy; however, inadvertent computerized transcription errors may still be present.

## 2022-06-11 NOTE — H&P (View-Only) (Signed)
[facility] [place]  ASSESSMENT / PLAN: Kurt FisherMichael E Kidd is a 72 y.o. male with asymptomatic bilateral carotid artery stenosis recent CT angiogram shows critical left carotid artery stenosis.  He remains asymptomatic.  The patient should continue best medical therapy for carotid artery stenosis including: Complete cessation from all tobacco products. Blood glucose control with goal A1c < 7%. Blood pressure control with goal blood pressure < 140/90 mmHg. Lipid reduction therapy with goal LDL-C <100 mg/dL (<28<70 if symptomatic from carotid artery stenosis).  Aspirin 81mg  PO QD.  Atorvastatin 40-80mg  PO QD (or other "high intensity" statin therapy).  Plan left carotid endarterectomy 06/18/2022.  CHIEF COMPLAINT: asymptomatic carotid stenosis  HISTORY OF PRESENT ILLNESS: Kurt FisherMichael E Kidd is a 72 y.o. male referred to clinic to evaluate carotid artery stenosis. Dr. Rennis GoldenHilty has been following this for some time. He has no symptoms of stroke or mini-stroke. No facial droop, visual changes, no unilateral weakness. We spent the bulk of the visit reviewing the natural history of carotid artery stenosis. I explained the rationale for intervention in asymptomatic patients at 80%. We reviewed the plan for surveillance.   06/12/22: Returns to discuss CT angiogram results.  I counseled him about the worse than expected carotid stenosis in the left carotid bifurcation.  We reviewed his unique anatomy.  Ultimately I recommended a carotid endarterectomy given the low bifurcation in his neck.  Counseled him about his 1 to 2% risk of perioperative stroke, and risk of cranial nerve injury and hematoma.   Past Medical History:  Diagnosis Date   Blood transfusion without reported diagnosis    CAD (coronary artery disease)    DES.. 2004 /  catheterization 2008, 10% narrowing stent site, pain not cardiac   Colitis, ischemic (HCC)    Colon polyp    Degenerative joint disease    Diabetes mellitus  2012   Diverticulosis    Dyslipidemia    Ejection fraction    65-70%, echo, March, 2012   GERD (gastroesophageal reflux disease)    Hyperlipidemia    Hypertension    Mitral regurgitation    Echo, mild, 2012   Murmur    Onychomycosis    Overweight(278.02)    Palpitations    Rapid heartbeat and shortness of breath after eating 2008 recurrent episode 2012    Past Surgical History:  Procedure Laterality Date   ANGIOPLASTY / STENTING FEMORAL  09/09/2002   CARDIAC CATHETERIZATION     COLONOSCOPY  2014   Dr Lemar LivingsByrnett   COLONOSCOPY  11/26/2007   Dr Patterson/POLYPOID COLONIC MUCOSA WITH HYPEREMIA AND   COLONOSCOPY WITH PROPOFOL N/A 07/13/2016   Procedure: COLONOSCOPY WITH PROPOFOL;  Surgeon: Earline MayotteByrnett, Jeffrey W, MD;  Location: [facility];  Service: Endoscopy;  Laterality: N/A;   ESOPHAGOGASTRODUODENOSCOPY (EGD) WITH PROPOFOL N/A 07/13/2016   Procedure: ESOPHAGOGASTRODUODENOSCOPY (EGD) WITH PROPOFOL;  Surgeon: Earline MayotteByrnett, Jeffrey W, MD;  Location: [facility];  Service: Endoscopy;  Laterality: N/A;   TOTAL ANKLE ARTHROPLASTY Left 02/25/2020   Procedure: TOTAL ANKLE ARTHROPLASTY;  Surgeon: Toni ArthursHewitt, John, MD;  Location: [facility];  Service: Orthopedics;  Laterality: Left;   UPPER GI ENDOSCOPY  11/26/2007   Dr Jarold MottoPatterson    Family History  Problem Relation Age of Onset   Heart failure Mother    Heart disease Mother    Diabetes Father        father also had ASHD/ CABG   Heart disease Father    Colon cancer Brother [id number]   AAA (abdominal aortic aneurysm)  Brother [id number]   Esophageal cancer Neg Hx    Rectal cancer Neg Hx    Stomach cancer Neg Hx     Social History   Socioeconomic History   Marital status: Married    Spouse name: Not on file   Number of children: Not on file   Years of education: Not on file   Highest education level: Not on file  Occupational History   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    Employer: [employer]  Tobacco Use   Smoking status:  Former    Packs/day: 1.00    Years: 26.00    Additional pack years: 0.00    Total pack years: 26.00    Types: Cigarettes    Quit date: 03/05/1990    Years since quitting: 32.2   Smokeless tobacco: Never  Vaping Use   Vaping Use: Never used  Substance and Sexual Activity   Alcohol use: Yes    Alcohol/week: 0.0 standard drinks of alcohol    Comment: 2 drinks per day   Drug use: No   Sexual activity: Not on file  Other Topics Concern   Not on file  Social History Narrative   Not on file   Social Determinants of Health   Financial Resource Strain: Low Risk  (03/19/2022)   Overall Financial Resource Strain (CARDIA)    Difficulty of Paying Living Expenses: Not hard at all  Food Insecurity: No Food Insecurity (03/19/2022)   Hunger Vital Sign    Worried About Running Out of Food in the Last Year: Never true    Ran Out of Food in the Last Year: Never true  Transportation Needs: No Transportation Needs (03/19/2022)   PRAPARE - Administrator, Civil ServiceTransportation    Lack of Transportation (Medical): No    Lack of Transportation (Non-Medical): No  Physical Activity: Inactive (03/19/2022)   Exercise Vital Sign    Days of Exercise per Week: 0 days    Minutes of Exercise per Session: 0 min  Stress: No Stress Concern Present (03/19/2022)   [employer] - Occupational Stress Questionnaire    Feeling of Stress : Not at all  Social Connections: Moderately Isolated (03/15/2021)   Social Connection and Isolation Panel [NHANES]    Frequency of Communication with Friends and Family: Twice a week    Frequency of Social Gatherings with Friends and Family: Twice a week    Attends Religious Services: Never    Database administratorActive Member of Clubs or Organizations: No    Attends BankerClub or Organization Meetings: Never    Marital Status: Married  Catering managerntimate Partner Violence: Not At Risk (03/15/2021)   Humiliation, Afraid, Rape, and Kick questionnaire    Fear of Current or Ex-Partner: No    Emotionally Abused: No     Physically Abused: No    Sexually Abused: No    No Known Allergies  Current Outpatient Medications  Medication Sig Dispense Refill   atorvastatin (LIPITOR) 80 MG tablet Take 1 tablet by mouth once daily 90 tablet 0   Blood Glucose Monitoring Suppl (ONE TOUCH ULTRA SYSTEM KIT) W/DEVICE KIT 1 kit by Does not apply route once. 1 each 0   doxycycline (VIBRAMYCIN) 100 MG capsule Take 100 mg by mouth 2 (two) times daily. (Patient not taking: Reported on 03/19/2022)     esomeprazole (NEXIUM) 20 MG capsule Take 20 mg by mouth daily at 12 noon.     ezetimibe (ZETIA) 10 MG tablet Take 1 tablet by mouth once daily 90 tablet 2  irbesartan (AVAPRO) 75 MG tablet Take 1 tablet (75 mg total) by mouth daily. 30 tablet 1   metFORMIN (GLUCOPHAGE) 500 MG tablet TAKE 1 TABLET BY MOUTH THREE TIMES DAILY 270 tablet 0   metoprolol tartrate (LOPRESSOR) 50 MG tablet Take 1 tablet by mouth twice daily 180 tablet 2   Multiple Vitamins-Minerals (MULTIVITAMIN WITH MINERALS) tablet Take 1 tablet by mouth daily.     nitroGLYCERIN (NITROSTAT) 0.4 MG SL tablet Place 1 tablet (0.4 mg total) under the tongue every 5 (five) minutes as needed. 25 tablet 1   tamsulosin (FLOMAX) 0.4 MG CAPS capsule Take 1 capsule by mouth once daily 90 capsule 3   No current facility-administered medications for this visit.    PHYSICAL EXAM There were no vitals filed for this visit.  Well appearing man in no distress Regular rate and rhythm Unlabored breathing 2+ radial pulses Weakly palpable PT bilaterally Anterior L ankle scar  PERTINENT LABORATORY AND RADIOLOGIC DATA  Most recent CBC    Latest Ref Rng & Units 03/30/2021    9:18 AM 10/17/2020    9:53 AM 11/23/2019    9:30 AM  CBC  WBC 4.0 - 10.5 K/uL 7.0  7.1  6.7   Hemoglobin 13.0 - 17.0 g/dL 40.1  02.7  25.3   Hematocrit 39.0 - 52.0 % 42.6  39.5  40.9   Platelets 150.0 - 400.0 K/uL 299.0  268.0  303.0      Most recent CMP    Latest Ref Rng & Units 06/04/2022    1:22 PM  07/03/2021    9:30 AM 05/16/2021    8:23 AM  CMP  Glucose 70 - 99 mg/dL  664  403   BUN 6 - 23 mg/dL  13  13    13    Creatinine 0.61 - 1.24 mg/dL 4.74  2.59  CANCELED    0.68    0.66   Sodium 135 - 145 mEq/L  138  138   Potassium 3.5 - 5.1 mEq/L  4.5  4.9   Chloride 96 - 112 mEq/L  101  102   CO2 19 - 32 mEq/L  28  30   Calcium 8.4 - 10.5 mg/dL  9.7  9.8   Total Protein 6.0 - 8.5 g/dL  6.8      Renal function CrCl cannot be calculated (Unknown ideal weight.).  Hgb A1c MFr Bld (%)  Date Value  07/03/2021 6.7 (H)    LDL Cholesterol  Date Value Ref Range Status  07/03/2021 114 (H) 0 - 99 mg/dL Final   Direct LDL  Date Value Ref Range Status  10/17/2020 82.0 mg/dL Final    Comment:    Optimal:  <100 mg/dLNear or Above Optimal:  100-129 mg/dLBorderline High:  130-159 mg/dLHigh:  160-189 mg/dLVery High:  >190 mg/dL    CT angiogram 1. Critical left ICA origin stenosis due to atheromatous plaque. 2. 50% atheromatous narrowing of the proximal right ICA. 3. Advanced bilateral vertebral origin stenosis due to bulky calcified plaque.  Rande Brunt. Lenell Antu, MD FACS [facility] Phone Number: [phone] 06/11/2022 10:31 AM   Total time spent on preparing this encounter including chart review, data review, collecting history, examining the patient, coordinating care for this new patient, 60 minutes.  Portions of this report may have been transcribed using voice recognition software.  Every effort has been made to ensure accuracy; however, inadvertent computerized transcription errors may still be present.

## 2022-06-12 ENCOUNTER — Encounter: Payer: Self-pay | Admitting: Vascular Surgery

## 2022-06-12 ENCOUNTER — Ambulatory Visit (INDEPENDENT_AMBULATORY_CARE_PROVIDER_SITE_OTHER): Payer: PPO | Admitting: Vascular Surgery

## 2022-06-12 VITALS — BP 136/79 | HR 62 | Temp 98.2°F | Resp 20 | Ht 66.0 in | Wt 189.0 lb

## 2022-06-12 DIAGNOSIS — I6523 Occlusion and stenosis of bilateral carotid arteries: Secondary | ICD-10-CM

## 2022-06-13 ENCOUNTER — Telehealth: Payer: Self-pay

## 2022-06-13 ENCOUNTER — Other Ambulatory Visit: Payer: Self-pay

## 2022-06-13 DIAGNOSIS — I6523 Occlusion and stenosis of bilateral carotid arteries: Secondary | ICD-10-CM

## 2022-06-13 NOTE — Telephone Encounter (Signed)
Attempted to reach patient to schedule carotid endarterectomy surgery, no answer. No vm set up on cell number. Left message for patient to return call on home vm.

## 2022-06-14 NOTE — Telephone Encounter (Signed)
Spoke with patient/wife to schedule surgery for 4/15. Instructions provided and both verbalized understanding.

## 2022-06-15 ENCOUNTER — Other Ambulatory Visit: Payer: Self-pay

## 2022-06-15 ENCOUNTER — Encounter (HOSPITAL_COMMUNITY): Payer: Self-pay | Admitting: Vascular Surgery

## 2022-06-15 NOTE — Progress Notes (Signed)
PCP - PCP is Mliss Sax, MD Cardiologist - Zoila Shutter, MD. Last office visit 05/09/21  EKG - 05/09/2021.  Needs EKG on DOS Chest x-ray - 05/27/2017 ECHO - 03/03/2018 Stress Test - 07/30/2013 Cardiac Cath - 2017  CPAP - Denies  DM Type II Fasting Blood Sugar:  N/A Checks Blood Sugar:  once/day  Blood Thinner Instructions: Denies Aspirin Instructions: Per pt md   ERAS Protcol - No. NPO ordered COVID TEST- N/A   Anesthesia review: Yes, cardiac hx  -------------  SDW INSTRUCTIONS:  Your procedure is scheduled on Monday April 15th. Please report to [facility] Main Entrance "A" at 0530 A.M., and check in at the Admitting office. Call this number if you have problems the morning of surgery: [phone]   Remember: Do not eat and drink after midnight the night before your surgery    Medications to take morning of surgery with a sip of water include:  esomeprazole (NEXIUM)  metoprolol tartrate     As of today, STOP taking any Aspirin (unless otherwise instructed by your surgeon), Aleve, Naproxen, Ibuprofen, Motrin, Advil, Goody's, BC's, all herbal medications, fish oil, and all vitamins including your regular Multiple Vitamins-Minerals .  WHAT DO I DO ABOUT MY DIABETES MEDICATION?   Do not take oral diabetes METFORMIN the morning of surgery.   The day of surgery, do not take other diabetes injectables, including Byetta (exenatide), Bydureon (exenatide ER), Victoza (liraglutide), or Trulicity (dulaglutide).  If your CBG is greater than 220 mg/dL, you may take  of your sliding scale (correction) dose of insulin.   HOW TO MANAGE YOUR DIABETES BEFORE AND AFTER SURGERY  Why is it important to control my blood sugar before and after surgery? Improving blood sugar levels before and after surgery helps healing and can limit problems. A way of improving blood sugar control is eating a healthy diet by:  Eating less sugar and carbohydrates  Increasing  activity/exercise  Talking with your doctor about reaching your blood sugar goals High blood sugars (greater than 180 mg/dL) can raise your risk of infections and slow your recovery, so you will need to focus on controlling your diabetes during the weeks before surgery. Make sure that the doctor who takes care of your diabetes knows about your planned surgery including the date and location.  How do I manage my blood sugar before surgery? Check your blood sugar at least 4 times a day, starting 2 days before surgery, to make sure that the level is not too high or low.  Check your blood sugar the morning of your surgery when you wake up and every 2 hours until you get to the Short Stay unit.  If your blood sugar is less than 70 mg/dL, you will need to treat for low blood sugar: Do not take insulin. Treat a low blood sugar (less than 70 mg/dL) with  cup of clear juice (cranberry or apple), 4 glucose tablets, OR glucose gel. Recheck blood sugar in 15 minutes after treatment (to make sure it is greater than 70 mg/dL). If your blood sugar is not greater than 70 mg/dL on recheck, call [phone] for further instructions. Report your blood sugar to the short stay nurse when you get to Short Stay.  If you are admitted to the hospital after surgery: Your blood sugar will be checked by the staff and you will probably be given insulin after surgery (instead of oral diabetes medicines) to make sure you have good blood sugar levels. The goal for  blood sugar control after surgery is 80-180 mg/dL.    The Morning of Surgery Do not wear jewelry, make-up or nail polish. Do not wear lotions, powders, or perfumes/colognes, or deodorant Do not bring valuables to the hospital. [facility] is not responsible for any belongings or valuables.  If you are a smoker, DO NOT Smoke 24 hours prior to surgery  If you wear a CPAP at night please bring your mask the morning of surgery   Remember that you must have  someone to transport you home after your surgery, and remain with you for 24 hours if you are discharged the same day.  Please bring cases for contacts, glasses, hearing aids, dentures or bridgework because it cannot be worn into surgery.   Patients discharged the day of surgery will not be allowed to drive home.   Please shower the NIGHT BEFORE/MORNING OF SURGERY (use antibacterial soap like DIAL soap if possible). Wear comfortable clothes the morning of surgery. Oral Hygiene is also important to reduce your risk of infection.  Remember - BRUSH YOUR TEETH THE MORNING OF SURGERY WITH YOUR REGULAR TOOTHPASTE  Patient denies shortness of breath, fever, cough and chest pain.

## 2022-06-15 NOTE — Anesthesia Preprocedure Evaluation (Signed)
Anesthesia Evaluation  Patient identified by MRN, date of birth, ID band Patient awake    Reviewed: Allergy & Precautions, NPO status , Patient's Chart, lab work & pertinent test results, reviewed documented beta blocker date and time   History of Anesthesia Complications Negative for: history of anesthetic complications  Airway Mallampati: II  TM Distance: >3 FB Neck ROM: Full    Dental no notable dental hx. (+) Dental Advisory Given   Pulmonary former smoker   Pulmonary exam normal        Cardiovascular hypertension, Pt. on medications and Pt. on home beta blockers + CAD and + Cardiac Stents (2004)  Normal cardiovascular exam+ Valvular Problems/Murmurs MR   TTE 2019: moderate LVH, mild focalbasal hypertrophy of the septum, EF 50-55%, possible hypokinesis of the apical myocardium, grade 1 DD, mild MR, severe LAE, PASP mildly increased 33 mmHg   Neuro/Psych negative neurological ROS  negative psych ROS   GI/Hepatic Neg liver ROS,GERD  Medicated and Controlled,,  Endo/Other  diabetes, Type 2, Oral Hypoglycemic Agents    Renal/GU negative Renal ROS  negative genitourinary   Musculoskeletal negative musculoskeletal ROS (+)    Abdominal   Peds  Hematology negative hematology ROS (+)   Anesthesia Other Findings Day of surgery medications reviewed with patient.  Reproductive/Obstetrics negative OB ROS                             Anesthesia Physical Anesthesia Plan  ASA: 3  Anesthesia Plan: General   Post-op Pain Management: GA combined w/ Regional for post-op pain and Tylenol PO (pre-op)*   Induction: Intravenous  PONV Risk Score and Plan: 3 and Treatment may vary due to age or medical condition, Ondansetron, Dexamethasone and Diphenhydramine  Airway Management Planned: Oral ETT  Additional Equipment: Arterial line  Intra-op Plan:   Post-operative Plan: Extubation in OR  Informed  Consent: I have reviewed the patients History and Physical, chart, labs and discussed the procedure including the risks, benefits and alternatives for the proposed anesthesia with the patient or authorized representative who has indicated his/her understanding and acceptance.     Dental advisory given  Plan Discussed with: Anesthesiologist and CRNA  Anesthesia Plan Comments: (See APP note by Joslyn Hy, FNP )        Anesthesia Quick Evaluation

## 2022-06-15 NOTE — Progress Notes (Signed)
Anesthesia Chart Review:  Pt is a same day work up    Case: [id number] Date/Time: 06/18/22 0715   Procedure: ENDARTERECTOMY CAROTID (Left)   Anesthesia type: General   Pre-op diagnosis: Bilateral carotid artery stenosis   [facility] ROOM [facility] / [facility]   Surgeons: Leonie Douglas, MD       DISCUSSION: Pt is 72 years old with hx CAD (s/p DES to LAD 2004, HTN, carotid artery stenosis, mitral regurgitation, DM   PROVIDERS: - PCP is Mliss Sax, MD - Cardiologist is Zoila Shutter, MD. Last office visit 05/09/21  LABS: Will be obtained day of surgery    IMAGES: CT angio head neck 06/04/22:  1. Critical left ICA origin stenosis due to atheromatous plaque. 2. 50% atheromatous narrowing of the proximal right ICA. 3. Advanced bilateral vertebral origin stenosis due to bulky calcified plaque.   EKG: Will be obtained day of surgery    CV: Carotid US 04/30/22:  - Right Carotid: Velocities in the right ICA are consistent with a 40-59% stenosis. Non-hemodynamically significant plaque <50% noted in the CCA.  - Left Carotid: Velocities in the left ICA are consistent with a 60-79% stenosis. Non-hemodynamically significant plaque <50% noted in the CCA.  - Vertebrals: Bilateral vertebral arteries demonstrate antegrade flow.  - Subclavians: Normal flow hemodynamics were seen in bilateral subclavian arteries.   Echo 03/03/18:  - Left ventricle: The cavity size was normal. Wall thickness was increased in a pattern of moderate LVH. There was mild focal basal hypertrophy of the septum. Systolic function was normal. The estimated ejection fraction was in the range of 50% to 55%. Possible hypokinesis of the apical myocardium. Doppler parameters are consistent with abnormal left ventricular relaxation (grade 1 diastolic dysfunction). Doppler parameters are consistent with high ventricular filling pressure.  - Mitral valve: Calcified annulus. Mildly thickened leaflets. There was mild  regurgitation.  - Left atrium: The atrium was severely dilated.  - Pulmonary arteries: Systolic pressure was mildly increased. PA peak pressure: 33 mm Hg (S).     Past Medical History:  Diagnosis Date   Blood transfusion without reported diagnosis    CAD (coronary artery disease)    DES.. 2004 /  catheterization 2008, 10% narrowing stent site, pain not cardiac   Colitis, ischemic    Colon polyp    Degenerative joint disease    Diabetes mellitus 2012   Diverticulosis    Dyslipidemia    Ejection fraction    65-70%, echo, March, 2012   GERD (gastroesophageal reflux disease)    Hyperlipidemia    Hypertension    Mitral regurgitation    Echo, mild, 2012   Murmur    Onychomycosis    Overweight(278.02)    Palpitations    Rapid heartbeat and shortness of breath after eating 2008 recurrent episode 2012    Past Surgical History:  Procedure Laterality Date   ANGIOPLASTY / STENTING FEMORAL  09/09/2002   CARDIAC CATHETERIZATION     COLONOSCOPY  2014   Dr Lemar Livings   COLONOSCOPY  11/26/2007   Dr Patterson/POLYPOID COLONIC MUCOSA WITH HYPEREMIA AND   COLONOSCOPY WITH PROPOFOL N/A 07/13/2016   Procedure: COLONOSCOPY WITH PROPOFOL;  Surgeon: Earline Mayotte, MD;  Location: [facility];  Service: Endoscopy;  Laterality: N/A;   ESOPHAGOGASTRODUODENOSCOPY (EGD) WITH PROPOFOL N/A 07/13/2016   Procedure: ESOPHAGOGASTRODUODENOSCOPY (EGD) WITH PROPOFOL;  Surgeon: Earline Mayotte, MD;  Location: [facility];  Service: Endoscopy;  Laterality: N/A;   TOTAL ANKLE ARTHROPLASTY Left 02/25/2020   Procedure:  TOTAL ANKLE ARTHROPLASTY;  Surgeon: Toni Arthurs, MD;  Location: [facility];  Service: Orthopedics;  Laterality: Left;   UPPER GI ENDOSCOPY  11/26/2007   Dr Jarold Motto    MEDICATIONS: No current facility-administered medications for this encounter.    atorvastatin (LIPITOR) 80 MG tablet   esomeprazole (NEXIUM) 20 MG capsule   ezetimibe (ZETIA) 10 MG tablet   LYSINE PO    metFORMIN (GLUCOPHAGE) 500 MG tablet   metoprolol tartrate (LOPRESSOR) 50 MG tablet   Multiple Vitamins-Minerals (MULTIVITAMIN WITH MINERALS) tablet   olive oil (SM SWEET OIL) external oil   tamsulosin (FLOMAX) 0.4 MG CAPS capsule   Blood Glucose Monitoring Suppl (ONE TOUCH ULTRA SYSTEM KIT) W/DEVICE KIT    If labs and EKG acceptable day of surgery, I anticipate pt can proceed with surgery as scheduled.  [name], PhD, FNP-BC [facility] Short Stay Surgical Center/Anesthesiology Phone: [phone] 06/15/2022 11:09 AM

## 2022-06-18 ENCOUNTER — Other Ambulatory Visit: Payer: Self-pay

## 2022-06-18 ENCOUNTER — Inpatient Hospital Stay (HOSPITAL_COMMUNITY)
Admission: RE | Admit: 2022-06-18 | Discharge: 2022-06-19 | DRG: 038 | Disposition: A | Discharge status: Home / Self Care | Payer: PPO | Attending: Vascular Surgery | Admitting: Vascular Surgery

## 2022-06-18 ENCOUNTER — Inpatient Hospital Stay (HOSPITAL_COMMUNITY): Payer: PPO | Admitting: Emergency Medicine

## 2022-06-18 ENCOUNTER — Encounter (HOSPITAL_COMMUNITY)
Admission: RE | Disposition: A | Discharge status: Home / Self Care | Payer: Self-pay | Source: Home / Self Care | Attending: Vascular Surgery

## 2022-06-18 ENCOUNTER — Encounter (HOSPITAL_COMMUNITY): Payer: Self-pay | Admitting: Vascular Surgery

## 2022-06-18 DIAGNOSIS — Z79899 Other long term (current) drug therapy: Secondary | ICD-10-CM | POA: Diagnosis not present

## 2022-06-18 DIAGNOSIS — Z833 Family history of diabetes mellitus: Secondary | ICD-10-CM | POA: Diagnosis not present

## 2022-06-18 DIAGNOSIS — I2510 Atherosclerotic heart disease of native coronary artery without angina pectoris: Secondary | ICD-10-CM | POA: Diagnosis not present

## 2022-06-18 DIAGNOSIS — Z95820 Peripheral vascular angioplasty status with implants and grafts: Secondary | ICD-10-CM

## 2022-06-18 DIAGNOSIS — Z955 Presence of coronary angioplasty implant and graft: Secondary | ICD-10-CM | POA: Diagnosis not present

## 2022-06-18 DIAGNOSIS — D62 Acute posthemorrhagic anemia: Secondary | ICD-10-CM | POA: Diagnosis not present

## 2022-06-18 DIAGNOSIS — I340 Nonrheumatic mitral (valve) insufficiency: Secondary | ICD-10-CM | POA: Diagnosis not present

## 2022-06-18 DIAGNOSIS — Z981 Arthrodesis status: Secondary | ICD-10-CM | POA: Diagnosis not present

## 2022-06-18 DIAGNOSIS — I6522 Occlusion and stenosis of left carotid artery: Secondary | ICD-10-CM | POA: Diagnosis not present

## 2022-06-18 DIAGNOSIS — Z86010 Personal history of colonic polyps: Secondary | ICD-10-CM | POA: Diagnosis not present

## 2022-06-18 DIAGNOSIS — Z7984 Long term (current) use of oral hypoglycemic drugs: Secondary | ICD-10-CM

## 2022-06-18 DIAGNOSIS — Z87891 Personal history of nicotine dependence: Secondary | ICD-10-CM

## 2022-06-18 DIAGNOSIS — Z800 Family history of malignant neoplasm of digestive organs: Secondary | ICD-10-CM | POA: Diagnosis not present

## 2022-06-18 DIAGNOSIS — I6529 Occlusion and stenosis of unspecified carotid artery: Secondary | ICD-10-CM | POA: Diagnosis present

## 2022-06-18 DIAGNOSIS — K219 Gastro-esophageal reflux disease without esophagitis: Secondary | ICD-10-CM | POA: Diagnosis present

## 2022-06-18 DIAGNOSIS — I6523 Occlusion and stenosis of bilateral carotid arteries: Secondary | ICD-10-CM | POA: Diagnosis not present

## 2022-06-18 DIAGNOSIS — I10 Essential (primary) hypertension: Secondary | ICD-10-CM | POA: Diagnosis present

## 2022-06-18 DIAGNOSIS — E785 Hyperlipidemia, unspecified: Secondary | ICD-10-CM | POA: Diagnosis present

## 2022-06-18 DIAGNOSIS — Z8249 Family history of ischemic heart disease and other diseases of the circulatory system: Secondary | ICD-10-CM | POA: Diagnosis not present

## 2022-06-18 DIAGNOSIS — E119 Type 2 diabetes mellitus without complications: Secondary | ICD-10-CM | POA: Diagnosis present

## 2022-06-18 HISTORY — PX: ENDARTERECTOMY: SHX5162

## 2022-06-18 LAB — CBC
HCT: 40.2 % (ref 39.0–52.0)
Hemoglobin: 14 g/dL (ref 13.0–17.0)
MCH: 29.9 pg (ref 26.0–34.0)
MCHC: 34.8 g/dL (ref 30.0–36.0)
MCV: 85.7 fL (ref 80.0–100.0)
Platelets: 254 10*3/uL (ref 150–400)
RBC: 4.69 MIL/uL (ref 4.22–5.81)
RDW: 13.9 % (ref 11.5–15.5)
WBC: 7.9 10*3/uL (ref 4.0–10.5)
nRBC: 0 % (ref 0.0–0.2)

## 2022-06-18 LAB — SURGICAL PCR SCREEN
MRSA, PCR: NEGATIVE
Staphylococcus aureus: NEGATIVE

## 2022-06-18 LAB — HEMOGLOBIN A1C
Hgb A1c MFr Bld: 6.8 % — ABNORMAL HIGH (ref 4.8–5.6)
Mean Plasma Glucose: 148.46 mg/dL

## 2022-06-18 LAB — COMPREHENSIVE METABOLIC PANEL
ALT: 36 U/L (ref 0–44)
AST: 30 U/L (ref 15–41)
Albumin: 4 g/dL (ref 3.5–5.0)
Alkaline Phosphatase: 74 U/L (ref 38–126)
Anion gap: 10 (ref 5–15)
BUN: 16 mg/dL (ref 8–23)
CO2: 27 mmol/L (ref 22–32)
Calcium: 9.5 mg/dL (ref 8.9–10.3)
Chloride: 103 mmol/L (ref 98–111)
Creatinine, Ser: 0.78 mg/dL (ref 0.61–1.24)
GFR, Estimated: >60 mL/min (ref >60)
Glucose, Bld: 156 mg/dL — ABNORMAL HIGH (ref 70–99)
Potassium: 4.5 mmol/L (ref 3.5–5.1)
Sodium: 140 mmol/L (ref 135–145)
Total Bilirubin: 0.8 mg/dL (ref 0.3–1.2)
Total Protein: 6.8 g/dL (ref 6.5–8.1)

## 2022-06-18 LAB — PROTIME-INR
INR: 1 (ref 0.8–1.2)
Prothrombin Time: 13.3 seconds (ref 11.4–15.2)

## 2022-06-18 LAB — GLUCOSE, CAPILLARY
Glucose-Capillary: 156 mg/dL — ABNORMAL HIGH (ref 70–99)
Glucose-Capillary: 156 mg/dL — ABNORMAL HIGH (ref 70–99)
Glucose-Capillary: 187 mg/dL — ABNORMAL HIGH (ref 70–99)
Glucose-Capillary: 320 mg/dL — ABNORMAL HIGH (ref 70–99)
Glucose-Capillary: 134 mg/dL — ABNORMAL HIGH (ref 70–99)

## 2022-06-18 LAB — POCT ACTIVATED CLOTTING TIME
Activated Clotting Time: 250 seconds
Activated Clotting Time: 228 seconds
Activated Clotting Time: 250 seconds

## 2022-06-18 LAB — TYPE AND SCREEN
ABO/RH(D): A POS
Antibody Screen: NEGATIVE

## 2022-06-18 LAB — APTT: aPTT: 29 seconds (ref 24–36)

## 2022-06-18 LAB — ABO/RH: ABO/RH(D): A POS

## 2022-06-18 SURGERY — ENDARTERECTOMY, CAROTID
Anesthesia: General | Site: Neck | Laterality: Left

## 2022-06-18 MED ORDER — SODIUM CHLORIDE 0.9 % IV SOLN: INTRAVENOUS | Status: DC

## 2022-06-18 MED ORDER — FENTANYL CITRATE (PF) 250 MCG/5ML IJ SOLN
INTRAMUSCULAR | Status: DC | PRN
  Administered 2022-06-18: 100 ug via INTRAVENOUS

## 2022-06-18 MED ORDER — PROMETHAZINE HCL 25 MG/ML IJ SOLN: 6.2500 mg | INTRAMUSCULAR | Status: DC | PRN

## 2022-06-18 MED ORDER — LIDOCAINE HCL (PF) 1 % IJ SOLN
INTRAMUSCULAR | Status: AC
  Filled 2022-06-18: qty 30

## 2022-06-18 MED ORDER — DOCUSATE SODIUM 100 MG PO CAPS
100.0000 mg | ORAL_CAPSULE | Freq: Every day | ORAL | Status: DC
  Administered 2022-06-19: 100 mg via ORAL
  Filled 2022-06-18: qty 1

## 2022-06-18 MED ORDER — METOPROLOL TARTRATE 50 MG PO TABS
50.0000 mg | ORAL_TABLET | Freq: Two times a day (BID) | ORAL | Status: DC
  Administered 2022-06-18 – 2022-06-19 (×2): 50 mg via ORAL
  Filled 2022-06-18 (×2): qty 1

## 2022-06-18 MED ORDER — BISACODYL 5 MG PO TBEC: 5.0000 mg | DELAYED_RELEASE_TABLET | Freq: Every day | ORAL | Status: DC | PRN

## 2022-06-18 MED ORDER — PANTOPRAZOLE SODIUM 40 MG PO TBEC: 40.0000 mg | DELAYED_RELEASE_TABLET | Freq: Every day | ORAL | Status: DC

## 2022-06-18 MED ORDER — CHLORHEXIDINE GLUCONATE CLOTH 2 % EX PADS: 6.0000 | MEDICATED_PAD | Freq: Once | CUTANEOUS | Status: DC

## 2022-06-18 MED ORDER — ONDANSETRON HCL 4 MG/2ML IJ SOLN: 4.0000 mg | Freq: Four times a day (QID) | INTRAMUSCULAR | Status: DC | PRN

## 2022-06-18 MED ORDER — PROTAMINE SULFATE 10 MG/ML IV SOLN
INTRAVENOUS | Status: DC | PRN
  Administered 2022-06-18: 20 mg via INTRAVENOUS
  Administered 2022-06-18: 10 mg via INTRAVENOUS
  Administered 2022-06-18: 20 mg via INTRAVENOUS

## 2022-06-18 MED ORDER — FENTANYL CITRATE (PF) 250 MCG/5ML IJ SOLN
INTRAMUSCULAR | Status: AC
  Filled 2022-06-18: qty 5

## 2022-06-18 MED ORDER — MAGNESIUM SULFATE 2 GM/50ML IV SOLN: 2.0000 g | Freq: Every day | INTRAVENOUS | Status: DC | PRN

## 2022-06-18 MED ORDER — CEFAZOLIN SODIUM-DEXTROSE 2-4 GM/100ML-% IV SOLN
INTRAVENOUS | Status: AC
  Filled 2022-06-18: qty 100

## 2022-06-18 MED ORDER — BENZOCAINE 10 % MT GEL
Freq: Two times a day (BID) | OROMUCOSAL | Status: DC | PRN
  Administered 2022-06-18: 1 via OROMUCOSAL
  Filled 2022-06-18: qty 9

## 2022-06-18 MED ORDER — CHLORHEXIDINE GLUCONATE 0.12 % MT SOLN
15.0000 mL | Freq: Once | OROMUCOSAL | Status: AC
  Administered 2022-06-18: 15 mL via OROMUCOSAL

## 2022-06-18 MED ORDER — SODIUM CHLORIDE 0.9 % IV SOLN
0.1500 ug/kg/min | INTRAVENOUS | Status: AC
  Administered 2022-06-18: .2 ug/kg/min via INTRAVENOUS
  Filled 2022-06-18: qty 2000

## 2022-06-18 MED ORDER — GUAIFENESIN-DM 100-10 MG/5ML PO SYRP: 15.0000 mL | ORAL_SOLUTION | ORAL | Status: DC | PRN

## 2022-06-18 MED ORDER — LIDOCAINE 2% (20 MG/ML) 5 ML SYRINGE
INTRAMUSCULAR | Status: AC
  Filled 2022-06-18: qty 5

## 2022-06-18 MED ORDER — PROPOFOL 10 MG/ML IV BOLUS
INTRAVENOUS | Status: DC | PRN
  Administered 2022-06-18: 120 mg via INTRAVENOUS

## 2022-06-18 MED ORDER — LACTATED RINGERS IV SOLN: INTRAVENOUS | Status: DC | PRN

## 2022-06-18 MED ORDER — CEFAZOLIN SODIUM-DEXTROSE 2-4 GM/100ML-% IV SOLN
2.0000 g | Freq: Three times a day (TID) | INTRAVENOUS | Status: AC
  Administered 2022-06-18 – 2022-06-19 (×2): 2 g via INTRAVENOUS
  Filled 2022-06-18 (×2): qty 100

## 2022-06-18 MED ORDER — LABETALOL HCL 5 MG/ML IV SOLN: 10.0000 mg | INTRAVENOUS | Status: DC | PRN

## 2022-06-18 MED ORDER — PROPOFOL 10 MG/ML IV BOLUS
INTRAVENOUS | Status: AC
  Filled 2022-06-18: qty 20

## 2022-06-18 MED ORDER — HEPARIN SODIUM (PORCINE) 1000 UNIT/ML IJ SOLN
INTRAMUSCULAR | Status: DC | PRN
  Administered 2022-06-18: 8000 [IU] via INTRAVENOUS
  Administered 2022-06-18: 2000 [IU] via INTRAVENOUS

## 2022-06-18 MED ORDER — PHENOL 1.4 % MT LIQD
1.0000 | OROMUCOSAL | Status: DC | PRN
  Administered 2022-06-19: 1 via OROMUCOSAL
  Filled 2022-06-18: qty 177

## 2022-06-18 MED ORDER — ACETAMINOPHEN 325 MG PO TABS
325.0000 mg | ORAL_TABLET | ORAL | Status: DC | PRN
  Administered 2022-06-18: 650 mg via ORAL
  Filled 2022-06-18: qty 2

## 2022-06-18 MED ORDER — DEXAMETHASONE SODIUM PHOSPHATE 10 MG/ML IJ SOLN
INTRAMUSCULAR | Status: AC
  Filled 2022-06-18: qty 1

## 2022-06-18 MED ORDER — PHENYLEPHRINE 80 MCG/ML (10ML) SYRINGE FOR IV PUSH (FOR BLOOD PRESSURE SUPPORT)
PREFILLED_SYRINGE | INTRAVENOUS | Status: AC
  Filled 2022-06-18: qty 10

## 2022-06-18 MED ORDER — PANTOPRAZOLE SODIUM 40 MG PO TBEC
40.0000 mg | DELAYED_RELEASE_TABLET | Freq: Every day | ORAL | Status: DC
  Administered 2022-06-19: 40 mg via ORAL
  Filled 2022-06-18: qty 1

## 2022-06-18 MED ORDER — ROCURONIUM BROMIDE 10 MG/ML (PF) SYRINGE
PREFILLED_SYRINGE | INTRAVENOUS | Status: AC
  Filled 2022-06-18: qty 10

## 2022-06-18 MED ORDER — HYDROMORPHONE HCL 1 MG/ML IJ SOLN: 0.5000 mg | INTRAMUSCULAR | Status: DC | PRN

## 2022-06-18 MED ORDER — LACTATED RINGERS IV SOLN: INTRAVENOUS | Status: DC

## 2022-06-18 MED ORDER — METFORMIN HCL 500 MG PO TABS
500.0000 mg | ORAL_TABLET | Freq: Two times a day (BID) | ORAL | Status: DC
  Administered 2022-06-18 – 2022-06-19 (×2): 500 mg via ORAL
  Filled 2022-06-18 (×2): qty 1

## 2022-06-18 MED ORDER — CLEVIDIPINE BUTYRATE 0.5 MG/ML IV EMUL
INTRAVENOUS | Status: DC | PRN
  Administered 2022-06-18: 4 mg/h via INTRAVENOUS

## 2022-06-18 MED ORDER — ASPIRIN 325 MG PO TBEC
325.0000 mg | DELAYED_RELEASE_TABLET | Freq: Every day | ORAL | Status: DC
  Administered 2022-06-19: 325 mg via ORAL
  Filled 2022-06-18: qty 1

## 2022-06-18 MED ORDER — INSULIN ASPART 100 UNIT/ML IJ SOLN: 0.0000 [IU] | INTRAMUSCULAR | Status: DC | PRN

## 2022-06-18 MED ORDER — INSULIN ASPART 100 UNIT/ML IJ SOLN
0.0000 [IU] | Freq: Three times a day (TID) | INTRAMUSCULAR | Status: DC
  Administered 2022-06-18: 7 [IU] via SUBCUTANEOUS
  Administered 2022-06-19: 1 [IU] via SUBCUTANEOUS

## 2022-06-18 MED ORDER — PHENYLEPHRINE HCL-NACL 20-0.9 MG/250ML-% IV SOLN
INTRAVENOUS | Status: DC | PRN
  Administered 2022-06-18: 50 ug/min via INTRAVENOUS

## 2022-06-18 MED ORDER — ACETAMINOPHEN 500 MG PO TABS
1000.0000 mg | ORAL_TABLET | Freq: Once | ORAL | Status: AC
  Administered 2022-06-18: 1000 mg via ORAL
  Filled 2022-06-18: qty 2

## 2022-06-18 MED ORDER — ATORVASTATIN CALCIUM 80 MG PO TABS
80.0000 mg | ORAL_TABLET | Freq: Every day | ORAL | Status: DC
  Administered 2022-06-18: 80 mg via ORAL
  Filled 2022-06-18: qty 1

## 2022-06-18 MED ORDER — AMISULPRIDE (ANTIEMETIC) 5 MG/2ML IV SOLN: 10.0000 mg | Freq: Once | INTRAVENOUS | Status: DC | PRN

## 2022-06-18 MED ORDER — ACETAMINOPHEN 650 MG RE SUPP: 325.0000 mg | RECTAL | Status: DC | PRN

## 2022-06-18 MED ORDER — ALUM & MAG HYDROXIDE-SIMETH 200-200-20 MG/5ML PO SUSP: 15.0000 mL | ORAL | Status: DC | PRN

## 2022-06-18 MED ORDER — LYSINE 1000 MG PO TABS: 1000.0000 mg | ORAL_TABLET | Freq: Two times a day (BID) | ORAL | Status: DC | PRN

## 2022-06-18 MED ORDER — ORAL CARE MOUTH RINSE: 15.0000 mL | Freq: Once | OROMUCOSAL | Status: AC

## 2022-06-18 MED ORDER — FENTANYL CITRATE (PF) 100 MCG/2ML IJ SOLN: 25.0000 ug | INTRAMUSCULAR | Status: DC | PRN

## 2022-06-18 MED ORDER — POTASSIUM CHLORIDE CRYS ER 20 MEQ PO TBCR: 20.0000 meq | EXTENDED_RELEASE_TABLET | Freq: Every day | ORAL | Status: DC | PRN

## 2022-06-18 MED ORDER — LIDOCAINE-EPINEPHRINE (PF) 1 %-1:200000 IJ SOLN
INTRAMUSCULAR | Status: AC
  Filled 2022-06-18: qty 30

## 2022-06-18 MED ORDER — HYDRALAZINE HCL 20 MG/ML IJ SOLN: 5.0000 mg | INTRAMUSCULAR | Status: DC | PRN

## 2022-06-18 MED ORDER — EZETIMIBE 10 MG PO TABS
10.0000 mg | ORAL_TABLET | Freq: Every day | ORAL | Status: DC
  Administered 2022-06-18: 10 mg via ORAL
  Filled 2022-06-18: qty 1

## 2022-06-18 MED ORDER — OXYCODONE-ACETAMINOPHEN 5-325 MG PO TABS
1.0000 | ORAL_TABLET | ORAL | Status: DC | PRN
  Administered 2022-06-18 – 2022-06-19 (×3): 2 via ORAL
  Filled 2022-06-18 (×3): qty 2

## 2022-06-18 MED ORDER — HEPARIN SODIUM (PORCINE) 1000 UNIT/ML IJ SOLN
INTRAMUSCULAR | Status: AC
  Filled 2022-06-18: qty 10

## 2022-06-18 MED ORDER — SUGAMMADEX SODIUM 200 MG/2ML IV SOLN
INTRAVENOUS | Status: DC | PRN
  Administered 2022-06-18: 200 mg via INTRAVENOUS

## 2022-06-18 MED ORDER — CEFAZOLIN SODIUM-DEXTROSE 2-4 GM/100ML-% IV SOLN
2.0000 g | INTRAVENOUS | Status: AC
  Administered 2022-06-18: 2 g via INTRAVENOUS

## 2022-06-18 MED ORDER — HEPARIN 6000 UNIT IRRIGATION SOLUTION
Status: AC
  Filled 2022-06-18: qty 500

## 2022-06-18 MED ORDER — ROCURONIUM BROMIDE 10 MG/ML (PF) SYRINGE
PREFILLED_SYRINGE | INTRAVENOUS | Status: DC | PRN
  Administered 2022-06-18: 60 mg via INTRAVENOUS

## 2022-06-18 MED ORDER — ONDANSETRON HCL 4 MG/2ML IJ SOLN
INTRAMUSCULAR | Status: AC
  Filled 2022-06-18: qty 2

## 2022-06-18 MED ORDER — SENNOSIDES-DOCUSATE SODIUM 8.6-50 MG PO TABS: 1.0000 | ORAL_TABLET | Freq: Every evening | ORAL | Status: DC | PRN

## 2022-06-18 MED ORDER — TAMSULOSIN HCL 0.4 MG PO CAPS
0.4000 mg | ORAL_CAPSULE | Freq: Every day | ORAL | Status: DC
  Administered 2022-06-18: 0.4 mg via ORAL
  Filled 2022-06-18: qty 1

## 2022-06-18 MED ORDER — LIDOCAINE 2% (20 MG/ML) 5 ML SYRINGE
INTRAMUSCULAR | Status: DC | PRN
  Administered 2022-06-18: 100 mg via INTRAVENOUS

## 2022-06-18 MED ORDER — SODIUM CHLORIDE 0.9 % IV SOLN: 500.0000 mL | Freq: Once | INTRAVENOUS | Status: DC | PRN

## 2022-06-18 MED ORDER — ONDANSETRON HCL 4 MG/2ML IJ SOLN
INTRAMUSCULAR | Status: DC | PRN
  Administered 2022-06-18: 4 mg via INTRAVENOUS

## 2022-06-18 MED ORDER — HEMOSTATIC AGENTS (NO CHARGE) OPTIME
TOPICAL | Status: DC | PRN
  Administered 2022-06-18: 1 via TOPICAL

## 2022-06-18 MED ORDER — PHENYLEPHRINE 80 MCG/ML (10ML) SYRINGE FOR IV PUSH (FOR BLOOD PRESSURE SUPPORT)
PREFILLED_SYRINGE | INTRAVENOUS | Status: DC | PRN
  Administered 2022-06-18 (×5): 40 ug via INTRAVENOUS
  Administered 2022-06-18: 80 ug via INTRAVENOUS
  Administered 2022-06-18 (×2): 40 ug via INTRAVENOUS

## 2022-06-18 MED ORDER — PROTAMINE SULFATE 10 MG/ML IV SOLN
INTRAVENOUS | Status: AC
  Filled 2022-06-18: qty 5

## 2022-06-18 MED ORDER — HEPARIN 6000 UNIT IRRIGATION SOLUTION
Status: DC | PRN
  Administered 2022-06-18: 1

## 2022-06-18 MED ORDER — METOPROLOL TARTRATE 5 MG/5ML IV SOLN: 2.0000 mg | INTRAVENOUS | Status: DC | PRN

## 2022-06-18 MED ORDER — HEPARIN SODIUM (PORCINE) 5000 UNIT/ML IJ SOLN
5000.0000 [IU] | Freq: Three times a day (TID) | INTRAMUSCULAR | Status: DC
  Administered 2022-06-19: 5000 [IU] via SUBCUTANEOUS
  Filled 2022-06-18: qty 1

## 2022-06-18 MED ORDER — 0.9 % SODIUM CHLORIDE (POUR BTL) OPTIME
TOPICAL | Status: DC | PRN
  Administered 2022-06-18: 1000 mL

## 2022-06-18 MED ORDER — DEXAMETHASONE SODIUM PHOSPHATE 10 MG/ML IJ SOLN
INTRAMUSCULAR | Status: DC | PRN
  Administered 2022-06-18: 10 mg via INTRAVENOUS

## 2022-06-18 MED ORDER — ZOLPIDEM TARTRATE 5 MG PO TABS
5.0000 mg | ORAL_TABLET | Freq: Every evening | ORAL | Status: DC | PRN
  Administered 2022-06-19: 5 mg via ORAL
  Filled 2022-06-18: qty 1

## 2022-06-18 SURGICAL SUPPLY — 53 items
APL PRP STRL LF DISP 70% ISPRP (MISCELLANEOUS) ×1
APL SKNCLS STERI-STRIP NONHPOA (GAUZE/BANDAGES/DRESSINGS) ×1
BENZOIN TINCTURE PRP APPL 2/3 (GAUZE/BANDAGES/DRESSINGS) ×1 IMPLANT
BLADE MINI RND TIP GREEN BEAV (BLADE) ×1 IMPLANT
BLADE NDL 3 SS STRL (BLADE) IMPLANT
BLADE NEEDLE 3 SS STRL (BLADE) ×1 IMPLANT
CANISTER SUCT 3000ML PPV (MISCELLANEOUS) ×1 IMPLANT
CANNULA VESSEL 3MM 2 BLNT TIP (CANNULA) ×2 IMPLANT
CATH ROBINSON RED A/P 18FR (CATHETERS) ×1 IMPLANT
CHLORAPREP W/TINT 26 (MISCELLANEOUS) ×1 IMPLANT
CLIP LIGATING EXTRA MED SLVR (CLIP) ×1 IMPLANT
CLIP LIGATING EXTRA SM BLUE (MISCELLANEOUS) ×1 IMPLANT
COVER PROBE W GEL 5X96 (DRAPES) ×1 IMPLANT
DRAIN CHANNEL 15F RND FF W/TCR (WOUND CARE) IMPLANT
DRAPE WARM FLUID 44X44 (DRAPES) IMPLANT
DRSG COVADERM 4X8 (GAUZE/BANDAGES/DRESSINGS) ×1 IMPLANT
DRSG OPSITE POSTOP 4X8 (GAUZE/BANDAGES/DRESSINGS) IMPLANT
ELECT REM PT RETURN 9FT ADLT (ELECTROSURGICAL) ×1
ELECTRODE REM PT RTRN 9FT ADLT (ELECTROSURGICAL) ×1 IMPLANT
EVACUATOR SILICONE 100CC (DRAIN) IMPLANT
GLOVE BIO SURGEON STRL SZ8 (GLOVE) ×1 IMPLANT
GOWN STRL REUS W/ TWL LRG LVL3 (GOWN DISPOSABLE) ×2 IMPLANT
GOWN STRL REUS W/ TWL XL LVL3 (GOWN DISPOSABLE) ×1 IMPLANT
GOWN STRL REUS W/TWL LRG LVL3 (GOWN DISPOSABLE) ×2
GOWN STRL REUS W/TWL XL LVL3 (GOWN DISPOSABLE) ×1
HEMOSTAT SNOW SURGICEL 2X4 (HEMOSTASIS) IMPLANT
KIT BASIN OR (CUSTOM PROCEDURE TRAY) ×1 IMPLANT
KIT SHUNT ARGYLE CAROTID ART 6 (VASCULAR PRODUCTS) IMPLANT
KIT TURNOVER KIT B (KITS) ×1 IMPLANT
LOOP VASCULAR MINI 18 RED (MISCELLANEOUS) ×1
NDL HYPO 25GX1X1/2 BEV (NEEDLE) IMPLANT
NEEDLE HYPO 25GX1X1/2 BEV (NEEDLE) IMPLANT
NS IRRIG 1000ML POUR BTL (IV SOLUTION) ×3 IMPLANT
PACK CAROTID (CUSTOM PROCEDURE TRAY) ×1 IMPLANT
PAD ARMBOARD 7.5X6 YLW CONV (MISCELLANEOUS) ×2 IMPLANT
PATCH VASC XENOSURE 1CMX6CM (Vascular Products) ×1 IMPLANT
PATCH VASC XENOSURE 1X6 (Vascular Products) IMPLANT
POSITIONER HEAD DONUT 9IN (MISCELLANEOUS) ×1 IMPLANT
SHUNT CAROTID FLEXCEL 14.5 (SHUNT) IMPLANT
STRIP CLOSURE SKIN 1/2X4 (GAUZE/BANDAGES/DRESSINGS) ×1 IMPLANT
SUT MNCRL AB 4-0 PS2 18 (SUTURE) ×1 IMPLANT
SUT PROLENE 6 0 BV (SUTURE) ×2 IMPLANT
SUT SILK 3 0 (SUTURE)
SUT SILK 3-0 18XBRD TIE 12 (SUTURE) IMPLANT
SUT VIC AB 2-0 CT1 27 (SUTURE) ×1
SUT VIC AB 2-0 CT1 TAPERPNT 27 (SUTURE) ×1 IMPLANT
SUT VIC AB 3-0 SH 27 (SUTURE) ×1
SUT VIC AB 3-0 SH 27X BRD (SUTURE) ×1 IMPLANT
SYR CONTROL 10ML LL (SYRINGE) IMPLANT
TOWEL GREEN STERILE (TOWEL DISPOSABLE) ×1 IMPLANT
TUBE CONNECTING 20X1/4 (TUBING) IMPLANT
VASCULAR TIE MINI RED 18IN STL (MISCELLANEOUS) IMPLANT
WATER STERILE IRR 1000ML POUR (IV SOLUTION) ×1 IMPLANT

## 2022-06-18 NOTE — Anesthesia Postprocedure Evaluation (Signed)
Anesthesia Post Note  Patient: Kurt Kidd  Procedure(s) Performed: ENDARTERECTOMY CAROTID WITH BOVINE PARICARDIAL PATCH. (Left: Neck)     Patient location during evaluation: PACU Anesthesia Type: General Level of consciousness: sedated Pain management: pain level controlled Vital Signs Assessment: post-procedure vital signs reviewed and stable Respiratory status: spontaneous breathing and respiratory function stable Cardiovascular status: stable Postop Assessment: no apparent nausea or vomiting Anesthetic complications: no   No notable events documented.  Last Vitals:  Vitals:   06/18/22 1115 06/18/22 1130  BP: 133/77 (!) 147/81  Pulse: 77 82  Resp: 14 12  Temp:  36.7 C  SpO2: 99% 93%    Last Pain:  Vitals:   06/18/22 1130  TempSrc:   PainSc: 0-No pain                 Eryx Zane DANIEL

## 2022-06-18 NOTE — Transfer of Care (Signed)
Immediate Anesthesia Transfer of Care Note  Patient: Kurt Kidd  Procedure(s) Performed: ENDARTERECTOMY CAROTID WITH BOVINE PARICARDIAL PATCH. (Left: Neck)  Patient Location: PACU  Anesthesia Type:General  Level of Consciousness: awake, alert , and oriented  Airway & Oxygen Therapy: Patient Spontanous Breathing and Patient connected to nasal cannula oxygen  Post-op Assessment: Report given to RN, Post -op Vital signs reviewed and stable, Patient moving all extremities X 4, and Patient able to stick tongue midline  Post vital signs: Reviewed and stable  Last Vitals:  Vitals Value Taken Time  BP 132/67 06/18/22 1001  Temp    Pulse 81 06/18/22 1005  Resp 20 06/18/22 1005  SpO2 95 % 06/18/22 1005  Vitals shown include unvalidated device data.  Last Pain:  Vitals:   06/18/22 0610  TempSrc:   PainSc: 0-No pain         Complications: No notable events documented.

## 2022-06-18 NOTE — Anesthesia Procedure Notes (Signed)
Procedure Name: Intubation Date/Time: 06/18/2022 7:47 AM  Performed by: Nils Pyle, CRNAPre-anesthesia Checklist: Patient identified, Emergency Drugs available, Suction available and Patient being monitored Patient Re-evaluated:Patient Re-evaluated prior to induction Oxygen Delivery Method: Circle System Utilized Preoxygenation: Pre-oxygenation with 100% oxygen Induction Type: IV induction Ventilation: Mask ventilation without difficulty and Oral airway inserted - appropriate to patient size Laryngoscope Size: Hyacinth Meeker and 2 Grade View: Grade I Tube type: Oral Tube size: 7.5 mm Number of attempts: 1 Airway Equipment and Method: Stylet and Oral airway Placement Confirmation: ETT inserted through vocal cords under direct vision, positive ETCO2 and breath sounds checked- equal and bilateral Secured at: 23 cm Tube secured with: Tape Dental Injury: Teeth and Oropharynx as per pre-operative assessment

## 2022-06-18 NOTE — Progress Notes (Signed)
  Progress Note    06/18/2022 3:52 PM Day of Surgery  Subjective: sitting up in chair. has headache otherwise says he is feeling great. Denies any trouble talking or swallowing   Vitals:   06/18/22 1400 06/18/22 1500  BP: 132/74 (!) 142/71  Pulse: 88 89  Resp: 19 16  Temp:    SpO2: 94% 99%   Physical Exam: Cardiac:  regular Lungs:  non labored Incisions:  left neck incision is soft without swelling or hematoma. A little oozing under the honeycomb dressings Extremities: moving all extremities without deficits Neurologic: alert and oriented. Speech coherent. Tongue midline. Smile symmetric  CBC    Component Value Date/Time   WBC 7.9 06/18/2022 0553   RBC 4.69 06/18/2022 0553   HGB 14.0 06/18/2022 0553   HCT 40.2 06/18/2022 0553   PLT 254 06/18/2022 0553   MCV 85.7 06/18/2022 0553   MCH 29.9 06/18/2022 0553   MCHC 34.8 06/18/2022 0553   RDW 13.9 06/18/2022 0553   LYMPHSABS 1.5 05/27/2017 1008   MONOABS 0.8 05/27/2017 1008   EOSABS 0.2 05/27/2017 1008   BASOSABS 0.1 05/27/2017 1008    BMET    Component Value Date/Time   NA 140 06/18/2022 0553   K 4.5 06/18/2022 0553   CL 103 06/18/2022 0553   CO2 27 06/18/2022 0553   GLUCOSE 156 (H) 06/18/2022 0553   BUN 16 06/18/2022 0553   CREATININE 0.78 06/18/2022 0553   CREATININE 0.68 (L) 05/16/2021 0823   CALCIUM 9.5 06/18/2022 0553   GFRNONAA >60 06/18/2022 0553   GFRAA 127 03/16/2008 0751    INR    Component Value Date/Time   INR 1.0 06/18/2022 0553     Intake/Output Summary (Last 24 hours) at 06/18/2022 1552 Last data filed at 06/18/2022 1342 Gross per 24 hour  Intake 1100 ml  Output 875 ml  Net 225 ml     Assessment/Plan:  72 y.o. male is s/p L CEA Day of Surgery   Doing well post operatively Neurologically intact Left neck incision is intact, soft without swelling or hematoma. A little oozing under honeycomb dressings Continue routine post op care   Graceann Congress, [place] [facility] [phone] 06/18/2022 3:52 PM

## 2022-06-18 NOTE — Anesthesia Procedure Notes (Signed)
Arterial Line Insertion Start/End4/15/2024 7:05 AM, 06/18/2022 7:16 AM Performed by: Nils Pyle, CRNA  Patient location: Pre-op. Preanesthetic checklist: patient identified, IV checked, site marked, risks and benefits discussed, surgical consent, monitors and equipment checked, pre-op evaluation and anesthesia consent Lidocaine 1% used for infiltration Right, radial was placed Catheter size: 20 G Hand hygiene performed  and maximum sterile barriers used   Attempts: 1 Procedure performed using ultrasound guided technique. Ultrasound Notes:anatomy identified, needle tip was noted to be adjacent to the nerve/plexus identified and no ultrasound evidence of intravascular and/or intraneural injection Following insertion, dressing applied and Biopatch. Post procedure assessment: normal and unchanged  Patient tolerated the procedure well with no immediate complications.

## 2022-06-18 NOTE — Interval H&P Note (Signed)
History and Physical Interval Note:  06/18/2022 7:13 AM  Kurt Kidd  has presented today for surgery, with the diagnosis of Bilateral carotid artery stenosis.  The various methods of treatment have been discussed with the patient and family. After consideration of risks, benefits and other options for treatment, the patient has consented to  Procedure(s): ENDARTERECTOMY CAROTID (Left) as a surgical intervention.  The patient's history has been reviewed, patient examined, no change in status, stable for surgery.  I have reviewed the patient's chart and labs.  Questions were answered to the patient's satisfaction.     Leonie Douglas

## 2022-06-18 NOTE — Progress Notes (Signed)
PHARMACIST - PHYSICIAN ORDER COMMUNICATION  CONCERNING: P&T Medication Policy on Herbal Medications  DESCRIPTION:  This patient's order for: L-lysine has been noted.  This product(s) is classified as an "herbal" or natural product. Due to a lack of definitive safety studies or FDA approval, nonstandard manufacturing practices, plus the potential risk of unknown drug-drug interactions while on inpatient medications, the Pharmacy and Therapeutics Committee does not permit the use of "herbal" or natural products of this type within [facility].   ACTION TAKEN: The pharmacy department is unable to verify this order at this time and your patient has been informed of this safety policy. Please reevaluate patient's clinical condition at discharge and address if the herbal or natural product(s) should be resumed at that time.   Rexford Maus, PharmD, BCPS 06/18/2022 4:33 PM

## 2022-06-18 NOTE — Op Note (Signed)
DATE OF SERVICE: 06/18/2022  PATIENT:  Kurt Kidd  72 y.o. male  PRE-OPERATIVE DIAGNOSIS:  critical, asymptomatic left carotid artery stenosis  POST-OPERATIVE DIAGNOSIS:  Same  PROCEDURE:   Left carotid endarterectomy with bovine pericardial patch angioplasty  SURGEON:  Surgeon(s) and Role:    * Leonie Douglas, MD - Primary  ASSISTANT: Lianne Cure, PA-C  An experienced assistant was required given the complexity of this procedure and the standard of surgical care. My assistant helped with exposure through counter tension, suctioning, ligation and retraction to better visualize the surgical field.  My assistant expedited sewing during the case by following my sutures. Wherever I use the term "we" in the report, my assistant actively helped me with that portion of the procedure.  ANESTHESIA:   general  EBL:  BLOOD ADMINISTERED:none  DRAINS: none   LOCAL MEDICATIONS USED:  NONE  SPECIMEN:  none  COUNTS: confirmed correct.  TOURNIQUET:  none  PATIENT DISPOSITION:  PACU - hemodynamically stable.   Delay start of Pharmacological VTE agent (>24hrs) due to surgical blood loss or risk of bleeding: no  INDICATION FOR PROCEDURE: Kurt Kidd is a 72 y.o. male with critical, asymptomatic left carotid artery stenosis. After careful discussion of risks, benefits, and alternatives the patient was offered left carotid endarterectomy. The patient understood and wished to proceed.  OPERATIVE FINDINGS: excellent pulsatile backbleeding. Did not use shunt. Critical stenosis at bifurcation. Disease extending 2-3cm into internal carotid artery. All plaque up to this point removed with endarterectomy. Good backbleeding from external - minimal plaque there. No real disease in common carotid. Good result from endarterectomy. Low resistance waveform over internal carotid artery. No flaps or technical defects on duplex ultrasound. Woke up neurologically intact, with midline tongue,  clear voice, and moving all extremities to command.   DESCRIPTION OF PROCEDURE: After identification of the patient in the pre-operative holding area, the patient was transferred to the operating room. The patient was positioned supine on the operating room table. Anesthesia was induced. The left neck was prepped and draped in standard fashion. A surgical pause was performed confirming correct patient, procedure, and operative location.  Intraoperative ultrasound was used to map the course of the left carotid artery and its bifurcation.  Incision was planned on the skin made with a 10 blade.  Incision was carried down through subcutaneous tissue and platysma with Bovie electrocautery.  The platysma was grasped with Allis clamps and elevated.  An avascular plane over the sternocleidomastoid was developed.  The sternocleidomastoid was rotated laterally and posteriorly.  The carotid sheath was identified and entered carefully.  The jugular vein was skeletonized.  The facial vein was identified.  This was encircled, ligated with silk, and divided.  The carotid bifurcation was identified.  The carotid artery was skeletonized.  The patient was systemically heparinized.  Activated clotting time measurements were used throughout the case to confirm adequate anticoagulation.  A clamp was applied to the healthy internal carotid artery, followed by the common carotid artery, followed by the external carotid artery.  An anterior arteriotomy was made on the common carotid area and extending onto the diseased internal carotid artery with Potts scissors.  We reached the end of the diseased segment, as predicted on CT scanner after 2 to 3 cm.  We tested backbleeding from the internal carotid artery.  This was brisk and pulsatile.  I elected to proceed without a shunt.  An endarterectomy was performed using standard technique with a Astronomer.  All  plaque was removed.  We then carefully inspected the endarterectomy plane  in a methodical fashion removing all debris and flaps when identified.  Once satisfied with the complete endarterectomy, we proceeded with patch angioplasty.  A bovine pericardial patch was prepared per manufacturer's instructions and brought to the table for use.  This was sewn to the arteriotomy using continuous running suture of 6-0 Prolene.  Immediately prior to completion the patch was flushed and de-aired by backbleeding with the external carotid artery.  Several points of leak were identified and repaired with 6-0 Prolene.  Hemostasis was achieved.  Clamp was then released on the common carotid artery.  Several beats of systole were allowed to proceed before releasing the clamp on the internal carotid artery.  The repair was then evaluated with Doppler machine.  Excellent, low resistance waveform was heard over the internal carotid artery.  A normal, high resistance waveform was heard over the external carotid artery.  A mixed waveform was heard in the common carotid artery.  The wound was then irrigated and the repair evaluated with duplex ultrasound.  No flaps or technical defect was noted.  Satisfied we ended the case here.  Heparin reversed with protamine.  Hemostasis was again confirmed in the patch and in the surgical bed.  The platysma was reapproximated with 3-0 Vicryl.  The skin was closed with 4-0 Monocryl.  Steri-Strips were applied.  The patient awoke neurologically normal.  He followed commands.  He had a midline tongue.  He had a clear voice.  He followed commands moving all four extremities.  Upon completion of the case instrument and sharps counts were confirmed correct. The patient was transferred to the PACU in good condition. I was present for all portions of the procedure.  Rande Brunt. Lenell Antu, MD [facility] Phone Number: [phone] 06/18/2022 10:03 AM

## 2022-06-19 ENCOUNTER — Encounter (HOSPITAL_COMMUNITY): Payer: Self-pay | Admitting: Vascular Surgery

## 2022-06-19 LAB — BASIC METABOLIC PANEL
Anion gap: 10 (ref 5–15)
BUN: 15 mg/dL (ref 8–23)
CO2: 24 mmol/L (ref 22–32)
Calcium: 8.8 mg/dL — ABNORMAL LOW (ref 8.9–10.3)
Chloride: 98 mmol/L (ref 98–111)
Creatinine, Ser: 0.67 mg/dL (ref 0.61–1.24)
GFR, Estimated: >60 mL/min (ref >60)
Glucose, Bld: 145 mg/dL — ABNORMAL HIGH (ref 70–99)
Potassium: 4 mmol/L (ref 3.5–5.1)
Sodium: 132 mmol/L — ABNORMAL LOW (ref 135–145)

## 2022-06-19 LAB — LIPID PANEL
Cholesterol: 125 mg/dL (ref 0–200)
HDL: 49 mg/dL (ref >40)
LDL Cholesterol: 63 mg/dL (ref 0–99)
Total CHOL/HDL Ratio: 2.6 RATIO
Triglycerides: 66 mg/dL (ref <150)
VLDL: 13 mg/dL (ref 0–40)

## 2022-06-19 LAB — CBC
HCT: 35.8 % — ABNORMAL LOW (ref 39.0–52.0)
Hemoglobin: 12.5 g/dL — ABNORMAL LOW (ref 13.0–17.0)
MCH: 29.8 pg (ref 26.0–34.0)
MCHC: 34.9 g/dL (ref 30.0–36.0)
MCV: 85.2 fL (ref 80.0–100.0)
Platelets: 238 10*3/uL (ref 150–400)
RBC: 4.2 MIL/uL — ABNORMAL LOW (ref 4.22–5.81)
RDW: 13.7 % (ref 11.5–15.5)
WBC: 12.5 10*3/uL — ABNORMAL HIGH (ref 4.0–10.5)
nRBC: 0 % (ref 0.0–0.2)

## 2022-06-19 LAB — GLUCOSE, CAPILLARY: Glucose-Capillary: 134 mg/dL — ABNORMAL HIGH (ref 70–99)

## 2022-06-19 MED ORDER — METFORMIN HCL 500 MG PO TABS: 500.0000 mg | ORAL_TABLET | Freq: Two times a day (BID) | ORAL | Status: DC

## 2022-06-19 MED ORDER — ASPIRIN 81 MG PO TBEC: 81.0000 mg | DELAYED_RELEASE_TABLET | Freq: Every day | ORAL | Status: AC

## 2022-06-19 MED ORDER — TRAMADOL HCL 50 MG PO TABS: 50.0000 mg | ORAL_TABLET | Freq: Four times a day (QID) | ORAL | 0 refills | Status: DC | PRN

## 2022-06-19 NOTE — Progress Notes (Signed)
PHARMACIST LIPID MONITORING   Kurt Kidd is a 72 y.o. male admitted on 06/18/2022 with critical, asymptomatic left carotid artery stenosis .  Pharmacy has been consulted to optimize lipid-lowering therapy with the indication of secondary prevention for clinical ASCVD.  Recent Labs:  Lipid Panel (last 6 months):   Lab Results  Component Value Date   CHOL 125 06/19/2022   TRIG 66 06/19/2022   HDL 49 06/19/2022   CHOLHDL 2.6 06/19/2022   VLDL 13 06/19/2022   LDLCALC 63 06/19/2022    Hepatic function panel (last 6 months):   Lab Results  Component Value Date   AST 30 06/18/2022   ALT 36 06/18/2022   ALKPHOS 74 06/18/2022   BILITOT 0.8 06/18/2022    SCr (since admission):   Serum creatinine: 0.67 mg/dL 16/10/96 0454 Estimated creatinine clearance: 82.5 mL/min  Current therapy and lipid therapy tolerance Current lipid-lowering therapy: Atorvastatin , Zetia  Previous lipid-lowering therapies (if applicable): n/a Documented or reported allergies or intolerances to lipid-lowering therapies (if applicable): n/a  Assessment:   LDL at goal. No changes needed at this time.   Plan:    1.Statin intensity (high intensity recommended for all patients regardless of the LDL):  No statin changes. The patient is already on a high intensity statin.  2.Add ezetimibe (if any one of the following):  Patient already on ezetimibe  3.Refer to lipid clinic:   No  4.Follow-up with:  Primary care provider - Kurt Sax, MD  5.Follow-up labs after discharge:  No changes in lipid therapy, repeat a lipid panel in one year.       Paraskevi Funez A. Jeanella Craze, PharmD, BCPS, [facility] Clinical Pharmacist [place] Please utilize Amion for appropriate phone number to reach the unit pharmacist [facility])  06/19/2022, 8:44 AM

## 2022-06-19 NOTE — Progress Notes (Addendum)
  Progress Note    06/19/2022 6:37 AM 1 Day Post-Op  Subjective:  feels good this morning.  Has voided.  No difficulty swallowing but a little sore throat.  Has not walked in the hallway yet.   Afebrile HR 60's-100's NSR 110's-140's systolic 97% RA  Vitals:   06/18/22 2359 06/19/22 0405  BP: (!) 111/50 136/75  Pulse: 66 70  Resp: 16 16  Temp: 98.9 F (37.2 C) (!) 97.1 F (36.2 C)  SpO2: 96% 97%    Physical Exam: General:  no distress Cardiac:  regular Lungs:  non labored Incisions:  clean with mild bloody drainage on bandage.  No hematoma present Neuro:  moving all extremities equally; tongue is midline  CBC    Component Value Date/Time   WBC 12.5 (H) 06/19/2022 0325   RBC 4.20 (L) 06/19/2022 0325   HGB 12.5 (L) 06/19/2022 0325   HCT 35.8 (L) 06/19/2022 0325   PLT 238 06/19/2022 0325   MCV 85.2 06/19/2022 0325   MCH 29.8 06/19/2022 0325   MCHC 34.9 06/19/2022 0325   RDW 13.7 06/19/2022 0325   LYMPHSABS 1.5 05/27/2017 1008   MONOABS 0.8 05/27/2017 1008   EOSABS 0.2 05/27/2017 1008   BASOSABS 0.1 05/27/2017 1008    BMET    Component Value Date/Time   NA 132 (L) 06/19/2022 0325   K 4.0 06/19/2022 0325   CL 98 06/19/2022 0325   CO2 24 06/19/2022 0325   GLUCOSE 145 (H) 06/19/2022 0325   BUN 15 06/19/2022 0325   CREATININE 0.67 06/19/2022 0325   CREATININE 0.68 (L) 05/16/2021 0823   CALCIUM 8.8 (L) 06/19/2022 0325   GFRNONAA >60 06/19/2022 0325   GFRAA 127 03/16/2008 0751    INR    Component Value Date/Time   INR 1.0 06/18/2022 0553     Intake/Output Summary (Last 24 hours) at 06/19/2022 0637 Last data filed at 06/19/2022 0358 Gross per 24 hour  Intake 1580 ml  Output 2025 ml  Net -445 ml      Assessment/Plan:  72 y.o. male is s/p:  Left CEA   1 Day Post-Op   -pt doing well this morning and neuro in tact.   -acute surgical blood loss anemia-tolerating -pt has voided and swallowing without difficulty.   -needs to walk in hallways   -discharge home and f/u in 4 weeks on Dr. Lenell Antu clinic day.   -continue asa/statin    Doreatha Massed, PA-C [facility] [phone] 06/19/2022 6:37 AM  VASCULAR STAFF ADDENDUM: I have independently interviewed and examined the patient. I agree with the above.  Looks great POD#1 L CEA for asymptomatic critical stenosis. Safe for discharge. Follow up with me as above with carotid duplex.   Rande Brunt. Lenell Antu, MD [facility] of [facility] Phone Number: [phone] 06/19/2022 9:20 AM

## 2022-06-19 NOTE — Discharge Summary (Signed)
Discharge Summary     Kurt Kidd 09-17-1950 72 y.o. male  [id number]  Admission Date: 06/18/2022  Discharge Date: 06/22/2022  Physician: No att. providers found  Admission Diagnosis: Carotid stenosis, asymptomatic [I65.29]   HPI:   This is a 72 y.o. male referred to clinic to evaluate carotid artery stenosis. Dr. Rennis Golden has been following this for some time. He has no symptoms of stroke or mini-stroke. No facial droop, visual changes, no unilateral weakness. We spent the bulk of the visit reviewing the natural history of carotid artery stenosis. I explained the rationale for intervention in asymptomatic patients at 80%. We reviewed the plan for surveillance.    06/12/22: Returns to discuss CT angiogram results.  I counseled him about the worse than expected carotid stenosis in the left carotid bifurcation.  We reviewed his unique anatomy.  Ultimately I recommended a carotid endarterectomy given the low bifurcation in his neck.  Counseled him about his 1 to 2% risk of perioperative stroke, and risk of cranial nerve injury and hematoma.  Hospital Course:  The patient was admitted to the hospital and taken to the operating room on 06/18/2022 and underwent left CEA    Findings: excellent pulsatile backbleeding. Did not use shunt. Critical stenosis at bifurcation. Disease extending 2-3cm into internal carotid artery. All plaque up to this point removed with endarterectomy. Good backbleeding from external - minimal plaque there. No real disease in common carotid. Good result from endarterectomy. Low resistance waveform over internal carotid artery. No flaps or technical defects on duplex ultrasound. Woke up neurologically intact, with midline tongue, clear voice, and moving all extremities to command.   The pt tolerated the procedure well and was transported to the PACU in good condition.   By POD 1, the pt neuro status in tact.  He was able to void, ambulate and take in solids without  difficulty.   He was discharged home.   No results for input(s): "NA", "K", "CL", "CO2", "GLUCOSE", "BUN", "CALCIUM" in the last 72 hours.  Invalid input(s): "CRATININE"  No results for input(s): "WBC", "HGB", "HCT", "PLT" in the last 72 hours.  No results for input(s): "INR" in the last 72 hours.    Discharge Instructions     Discharge patient   Complete by: As directed    Discharge pt once he has walked in hallway, had breakfast and been seen by Dr. Lenell Antu.   Discharge disposition: 01-Home or Self Care   Discharge patient date: 06/19/2022       Discharge Diagnosis:  Carotid stenosis, asymptomatic [I65.29]  Secondary Diagnosis: Patient Active Problem List   Diagnosis Date Noted   Carotid stenosis, asymptomatic 06/18/2022   Right flank pain 03/30/2021   Need for vaccination against Streptococcus pneumoniae 03/30/2021   Encounter for hepatitis C screening test for low risk patient 03/30/2021   Frequent urination 01/16/2021   Abnormal urinary stream 01/16/2021   Microalbuminuria 01/16/2021   Oral herpes 10/27/2020   Arthritis of both hands 10/17/2020   Chronic pain of left ankle 11/23/2019   Acute eczematoid otitis externa of left ear 05/18/2019   Plantar fasciitis of left foot 05/18/2019   Bilateral carotid artery stenosis 02/16/2019   Need for influenza vaccination 02/16/2019   Asthmatic bronchitis 03/28/2018   Pterygium eye, right 02/17/2018   H/O iron deficiency anemia 05/27/2017   Low serum iron 06/23/2016   Absolute anemia 06/22/2016   Family history of abdominal aortic aneurysm 06/04/2016   Left carotid bruit 06/04/2016   Former smoker 06/04/2016  Carotid artery disease 05/21/2016   Right hand pain 12/30/2015   Diabetes mellitus type 2, noninsulin dependent 05/11/2014   Low back pain of over 3 months duration 08/24/2013   Shortness of breath 07/13/2013   Adjustment disorder with mixed anxiety and depressed mood 07/07/2013   Benign prostatic hyperplasia  with urinary hesitancy 12/29/2012   Essential hypertension    Coronary artery disease involving native coronary artery of native heart without angina pectoris    Dyslipidemia    GERD (gastroesophageal reflux disease)    Diverticulosis    Colitis, ischemic    Onychomycosis    Palpitations    Mitral regurgitation    Overweight 06/01/2007   Mixed hyperlipidemia 04/14/2007   BRONCHITIS, ACUTE 04/14/2007   Osteoarthritis 04/14/2007   Past Medical History:  Diagnosis Date   Blood transfusion without reported diagnosis    CAD (coronary artery disease)    DES.. 2004 /  catheterization 2008, 10% narrowing stent site, pain not cardiac   Colitis, ischemic    Colon polyp    Degenerative joint disease    Diabetes mellitus 2012   Diverticulosis    Dyslipidemia    Ejection fraction    65-70%, echo, March, 2012   GERD (gastroesophageal reflux disease)    Hyperlipidemia    Hypertension    Mitral regurgitation    Echo, mild, 2012   Murmur    Onychomycosis    Overweight(278.02)    Palpitations    Rapid heartbeat and shortness of breath after eating 2008 recurrent episode 2012    Allergies as of 06/19/2022   No Known Allergies      Medication List     TAKE these medications    aspirin EC 81 MG tablet Take 1 tablet (81 mg total) by mouth daily. Swallow whole.   atorvastatin 80 MG tablet Commonly known as: LIPITOR Take 1 tablet by mouth once daily What changed: when to take this   esomeprazole 20 MG capsule Commonly known as: NEXIUM Take 20 mg by mouth daily before breakfast.   ezetimibe 10 MG tablet Commonly known as: ZETIA Take 1 tablet by mouth once daily What changed: when to take this   LYSINE PO Take 1,000 mg by mouth 2 (two) times daily as needed (fever blisters).   metFORMIN 500 MG tablet Commonly known as: GLUCOPHAGE Take 1 tablet (500 mg total) by mouth in the morning and at bedtime.   metoprolol tartrate 50 MG tablet Commonly known as: LOPRESSOR Take 1  tablet by mouth twice daily   multivitamin with minerals tablet Take 1 tablet by mouth in the morning.   ONE TOUCH ULTRA SYSTEM KIT w/Device Kit 1 kit by Does not apply route once.   SM Sweet Oil external oil Generic drug: olive oil Apply 1-2 drops topically at bedtime. Applied to a Q-tip and applied into ears nightly   tamsulosin 0.4 MG Caps capsule Commonly known as: FLOMAX Take 1 capsule by mouth once daily What changed: when to take this   traMADol 50 MG tablet Commonly known as: Ultram Take 1 tablet (50 mg total) by mouth every 6 (six) hours as needed.         [facility] Discharge Instructions Carotid Endarterectomy (CEA)  Please refer to the following instructions for your post-procedure care. Your surgeon or physician assistant will discuss any changes with you.  Activity  You are encouraged to walk as much as you can. You can slowly return to normal activities but must avoid  strenuous activity and heavy lifting until your doctor tell you it's OK. Avoid activities such as vacuuming or swinging a golf club. You can drive after one week if you are comfortable and you are no longer taking prescription pain medications. It is normal to feel tired for serval weeks after your surgery. It is also normal to have difficulty with sleep habits, eating, and bowel movements after surgery. These will go away with time.  Bathing/Showering  You may shower after you come home. Do not soak in a bathtub, hot tub, or swim until the incision heals completely.  Incision Care  Shower every day. Clean your incision with mild soap and water. Pat the area dry with a clean towel. You do not need a bandage unless otherwise instructed. Do not apply any ointments or creams to your incision. You may have skin glue on your incision. Do not peel it off. It will come off on its own in about one week. Your incision may feel thickened and raised for several weeks after  your surgery. This is normal and the skin will soften over time. For Men Only: It's OK to shave around the incision but do not shave the incision itself for 2 weeks. It is common to have numbness under your chin that could last for several months.  Diet  Resume your normal diet. There are no special food restrictions following this procedure. A low fat/low cholesterol diet is recommended for all patients with vascular disease. In order to heal from your surgery, it is CRITICAL to get adequate nutrition. Your body requires vitamins, minerals, and protein. Vegetables are the best source of vitamins and minerals. Vegetables also provide the perfect balance of protein. Processed food has little nutritional value, so try to avoid this.  Medications  Resume taking all of your medications unless your doctor or physician assistant tells you not to.  If your incision is causing pain, you may take over-the- counter pain relievers such as acetaminophen (Tylenol). If you were prescribed a stronger pain medication, please be aware these medications can cause nausea and constipation.  Prevent nausea by taking the medication with a snack or meal. Avoid constipation by drinking plenty of fluids and eating foods with a high amount of fiber, such as fruits, vegetables, and grains.  Do not take Tylenol if you are taking prescription pain medications.  Follow Up  Our office will schedule a follow up appointment 2-3 weeks following discharge.  Please call us immediately for any of the following conditions  Increased pain, redness, drainage (pus) from your incision site. Fever of 101 degrees or higher. If you should develop stroke (slurred speech, difficulty swallowing, weakness on one side of your body, loss of vision) you should call 911 and go to the nearest emergency room.  Reduce your risk of vascular disease:  Stop smoking. If you would like help call [facility] at 1-800-QUIT-NOW ((831) 296-3403) or [facility] at (225)043-6622. Manage your cholesterol Maintain a desired weight Control your diabetes Keep your blood pressure down  If you have any questions, please call the office at [phone].  Prescriptions given: 1.  Tramadol #8 No Refill 2.   Aspirin  daily OTC  Disposition: home  Patient's condition: is Good  Follow up: 1. Dr. Lenell Antu 4 weeks with carotid duplex.    Doreatha Massed, PA-C [facility] [phone]   --- For [facility] use ---   Modified Rankin score at D/C (0-6): 0  IV medication needed for:  1.  Hypertension: No 2. Hypotension: No  Post-op Complications: No  1. Post-op CVA or TIA: No  If yes: Event classification (right eye, left eye, right cortical, left cortical, verterobasilar, other): n/a  If yes: Timing of event (intra-op, <6 hrs post-op, >=6 hrs post-op, unknown): n/a  2. CN injury: No  If yes: CN n/a injuried   3. Myocardial infarction: No  If yes: Dx by (EKG or clinical, Troponin): n/a  4.  CHF: No  5.  Dysrhythmia (new): No  6. Wound infection: No  7. Reperfusion symptoms: No  8. Return to OR: No  If yes: return to OR for (bleeding, neurologic, other CEA incision, other): n/a  Discharge medications: Statin use:  Yes ASA use:  Yes   Beta blocker use:  Yes ACE-Inhibitor use:  No  ARB use:  No CCB use: No P2Y12 Antagonist use: No, [ ]  Plavix, [ ]  Plasugrel, [ ]  Ticlopinine, [ ]  Ticagrelor, [ ]  Other, [ ]  No for medical reason, [ ]  Non-compliant, [ ]  Not-indicated Anti-coagulant use:  No, [ ]  Warfarin, [ ]  Rivaroxaban, [ ]  Dabigatran,

## 2022-06-19 NOTE — Discharge Instructions (Signed)
   [facility]  Discharge Instructions   Carotid Surgery  Please refer to the following instructions for your post-procedure care. Your surgeon or physician assistant will discuss any changes with you.  Activity  You are encouraged to walk as much as you can. You can slowly return to normal activities but must avoid strenuous activity and heavy lifting until your doctor tell you it's okay. Avoid activities such as vacuuming or swinging a golf club. You can drive after one week if you are comfortable and you are no longer taking prescription pain medications. It is normal to feel tired for serval weeks after your surgery. It is also normal to have difficulty with sleep habits, eating, and bowel movements after surgery. These will go away with time.  Bathing/Showering  Shower daily after you go home. Do not soak in a bathtub, hot tub, or swim until the incision heals completely.  Incision Care  Shower every day. Clean your incision with mild soap and water. Pat the area dry with a clean towel. You do not need a bandage unless otherwise instructed. Do not apply any ointments or creams to your incision. You may have skin glue on your incision. Do not peel it off. It will come off on its own in about one week. Your incision may feel thickened and raised for several weeks after your surgery. This is normal and the skin will soften over time.   For Men Only: It's okay to shave around the incision but do not shave the incision itself for 2 weeks. It is common to have numbness under your chin that could last for several months.  Diet  Resume your normal diet. There are no special food restrictions following this procedure. A low fat/low cholesterol diet is recommended for all patients with vascular disease. In order to heal from your surgery, it is CRITICAL to get adequate nutrition. Your body requires vitamins, minerals, and protein. Vegetables are the best source of  vitamins and minerals. Vegetables also provide the perfect balance of protein. Processed food has little nutritional value, so try to avoid this.  Medications  Resume taking all of your medications unless your doctor or physician assistant tells you not to. If your incision is causing pain, you may take over-the- counter pain relievers such as acetaminophen (Tylenol). If you were prescribed a stronger pain medication, please be aware these medications can cause nausea and constipation. Prevent nausea by taking the medication with a snack or meal. Avoid constipation by drinking plenty of fluids and eating foods with a high amount of fiber, such as fruits, vegetables, and grains.   Do not take Tylenol if you are taking prescription pain medications.  Follow Up  Our office will schedule a follow up appointment 2-3 weeks following discharge.  Please call us immediately for any of the following conditions  . Increased pain, redness, drainage (pus) from your incision site. . Fever of 101 degrees or higher. . If you should develop stroke (slurred speech, difficulty swallowing, weakness on one side of your body, loss of vision) you should call 911 and go to the nearest emergency room. .  Reduce your risk of vascular disease:  . Stop smoking. If you would like help call [facility] at 1-800-QUIT-NOW (1-800-784-8669) or [place] [facility] at 336-586-4000. . Manage your cholesterol . Maintain a desired weight . Control your diabetes . Keep your blood pressure down .  If you have any questions, please call the office at [phone]. 

## 2022-06-27 ENCOUNTER — Telehealth: Payer: Self-pay

## 2022-06-27 NOTE — Telephone Encounter (Addendum)
9:36 am- Pt's Wife LVM stating she would like clarification on when he husband can resume his normal activities (Pressure Washing) after CEA on 06/18/22.   11 am- I attempted to reach Gavin Pound but no answer, LVMTCB  12:04 pm- Patient's Wife returned called. I advised that Mr. Folts will need to wait until after his Post-op appt on 07/17/22 to be cleared by surgeon. Wife voiced her understanding and states she will notify her husband.

## 2022-07-02 ENCOUNTER — Other Ambulatory Visit: Payer: Self-pay | Admitting: Family Medicine

## 2022-07-02 DIAGNOSIS — I10 Essential (primary) hypertension: Secondary | ICD-10-CM

## 2022-07-03 ENCOUNTER — Ambulatory Visit: Payer: PPO | Admitting: Vascular Surgery

## 2022-07-04 ENCOUNTER — Telehealth: Payer: Self-pay | Admitting: Family Medicine

## 2022-07-04 DIAGNOSIS — B002 Herpesviral gingivostomatitis and pharyngotonsillitis: Secondary | ICD-10-CM

## 2022-07-04 NOTE — Telephone Encounter (Signed)
[facility] [phone]   Pts wife called and is asking if you will prescribe valtrex for his fever blisters. I suggested an appt  due to he was seen 10/23. She asked if I would ask anyway.

## 2022-07-04 NOTE — Telephone Encounter (Signed)
Returned call spoke with patient who states that he has a fever blisters that will not go away with blistix. Last Rx sent in 2022 patient states that this worked. Please advise

## 2022-07-05 MED ORDER — VALACYCLOVIR HCL 1 G PO TABS
500.0000 mg | ORAL_TABLET | Freq: Two times a day (BID) | ORAL | 0 refills | Status: AC
Start: 2022-07-05 — End: 2022-07-08

## 2022-07-05 NOTE — Telephone Encounter (Signed)
Patient aware Rx sent in will pick up today.

## 2022-07-09 ENCOUNTER — Other Ambulatory Visit: Payer: Self-pay | Admitting: *Deleted

## 2022-07-09 DIAGNOSIS — I6523 Occlusion and stenosis of bilateral carotid arteries: Secondary | ICD-10-CM

## 2022-07-16 ENCOUNTER — Ambulatory Visit (HOSPITAL_COMMUNITY)
Admission: RE | Admit: 2022-07-16 | Discharge: 2022-07-16 | Disposition: A | Discharge status: Home / Self Care | Payer: PPO | Source: Ambulatory Visit | Attending: Surgery | Admitting: Surgery

## 2022-07-16 DIAGNOSIS — I6523 Occlusion and stenosis of bilateral carotid arteries: Secondary | ICD-10-CM | POA: Diagnosis not present

## 2022-07-16 NOTE — Progress Notes (Unsigned)
[facility] [place]  ASSESSMENT / PLAN: Kurt Kidd is a 72 y.o. male with asymptomatic bilateral carotid artery stenosis recent CT angiogram shows critical left carotid artery stenosis.  He remains asymptomatic.  The patient should continue best medical therapy for carotid artery stenosis including: Complete cessation from all tobacco products. Blood glucose control with goal A1c < 7%. Blood pressure control with goal blood pressure < 140/90 mmHg. Lipid reduction therapy with goal LDL-C <100 mg/dL (<16 if symptomatic from carotid artery stenosis).  Aspirin 81mg  PO QD.  Atorvastatin 40-80mg  PO QD (or other "high intensity" statin therapy).  Plan left carotid endarterectomy 06/18/2022.  CHIEF COMPLAINT: asymptomatic carotid stenosis  HISTORY OF PRESENT ILLNESS: Kurt Kidd is a 72 y.o. male referred to clinic to evaluate carotid artery stenosis. Dr. Rennis Golden has been following this for some time. He has no symptoms of stroke or mini-stroke. No facial droop, visual changes, no unilateral weakness. We spent the bulk of the visit reviewing the natural history of carotid artery stenosis. I explained the rationale for intervention in asymptomatic patients at 80%. We reviewed the plan for surveillance.   06/12/22: Returns to discuss CT angiogram results.  I counseled him about the worse than expected carotid stenosis in the left carotid bifurcation.  We reviewed his unique anatomy.  Ultimately I recommended a carotid endarterectomy given the low bifurcation in his neck.  Counseled him about his 1 to 2% risk of perioperative stroke, and risk of cranial nerve injury and hematoma.   Past Medical History:  Diagnosis Date   Blood transfusion without reported diagnosis    CAD (coronary artery disease)    DES.. 2004 /  catheterization 2008, 10% narrowing stent site, pain not cardiac   Colitis, ischemic (HCC)    Colon polyp    Degenerative joint disease    Diabetes mellitus  2012   Diverticulosis    Dyslipidemia    Ejection fraction    65-70%, echo, March, 2012   GERD (gastroesophageal reflux disease)    Hyperlipidemia    Hypertension    Mitral regurgitation    Echo, mild, 2012   Murmur    Onychomycosis    Overweight(278.02)    Palpitations    Rapid heartbeat and shortness of breath after eating 2008 recurrent episode 2012    Past Surgical History:  Procedure Laterality Date   ANGIOPLASTY / STENTING FEMORAL  09/09/2002   CARDIAC CATHETERIZATION     COLONOSCOPY  2014   Dr Lemar Livings   COLONOSCOPY  11/26/2007   Dr Patterson/POLYPOID COLONIC MUCOSA WITH HYPEREMIA AND   COLONOSCOPY WITH PROPOFOL N/A 07/13/2016   Procedure: COLONOSCOPY WITH PROPOFOL;  Surgeon: Earline Mayotte, MD;  Location: [facility];  Service: Endoscopy;  Laterality: N/A;   ENDARTERECTOMY Left 06/18/2022   Procedure: ENDARTERECTOMY CAROTID WITH BOVINE PARICARDIAL PATCH.;  Surgeon: Leonie Douglas, MD;  Location: [facility] OR;  Service: Vascular;  Laterality: Left;   ESOPHAGOGASTRODUODENOSCOPY (EGD) WITH PROPOFOL N/A 07/13/2016   Procedure: ESOPHAGOGASTRODUODENOSCOPY (EGD) WITH PROPOFOL;  Surgeon: Earline Mayotte, MD;  Location: [facility];  Service: Endoscopy;  Laterality: N/A;   TOTAL ANKLE ARTHROPLASTY Left 02/25/2020   Procedure: TOTAL ANKLE ARTHROPLASTY;  Surgeon: Toni Arthurs, MD;  Location: [facility];  Service: Orthopedics;  Laterality: Left;   UPPER GI ENDOSCOPY  11/26/2007   Dr Jarold Motto    Family History  Problem Relation Age of Onset   Heart failure Mother    Heart disease Mother    Diabetes Father  father also had ASHD/ CABG   Heart disease Father    Colon cancer Brother 75   AAA (abdominal aortic aneurysm) Brother 64   Esophageal cancer Neg Hx    Rectal cancer Neg Hx    Stomach cancer Neg Hx     Social History   Socioeconomic History   Marital status: Married    Spouse name: Not on file   Number of children: Not on file   Years of  education: Not on file   Highest education level: Not on file  Occupational History   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    Employer: [employer], [employer]  Tobacco Use   Smoking status: Former    Packs/day: 1.00    Years: 26.00    Additional pack years: 0.00    Total pack years: 26.00    Types: Cigarettes    Quit date: 03/05/1990    Years since quitting: 32.3   Smokeless tobacco: Never  Vaping Use   Vaping Use: Never used  Substance and Sexual Activity   Alcohol use: Yes    Alcohol/week: 2.0 standard drinks of alcohol    Types: 2 Glasses of wine per week    Comment: 2 drinks per week   Drug use: No   Sexual activity: Not on file  Other Topics Concern   Not on file  Social History Narrative   Not on file   Social Determinants of Health   Financial Resource Strain: Low Risk  (03/19/2022)   Overall Financial Resource Strain (CARDIA)    Difficulty of Paying Living Expenses: Not hard at all  Food Insecurity: No Food Insecurity (03/19/2022)   Hunger Vital Sign    Worried About Running Out of Food in the Last Year: Never true    Ran Out of Food in the Last Year: Never true  Transportation Needs: No Transportation Needs (03/19/2022)   PRAPARE - Administrator, Civil Service (Medical): No    Lack of Transportation (Non-Medical): No  Physical Activity: Inactive (03/19/2022)   Exercise Vital Sign    Days of Exercise per Week: 0 days    Minutes of Exercise per Session: 0 min  Stress: No Stress Concern Present (03/19/2022)   [employer] of Occupational Health - Occupational Stress Questionnaire    Feeling of Stress : Not at all  Social Connections: Moderately Isolated (03/15/2021)   Social Connection and Isolation Panel [NHANES]    Frequency of Communication with Friends and Family: Twice a week    Frequency of Social Gatherings with Friends and Family: Twice a week    Attends Religious Services: Never    Database administrator or Organizations: No    Attends Tax inspector Meetings: Never    Marital Status: Married  Catering manager Violence: Not At Risk (03/15/2021)   Humiliation, Afraid, Rape, and Kick questionnaire    Fear of Current or Ex-Partner: No    Emotionally Abused: No    Physically Abused: No    Sexually Abused: No    No Known Allergies  Current Outpatient Medications  Medication Sig Dispense Refill   aspirin EC 81 MG tablet Take 1 tablet (81 mg total) by mouth daily. Swallow whole.     atorvastatin (LIPITOR) 80 MG tablet Take 1 tablet by mouth once daily (Patient taking differently: Take 80 mg by mouth at bedtime.) 90 tablet 0   Blood Glucose Monitoring Suppl (ONE TOUCH ULTRA SYSTEM KIT) W/DEVICE KIT 1 kit by Does not apply route once.  1 each 0   esomeprazole (NEXIUM) 20 MG capsule Take 20 mg by mouth daily before breakfast.     ezetimibe (ZETIA) 10 MG tablet Take 1 tablet by mouth once daily (Patient taking differently: Take 10 mg by mouth at bedtime.) 90 tablet 2   LYSINE PO Take 1,000 mg by mouth 2 (two) times daily as needed (fever blisters).     metFORMIN (GLUCOPHAGE) 500 MG tablet TAKE 1 TABLET BY MOUTH THREE TIMES DAILY 270 tablet 0   metoprolol tartrate (LOPRESSOR) 50 MG tablet Take 1 tablet by mouth twice daily 180 tablet 0   Multiple Vitamins-Minerals (MULTIVITAMIN WITH MINERALS) tablet Take 1 tablet by mouth in the morning.     olive oil (SM SWEET OIL) external oil Apply 1-2 drops topically at bedtime. Applied to a Q-tip and applied into ears nightly     tamsulosin (FLOMAX) 0.4 MG CAPS capsule Take 1 capsule by mouth once daily (Patient taking differently: Take 0.4 mg by mouth at bedtime.) 90 capsule 3   traMADol (ULTRAM) 50 MG tablet Take 1 tablet (50 mg total) by mouth every 6 (six) hours as needed. 8 tablet 0   No current facility-administered medications for this visit.    PHYSICAL EXAM There were no vitals filed for this visit.  Well appearing man in no distress Regular rate and rhythm Unlabored breathing 2+  radial pulses Weakly palpable PT bilaterally Anterior L ankle scar  PERTINENT LABORATORY AND RADIOLOGIC DATA  Most recent CBC    Latest Ref Rng & Units 06/19/2022    3:25 AM 06/18/2022    5:53 AM 03/30/2021    9:18 AM  CBC  WBC 4.0 - 10.5 K/uL 12.5  7.9  7.0   Hemoglobin 13.0 - 17.0 g/dL 16.1  09.6  04.5   Hematocrit 39.0 - 52.0 % 35.8  40.2  42.6   Platelets 150 - 400 K/uL 238  254  299.0      Most recent CMP    Latest Ref Rng & Units 06/19/2022    3:25 AM 06/18/2022    5:53 AM 06/04/2022    1:22 PM  CMP  Glucose 70 - 99 mg/dL 409  811    BUN 8 - 23 mg/dL 15  16    Creatinine 9.14 - 1.24 mg/dL 7.82  9.56  2.13   Sodium 135 - 145 mmol/L 132  140    Potassium 3.5 - 5.1 mmol/L 4.0  4.5    Chloride 98 - 111 mmol/L 98  103    CO2 22 - 32 mmol/L 24  27    Calcium 8.9 - 10.3 mg/dL 8.8  9.5    Total Protein 6.5 - 8.1 g/dL  6.8    Total Bilirubin 0.3 - 1.2 mg/dL  0.8    Alkaline Phos 38 - 126 U/L  74    AST 15 - 41 U/L  30    ALT 0 - 44 U/L  36      Renal function CrCl cannot be calculated (Patient's most recent lab result is older than the maximum 21 days allowed.).  Hgb A1c MFr Bld (%)  Date Value  06/18/2022 6.8 (H)    LDL Cholesterol  Date Value Ref Range Status  06/19/2022 63 0 - 99 mg/dL Final    Comment:           Total Cholesterol/HDL:CHD Risk Coronary Heart Disease Risk Table  Men   Women  1/2 Average Risk   3.4   3.3  Average Risk       5.0   4.4  2 X Average Risk   9.6   7.1  3 X Average Risk  23.4   11.0        Use the calculated Patient Ratio above and the CHD Risk Table to determine the patient's CHD Risk.        ATP III CLASSIFICATION (LDL):  <100     mg/dL   Optimal  161-096  mg/dL   Near or Above                    Optimal  130-159  mg/dL   Borderline  045-409  mg/dL   High  >811     mg/dL   Very High Performed at [facility], [address] [place], [place] [address]    Direct LDL  Date Value Ref Range Status   10/17/2020 82.0 mg/dL Final    Comment:    Optimal:  <100 mg/dLNear or Above Optimal:  100-129 mg/dLBorderline High:  130-159 mg/dLHigh:  160-189 mg/dLVery High:  >190 mg/dL    CT angiogram 1. Critical left ICA origin stenosis due to atheromatous plaque. 2. 50% atheromatous narrowing of the proximal right ICA. 3. Advanced bilateral vertebral origin stenosis due to bulky calcified plaque.  Rande Brunt. Lenell Antu, MD FACS [facility] Phone Number: [phone] 07/16/2022 10:11 AM   Total time spent on preparing this encounter including chart review, data review, collecting history, examining the patient, coordinating care for this new patient, 60 minutes.  Portions of this report may have been transcribed using voice recognition software.  Every effort has been made to ensure accuracy; however, inadvertent computerized transcription errors may still be present.

## 2022-07-17 ENCOUNTER — Encounter: Payer: Self-pay | Admitting: Vascular Surgery

## 2022-07-17 ENCOUNTER — Ambulatory Visit (INDEPENDENT_AMBULATORY_CARE_PROVIDER_SITE_OTHER): Payer: PPO | Admitting: Vascular Surgery

## 2022-07-17 VITALS — BP 157/80 | HR 65 | Temp 98.2°F | Resp 20 | Ht 66.0 in | Wt 189.0 lb

## 2022-07-17 DIAGNOSIS — Z9889 Other specified postprocedural states: Secondary | ICD-10-CM

## 2022-07-20 ENCOUNTER — Other Ambulatory Visit: Payer: Self-pay | Admitting: Family Medicine

## 2022-07-20 DIAGNOSIS — I6523 Occlusion and stenosis of bilateral carotid arteries: Secondary | ICD-10-CM

## 2022-07-20 DIAGNOSIS — E782 Mixed hyperlipidemia: Secondary | ICD-10-CM

## 2022-07-26 ENCOUNTER — Other Ambulatory Visit: Payer: Self-pay

## 2022-07-26 DIAGNOSIS — I6523 Occlusion and stenosis of bilateral carotid arteries: Secondary | ICD-10-CM

## 2022-07-30 ENCOUNTER — Other Ambulatory Visit: Payer: Self-pay | Admitting: Family Medicine

## 2022-07-30 DIAGNOSIS — N401 Enlarged prostate with lower urinary tract symptoms: Secondary | ICD-10-CM

## 2022-08-26 ENCOUNTER — Other Ambulatory Visit: Payer: Self-pay | Admitting: Family Medicine

## 2022-08-26 DIAGNOSIS — E782 Mixed hyperlipidemia: Secondary | ICD-10-CM

## 2022-10-17 ENCOUNTER — Other Ambulatory Visit: Payer: Self-pay | Admitting: Family Medicine

## 2022-10-17 DIAGNOSIS — I10 Essential (primary) hypertension: Secondary | ICD-10-CM

## 2022-10-18 ENCOUNTER — Other Ambulatory Visit: Payer: Self-pay | Admitting: Family Medicine

## 2022-10-18 DIAGNOSIS — I6523 Occlusion and stenosis of bilateral carotid arteries: Secondary | ICD-10-CM

## 2022-10-18 DIAGNOSIS — E782 Mixed hyperlipidemia: Secondary | ICD-10-CM

## 2022-10-29 ENCOUNTER — Ambulatory Visit (INDEPENDENT_AMBULATORY_CARE_PROVIDER_SITE_OTHER): Payer: PPO | Admitting: Family Medicine

## 2022-10-29 ENCOUNTER — Encounter: Payer: Self-pay | Admitting: Family Medicine

## 2022-10-29 VITALS — BP 130/64 | HR 69 | Temp 98.7°F | Ht 66.0 in | Wt 191.6 lb

## 2022-10-29 DIAGNOSIS — Z0000 Encounter for general adult medical examination without abnormal findings: Secondary | ICD-10-CM

## 2022-10-29 DIAGNOSIS — R3911 Hesitancy of micturition: Secondary | ICD-10-CM | POA: Diagnosis not present

## 2022-10-29 DIAGNOSIS — I10 Essential (primary) hypertension: Secondary | ICD-10-CM

## 2022-10-29 DIAGNOSIS — E782 Mixed hyperlipidemia: Secondary | ICD-10-CM

## 2022-10-29 DIAGNOSIS — Z7984 Long term (current) use of oral hypoglycemic drugs: Secondary | ICD-10-CM | POA: Diagnosis not present

## 2022-10-29 DIAGNOSIS — L570 Actinic keratosis: Secondary | ICD-10-CM

## 2022-10-29 DIAGNOSIS — N401 Enlarged prostate with lower urinary tract symptoms: Secondary | ICD-10-CM

## 2022-10-29 DIAGNOSIS — E119 Type 2 diabetes mellitus without complications: Secondary | ICD-10-CM

## 2022-10-29 LAB — URINALYSIS, ROUTINE W REFLEX MICROSCOPIC
Bilirubin Urine: NEGATIVE
Hgb urine dipstick: NEGATIVE
Ketones, ur: NEGATIVE
Leukocytes,Ua: NEGATIVE
Nitrite: NEGATIVE
RBC / HPF: NONE SEEN (ref 0–?)
Specific Gravity, Urine: 1.015 (ref 1.000–1.030)
Total Protein, Urine: NEGATIVE
Urine Glucose: NEGATIVE
Urobilinogen, UA: 0.2 (ref 0.0–1.0)
WBC, UA: NONE SEEN (ref 0–?)
pH: 6.5 (ref 5.0–8.0)

## 2022-10-29 LAB — COMPREHENSIVE METABOLIC PANEL
ALT: 27 U/L (ref 0–53)
AST: 22 U/L (ref 0–37)
Albumin: 4.3 g/dL (ref 3.5–5.2)
Alkaline Phosphatase: 72 U/L (ref 39–117)
BUN: 13 mg/dL (ref 6–23)
CO2: 28 mEq/L (ref 19–32)
Calcium: 9.4 mg/dL (ref 8.4–10.5)
Chloride: 99 mEq/L (ref 96–112)
Creatinine, Ser: 0.71 mg/dL (ref 0.40–1.50)
GFR: 90.74 mL/min (ref >60.00)
Glucose, Bld: 139 mg/dL — ABNORMAL HIGH (ref 70–99)
Potassium: 4.4 mEq/L (ref 3.5–5.1)
Sodium: 137 mEq/L (ref 135–145)
Total Bilirubin: 0.5 mg/dL (ref 0.2–1.2)
Total Protein: 6.5 g/dL (ref 6.0–8.3)

## 2022-10-29 LAB — CBC
HCT: 40.7 % (ref 39.0–52.0)
Hemoglobin: 13.6 g/dL (ref 13.0–17.0)
MCHC: 33.4 g/dL (ref 30.0–36.0)
MCV: 86.3 fl (ref 78.0–100.0)
Platelets: 288 10*3/uL (ref 150.0–400.0)
RBC: 4.71 Mil/uL (ref 4.22–5.81)
RDW: 14.5 % (ref 11.5–15.5)
WBC: 7.2 10*3/uL (ref 4.0–10.5)

## 2022-10-29 LAB — MICROALBUMIN / CREATININE URINE RATIO
Creatinine,U: 94.5 mg/dL
Microalb Creat Ratio: 6.7 mg/g (ref 0.0–30.0)
Microalb, Ur: 6.3 mg/dL — ABNORMAL HIGH (ref 0.0–1.9)

## 2022-10-29 LAB — HEMOGLOBIN A1C: Hgb A1c MFr Bld: 7.2 % — ABNORMAL HIGH (ref 4.6–6.5)

## 2022-10-29 LAB — PSA: PSA: 2.3 ng/mL (ref 0.10–4.00)

## 2022-10-29 MED ORDER — ATORVASTATIN CALCIUM 80 MG PO TABS: 80.0000 mg | ORAL_TABLET | Freq: Every day | ORAL | 3 refills | Status: DC

## 2022-10-29 MED ORDER — METFORMIN HCL 500 MG PO TABS: 500.0000 mg | ORAL_TABLET | Freq: Three times a day (TID) | ORAL | 1 refills | Status: DC

## 2022-10-29 MED ORDER — METOPROLOL TARTRATE 50 MG PO TABS
50.0000 mg | ORAL_TABLET | Freq: Two times a day (BID) | ORAL | 1 refills | Status: DC
Start: 2022-10-29 — End: 2023-06-24

## 2022-10-29 MED ORDER — METFORMIN HCL ER 750 MG PO TB24
750.0000 mg | ORAL_TABLET | Freq: Two times a day (BID) | ORAL | 3 refills | Status: DC
Start: 2022-10-29 — End: 2023-11-01

## 2022-10-29 MED ORDER — TAMSULOSIN HCL 0.4 MG PO CAPS
ORAL_CAPSULE | ORAL | 3 refills | Status: DC
Start: 2022-10-29 — End: 2023-11-06

## 2022-10-29 NOTE — Progress Notes (Addendum)
[facility]  Established Patient Office Visit   Subjective:  Patient ID: Kurt Kidd, male    DOB: 02-22-1951  Age: 72 y.o. MRN: [id number]  Chief Complaint  Patient presents with   Hypertension    Hypertension Pertinent negatives include no blurred vision.   Encounter Diagnoses  Name Primary?   Healthcare maintenance Yes   Mixed hyperlipidemia    Essential hypertension    Benign prostatic hyperplasia with urinary hesitancy    Diabetes mellitus type 2, noninsulin dependent (HCC)    Actinic keratosis    Here for physical and follow-up of above.  Lost to follow-up now for few years.  Status post recent carotid endarterectomy.  Has done well.  Review of the chart reveals well-controlled lipids on current regimen.  Continues metformin for type 2 diabetes and tamsulosin for BPH.  Symptoms are well-controlled.  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  He does have regular dental care.  Health maintenance is up-to-date.   Review of Systems  Constitutional: Negative.   HENT: Negative.    Eyes:  Negative for blurred vision, discharge and redness.  Respiratory: Negative.    Cardiovascular: Negative.   Gastrointestinal:  Negative for abdominal pain.  Genitourinary: Negative.   Musculoskeletal: Negative.  Negative for myalgias.  Skin:  Negative for rash.  Neurological:  Negative for tingling, loss of consciousness and weakness.  Endo/Heme/Allergies:  Negative for polydipsia.     Current Outpatient Medications:    metFORMIN (GLUCOPHAGE-XR) 750 MG 24 hr tablet, Take 1 tablet (750 mg total) by mouth 2 (two) times daily before a meal., Disp: 60 tablet, Rfl: 3   aspirin EC 81 MG tablet, Take 1 tablet (81 mg total) by mouth daily. Swallow whole., Disp: , Rfl:    atorvastatin (LIPITOR) 80 MG tablet, Take 1 tablet (80 mg total) by mouth daily., Disp: 90 tablet, Rfl: 3   Blood Glucose Monitoring Suppl (ONE TOUCH ULTRA SYSTEM KIT) W/DEVICE KIT, 1 kit by Does not apply route once., Disp: 1 each, Rfl: 0    esomeprazole (NEXIUM) 20 MG capsule, Take 20 mg by mouth daily before breakfast., Disp: , Rfl:    ezetimibe (ZETIA) 10 MG tablet, Take 1 tablet by mouth once daily, Disp: 90 tablet, Rfl: 0   LYSINE PO, Take 1,000 mg by mouth 2 (two) times daily as needed (fever blisters)., Disp: , Rfl:    metoprolol tartrate (LOPRESSOR) 50 MG tablet, Take 1 tablet (50 mg total) by mouth 2 (two) times daily., Disp: 180 tablet, Rfl: 1   Multiple Vitamins-Minerals (MULTIVITAMIN WITH MINERALS) tablet, Take 1 tablet by mouth in the morning., Disp: , Rfl:    olive oil (SM SWEET OIL) external oil, Apply 1-2 drops topically at bedtime. Applied to a Q-tip and applied into ears nightly, Disp: , Rfl:    tamsulosin (FLOMAX) 0.4 MG CAPS capsule, Take 1 capsule by mouth once daily, Disp: 90 capsule, Rfl: 3   Objective:     BP 130/64   Pulse 69   Temp 98.7 F (37.1 C)   Ht 5\' 6"  (1.676 m)   Wt 191 lb 9.6 oz (86.9 kg)   SpO2 94%   BMI 30.93 kg/m    Physical Exam Constitutional:      General: He is not in acute distress.    Appearance: Normal appearance. He is not ill-appearing, toxic-appearing or diaphoretic.  HENT:     Head: Normocephalic and atraumatic.     Right Ear: External ear normal.     Left Ear:  External ear normal.     Mouth/Throat:     Mouth: Mucous membranes are moist.     Pharynx: Oropharynx is clear. No oropharyngeal exudate or posterior oropharyngeal erythema.  Eyes:     General: No scleral icterus.       Right eye: No discharge.        Left eye: No discharge.     Extraocular Movements: Extraocular movements intact.     Conjunctiva/sclera: Conjunctivae normal.     Pupils: Pupils are equal, round, and reactive to light.  Cardiovascular:     Rate and Rhythm: Normal rate and regular rhythm.     Heart sounds: Murmur heard.  Pulmonary:     Effort: Pulmonary effort is normal. No respiratory distress.     Breath sounds: Normal breath sounds.  Abdominal:     General: Bowel sounds are normal.      Tenderness: There is no abdominal tenderness. There is no guarding.  Musculoskeletal:     Cervical back: No rigidity or tenderness.  Skin:    General: Skin is warm and dry.       Neurological:     Mental Status: He is alert and oriented to person, place, and time.  Psychiatric:        Mood and Affect: Mood normal.        Behavior: Behavior normal.      Results for orders placed or performed in visit on 10/29/22  CBC  Result Value Ref Range   WBC 7.2 4.0 - 10.5 K/uL   RBC 4.71 4.22 - 5.81 Mil/uL   Platelets 288.0 150.0 - 400.0 K/uL   Hemoglobin 13.6 13.0 - 17.0 g/dL   HCT 86.5 78.4 - 69.6 %   MCV 86.3 78.0 - 100.0 fl   MCHC 33.4 30.0 - 36.0 g/dL   RDW 29.5 28.4 - 13.2 %  Hemoglobin A1c  Result Value Ref Range   Hgb A1c MFr Bld 7.2 (H) 4.6 - 6.5 %  PSA  Result Value Ref Range   PSA 2.30 0.10 - 4.00 ng/mL  Urinalysis, Routine w reflex microscopic  Result Value Ref Range   Color, Urine YELLOW Yellow;Lt. Yellow;Straw;Dark Yellow;Amber;Green;Red;Brown   APPearance CLEAR Clear;Turbid;Slightly Cloudy;Cloudy   Specific Gravity, Urine 1.015 1.000 - 1.030   pH 6.5 5.0 - 8.0   Total Protein, Urine NEGATIVE Negative   Urine Glucose NEGATIVE Negative   Ketones, ur NEGATIVE Negative   Bilirubin Urine NEGATIVE Negative   Hgb urine dipstick NEGATIVE Negative   Urobilinogen, UA 0.2 0.0 - 1.0   Leukocytes,Ua NEGATIVE Negative   Nitrite NEGATIVE Negative   WBC, UA none seen 0-2/hpf   RBC / HPF none seen 0-2/hpf   Squamous Epithelial / HPF Rare(0-4/hpf) Rare(0-4/hpf)  Microalbumin / creatinine urine ratio  Result Value Ref Range   Microalb, Ur 6.3 (H) 0.0 - 1.9 mg/dL   Creatinine,U 44.0 mg/dL   Microalb Creat Ratio 6.7 0.0 - 30.0 mg/g  Comprehensive metabolic panel  Result Value Ref Range   Sodium 137 135 - 145 mEq/L   Potassium 4.4 3.5 - 5.1 mEq/L   Chloride 99 96 - 112 mEq/L   CO2 28 19 - 32 mEq/L   Glucose, Bld 139 (H) 70 - 99 mg/dL   BUN 13 6 - 23 mg/dL   Creatinine,  Ser 1.02 0.40 - 1.50 mg/dL   Total Bilirubin 0.5 0.2 - 1.2 mg/dL   Alkaline Phosphatase 72 39 - 117 U/L   AST 22 0 - 37 U/L  ALT 27 0 - 53 U/L   Total Protein 6.5 6.0 - 8.3 g/dL   Albumin 4.3 3.5 - 5.2 g/dL   GFR 62.37 >62.83 mL/min   Calcium 9.4 8.4 - 10.5 mg/dL      The ASCVD Risk score (Arnett DK, et al., 2019) failed to calculate for the following reasons:   The valid total cholesterol range is 130 to 320 mg/dL    Assessment & Plan:   Healthcare maintenance  Mixed hyperlipidemia -     Atorvastatin Calcium; Take 1 tablet (80 mg total) by mouth daily.  Dispense: 90 tablet; Refill: 3  Essential hypertension -     Metoprolol Tartrate; Take 1 tablet (50 mg total) by mouth 2 (two) times daily.  Dispense: 180 tablet; Refill: 1 -     CBC -     Comprehensive metabolic panel  Benign prostatic hyperplasia with urinary hesitancy -     Tamsulosin HCl; Take 1 capsule by mouth once daily  Dispense: 90 capsule; Refill: 3 -     PSA  Diabetes mellitus type 2, noninsulin dependent (HCC) -     Hemoglobin A1c -     Urinalysis, Routine w reflex microscopic -     Microalbumin / creatinine urine ratio -     Comprehensive metabolic panel -     metFORMIN HCl ER; Take 1 tablet (750 mg total) by mouth 2 (two) times daily before a meal.  Dispense: 60 tablet; Refill: 3  Actinic keratosis -     Ambulatory referral to Dermatology    Return in about 6 months (around 05/01/2023).  Encouraged healthy active lifestyle.  Information given on health maintenance and disease prevention.  Adjustments made to medication schedule pending results of today's labs.  Mliss Sax, MD

## 2022-10-29 NOTE — Addendum Note (Signed)
Addended by: Andrez Grime on: 10/29/2022 04:13 PM   Modules accepted: Orders

## 2022-11-06 ENCOUNTER — Encounter (HOSPITAL_COMMUNITY): Payer: PPO

## 2022-11-06 ENCOUNTER — Other Ambulatory Visit (HOSPITAL_COMMUNITY): Payer: PPO

## 2022-11-06 ENCOUNTER — Ambulatory Visit: Payer: PPO | Admitting: Vascular Surgery

## 2023-02-24 ENCOUNTER — Other Ambulatory Visit: Payer: Self-pay | Admitting: Family Medicine

## 2023-02-24 DIAGNOSIS — E782 Mixed hyperlipidemia: Secondary | ICD-10-CM

## 2023-02-24 DIAGNOSIS — I6523 Occlusion and stenosis of bilateral carotid arteries: Secondary | ICD-10-CM

## 2023-03-03 DIAGNOSIS — J410 Simple chronic bronchitis: Secondary | ICD-10-CM | POA: Diagnosis not present

## 2023-03-03 DIAGNOSIS — R070 Pain in throat: Secondary | ICD-10-CM | POA: Diagnosis not present

## 2023-03-03 DIAGNOSIS — R051 Acute cough: Secondary | ICD-10-CM | POA: Diagnosis not present

## 2023-03-03 DIAGNOSIS — R0981 Nasal congestion: Secondary | ICD-10-CM | POA: Diagnosis not present

## 2023-03-03 DIAGNOSIS — R509 Fever, unspecified: Secondary | ICD-10-CM | POA: Diagnosis not present

## 2023-03-18 DIAGNOSIS — D0461 Carcinoma in situ of skin of right upper limb, including shoulder: Secondary | ICD-10-CM | POA: Diagnosis not present

## 2023-03-18 DIAGNOSIS — D492 Neoplasm of unspecified behavior of bone, soft tissue, and skin: Secondary | ICD-10-CM | POA: Diagnosis not present

## 2023-03-25 ENCOUNTER — Ambulatory Visit (INDEPENDENT_AMBULATORY_CARE_PROVIDER_SITE_OTHER): Payer: PPO

## 2023-03-25 VITALS — BP 138/70 | HR 83 | Temp 98.0°F | Ht 66.5 in | Wt 185.8 lb

## 2023-03-25 DIAGNOSIS — Z0000 Encounter for general adult medical examination without abnormal findings: Secondary | ICD-10-CM

## 2023-03-25 NOTE — Progress Notes (Signed)
Subjective:   Kurt Kidd is a 73 y.o. male who presents for Medicare Annual/Subsequent preventive examination.  Visit Complete: In person    Cardiac Risk Factors include: advanced age (>76men, >70 women);diabetes mellitus;dyslipidemia;hypertension;male gender     Objective:    Today's Vitals   03/25/23 0809  BP: 138/70  Pulse: 83  Temp: 98 F (36.7 C)  TempSrc: Oral  SpO2: 95%  Weight: 185 lb 12.8 oz (84.3 kg)  Height: 5' 6.5" (1.689 m)   Body mass index is 29.54 kg/m.     03/25/2023    8:16 AM 06/18/2022    6:13 AM 03/19/2022    8:20 AM 03/15/2021    8:32 AM 02/25/2020    6:48 AM 02/19/2020    1:31 PM 10/07/2019   10:36 AM  Advanced Directives  Does Patient Have a Medical Advance Directive? No No No No No No No  Would patient like information on creating a medical advance directive? No - Patient declined No - Patient declined No - Patient declined No - Patient declined No - Patient declined No - Patient declined No - Patient declined    Current Medications (verified) Outpatient Encounter Medications as of 03/25/2023  Medication Sig   aspirin EC 81 MG tablet Take 1 tablet (81 mg total) by mouth daily. Swallow whole.   atorvastatin (LIPITOR) 80 MG tablet Take 1 tablet (80 mg total) by mouth daily.   Blood Glucose Monitoring Suppl (ONE TOUCH ULTRA SYSTEM KIT) W/DEVICE KIT 1 kit by Does not apply route once.   esomeprazole (NEXIUM) 20 MG capsule Take 20 mg by mouth daily before breakfast.   ezetimibe (ZETIA) 10 MG tablet Take 1 tablet by mouth once daily   LYSINE PO Take 1,000 mg by mouth 2 (two) times daily as needed (fever blisters).   metFORMIN (GLUCOPHAGE-XR) 750 MG 24 hr tablet Take 1 tablet (750 mg total) by mouth 2 (two) times daily before a meal.   metoprolol tartrate (LOPRESSOR) 50 MG tablet Take 1 tablet (50 mg total) by mouth 2 (two) times daily.   Multiple Vitamins-Minerals (MULTIVITAMIN WITH MINERALS) tablet Take 1 tablet by mouth in the morning.    olive oil (SM SWEET OIL) external oil Apply 1-2 drops topically at bedtime. Applied to a Q-tip and applied into ears nightly   tamsulosin (FLOMAX) 0.4 MG CAPS capsule Take 1 capsule by mouth once daily   No facility-administered encounter medications on file as of 03/25/2023.    Allergies (verified) Patient has no known allergies.   History: Past Medical History:  Diagnosis Date   Blood transfusion without reported diagnosis    CAD (coronary artery disease)    DES.. 2004 /  catheterization 2008, 10% narrowing stent site, pain not cardiac   Colitis, ischemic (HCC)    Colon polyp    Degenerative joint disease    Diabetes mellitus 2012   Diverticulosis    Dyslipidemia    Ejection fraction    65-70%, echo, March, 2012   GERD (gastroesophageal reflux disease)    Hyperlipidemia    Hypertension    Mitral regurgitation    Echo, mild, 2012   Murmur    Onychomycosis    Overweight(278.02)    Palpitations    Rapid heartbeat and shortness of breath after eating 2008 recurrent episode 2012   Past Surgical History:  Procedure Laterality Date   ANGIOPLASTY / STENTING FEMORAL  09/09/2002   CARDIAC CATHETERIZATION     COLONOSCOPY  2014   Dr Lemar Livings  COLONOSCOPY  11/26/2007   Dr Patterson/POLYPOID COLONIC MUCOSA WITH HYPEREMIA AND   COLONOSCOPY WITH PROPOFOL N/A 07/13/2016   Procedure: COLONOSCOPY WITH PROPOFOL;  Surgeon: Earline Mayotte, MD;  Location: [facility] ENDOSCOPY;  Service: Endoscopy;  Laterality: N/A;   ENDARTERECTOMY Left 06/18/2022   Procedure: ENDARTERECTOMY CAROTID WITH BOVINE PARICARDIAL PATCH.;  Surgeon: Leonie Douglas, MD;  Location: [facility] OR;  Service: Vascular;  Laterality: Left;   ESOPHAGOGASTRODUODENOSCOPY (EGD) WITH PROPOFOL N/A 07/13/2016   Procedure: ESOPHAGOGASTRODUODENOSCOPY (EGD) WITH PROPOFOL;  Surgeon: Earline Mayotte, MD;  Location: [facility] ENDOSCOPY;  Service: Endoscopy;  Laterality: N/A;   TOTAL ANKLE ARTHROPLASTY Left 02/25/2020   Procedure: TOTAL ANKLE  ARTHROPLASTY;  Surgeon: Toni Arthurs, MD;  Location: [facility];  Service: Orthopedics;  Laterality: Left;   UPPER GI ENDOSCOPY  11/26/2007   Dr Jarold Motto   Family History  Problem Relation Age of Onset   Heart failure Mother    Heart disease Mother    Diabetes Father        father also had ASHD/ CABG   Heart disease Father    Colon cancer Brother 64   AAA (abdominal aortic aneurysm) Brother 74   Esophageal cancer Neg Hx    Rectal cancer Neg Hx    Stomach cancer Neg Hx    Social History   Socioeconomic History   Marital status: Married    Spouse name: Not on file   Number of children: Not on file   Years of education: Not on file   Highest education level: Not on file  Occupational History   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    Employer: [employer], [employer]  Tobacco Use   Smoking status: Former    Current packs/day: 0.00    Average packs/day: 1 pack/day for 26.0 years (26.0 ttl pk-yrs)    Types: Cigarettes    Start date: 03/05/1964    Quit date: 03/05/1990    Years since quitting: 33.0   Smokeless tobacco: Never  Vaping Use   Vaping status: Never Used  Substance and Sexual Activity   Alcohol use: Yes    Alcohol/week: 2.0 standard drinks of alcohol    Types: 2 Glasses of wine per week    Comment: 2 drinks per week   Drug use: No   Sexual activity: Not on file  Other Topics Concern   Not on file  Social History Narrative   Not on file   Social Drivers of Health   Financial Resource Strain: Low Risk  (03/25/2023)   Overall Financial Resource Strain (CARDIA)    Difficulty of Paying Living Expenses: Not hard at all  Food Insecurity: No Food Insecurity (03/25/2023)   Hunger Vital Sign    Worried About Running Out of Food in the Last Year: Never true    Ran Out of Food in the Last Year: Never true  Transportation Needs: No Transportation Needs (03/25/2023)   PRAPARE - Administrator, Civil Service (Medical): No    Lack of Transportation  (Non-Medical): No  Physical Activity: Inactive (03/25/2023)   Exercise Vital Sign    Days of Exercise per Week: 0 days    Minutes of Exercise per Session: 0 min  Stress: No Stress Concern Present (03/25/2023)   [employer] of Occupational Health - Occupational Stress Questionnaire    Feeling of Stress : Not at all  Social Connections: Moderately Isolated (03/25/2023)   Social Connection and Isolation Panel [NHANES]    Frequency of Communication with Friends and  Family: More than three times a week    Frequency of Social Gatherings with Friends and Family: More than three times a week    Attends Religious Services: Never    Database administrator or Organizations: No    Attends Engineer, structural: Never    Marital Status: Married    Tobacco Counseling Counseling given: Not Answered   Clinical Intake:  Pre-visit preparation completed: Yes  Pain : No/denies pain     Nutritional Status: BMI 25 -29 Overweight Nutritional Risks: None Diabetes: Yes CBG done?: No Did pt. bring in CBG monitor from home?: No  How often do you need to have someone help you when you read instructions, pamphlets, or other written materials from your doctor or pharmacy?: 1 - Never  Interpreter Needed?: No  Information entered by :: NAllen LPN   Activities of Daily Living    03/25/2023    8:10 AM 06/18/2022    6:18 AM  In your present state of health, do you have any difficulty performing the following activities:  Hearing? 0   Vision? 0   Difficulty concentrating or making decisions? 0   Walking or climbing stairs? 0   Dressing or bathing? 0   Doing errands, shopping? 0 0  Preparing Food and eating ? N   Using the Toilet? N   In the past six months, have you accidently leaked urine? Y   Do you have problems with loss of bowel control? N   Managing your Medications? N   Managing your Finances? N   Housekeeping or managing your Housekeeping? N     Patient Care  Team: Mliss Sax, MD as PCP - General (Family Medicine) Rennis Golden Lisette Abu, MD as PCP - Cardiology (Cardiology) Michele Mcalpine, MD as Consulting Physician (Pulmonary Disease) Lemar Livings Merrily Pew, MD (General Surgery)  Indicate any recent Medical Services you may have received from other than [facility] providers in the past year (date may be approximate).     Assessment:   This is a routine wellness examination for Kurt Kidd.  Hearing/Vision screen Hearing Screening - Comments:: Denies hearing issues Vision Screening - Comments:: Regular eye exams, [facility]   Goals Addressed             This Visit's Progress    Patient Stated       03/25/2023, try not to gain weight       Depression Screen    03/25/2023    8:17 AM 10/29/2022    8:37 AM 03/19/2022    8:21 AM 12/07/2021   11:08 AM 07/03/2021    8:17 AM 03/30/2021    8:19 AM 03/15/2021    8:34 AM  PHQ 2/9 Scores  PHQ - 2 Score 0 0 0 0 0 0 0    Fall Risk    03/25/2023    8:16 AM 10/29/2022    8:36 AM 03/19/2022    8:20 AM 12/07/2021   11:08 AM 07/03/2021    8:17 AM  Fall Risk   Falls in the past year? 0 0 0 0 0  Number falls in past yr: 0 0 0 0 0  Injury with Fall? 0 0 0    Risk for fall due to : Medication side effect History of fall(s) Medication side effect    Follow up Falls prevention discussed;Falls evaluation completed Falls evaluation completed Falls prevention discussed;Education provided;Falls evaluation completed      MEDICARE RISK AT HOME: Medicare Risk at Home Any stairs  in or around the home?: Yes If so, are there any without handrails?: No Home free of loose throw rugs in walkways, pet beds, electrical cords, etc?: Yes Adequate lighting in your home to reduce risk of falls?: Yes Life alert?: No Use of a cane, walker or w/c?: No Grab bars in the bathroom?: Yes Shower chair or bench in shower?: No Elevated toilet seat or a handicapped toilet?: Yes  TIMED UP AND GO:  Was the test performed?   Yes  Length of time to ambulate 10 feet: 5 sec Gait steady and fast without use of assistive device    Cognitive Function:        03/25/2023    8:17 AM 03/19/2022    8:22 AM  6CIT Screen  What Year? 0 points 0 points  What month? 0 points 0 points  What time? 0 points 0 points  Count back from 20 0 points 0 points  Months in reverse 4 points 0 points  Repeat phrase 8 points 4 points  Total Score 12 points 4 points    Immunizations Immunization History  Administered Date(s) Administered   Fluad Quad(high Dose 65+) 02/16/2019, 03/30/2021   Influenza Split 12/26/2011   Influenza Whole 12/03/2008   Influenza, High Dose Seasonal PF 11/21/2015, 11/26/2016, 12/02/2017, 12/07/2021   Influenza,inj,Quad PF,6+ Mos 12/29/2012, 11/23/2013, 11/16/2014   Influenza-Unspecified 12/11/2022   PFIZER(Purple Top)SARS-COV-2 Vaccination 05/04/2019, 05/25/2019   PNEUMOCOCCAL CONJUGATE-20 03/30/2021   Pneumococcal Conjugate-13 05/27/2017   Tdap 05/11/2014   Zoster Recombinant(Shingrix) 12/11/2022    TDAP status: Up to date  Flu Vaccine status: Up to date  Pneumococcal vaccine status: Up to date  Covid-19 vaccine status: Information provided on how to obtain vaccines.   Qualifies for Shingles Vaccine? Yes   Zostavax completed No   Shingrix Completed?: needs second dose  Screening Tests Health Maintenance  Topic Date Due   FOOT EXAM  07/04/2022   Zoster Vaccines- Shingrix (2 of 2) 02/05/2023   HEMOGLOBIN A1C  05/01/2023   OPHTHALMOLOGY EXAM  05/22/2023   Diabetic kidney evaluation - eGFR measurement  10/29/2023   Diabetic kidney evaluation - Urine ACR  10/29/2023   Medicare Annual Wellness (AWV)  03/24/2024   DTaP/Tdap/Td (2 - Td or Tdap) 05/10/2024   Colonoscopy  06/28/2026   Pneumonia Vaccine 35+ Years old  Completed   INFLUENZA VACCINE  Completed   Hepatitis C Screening  Completed   HPV VACCINES  Aged Out   COVID-19 Vaccine  Discontinued    Health Maintenance  Health  Maintenance Due  Topic Date Due   FOOT EXAM  07/04/2022   Zoster Vaccines- Shingrix (2 of 2) 02/05/2023    Colorectal cancer screening: Type of screening: Colonoscopy. Completed 06/27/2021. Repeat every 5 years  Lung Cancer Screening: (Low Dose CT Chest recommended if Age 56-80 years, 20 pack-year currently smoking OR have quit w/in 15years.) does not qualify.   Lung Cancer Screening Referral: no  Additional Screening:  Hepatitis C Screening: does qualify; Completed 03/30/2021  Vision Screening: Recommended annual ophthalmology exams for early detection of glaucoma and other disorders of the eye. Is the patient up to date with their annual eye exam?  Yes  Who is the provider or what is the name of the office in which the patient attends annual eye exams? [facility] If pt is not established with a provider, would they like to be referred to a provider to establish care? No .   Dental Screening: Recommended annual dental exams for proper oral hygiene  Diabetic Foot Exam: Diabetic Foot Exam: Overdue, Pt has been advised about the importance in completing this exam. Pt is scheduled for diabetic foot exam on next appointment.  Community Resource Referral / Chronic Care Management: CRR required this visit?  No   CCM required this visit?  No     Plan:     I have personally reviewed and noted the following in the patient's chart:   Medical and social history Use of alcohol, tobacco or illicit drugs  Current medications and supplements including opioid prescriptions. Patient is not currently taking opioid prescriptions. Functional ability and status Nutritional status Physical activity Advanced directives List of other physicians Hospitalizations, surgeries, and ER visits in previous 12 months Vitals Screenings to include cognitive, depression, and falls Referrals and appointments  In addition, I have reviewed and discussed with patient certain preventive protocols,  quality metrics, and best practice recommendations. A written personalized care plan for preventive services as well as general preventive health recommendations were provided to patient.     Barb Merino, LPN   1/61/0960   After Visit Summary: (In Person-Printed) AVS printed and given to the patient  Nurse Notes: none

## 2023-03-25 NOTE — Patient Instructions (Signed)
Kurt Kidd , Thank you for taking time to come for your Medicare Wellness Visit. I appreciate your ongoing commitment to your health goals. Please review the following plan we discussed and let me know if I can assist you in the future.   Referrals/Orders/Follow-Ups/Clinician Recommendations: none  This is a list of the screening recommended for you and due dates:  Health Maintenance  Topic Date Due   Complete foot exam   07/04/2022   Zoster (Shingles) Vaccine (2 of 2) 02/05/2023   Hemoglobin A1C  05/01/2023   Eye exam for diabetics  05/22/2023   Yearly kidney function blood test for diabetes  10/29/2023   Yearly kidney health urinalysis for diabetes  10/29/2023   Medicare Annual Wellness Visit  03/24/2024   DTaP/Tdap/Td vaccine (2 - Td or Tdap) 05/10/2024   Colon Cancer Screening  06/28/2026   Pneumonia Vaccine  Completed   Flu Shot  Completed   Hepatitis C Screening  Completed   HPV Vaccine  Aged Out   COVID-19 Vaccine  Discontinued    Advanced directives: (Declined) Advance directive discussed with you today. Even though you declined this today, please call our office should you change your mind, and we can give you the proper paperwork for you to fill out.  Next Medicare Annual Wellness Visit scheduled for next year: Yes  insert Preventive Care attachment Insert FALL PREVENTION attachment if needed

## 2023-03-27 ENCOUNTER — Other Ambulatory Visit: Payer: Self-pay

## 2023-03-27 DIAGNOSIS — I6523 Occlusion and stenosis of bilateral carotid arteries: Secondary | ICD-10-CM

## 2023-03-30 DIAGNOSIS — N3091 Cystitis, unspecified with hematuria: Secondary | ICD-10-CM | POA: Diagnosis not present

## 2023-03-30 DIAGNOSIS — R300 Dysuria: Secondary | ICD-10-CM | POA: Diagnosis not present

## 2023-04-08 ENCOUNTER — Encounter: Payer: Self-pay | Admitting: Family Medicine

## 2023-04-08 ENCOUNTER — Ambulatory Visit: Payer: PPO | Admitting: Family Medicine

## 2023-04-08 VITALS — BP 136/78 | HR 71 | Temp 98.0°F | Ht 66.0 in | Wt 187.8 lb

## 2023-04-08 DIAGNOSIS — Z7984 Long term (current) use of oral hypoglycemic drugs: Secondary | ICD-10-CM | POA: Diagnosis not present

## 2023-04-08 DIAGNOSIS — Z87898 Personal history of other specified conditions: Secondary | ICD-10-CM | POA: Diagnosis not present

## 2023-04-08 DIAGNOSIS — E119 Type 2 diabetes mellitus without complications: Secondary | ICD-10-CM | POA: Diagnosis not present

## 2023-04-08 DIAGNOSIS — I10 Essential (primary) hypertension: Secondary | ICD-10-CM | POA: Diagnosis not present

## 2023-04-08 DIAGNOSIS — E782 Mixed hyperlipidemia: Secondary | ICD-10-CM

## 2023-04-08 DIAGNOSIS — B002 Herpesviral gingivostomatitis and pharyngotonsillitis: Secondary | ICD-10-CM | POA: Diagnosis not present

## 2023-04-08 LAB — URINALYSIS, ROUTINE W REFLEX MICROSCOPIC
Bilirubin Urine: NEGATIVE
Hgb urine dipstick: NEGATIVE
Ketones, ur: NEGATIVE
Leukocytes,Ua: NEGATIVE
Nitrite: NEGATIVE
RBC / HPF: NONE SEEN (ref 0–?)
Specific Gravity, Urine: 1.025 (ref 1.000–1.030)
Urine Glucose: NEGATIVE
Urobilinogen, UA: 0.2 (ref 0.0–1.0)
pH: 6 (ref 5.0–8.0)

## 2023-04-08 LAB — LIPID PANEL
Cholesterol: 160 mg/dL (ref 0–200)
HDL: 54.5 mg/dL (ref >39.00)
LDL Cholesterol: 89 mg/dL (ref 0–99)
NonHDL: 105.04
Total CHOL/HDL Ratio: 3
Triglycerides: 82 mg/dL (ref 0.0–149.0)
VLDL: 16.4 mg/dL (ref 0.0–40.0)

## 2023-04-08 LAB — BASIC METABOLIC PANEL
BUN: 13 mg/dL (ref 6–23)
CO2: 28 meq/L (ref 19–32)
Calcium: 9.2 mg/dL (ref 8.4–10.5)
Chloride: 99 meq/L (ref 96–112)
Creatinine, Ser: 0.73 mg/dL (ref 0.40–1.50)
GFR: 89.71 mL/min (ref >60.00)
Glucose, Bld: 137 mg/dL — ABNORMAL HIGH (ref 70–99)
Potassium: 4.2 meq/L (ref 3.5–5.1)
Sodium: 136 meq/L (ref 135–145)

## 2023-04-08 LAB — HEMOGLOBIN A1C: Hgb A1c MFr Bld: 7.1 % — ABNORMAL HIGH (ref 4.6–6.5)

## 2023-04-08 MED ORDER — VALACYCLOVIR HCL 1 G PO TABS: 500.0000 mg | ORAL_TABLET | Freq: Two times a day (BID) | ORAL | 3 refills | Status: AC

## 2023-04-08 NOTE — Progress Notes (Signed)
Established Patient Office Visit   Subjective:  Patient ID: Kurt Kidd, male    DOB: 01/14/1951  Age: 73 y.o. MRN: [id number]  Chief Complaint  Patient presents with   Medical Management of Chronic Issues    6 month follow up. Pt is fasting. Pt has a kidney stone that has passed and treated at urgent care. Pt requesting Valtrex for recurrent cold sores.     HPI Encounter Diagnoses  Name Primary?   Essential hypertension Yes   Mixed hyperlipidemia    Diabetes mellitus type 2, noninsulin dependent (HCC)    Oral herpes    History of dysuria    For follow-up of the above.  Developed dysuria with hematuria and was diagnosed with a kidney stone.  He was given an antibiotic and his symptoms have resolved.  Recurrent fever blisters.  He is taking his metformin twice daily.  Compliant with metoprolol   Review of Systems  Constitutional: Negative.   HENT: Negative.    Eyes:  Negative for blurred vision, discharge and redness.  Respiratory: Negative.    Cardiovascular: Negative.   Gastrointestinal:  Negative for abdominal pain.  Genitourinary: Negative.  Negative for dysuria, frequency, hematuria and urgency.  Musculoskeletal: Negative.  Negative for myalgias.  Skin:  Negative for rash.  Neurological:  Negative for tingling, loss of consciousness and weakness.  Endo/Heme/Allergies:  Negative for polydipsia.     Current Outpatient Medications:    aspirin EC 81 MG tablet, Take 1 tablet (81 mg total) by mouth daily. Swallow whole., Disp: , Rfl:    atorvastatin (LIPITOR) 80 MG tablet, Take 1 tablet (80 mg total) by mouth daily., Disp: 90 tablet, Rfl: 3   Blood Glucose Monitoring Suppl (ONE TOUCH ULTRA SYSTEM KIT) W/DEVICE KIT, 1 kit by Does not apply route once., Disp: 1 each, Rfl: 0   esomeprazole (NEXIUM) 20 MG capsule, Take 20 mg by mouth daily before breakfast., Disp: , Rfl:    ezetimibe (ZETIA) 10 MG tablet, Take 1 tablet by mouth once daily, Disp: 90 tablet, Rfl: 0   LYSINE  PO, Take 1,000 mg by mouth 2 (two) times daily as needed (fever blisters)., Disp: , Rfl:    metFORMIN (GLUCOPHAGE-XR) 750 MG 24 hr tablet, Take 1 tablet (750 mg total) by mouth 2 (two) times daily before a meal., Disp: 60 tablet, Rfl: 3   metoprolol tartrate (LOPRESSOR) 50 MG tablet, Take 1 tablet (50 mg total) by mouth 2 (two) times daily., Disp: 180 tablet, Rfl: 1   Multiple Vitamins-Minerals (MULTIVITAMIN WITH MINERALS) tablet, Take 1 tablet by mouth in the morning., Disp: , Rfl:    olive oil (SM SWEET OIL) external oil, Apply 1-2 drops topically at bedtime. Applied to a Q-tip and applied into ears nightly, Disp: , Rfl:    tamsulosin (FLOMAX) 0.4 MG CAPS capsule, Take 1 capsule by mouth once daily, Disp: 90 capsule, Rfl: 3   valACYclovir (VALTREX) 1000 MG tablet, Take 0.5 tablets (500 mg total) by mouth 2 (two) times daily for 3 days., Disp: 9 tablet, Rfl: 3   Objective:     BP 136/78   Pulse 71   Temp 98 F (36.7 C)   Ht 5\' 6"  (1.676 m)   Wt 187 lb 12.8 oz (85.2 kg)   SpO2 97%   BMI 30.31 kg/m    Physical Exam Constitutional:      General: He is not in acute distress.    Appearance: Normal appearance. He is not ill-appearing, toxic-appearing or diaphoretic.  HENT:     Head: Normocephalic and atraumatic.     Right Ear: External ear normal.     Left Ear: External ear normal.     Mouth/Throat:     Mouth: Mucous membranes are moist.     Pharynx: Oropharynx is clear. No oropharyngeal exudate or posterior oropharyngeal erythema.  Eyes:     General: No scleral icterus.       Right eye: No discharge.        Left eye: No discharge.     Extraocular Movements: Extraocular movements intact.     Conjunctiva/sclera: Conjunctivae normal.     Pupils: Pupils are equal, round, and reactive to light.  Cardiovascular:     Rate and Rhythm: Normal rate and regular rhythm.  Pulmonary:     Effort: Pulmonary effort is normal. No respiratory distress.     Breath sounds: Normal breath sounds.   Abdominal:     General: Bowel sounds are normal.     Tenderness: There is no right CVA tenderness or left CVA tenderness.  Musculoskeletal:     Cervical back: No rigidity or tenderness.  Lymphadenopathy:     Cervical: No cervical adenopathy.  Skin:    General: Skin is warm and dry.  Neurological:     Mental Status: He is alert and oriented to person, place, and time.  Psychiatric:        Mood and Affect: Mood normal.        Behavior: Behavior normal.    Diabetic Foot Exam - Simple   Simple Foot Form Diabetic Foot exam was performed with the following findings: Yes 04/08/2023  8:47 AM  Visual Inspection See comments: Yes Sensation Testing See comments: Yes Pulse Check Posterior Tibialis and Dorsalis pulse intact bilaterally: Yes Comments Feet are cavus bilaterally.  Decreased sensation over the MTPs.  Capillary refill brisk       No results found for any visits on 04/08/23.    The ASCVD Risk score (Arnett DK, et al., 2019) failed to calculate for the following reasons:   The valid total cholesterol range is 130 to 320 mg/dL    Assessment & Plan:   Essential hypertension -     Basic metabolic panel  Mixed hyperlipidemia -     Lipid panel  Diabetes mellitus type 2, noninsulin dependent (HCC) -     Hemoglobin A1c -     Basic metabolic panel  Oral herpes -     valACYclovir HCl; Take 0.5 tablets (500 mg total) by mouth 2 (two) times daily for 3 days.  Dispense: 9 tablet; Refill: 3  History of dysuria -     Urinalysis, Routine w reflex microscopic -     Urine Culture    Return in about 6 months (around 10/06/2023), or if symptoms worsen or fail to improve.    Mliss Sax, MD

## 2023-04-08 NOTE — Progress Notes (Signed)
 [facility] [place]  ASSESSMENT / PLAN: Kurt Kidd is a 73 y.o. male status post left carotid endarterectomy 06/18/22 for asymptomatic carotid artery stenosis.   The patient should continue best medical therapy for carotid artery stenosis including: Complete cessation from all tobacco products. Blood glucose control with goal A1c < 7%. Blood pressure control with goal blood pressure < 140/90 mmHg. Lipid reduction therapy with goal LDL-C <100 mg/dL (<29 if symptomatic from carotid artery stenosis).  Aspirin  81mg  PO QD.  Atorvastatin  40-80mg  PO QD (or other high intensity statin therapy).  He has done well after endarterectomy. We will continue surveillance. He has some mild re-stenosis of his endarterectomy - either neo-intimal hyperplasia or re-accumulation of atheroma. Will monitor this with repeat duplex in 12 months.  CHIEF COMPLAINT: asymptomatic carotid stenosis  HISTORY OF PRESENT ILLNESS: Kurt Kidd is a 73 y.o. male referred to clinic to evaluate carotid artery stenosis. Dr. Mona has been following this for some time. He has no symptoms of stroke or mini-stroke. No facial droop, visual changes, no unilateral weakness. We spent the bulk of the visit reviewing the natural history of carotid artery stenosis. I explained the rationale for intervention in asymptomatic patients at 80%. We reviewed the plan for surveillance.   06/12/22: Returns to discuss CT angiogram results.  I counseled him about the worse than expected carotid stenosis in the left carotid bifurcation.  We reviewed his unique anatomy.  Ultimately I recommended a carotid endarterectomy given the low bifurcation in his neck.  Counseled him about his 1 to 2% risk of perioperative stroke, and risk of cranial nerve injury and hematoma.  07/17/22: Returns to clinic. Doing great. Some incisional numbness. I counseled him this is normal. No cranial nerve issues. No trouble speaking, swallowing or  asymmetric smile. No focal neurologic symptoms.   04/09/23: Doing well. No CVA/TIA symptoms. Reviewed his duplex. Counseled that he has some evidence of restenosis.   Past Medical History:  Diagnosis Date   Blood transfusion without reported diagnosis    CAD (coronary artery disease)    DES.. 2004 /  catheterization 2008, 10% narrowing stent site, pain not cardiac   Colitis, ischemic (HCC)    Colon polyp    Degenerative joint disease    Diabetes mellitus 2012   Diverticulosis    Dyslipidemia    Ejection fraction    65-70%, echo, March, 2012   GERD (gastroesophageal reflux disease)    Hyperlipidemia    Hypertension    Mitral regurgitation    Echo, mild, 2012   Murmur    Onychomycosis    Overweight(278.02)    Palpitations    Rapid heartbeat and shortness of breath after eating 2008 recurrent episode 2012    Past Surgical History:  Procedure Laterality Date   ANGIOPLASTY / STENTING FEMORAL  09/09/2002   CARDIAC CATHETERIZATION     COLONOSCOPY  2014   Dr Dessa   COLONOSCOPY  11/26/2007   Dr Patterson/POLYPOID COLONIC MUCOSA WITH HYPEREMIA AND   COLONOSCOPY WITH PROPOFOL  N/A 07/13/2016   Procedure: COLONOSCOPY WITH PROPOFOL ;  Surgeon: Dessa Reyes ORN, MD;  Location: [facility];  Service: Endoscopy;  Laterality: N/A;   ENDARTERECTOMY Left 06/18/2022   Procedure: ENDARTERECTOMY CAROTID WITH BOVINE PARICARDIAL PATCH.;  Surgeon: Magda Debby SAILOR, MD;  Location: [facility] OR;  Service: Vascular;  Laterality: Left;   ESOPHAGOGASTRODUODENOSCOPY (EGD) WITH PROPOFOL  N/A 07/13/2016   Procedure: ESOPHAGOGASTRODUODENOSCOPY (EGD) WITH PROPOFOL ;  Surgeon: Dessa Reyes ORN, MD;  Location: [facility];  Service: Endoscopy;  Laterality: N/A;   TOTAL ANKLE ARTHROPLASTY Left 02/25/2020   Procedure: TOTAL ANKLE ARTHROPLASTY;  Surgeon: Kit Rush, MD;  Location: [facility];  Service: Orthopedics;  Laterality: Left;   UPPER GI ENDOSCOPY  11/26/2007   Dr Jakie    Family  History  Problem Relation Age of Onset   Heart failure Mother    Heart disease Mother    Diabetes Father        father also had ASHD/ CABG   Heart disease Father    Colon cancer Brother 27   AAA (abdominal aortic aneurysm) Brother 20   Esophageal cancer Neg Hx    Rectal cancer Neg Hx    Stomach cancer Neg Hx     Social History   Socioeconomic History   Marital status: Married    Spouse name: Not on file   Number of children: Not on file   Years of education: Not on file   Highest education level: Some college, no degree  Occupational History   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    Employer: [employer]  Tobacco Use   Smoking status: Former    Current packs/day: 0.00    Average packs/day: 1 pack/day for 26.0 years (26.0 ttl pk-yrs)    Types: Cigarettes    Start date: 03/05/1964    Quit date: 03/05/1990    Years since quitting: 33.1   Smokeless tobacco: Never  Vaping Use   Vaping status: Never Used  Substance and Sexual Activity   Alcohol use: Yes    Alcohol/week: 2.0 standard drinks of alcohol    Types: 2 Glasses of wine per week    Comment: 2 drinks per week   Drug use: No   Sexual activity: Not on file  Other Topics Concern   Not on file  Social History Narrative   Not on file   Social Drivers of Health   Financial Resource Strain: Low Risk  (04/06/2023)   Overall Financial Resource Strain (CARDIA)    Difficulty of Paying Living Expenses: Not hard at all  Food Insecurity: No Food Insecurity (04/06/2023)   Hunger Vital Sign    Worried About Running Out of Food in the Last Year: Never true    Ran Out of Food in the Last Year: Never true  Transportation Needs: No Transportation Needs (04/06/2023)   PRAPARE - Administrator, Civil Service (Medical): No    Lack of Transportation (Non-Medical): No  Physical Activity: Sufficiently Active (04/06/2023)   Exercise Vital Sign    Days of Exercise per Week: 4 days    Minutes of Exercise per Session: 70 min   Recent Concern: Physical Activity - Inactive (03/25/2023)   Exercise Vital Sign    Days of Exercise per Week: 0 days    Minutes of Exercise per Session: 0 min  Stress: No Stress Concern Present (04/06/2023)   [employer] of Occupational Health - Occupational Stress Questionnaire    Feeling of Stress : Not at all  Social Connections: Moderately Isolated (04/06/2023)   Social Connection and Isolation Panel [NHANES]    Frequency of Communication with Friends and Family: More than three times a week    Frequency of Social Gatherings with Friends and Family: Twice a week    Attends Religious Services: Never    Database Administrator or Organizations: No    Attends Banker Meetings: Never    Marital Status: Married  Catering Manager Violence: Not At Risk (  03/25/2023)   Humiliation, Afraid, Rape, and Kick questionnaire    Fear of Current or Ex-Partner: No    Emotionally Abused: No    Physically Abused: No    Sexually Abused: No    No Known Allergies  Current Outpatient Medications  Medication Sig Dispense Refill   aspirin  EC 81 MG tablet Take 1 tablet (81 mg total) by mouth daily. Swallow whole.     atorvastatin  (LIPITOR ) 80 MG tablet Take 1 tablet (80 mg total) by mouth daily. 90 tablet 3   Blood Glucose Monitoring Suppl (ONE TOUCH ULTRA SYSTEM KIT) W/DEVICE KIT 1 kit by Does not apply route once. 1 each 0   esomeprazole  (NEXIUM ) 20 MG capsule Take 20 mg by mouth daily before breakfast.     ezetimibe  (ZETIA ) 10 MG tablet Take 1 tablet by mouth once daily 90 tablet 0   LYSINE  PO Take 1,000 mg by mouth 2 (two) times daily as needed (fever blisters).     metFORMIN  (GLUCOPHAGE -XR) 750 MG 24 hr tablet Take 1 tablet (750 mg total) by mouth 2 (two) times daily before a meal. 60 tablet 3   metoprolol  tartrate (LOPRESSOR ) 50 MG tablet Take 1 tablet (50 mg total) by mouth 2 (two) times daily. 180 tablet 1   Multiple Vitamins-Minerals (MULTIVITAMIN WITH MINERALS) tablet Take 1 tablet  by mouth in the morning.     olive oil (SM SWEET OIL) external oil Apply 1-2 drops topically at bedtime. Applied to a Q-tip and applied into ears nightly     tamsulosin  (FLOMAX ) 0.4 MG CAPS capsule Take 1 capsule by mouth once daily 90 capsule 3   No current facility-administered medications for this visit.    PHYSICAL EXAM Vitals:   04/09/23 0855  BP: 136/77  Pulse: 64  Resp: 20  Temp: 97.9 F (36.6 C)  SpO2: 94%  Weight: 184 lb 12.8 oz (83.8 kg)  Height: 5' 6 (1.676 m)    Well appearing man in no distress Regular rate and rhythm Unlabored breathing L neck healed  PERTINENT LABORATORY AND RADIOLOGIC DATA  Most recent CBC    Latest Ref Rng & Units 10/29/2022    9:18 AM 06/19/2022    3:25 AM 06/18/2022    5:53 AM  CBC  WBC 4.0 - 10.5 K/uL 7.2  12.5  7.9   Hemoglobin 13.0 - 17.0 g/dL 86.3  87.4  85.9   Hematocrit 39.0 - 52.0 % 40.7  35.8  40.2   Platelets 150.0 - 400.0 K/uL 288.0  238  254      Most recent CMP    Latest Ref Rng & Units 10/29/2022    9:18 AM 06/19/2022    3:25 AM 06/18/2022    5:53 AM  CMP  Glucose 70 - 99 mg/dL 860  854  843   BUN 6 - 23 mg/dL 13  15  16    Creatinine 0.40 - 1.50 mg/dL 9.28  9.32  9.21   Sodium 135 - 145 mEq/L 137  132  140   Potassium 3.5 - 5.1 mEq/L 4.4  4.0  4.5   Chloride 96 - 112 mEq/L 99  98  103   CO2 19 - 32 mEq/L 28  24  27    Calcium  8.4 - 10.5 mg/dL 9.4  8.8  9.5   Total Protein 6.0 - 8.3 g/dL 6.5   6.8   Total Bilirubin 0.2 - 1.2 mg/dL 0.5   0.8   Alkaline Phos 39 - 117 U/L 72  74   AST 0 - 37 U/L 22   30   ALT 0 - 53 U/L 27   36     Renal function CrCl cannot be calculated (Patient's most recent lab result is older than the maximum 21 days allowed.).  Hgb A1c MFr Bld (%)  Date Value  10/29/2022 7.2 (H)    LDL Cholesterol  Date Value Ref Range Status  04/08/2023 89 0 - 99 mg/dL Final   Direct LDL  Date Value Ref Range Status  10/17/2020 82.0 mg/dL Final    Comment:    Optimal:  <100 mg/dLNear or Above  Optimal:  100-129 mg/dLBorderline High:  130-159 mg/dLHigh:  160-189 mg/dLVery High:  >190 mg/dL    Carotid duplex 09/06/72  Right Carotid: Velocities in the right ICA are consistent with a 40-59%                 stenosis.   Left Carotid: Velocities in the left ICA are consistent with a 40-59%  stenosis.   Vertebrals: Bilateral vertebral arteries demonstrate antegrade flow.   Debby SAILOR. Magda, MD FACS [facility] Phone Number: [phone] 04/08/2023 7:33 AM   Total time spent on preparing this encounter including chart review, data review, collecting history, examining the patient, coordinating care for this established patient, 20 minutes.   Portions of this report may have been transcribed using voice recognition software.  Every effort has been made to ensure accuracy; however, inadvertent computerized transcription errors may still be present.

## 2023-04-09 ENCOUNTER — Ambulatory Visit (HOSPITAL_COMMUNITY)
Admission: RE | Admit: 2023-04-09 | Discharge: 2023-04-09 | Disposition: A | Discharge status: Home / Self Care | Payer: PPO | Source: Ambulatory Visit | Attending: Vascular Surgery | Admitting: Vascular Surgery

## 2023-04-09 ENCOUNTER — Ambulatory Visit: Payer: PPO | Admitting: Vascular Surgery

## 2023-04-09 ENCOUNTER — Encounter: Payer: Self-pay | Admitting: Vascular Surgery

## 2023-04-09 VITALS — BP 136/77 | HR 64 | Temp 97.9°F | Resp 20 | Ht 66.0 in | Wt 184.8 lb

## 2023-04-09 DIAGNOSIS — I6523 Occlusion and stenosis of bilateral carotid arteries: Secondary | ICD-10-CM

## 2023-04-09 DIAGNOSIS — Z9889 Other specified postprocedural states: Secondary | ICD-10-CM

## 2023-04-09 LAB — URINE CULTURE
MICRO NUMBER:: 16032842
Result:: NO GROWTH
SPECIMEN QUALITY:: ADEQUATE

## 2023-04-19 ENCOUNTER — Other Ambulatory Visit: Payer: Self-pay

## 2023-04-19 DIAGNOSIS — I6523 Occlusion and stenosis of bilateral carotid arteries: Secondary | ICD-10-CM

## 2023-04-23 ENCOUNTER — Other Ambulatory Visit: Payer: Self-pay

## 2023-04-23 ENCOUNTER — Other Ambulatory Visit: Payer: Self-pay | Admitting: Family Medicine

## 2023-04-23 DIAGNOSIS — E119 Type 2 diabetes mellitus without complications: Secondary | ICD-10-CM

## 2023-06-03 DIAGNOSIS — L821 Other seborrheic keratosis: Secondary | ICD-10-CM | POA: Diagnosis not present

## 2023-06-03 DIAGNOSIS — L814 Other melanin hyperpigmentation: Secondary | ICD-10-CM | POA: Diagnosis not present

## 2023-06-03 DIAGNOSIS — Z08 Encounter for follow-up examination after completed treatment for malignant neoplasm: Secondary | ICD-10-CM | POA: Diagnosis not present

## 2023-06-03 DIAGNOSIS — D2371 Other benign neoplasm of skin of right lower limb, including hip: Secondary | ICD-10-CM | POA: Diagnosis not present

## 2023-06-03 DIAGNOSIS — D2372 Other benign neoplasm of skin of left lower limb, including hip: Secondary | ICD-10-CM | POA: Diagnosis not present

## 2023-06-03 DIAGNOSIS — Z86007 Personal history of in-situ neoplasm of skin: Secondary | ICD-10-CM | POA: Diagnosis not present

## 2023-06-03 DIAGNOSIS — Z7189 Other specified counseling: Secondary | ICD-10-CM | POA: Diagnosis not present

## 2023-06-21 ENCOUNTER — Other Ambulatory Visit: Payer: Self-pay | Admitting: Family Medicine

## 2023-06-21 DIAGNOSIS — I10 Essential (primary) hypertension: Secondary | ICD-10-CM

## 2023-07-24 ENCOUNTER — Other Ambulatory Visit: Payer: Self-pay | Admitting: Family Medicine

## 2023-10-07 DIAGNOSIS — H5203 Hypermetropia, bilateral: Secondary | ICD-10-CM | POA: Diagnosis not present

## 2023-10-07 DIAGNOSIS — H52203 Unspecified astigmatism, bilateral: Secondary | ICD-10-CM | POA: Diagnosis not present

## 2023-10-07 DIAGNOSIS — E119 Type 2 diabetes mellitus without complications: Secondary | ICD-10-CM | POA: Diagnosis not present

## 2023-10-07 LAB — HM DIABETES EYE EXAM

## 2023-10-08 ENCOUNTER — Encounter: Payer: Self-pay | Admitting: Family Medicine

## 2023-10-30 ENCOUNTER — Other Ambulatory Visit: Payer: Self-pay | Admitting: Family Medicine

## 2023-10-30 DIAGNOSIS — I10 Essential (primary) hypertension: Secondary | ICD-10-CM

## 2023-10-30 DIAGNOSIS — E119 Type 2 diabetes mellitus without complications: Secondary | ICD-10-CM

## 2023-10-30 NOTE — Telephone Encounter (Signed)
 Called patient and left a detailed voice message per DPR on file asking to give the office a call that we received a refill request for Metformin  750 mg 2 times a day and that I need confirmation for which Metformin  he is currently taking because there are 2 different doses listed on his medication list. Asked to give the office a call back at [phone]

## 2023-11-01 ENCOUNTER — Telehealth: Payer: Self-pay | Admitting: Family Medicine

## 2023-11-01 DIAGNOSIS — E119 Type 2 diabetes mellitus without complications: Secondary | ICD-10-CM

## 2023-11-01 NOTE — Telephone Encounter (Signed)
 Copied from CRM #[id number]. Topic: Clinical - Medication Refill >> Nov 01, 2023  2:17 PM Paige D wrote: Medication:  metFORMIN  (GLUCOPHAGE -XR) 750 MG 24 hr tablet Has the patient contacted their pharmacy? Yes (Agent: If no, request that the patient contact the pharmacy for the refill. If patient does not wish to contact the pharmacy document the reason why and proceed with request.) (Agent: If yes, when and what did the pharmacy advise?)  This is the patient's preferred pharmacy:  [facility] [address] ([address] [place] - [address] [place] ([place]) [place] [address] Phone: [phone] Fax: [phone]  Is this the correct pharmacy for this prescription? Yes If no, delete pharmacy and type the correct one.   Has the prescription been filled recently? Yes   Is the patient out of the medication? Yes  Has the patient been seen for an appointment in the last year OR does the patient have an upcoming appointment? Yes  Can we respond through MyChart? Yes  Agent: Please be advised that Rx refills may take up to 3 business days. We ask that you follow-up with your pharmacy.

## 2023-11-05 ENCOUNTER — Other Ambulatory Visit: Payer: Self-pay | Admitting: Family Medicine

## 2023-11-05 DIAGNOSIS — N401 Enlarged prostate with lower urinary tract symptoms: Secondary | ICD-10-CM

## 2023-11-15 ENCOUNTER — Other Ambulatory Visit: Payer: Self-pay | Admitting: Family Medicine

## 2023-11-15 DIAGNOSIS — E782 Mixed hyperlipidemia: Secondary | ICD-10-CM

## 2023-11-24 ENCOUNTER — Other Ambulatory Visit: Payer: Self-pay | Admitting: Family Medicine

## 2023-11-24 DIAGNOSIS — I10 Essential (primary) hypertension: Secondary | ICD-10-CM

## 2023-12-05 ENCOUNTER — Ambulatory Visit: Admitting: Family Medicine

## 2023-12-05 ENCOUNTER — Encounter: Payer: Self-pay | Admitting: Family Medicine

## 2023-12-05 VITALS — BP 136/78 | HR 111 | Temp 97.6°F | Ht 66.0 in | Wt 189.0 lb

## 2023-12-05 DIAGNOSIS — I10 Essential (primary) hypertension: Secondary | ICD-10-CM | POA: Diagnosis not present

## 2023-12-05 DIAGNOSIS — E538 Deficiency of other specified B group vitamins: Secondary | ICD-10-CM

## 2023-12-05 DIAGNOSIS — Z7984 Long term (current) use of oral hypoglycemic drugs: Secondary | ICD-10-CM

## 2023-12-05 DIAGNOSIS — N401 Enlarged prostate with lower urinary tract symptoms: Secondary | ICD-10-CM

## 2023-12-05 DIAGNOSIS — Z23 Encounter for immunization: Secondary | ICD-10-CM

## 2023-12-05 DIAGNOSIS — E119 Type 2 diabetes mellitus without complications: Secondary | ICD-10-CM | POA: Diagnosis not present

## 2023-12-05 DIAGNOSIS — E782 Mixed hyperlipidemia: Secondary | ICD-10-CM | POA: Diagnosis not present

## 2023-12-05 DIAGNOSIS — R3911 Hesitancy of micturition: Secondary | ICD-10-CM

## 2023-12-05 LAB — COMPREHENSIVE METABOLIC PANEL WITH GFR
ALT: 30 U/L (ref 0–53)
AST: 22 U/L (ref 0–37)
Albumin: 4.7 g/dL (ref 3.5–5.2)
Alkaline Phosphatase: 70 U/L (ref 39–117)
BUN: 11 mg/dL (ref 6–23)
CO2: 31 meq/L (ref 19–32)
Calcium: 9.9 mg/dL (ref 8.4–10.5)
Chloride: 100 meq/L (ref 96–112)
Creatinine, Ser: 0.6 mg/dL (ref 0.40–1.50)
GFR: 94.74 mL/min (ref >60.00)
Glucose, Bld: 112 mg/dL — ABNORMAL HIGH (ref 70–99)
Potassium: 4.4 meq/L (ref 3.5–5.1)
Sodium: 140 meq/L (ref 135–145)
Total Bilirubin: 0.7 mg/dL (ref 0.2–1.2)
Total Protein: 7 g/dL (ref 6.0–8.3)

## 2023-12-05 LAB — MICROALBUMIN / CREATININE URINE RATIO
Creatinine,U: 69 mg/dL
Microalb Creat Ratio: 89.7 mg/g — ABNORMAL HIGH (ref 0.0–30.0)
Microalb, Ur: 6.2 mg/dL — ABNORMAL HIGH (ref 0.0–1.9)

## 2023-12-05 LAB — PSA: PSA: 3.44 ng/mL (ref 0.10–4.00)

## 2023-12-05 LAB — LIPID PANEL
Cholesterol: 172 mg/dL (ref 0–200)
HDL: 46.4 mg/dL (ref >39.00)
LDL Cholesterol: 96 mg/dL (ref 0–99)
NonHDL: 125.24
Total CHOL/HDL Ratio: 4
Triglycerides: 144 mg/dL (ref 0.0–149.0)
VLDL: 28.8 mg/dL (ref 0.0–40.0)

## 2023-12-05 LAB — HEMOGLOBIN A1C: Hgb A1c MFr Bld: 7.1 % — ABNORMAL HIGH (ref 4.6–6.5)

## 2023-12-05 LAB — VITAMIN B12: Vitamin B-12: 297 pg/mL (ref 211–911)

## 2023-12-05 MED ORDER — METOPROLOL TARTRATE 50 MG PO TABS: 50.0000 mg | ORAL_TABLET | Freq: Two times a day (BID) | ORAL | 0 refills | Status: DC

## 2023-12-05 MED ORDER — METOPROLOL TARTRATE 50 MG PO TABS: 50.0000 mg | ORAL_TABLET | Freq: Two times a day (BID) | ORAL | 2 refills | Status: AC

## 2023-12-05 MED ORDER — METFORMIN HCL ER 750 MG PO TB24: 750.0000 mg | ORAL_TABLET | Freq: Two times a day (BID) | ORAL | 0 refills | Status: DC

## 2023-12-05 MED ORDER — FINASTERIDE 5 MG PO TABS: 5.0000 mg | ORAL_TABLET | Freq: Every day | ORAL | 1 refills | Status: DC

## 2023-12-05 MED ORDER — TAMSULOSIN HCL 0.4 MG PO CAPS
0.4000 mg | ORAL_CAPSULE | Freq: Every day | ORAL | 0 refills | Status: AC
Start: 2023-12-05 — End: ?

## 2023-12-05 NOTE — Progress Notes (Addendum)
 Established Patient Office Visit   Subjective:  Patient ID: Kurt Kidd, male    DOB: Jul 09, 1950  Age: 73 y.o. MRN: [id number]  Chief Complaint  Patient presents with   Medical Management of Chronic Issues    Follow up. Pt is fasting. Pt complains of frequent urination and wants prostate checked.     HPI Encounter Diagnoses  Name Primary?   Mixed hyperlipidemia Yes   Essential hypertension    Benign prostatic hyperplasia with urinary hesitancy    Diabetes mellitus type 2, noninsulin dependent (HCC)    Immunization due    B12 deficiency    For follow-up of above.  Blood pressure well-controlled with metoprolol .  Denies issues taking the medication.  He is having increasing issues with decreased force of his stream, pre and postvoid dribbling, incomplete emptying, nocturia.  Libido is good.  He has no issues with achieving and correction.  He has lost his erection during intercourse a few times.  He has been compliant with his atorvastatin  and Zetia .   Review of Systems  Constitutional: Negative.   HENT: Negative.    Eyes:  Negative for blurred vision, discharge and redness.  Respiratory: Negative.    Cardiovascular: Negative.   Gastrointestinal:  Negative for abdominal pain.  Genitourinary: Negative.   Musculoskeletal: Negative.  Negative for myalgias.  Skin:  Negative for rash.  Neurological:  Negative for tingling, loss of consciousness and weakness.  Endo/Heme/Allergies:  Negative for polydipsia.      12/05/2023   11:15 AM 03/25/2023    8:17 AM 10/29/2022    8:37 AM  Depression screen PHQ 2/9  Decreased Interest 0 0 0  Down, Depressed, Hopeless 0 0 0  PHQ - 2 Score 0 0 0  Altered sleeping 0    Tired, decreased energy 0    Change in appetite 0    Feeling bad or failure about yourself  0    Trouble concentrating 0    Moving slowly or fidgety/restless 0    Suicidal thoughts 0    PHQ-9 Score 0    Difficult doing work/chores Not difficult at all         Current Outpatient Medications:    atorvastatin  (LIPITOR ) 80 MG tablet, Take 1 tablet by mouth once daily, Disp: 90 tablet, Rfl: 0   Blood Glucose Monitoring Suppl (ONE TOUCH ULTRA SYSTEM KIT) W/DEVICE KIT, 1 kit by Does not apply route once., Disp: 1 each, Rfl: 0   esomeprazole  (NEXIUM ) 20 MG capsule, Take 20 mg by mouth daily before breakfast., Disp: , Rfl:    ezetimibe  (ZETIA ) 10 MG tablet, Take 1 tablet by mouth once daily, Disp: 90 tablet, Rfl: 0   finasteride (PROSCAR) 5 MG tablet, Take 1 tablet (5 mg total) by mouth daily., Disp: 90 tablet, Rfl: 1   LYSINE  PO, Take 1,000 mg by mouth 2 (two) times daily as needed (fever blisters)., Disp: , Rfl:    Multiple Vitamins-Minerals (MULTIVITAMIN WITH MINERALS) tablet, Take 1 tablet by mouth in the morning., Disp: , Rfl:    olive oil (SM SWEET OIL) external oil, Apply 1-2 drops topically at bedtime. Applied to a Q-tip and applied into ears nightly, Disp: , Rfl:    metFORMIN  (GLUCOPHAGE -XR) 750 MG 24 hr tablet, Take 1 tablet (750 mg total) by mouth 2 (two) times daily before a meal., Disp: 60 tablet, Rfl: 0   metoprolol  tartrate (LOPRESSOR ) 50 MG tablet, Take 1 tablet (50 mg total) by mouth 2 (two) times daily., Disp: 180  tablet, Rfl: 2   tamsulosin  (FLOMAX ) 0.4 MG CAPS capsule, Take 1 capsule (0.4 mg total) by mouth daily., Disp: 90 capsule, Rfl: 0   Objective:     BP 136/78 (BP Location: Right Arm, Cuff Size: Normal)   Pulse (!) 111   Temp 97.6 F (36.4 C) (Temporal)   Ht 5' 6 (1.676 m)   Wt 189 lb (85.7 kg)   SpO2 97%   BMI 30.51 kg/m    Physical Exam Constitutional:      General: He is not in acute distress.    Appearance: Normal appearance. He is not ill-appearing, toxic-appearing or diaphoretic.  HENT:     Head: Normocephalic and atraumatic.     Right Ear: Tympanic membrane, ear canal and external ear normal.     Left Ear: Tympanic membrane, ear canal and external ear normal.     Mouth/Throat:     Mouth: Mucous  membranes are moist.     Pharynx: Oropharynx is clear. No oropharyngeal exudate or posterior oropharyngeal erythema.  Eyes:     General: No scleral icterus.       Right eye: No discharge.        Left eye: No discharge.     Extraocular Movements: Extraocular movements intact.     Conjunctiva/sclera: Conjunctivae normal.     Pupils: Pupils are equal, round, and reactive to light.  Cardiovascular:     Rate and Rhythm: Normal rate and regular rhythm.  Pulmonary:     Effort: Pulmonary effort is normal. No respiratory distress.     Breath sounds: Normal breath sounds.  Abdominal:     General: Bowel sounds are normal.     Tenderness: There is no abdominal tenderness. There is no guarding or rebound.  Musculoskeletal:     Cervical back: No rigidity or tenderness.  Lymphadenopathy:     Cervical: No cervical adenopathy.  Skin:    General: Skin is warm and dry.  Neurological:     Mental Status: He is alert and oriented to person, place, and time.  Psychiatric:        Mood and Affect: Mood normal.        Behavior: Behavior normal.      No results found for any visits on 12/05/23.    The 10-year ASCVD risk score (Arnett DK, et al., 2019) is: 47.5%    Assessment & Plan:   Mixed hyperlipidemia -     Comprehensive metabolic panel with GFR -     Lipid panel  Essential hypertension -     Comprehensive metabolic panel with GFR -     Metoprolol  Tartrate; Take 1 tablet (50 mg total) by mouth 2 (two) times daily.  Dispense: 180 tablet; Refill: 2  Benign prostatic hyperplasia with urinary hesitancy -     PSA -     Finasteride; Take 1 tablet (5 mg total) by mouth daily.  Dispense: 90 tablet; Refill: 1 -     Tamsulosin  HCl; Take 1 capsule (0.4 mg total) by mouth daily.  Dispense: 90 capsule; Refill: 0  Diabetes mellitus type 2, noninsulin dependent (HCC) -     Comprehensive metabolic panel with GFR -     Hemoglobin A1c -     Microalbumin / creatinine urine ratio -     metFORMIN  HCl  ER; Take 1 tablet (750 mg total) by mouth 2 (two) times daily before a meal.  Dispense: 60 tablet; Refill: 0  Immunization due -     Flu vaccine HIGH  DOSE PF(Fluzone Trivalent)  B12 deficiency -     Vitamin B12    Return in about 3 months (around 03/06/2024).  Patient would like to try Proscar first before being referred to urology to discuss available procedures.  Information on Proscar was given.  He knows that it will take a month or 2 for results.  Will continue Flomax  in the meantime.  Elsie Sim Lent, MD

## 2023-12-06 ENCOUNTER — Ambulatory Visit: Payer: Self-pay | Admitting: Family Medicine

## 2024-01-16 ENCOUNTER — Other Ambulatory Visit: Payer: Self-pay | Admitting: Family Medicine

## 2024-01-16 DIAGNOSIS — E119 Type 2 diabetes mellitus without complications: Secondary | ICD-10-CM

## 2024-02-04 DIAGNOSIS — D1801 Hemangioma of skin and subcutaneous tissue: Secondary | ICD-10-CM | POA: Diagnosis not present

## 2024-02-04 DIAGNOSIS — Z86007 Personal history of in-situ neoplasm of skin: Secondary | ICD-10-CM | POA: Diagnosis not present

## 2024-02-04 DIAGNOSIS — L821 Other seborrheic keratosis: Secondary | ICD-10-CM | POA: Diagnosis not present

## 2024-02-04 DIAGNOSIS — Z85828 Personal history of other malignant neoplasm of skin: Secondary | ICD-10-CM | POA: Diagnosis not present

## 2024-02-04 DIAGNOSIS — Z08 Encounter for follow-up examination after completed treatment for malignant neoplasm: Secondary | ICD-10-CM | POA: Diagnosis not present

## 2024-02-04 DIAGNOSIS — L814 Other melanin hyperpigmentation: Secondary | ICD-10-CM | POA: Diagnosis not present

## 2024-02-04 DIAGNOSIS — L570 Actinic keratosis: Secondary | ICD-10-CM | POA: Diagnosis not present

## 2024-02-12 ENCOUNTER — Other Ambulatory Visit: Payer: Self-pay | Admitting: Family Medicine

## 2024-02-12 DIAGNOSIS — E782 Mixed hyperlipidemia: Secondary | ICD-10-CM

## 2024-02-17 ENCOUNTER — Telehealth: Payer: Self-pay

## 2024-02-17 NOTE — Progress Notes (Signed)
° °  02/17/2024  Patient ID: Kurt Kidd, male   DOB: 05-29-1950, 73 y.o.   MRN: [id number]  This patient is appearing on a report for being at risk of failing the adherence measure for diabetes medications this calendar year.   Medication: metformin  xr 750mg  Last fill date: 01/17/24 for 30 day supply  Insurance report was not up to date. No action needed at this time.   Channing DELENA Mealing, PharmD, DPLA

## 2024-02-28 ENCOUNTER — Other Ambulatory Visit: Payer: Self-pay | Admitting: Family Medicine

## 2024-02-28 DIAGNOSIS — E119 Type 2 diabetes mellitus without complications: Secondary | ICD-10-CM

## 2024-03-16 ENCOUNTER — Ambulatory Visit: Admitting: Family Medicine

## 2024-03-16 ENCOUNTER — Encounter: Payer: Self-pay | Admitting: Family Medicine

## 2024-03-16 VITALS — BP 137/78 | HR 80 | Temp 97.7°F | Resp 16 | Ht 66.0 in | Wt 182.0 lb

## 2024-03-16 DIAGNOSIS — I2583 Coronary atherosclerosis due to lipid rich plaque: Secondary | ICD-10-CM

## 2024-03-16 DIAGNOSIS — E538 Deficiency of other specified B group vitamins: Secondary | ICD-10-CM | POA: Diagnosis not present

## 2024-03-16 DIAGNOSIS — E782 Mixed hyperlipidemia: Secondary | ICD-10-CM | POA: Diagnosis not present

## 2024-03-16 DIAGNOSIS — Z7984 Long term (current) use of oral hypoglycemic drugs: Secondary | ICD-10-CM | POA: Diagnosis not present

## 2024-03-16 DIAGNOSIS — I2510 Atherosclerotic heart disease of native coronary artery without angina pectoris: Secondary | ICD-10-CM | POA: Diagnosis not present

## 2024-03-16 DIAGNOSIS — I10 Essential (primary) hypertension: Secondary | ICD-10-CM

## 2024-03-16 DIAGNOSIS — R251 Tremor, unspecified: Secondary | ICD-10-CM

## 2024-03-16 DIAGNOSIS — E119 Type 2 diabetes mellitus without complications: Secondary | ICD-10-CM

## 2024-03-16 LAB — CBC
HCT: 41 % (ref 39.0–52.0)
Hemoglobin: 14.2 g/dL (ref 13.0–17.0)
MCHC: 34.5 g/dL (ref 30.0–36.0)
MCV: 86 fl (ref 78.0–100.0)
Platelets: 329 K/uL (ref 150.0–400.0)
RBC: 4.77 Mil/uL (ref 4.22–5.81)
RDW: 14.4 % (ref 11.5–15.5)
WBC: 8.3 K/uL (ref 4.0–10.5)

## 2024-03-16 LAB — COMPREHENSIVE METABOLIC PANEL WITH GFR
ALT: 43 U/L (ref 3–53)
AST: 21 U/L (ref 5–37)
Albumin: 4.4 g/dL (ref 3.5–5.2)
Alkaline Phosphatase: 66 U/L (ref 39–117)
BUN: 13 mg/dL (ref 6–23)
CO2: 27 meq/L (ref 19–32)
Calcium: 9.3 mg/dL (ref 8.4–10.5)
Chloride: 100 meq/L (ref 96–112)
Creatinine, Ser: 0.71 mg/dL (ref 0.40–1.50)
GFR: 89.87 mL/min
Glucose, Bld: 137 mg/dL — ABNORMAL HIGH (ref 70–99)
Potassium: 4.2 meq/L (ref 3.5–5.1)
Sodium: 136 meq/L (ref 135–145)
Total Bilirubin: 0.5 mg/dL (ref 0.2–1.2)
Total Protein: 7 g/dL (ref 6.0–8.3)

## 2024-03-16 LAB — LIPID PANEL
Cholesterol: 165 mg/dL (ref 28–200)
HDL: 54.6 mg/dL
LDL Cholesterol: 95 mg/dL (ref 10–99)
NonHDL: 110.05
Total CHOL/HDL Ratio: 3
Triglycerides: 77 mg/dL (ref 10.0–149.0)
VLDL: 15.4 mg/dL (ref 0.0–40.0)

## 2024-03-16 LAB — VITAMIN B12: Vitamin B-12: 319 pg/mL (ref 211–911)

## 2024-03-16 LAB — HEMOGLOBIN A1C: Hgb A1c MFr Bld: 7.1 % — ABNORMAL HIGH (ref 4.6–6.5)

## 2024-03-16 MED ORDER — LISINOPRIL 10 MG PO TABS: 10.0000 mg | ORAL_TABLET | Freq: Every day | ORAL | 3 refills | Status: AC

## 2024-03-16 NOTE — Progress Notes (Signed)
 "  Established Patient Office Visit   Subjective:  Patient ID: Kurt Kidd, male    DOB: 26-Sep-1950  Age: 74 y.o. MRN: [id number]  Chief Complaint  Patient presents with   Hypertension    Here for follow up   Diabetes    Here for follow up   Hyperlipidemia    Hypertension Pertinent negatives include no blurred vision.  Diabetes Hypoglycemia symptoms include tremors. Pertinent negatives for diabetes include no blurred vision, no polydipsia and no weakness.  Hyperlipidemia Pertinent negatives include no myalgias.   Encounter Diagnoses  Name Primary?   Diabetes mellitus type 2, noninsulin dependent (HCC) Yes   Mixed hyperlipidemia    Essential hypertension    B12 deficiency    Tremor of both hands    Follow-up of above.  A1c has been in the lower sevens.  He continues with metformin  750 twice daily.  Continues metoprolol  for hypertension and history of coronary artery disease.  Blood pressures have been running in the upper 130s over 70s.  Continues atorvastatin  with Setia for elevated LDL cholesterol history of coronary artery disease.  Multivitamin for B12 deficiency.  Has noticed a tremor in both of his hands.     Review of Systems  Constitutional: Negative.   HENT: Negative.    Eyes:  Negative for blurred vision, discharge and redness.  Respiratory: Negative.    Cardiovascular: Negative.   Gastrointestinal:  Negative for abdominal pain.  Genitourinary: Negative.   Musculoskeletal: Negative.  Negative for myalgias.  Skin:  Negative for rash.  Neurological:  Positive for tremors. Negative for tingling, loss of consciousness and weakness.  Endo/Heme/Allergies:  Negative for polydipsia.    Current Medications[1]   Objective:     BP 137/78 (BP Location: Left Arm, Patient Position: Sitting)   Pulse 80   Temp 97.7 F (36.5 C) (Oral)   Resp 16   Ht 5' 6 (1.676 m)   Wt 182 lb (82.6 kg)   SpO2 98%   BMI 29.38 kg/m  BP Readings from Last 3 Encounters:  03/16/24  137/78  12/05/23 136/78  04/09/23 136/77   Wt Readings from Last 3 Encounters:  03/16/24 182 lb (82.6 kg)  12/05/23 189 lb (85.7 kg)  04/09/23 184 lb 12.8 oz (83.8 kg)      Physical Exam Constitutional:      General: He is not in acute distress.    Appearance: Normal appearance. He is not ill-appearing, toxic-appearing or diaphoretic.  HENT:     Head: Normocephalic and atraumatic.     Right Ear: External ear normal.     Left Ear: External ear normal.  Eyes:     General: No scleral icterus.       Right eye: No discharge.        Left eye: No discharge.     Extraocular Movements: Extraocular movements intact.     Conjunctiva/sclera: Conjunctivae normal.  Cardiovascular:     Rate and Rhythm: Normal rate and regular rhythm.     Heart sounds: Murmur heard.     Systolic murmur is present with a grade of 1/6.  Pulmonary:     Effort: Pulmonary effort is normal. No respiratory distress.     Breath sounds: Normal breath sounds. No wheezing or rales.  Abdominal:     General: Bowel sounds are normal.  Musculoskeletal:     Cervical back: No rigidity or tenderness.  Skin:    General: Skin is warm and dry.  Neurological:     Mental  Status: He is alert and oriented to person, place, and time.  Psychiatric:        Mood and Affect: Mood normal.        Behavior: Behavior normal.      No results found for any visits on 03/16/24.    The 10-year ASCVD risk score (Arnett DK, et al., 2019) is: 50.8%    Assessment & Plan:   Diabetes mellitus type 2, noninsulin dependent (HCC) -     Comprehensive metabolic panel with GFR -     Hemoglobin A1c -     Lisinopril ; Take 1 tablet (10 mg total) by mouth daily.  Dispense: 90 tablet; Refill: 3  Mixed hyperlipidemia -     Comprehensive metabolic panel with GFR -     Lipid panel  Essential hypertension -     CBC -     Comprehensive metabolic panel with GFR -     Lisinopril ; Take 1 tablet (10 mg total) by mouth daily.  Dispense: 90  tablet; Refill: 3  B12 deficiency -     Vitamin B12  Tremor of both hands    Return in about 6 months (around 09/13/2024).  Have added lisinopril  for improved blood pressure control and renal protection.  May need to increase metformin  to thousand twice daily.  Small murmur noted on exam today.  History of mitral regurgitation.  Tremor is bilateral and appears benign.  Elsie Sim Lent, MD    [1]  Current Outpatient Medications:    atorvastatin  (LIPITOR ) 80 MG tablet, Take 1 tablet by mouth once daily, Disp: 90 tablet, Rfl: 1   Blood Glucose Monitoring Suppl (ONE TOUCH ULTRA SYSTEM KIT) W/DEVICE KIT, 1 kit by Does not apply route once., Disp: 1 each, Rfl: 0   esomeprazole  (NEXIUM ) 20 MG capsule, Take 20 mg by mouth daily before breakfast., Disp: , Rfl:    ezetimibe  (ZETIA ) 10 MG tablet, Take 1 tablet by mouth once daily, Disp: 90 tablet, Rfl: 0   finasteride  (PROSCAR ) 5 MG tablet, Take 1 tablet (5 mg total) by mouth daily., Disp: 90 tablet, Rfl: 1   lisinopril  (ZESTRIL ) 10 MG tablet, Take 1 tablet (10 mg total) by mouth daily., Disp: 90 tablet, Rfl: 3   metFORMIN  (GLUCOPHAGE -XR) 750 MG 24 hr tablet, TAKE 1 TABLET BY MOUTH TWICE DAILY BEFORE A MEAL, Disp: 60 tablet, Rfl: 0   metoprolol  tartrate (LOPRESSOR ) 50 MG tablet, Take 1 tablet (50 mg total) by mouth 2 (two) times daily., Disp: 180 tablet, Rfl: 2   Multiple Vitamins-Minerals (MULTIVITAMIN WITH MINERALS) tablet, Take 1 tablet by mouth in the morning., Disp: , Rfl:    tamsulosin  (FLOMAX ) 0.4 MG CAPS capsule, Take 1 capsule (0.4 mg total) by mouth daily., Disp: 90 capsule, Rfl: 0  "

## 2024-03-17 ENCOUNTER — Ambulatory Visit: Payer: Self-pay | Admitting: Family Medicine

## 2024-03-17 MED ORDER — METFORMIN HCL ER 500 MG PO TB24: 1000.0000 mg | ORAL_TABLET | Freq: Two times a day (BID) | ORAL | 1 refills | Status: AC

## 2024-03-17 NOTE — Telephone Encounter (Signed)
Patient has been notified directly; all questions, if any, were answered. Patient voiced understanding.   

## 2024-03-17 NOTE — Addendum Note (Signed)
 Addended by: BERNETA ELSIE LABOR on: 03/17/2024 07:32 AM   Modules accepted: Orders

## 2024-03-27 ENCOUNTER — Ambulatory Visit: Payer: PPO

## 2024-03-30 ENCOUNTER — Ambulatory Visit: Payer: PPO

## 2024-03-31 ENCOUNTER — Other Ambulatory Visit: Payer: Self-pay | Admitting: Family Medicine

## 2024-03-31 DIAGNOSIS — N401 Enlarged prostate with lower urinary tract symptoms: Secondary | ICD-10-CM

## 2024-03-31 NOTE — Telephone Encounter (Signed)
 Requesting: Finasteride  5 MG Oral Tablet  Last Visit: 03/16/2024 Next Visit: 09/14/2024 Last Refill: 12/05/2023  Please Advise

## 2024-09-14 ENCOUNTER — Ambulatory Visit: Admitting: Family Medicine
